# Patient Record
Sex: Female | Born: 1945 | Race: White | Hispanic: No | Marital: Single | Smoking: Former smoker
Health system: Southern US, Community
[De-identification: ages and names within clinical notes are randomized; demographics above are authoritative.]

## PROBLEM LIST (undated history)

## (undated) DIAGNOSIS — I2510 Atherosclerotic heart disease of native coronary artery without angina pectoris: Secondary | ICD-10-CM

## (undated) DIAGNOSIS — J449 Chronic obstructive pulmonary disease, unspecified: Secondary | ICD-10-CM

## (undated) DIAGNOSIS — R011 Cardiac murmur, unspecified: Secondary | ICD-10-CM

## (undated) DIAGNOSIS — I639 Cerebral infarction, unspecified: Secondary | ICD-10-CM

## (undated) DIAGNOSIS — I509 Heart failure, unspecified: Secondary | ICD-10-CM

## (undated) DIAGNOSIS — I10 Essential (primary) hypertension: Secondary | ICD-10-CM

## (undated) DIAGNOSIS — I499 Cardiac arrhythmia, unspecified: Secondary | ICD-10-CM

## (undated) DIAGNOSIS — Z87442 Personal history of urinary calculi: Secondary | ICD-10-CM

## (undated) DIAGNOSIS — E785 Hyperlipidemia, unspecified: Secondary | ICD-10-CM

## (undated) HISTORY — DX: Heart failure, unspecified: I50.9

## (undated) HISTORY — DX: Essential (primary) hypertension: I10

---

## 1987-02-20 HISTORY — PX: CORONARY ARTERY BYPASS GRAFT: SHX141

## 2020-10-20 DIAGNOSIS — I219 Acute myocardial infarction, unspecified: Secondary | ICD-10-CM

## 2020-10-20 HISTORY — DX: Acute myocardial infarction, unspecified: I21.9

## 2020-10-27 ENCOUNTER — Emergency Department: Payer: Medicare HMO

## 2020-10-27 ENCOUNTER — Inpatient Hospital Stay
Admission: EM | Admit: 2020-10-27 | Discharge: 2020-10-30 | DRG: 247 | Disposition: A | Discharge status: Home / Self Care | Payer: Medicare HMO | Attending: Internal Medicine | Admitting: Internal Medicine

## 2020-10-27 ENCOUNTER — Other Ambulatory Visit: Payer: Self-pay

## 2020-10-27 DIAGNOSIS — I6381 Other cerebral infarction due to occlusion or stenosis of small artery: Secondary | ICD-10-CM | POA: Diagnosis not present

## 2020-10-27 DIAGNOSIS — Z7982 Long term (current) use of aspirin: Secondary | ICD-10-CM

## 2020-10-27 DIAGNOSIS — I639 Cerebral infarction, unspecified: Secondary | ICD-10-CM

## 2020-10-27 DIAGNOSIS — Z885 Allergy status to narcotic agent status: Secondary | ICD-10-CM

## 2020-10-27 DIAGNOSIS — Z8673 Personal history of transient ischemic attack (TIA), and cerebral infarction without residual deficits: Secondary | ICD-10-CM

## 2020-10-27 DIAGNOSIS — E876 Hypokalemia: Secondary | ICD-10-CM | POA: Diagnosis not present

## 2020-10-27 DIAGNOSIS — Z951 Presence of aortocoronary bypass graft: Secondary | ICD-10-CM

## 2020-10-27 DIAGNOSIS — E785 Hyperlipidemia, unspecified: Secondary | ICD-10-CM | POA: Diagnosis present

## 2020-10-27 DIAGNOSIS — I10 Essential (primary) hypertension: Secondary | ICD-10-CM | POA: Diagnosis present

## 2020-10-27 DIAGNOSIS — R911 Solitary pulmonary nodule: Secondary | ICD-10-CM | POA: Diagnosis present

## 2020-10-27 DIAGNOSIS — N649 Disorder of breast, unspecified: Secondary | ICD-10-CM | POA: Diagnosis present

## 2020-10-27 DIAGNOSIS — N390 Urinary tract infection, site not specified: Secondary | ICD-10-CM | POA: Diagnosis present

## 2020-10-27 DIAGNOSIS — I249 Acute ischemic heart disease, unspecified: Secondary | ICD-10-CM | POA: Diagnosis not present

## 2020-10-27 DIAGNOSIS — I214 Non-ST elevation (NSTEMI) myocardial infarction: Principal | ICD-10-CM | POA: Diagnosis present

## 2020-10-27 DIAGNOSIS — Z87891 Personal history of nicotine dependence: Secondary | ICD-10-CM | POA: Diagnosis not present

## 2020-10-27 DIAGNOSIS — R739 Hyperglycemia, unspecified: Secondary | ICD-10-CM | POA: Diagnosis present

## 2020-10-27 DIAGNOSIS — I517 Cardiomegaly: Secondary | ICD-10-CM | POA: Diagnosis not present

## 2020-10-27 DIAGNOSIS — J432 Centrilobular emphysema: Secondary | ICD-10-CM | POA: Diagnosis not present

## 2020-10-27 DIAGNOSIS — I081 Rheumatic disorders of both mitral and tricuspid valves: Secondary | ICD-10-CM | POA: Diagnosis not present

## 2020-10-27 DIAGNOSIS — Z20822 Contact with and (suspected) exposure to covid-19: Secondary | ICD-10-CM | POA: Diagnosis present

## 2020-10-27 DIAGNOSIS — R079 Chest pain, unspecified: Secondary | ICD-10-CM | POA: Diagnosis not present

## 2020-10-27 DIAGNOSIS — I5189 Other ill-defined heart diseases: Secondary | ICD-10-CM | POA: Diagnosis not present

## 2020-10-27 DIAGNOSIS — E7800 Pure hypercholesterolemia, unspecified: Secondary | ICD-10-CM | POA: Diagnosis not present

## 2020-10-27 HISTORY — DX: Hyperlipidemia, unspecified: E78.5

## 2020-10-27 HISTORY — DX: Cerebral infarction, unspecified: I63.9

## 2020-10-27 HISTORY — DX: Atherosclerotic heart disease of native coronary artery without angina pectoris: I25.10

## 2020-10-27 HISTORY — DX: Chronic obstructive pulmonary disease, unspecified: J44.9

## 2020-10-27 LAB — URINALYSIS, COMPLETE (UACMP) WITH MICROSCOPIC
Glucose, UA: NEGATIVE mg/dL
Ketones, ur: 15 mg/dL — AB
Nitrite: NEGATIVE
Protein, ur: 100 mg/dL — AB
Specific Gravity, Urine: 1.03 (ref 1.005–1.030)
WBC, UA: >50 WBC/hpf — ABNORMAL HIGH (ref 0–5)
pH: 6 (ref 5.0–8.0)

## 2020-10-27 LAB — COMPREHENSIVE METABOLIC PANEL
ALT: 35 U/L (ref 0–44)
AST: 75 U/L — ABNORMAL HIGH (ref 15–41)
Albumin: 4.2 g/dL (ref 3.5–5.0)
Alkaline Phosphatase: 117 U/L (ref 38–126)
Anion gap: 13 (ref 5–15)
BUN: 16 mg/dL (ref 8–23)
CO2: 21 mmol/L — ABNORMAL LOW (ref 22–32)
Calcium: 9.2 mg/dL (ref 8.9–10.3)
Chloride: 98 mmol/L (ref 98–111)
Creatinine, Ser: 0.73 mg/dL (ref 0.44–1.00)
GFR, Estimated: >60 mL/min (ref >60)
Glucose, Bld: 199 mg/dL — ABNORMAL HIGH (ref 70–99)
Potassium: 3.7 mmol/L (ref 3.5–5.1)
Sodium: 132 mmol/L — ABNORMAL LOW (ref 135–145)
Total Bilirubin: 1.7 mg/dL — ABNORMAL HIGH (ref 0.3–1.2)
Total Protein: 7.7 g/dL (ref 6.5–8.1)

## 2020-10-27 LAB — CBC
HCT: 44.5 % (ref 36.0–46.0)
Hemoglobin: 15.3 g/dL — ABNORMAL HIGH (ref 12.0–15.0)
MCH: 30.4 pg (ref 26.0–34.0)
MCHC: 34.4 g/dL (ref 30.0–36.0)
MCV: 88.3 fL (ref 80.0–100.0)
Platelets: 386 10*3/uL (ref 150–400)
RBC: 5.04 MIL/uL (ref 3.87–5.11)
RDW: 12.8 % (ref 11.5–15.5)
WBC: 16.1 10*3/uL — ABNORMAL HIGH (ref 4.0–10.5)
nRBC: 0 % (ref 0.0–0.2)

## 2020-10-27 LAB — GLUCOSE, CAPILLARY: Glucose-Capillary: 126 mg/dL — ABNORMAL HIGH (ref 70–99)

## 2020-10-27 LAB — D-DIMER, QUANTITATIVE: D-Dimer, Quant: 0.98 ug/mL-FEU — ABNORMAL HIGH (ref 0.00–0.50)

## 2020-10-27 LAB — TROPONIN I (HIGH SENSITIVITY)
Troponin I (High Sensitivity): 12616 ng/L (ref <18)
Troponin I (High Sensitivity): 12495 ng/L (ref <18)

## 2020-10-27 LAB — APTT: aPTT: 142 seconds — ABNORMAL HIGH (ref 24–36)

## 2020-10-27 LAB — LIPID PANEL
Cholesterol: 270 mg/dL — ABNORMAL HIGH (ref 0–200)
HDL: 47 mg/dL (ref >40)
LDL Cholesterol: 199 mg/dL — ABNORMAL HIGH (ref 0–99)
Total CHOL/HDL Ratio: 5.7 RATIO
Triglycerides: 120 mg/dL (ref <150)
VLDL: 24 mg/dL (ref 0–40)

## 2020-10-27 LAB — PROTIME-INR
INR: 1.1 (ref 0.8–1.2)
Prothrombin Time: 14 seconds (ref 11.4–15.2)

## 2020-10-27 MED ORDER — HEPARIN (PORCINE) 25000 UT/250ML-% IV SOLN: 700.0000 [IU]/h | INTRAVENOUS | Status: DC

## 2020-10-27 MED ORDER — SODIUM CHLORIDE 0.9 % IV SOLN
1.0000 g | INTRAVENOUS | Status: DC
  Administered 2020-10-27 – 2020-10-29 (×3): 1 g via INTRAVENOUS
  Filled 2020-10-27 (×4): qty 10

## 2020-10-27 MED ORDER — HEPARIN BOLUS VIA INFUSION
3600.0000 [IU] | Freq: Once | INTRAVENOUS | Status: AC
  Administered 2020-10-27: 3600 [IU] via INTRAVENOUS
  Filled 2020-10-27: qty 3600

## 2020-10-27 MED ORDER — INSULIN ASPART 100 UNIT/ML IJ SOLN
0.0000 [IU] | Freq: Three times a day (TID) | INTRAMUSCULAR | Status: DC
  Administered 2020-10-30: 1 [IU] via SUBCUTANEOUS
  Filled 2020-10-27: qty 1

## 2020-10-27 MED ORDER — IOHEXOL 350 MG/ML SOLN
100.0000 mL | Freq: Once | INTRAVENOUS | Status: AC | PRN
  Administered 2020-10-27: 100 mL via INTRAVENOUS

## 2020-10-27 MED ORDER — ATORVASTATIN CALCIUM 20 MG PO TABS
40.0000 mg | ORAL_TABLET | Freq: Every day | ORAL | Status: DC
  Administered 2020-10-27 – 2020-10-28 (×2): 40 mg via ORAL
  Filled 2020-10-27 (×2): qty 2

## 2020-10-27 MED ORDER — ASPIRIN 81 MG PO CHEW
324.0000 mg | CHEWABLE_TABLET | Freq: Once | ORAL | Status: AC
  Administered 2020-10-27: 324 mg via ORAL
  Filled 2020-10-27: qty 4

## 2020-10-27 MED ORDER — SODIUM CHLORIDE 0.9 % IV SOLN
INTRAVENOUS | Status: DC | PRN
  Administered 2020-10-27: 250 mL via INTRAVENOUS

## 2020-10-27 MED ORDER — HEPARIN BOLUS VIA INFUSION
3600.0000 [IU] | Freq: Once | INTRAVENOUS | Status: DC
  Filled 2020-10-27: qty 3600

## 2020-10-27 MED ORDER — ASPIRIN 81 MG PO CHEW
81.0000 mg | CHEWABLE_TABLET | Freq: Every day | ORAL | Status: DC
  Administered 2020-10-28: 81 mg via ORAL
  Filled 2020-10-27 (×2): qty 1

## 2020-10-27 MED ORDER — HEPARIN (PORCINE) 25000 UT/250ML-% IV SOLN
850.0000 [IU]/h | INTRAVENOUS | Status: DC
  Administered 2020-10-27: 700 [IU]/h via INTRAVENOUS
  Filled 2020-10-27: qty 250

## 2020-10-27 NOTE — ED Provider Notes (Signed)
[facility] Emergency Department Provider Note ____________________________________________   Event Date/Time   First MD Initiated Contact with Patient 10/27/20 1620     (approximate)  I have reviewed the triage vital signs and the nursing notes.  HISTORY  Chief Complaint Chest pain fatigue  HPI Penny Mccullough is a 75 y.o. 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  But she has noticed since then she has been feeling rather weak.  She is also for several days now had burning with urination and feels like she has a urinary tract infection  No shortness of breath no nausea or vomiting.  She reports the pain was severe in her middle chest lasted for several hours and then went away.  Reports she did not sleep well on Monday night because of the pain.  She is not having any further pain since Monday no pain or discomfort now except discomfort in with urination only  She does take a baby aspirin occasionally  The pain did not radiate in her chest on Monday it was substernal nature was not of a ripping tearing or moving sensation History reviewed. No pertinent past medical history.  There are no problems to display for this patient.     Prior to Admission medications   Not on File    Allergies Patient has no known allergies.  No family history on file.  Social History    Review of Systems Constitutional: No fever/chills Eyes: No visual changes. ENT: No sore throat. Cardiovascular: See HPI Respiratory: Denies shortness of breath. Gastrointestinal: No abdominal pain.   Genitourinary: Burning with urination for a couple days Musculoskeletal: Negative for back pain. Skin: Negative for rash. Neurological: Negative for headaches, areas of focal weakness or  numbness.    ____________________________________________   PHYSICAL EXAM:  VITAL SIGNS: ED Triage Vitals  Enc Vitals Group     BP 10/27/20 1252 (!) 149/95     Pulse Rate 10/27/20 1252 94     Resp 10/27/20 1252 18     Temp 10/27/20 1252 98.2 F (36.8 C)     Temp Source 10/27/20 1252 Oral     SpO2 10/27/20 1252 98 %     Weight 10/27/20 1254 135 lb (61.2 kg)     Height 10/27/20 1254 5\' 1"  (1.549 m)     Head Circumference --      Peak Flow --      Pain Score 10/27/20 1254 0     Pain Loc --      Pain Edu? --      Excl. in GC? --     Constitutional: Alert and oriented. Well appearing and in no acute distress. Eyes: Conjunctivae are normal. Head: Atraumatic. Nose: No congestion/rhinnorhea. Mouth/Throat: Mucous membranes are moist. Neck: No stridor.  Cardiovascular: Normal rate, regular rhythm. Grossly normal heart sounds.  Good peripheral circulation. Respiratory: Normal respiratory effort.  No retractions. Lungs CTAB. Gastrointestinal: Soft and nontender. No distention. Musculoskeletal: No lower extremity tenderness nor edema. Neurologic:  Normal speech and language. No gross focal neurologic deficits are appreciated.  Skin:  Skin is warm, dry and intact. No rash noted. Psychiatric: Mood and affect are normal. Speech and behavior are normal.  ____________________________________________   LABS (all labs ordered are listed, but only abnormal results are  displayed)  Labs Reviewed  COMPREHENSIVE METABOLIC PANEL - Abnormal; Notable for the following components:      Result Value   Sodium 132 (*)    CO2 21 (*)    Glucose, Bld 199 (*)    AST 75 (*)    Total Bilirubin 1.7 (*)    All other components within normal limits  CBC - Abnormal; Notable for the following components:   WBC 16.1 (*)    Hemoglobin 15.3 (*)    All other components within normal limits  URINALYSIS, COMPLETE (UACMP) WITH MICROSCOPIC - Abnormal; Notable for the following components:   Color, Urine  YELLOW (*)    APPearance TURBID (*)    Hgb urine dipstick MODERATE (*)    Bilirubin Urine SMALL (*)    Ketones, ur 15 (*)    Protein, ur 100 (*)    Leukocytes,Ua SMALL (*)    WBC, UA >50 (*)    Bacteria, UA MANY (*)    All other components within normal limits  D-DIMER, QUANTITATIVE - Abnormal; Notable for the following components:   D-Dimer, Quant 0.98 (*)    All other components within normal limits  TROPONIN I (HIGH SENSITIVITY) - Abnormal; Notable for the following components:   Troponin I (High Sensitivity) 12,616 (*)    All other components within normal limits  SARS CORONAVIRUS 2 (TAT 6-24 HRS)  CBG MONITORING, ED   ____________________________________________  EKG  Is reviewed inter by me at 1300 Heart rate 95 QRS 89 QTc 470 Sinus rhythm, notable ST segment abnormality including mild ST elevation in lead II, slight convex appearance in aVF and mild depression in lead III in total she has multiple leads concerning for possible ischemic abnormality.  No STEMI.  In particular with her chest pain being on Monday with resolution I have a high concern that the patient may have had an acute episode of ACS on Monday but her chest pain is now resolved.  EKG appears to demonstrate concerns for potential cardiac ischemia or acute cardiac abnormality but history is not consistent with active STEMI at this time ____________________________________________  RADIOLOGY  DG Chest 2 View  Result Date: 10/27/2020 CLINICAL DATA:  Chest pain abnormal EKG EXAM: CHEST - 2 VIEW COMPARISON:  None. FINDINGS: Post sternotomy changes. Mild diffuse bronchitic changes. No pleural effusion. Patchy lingular and right middle lobe opacity. Cardiac size within normal limits. Aortic atherosclerosis. No pneumothorax IMPRESSION: 1. Patchy lingular and right middle lobe opacity which may be due to atelectasis, scar, or minimal infiltrate. Electronically Signed   By: Jasmine Pang M.D.   On: 10/27/2020 18:21   CT  HEAD WO CONTRAST ( )  Result Date: 10/27/2020 CLINICAL DATA:  Mental status change. EXAM: CT HEAD WITHOUT CONTRAST TECHNIQUE: Contiguous axial images were obtained from the base of the skull through the vertex without intravenous contrast. COMPARISON:  None. FINDINGS: Brain: Within the left basal ganglia there is a focal area of low attenuation measuring 1 cm, image 14/3. This is consistent with a subacute to chronic lacunar infarct. No signs of acute intracranial hemorrhage, mass or abnormal extra-axial fluid collection. Vascular: No hyperdense vessel or unexpected calcification. Skull: Normal. Negative for fracture or focal lesion. Sinuses/Orbits: No acute finding. Other: None. IMPRESSION: 1. No acute intracranial abnormalities. 2. 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 Electronically Signed   By: Signa Kell M.D.   On: 10/27/2020 14:14   CT ANGIO CHEST AORTA W/CM & OR WO/CM  Result Date: 10/27/2020 CLINICAL DATA:  Chest pain or back pain, aortic dissection suspected eval for dissection or PE Altered mental status and chest pain. EXAM: CT ANGIOGRAPHY CHEST WITH CONTRAST TECHNIQUE: Noncontrast CT of the chest was obtained initially. Multidetector CT imaging of the chest was performed using the standard protocol during bolus administration of intravenous contrast. Multiplanar CT image reconstructions and MIPs were obtained to evaluate the vascular anatomy. CONTRAST:  OMNIPAQUE IOHEXOL 350 MG/ML SOLN COMPARISON:  Radiograph earlier today. FINDINGS: Cardiovascular: No evidence of high-density aortic hematoma on noncontrast chest CT. There is moderate atherosclerosis. No aneurysm. After the administration of IV contrast there is no evidence of dissection or acute aortic findings. Atherosclerotic plaque which is both calcified and noncalcified, including irregular plaque in the distal descending aorta. Conventional branch pattern from the aortic arch. Atherosclerotic plaque causes short  segment approximately 50% luminal narrowing of the left subclavian artery, series 5, image 15. excellent evaluation for pulmonary embolus. There are no filling defects within the pulmonary arteries to suggest pulmonary embolus. Heart is normal in size. There are coronary artery calcifications. No pericardial effusion. Mediastinum/Nodes: No enlarged mediastinal or hilar lymph nodes. Small hiatal hernia. No esophageal wall thickening. No visualized thyroid nodule. Left thyroid lobe is slightly atrophic. Lungs/Pleura: Mild to moderate emphysema. Mild central bronchial wall thickening. Bandlike opacities in the anterior right middle lobe and lingula are typical of chronic atelectasis. No confluent airspace disease or pneumonia. There is a 3 mm right upper lobe pulmonary nodule, series 6, image 32. No dominant pulmonary mass. No pleural fluid. No findings of pulmonary edema. Upper Abdomen: No acute or unexpected finding.  Small hiatal hernia. Musculoskeletal: Thoracic spondylosis with disc space narrowing and endplate spurring. Post median sternotomy. There is a 2.6 cm lobulated left breast lesion with single peripheral calcification in the inferior breast. Review of the MIP images confirms the above findings. IMPRESSION: 1. No aortic dissection or acute aortic findings. No pulmonary embolus. 2. Moderate emphysema with mild central bronchial wall thickening. 3. Bandlike opacities in the lingula and right middle lobe typical of chronic atelectasis. 4. Coronary artery calcifications. 5. Lobulated 2.6 cm left breast lesion with single peripheral calcification. Recommend up-to-date mammography. 6. Right upper lobe 3 mm pulmonary nodule. No follow-up needed if patient is low-risk. Non-contrast chest CT can be considered in 12 months if patient is high-risk. This recommendation follows the consensus statement: Guidelines for Management of Incidental Pulmonary Nodules Detected on CT Images: From the Fleischner Society 2017;  Radiology 2017; 284:228-243. 7. Small hiatal hernia. 8. Short segment approximately 50% luminal narrowing of the left subclavian due to atheromatous plaque. Aortic Atherosclerosis (ICD10-I70.0) and Emphysema (ICD10-J43.9). Electronically Signed   By: Narda Rutherford M.D.   On: 10/27/2020 19:41      ____________________________________________   PROCEDURES  Procedure(s) performed: None  Procedures  Critical Care performed: Yes, see critical care note(s)  CRITICAL CARE Performed by: Sharyn Creamer   Total critical care time: 45 minutes  Critical care time was exclusive of separately billable procedures and treating other patients.  Critical care was necessary to treat or prevent imminent or life-threatening deterioration.  Critical care was time spent personally by me on the following activities: development of treatment plan with patient and/or surrogate as well as nursing, discussions with consultants, evaluation of patient's response to treatment, examination of patient, obtaining history from patient or surrogate, ordering and performing treatments and interventions, ordering and review of laboratory studies, ordering and review of radiographic studies, pulse oximetry and re-evaluation of patient's condition.  ____________________________________________  INITIAL IMPRESSION / ASSESSMENT AND PLAN / ED COURSE  Pertinent labs & imaging results that were available during my care of the patient were reviewed by me and considered in my medical decision making (see chart for details).   Differential diagnosis includes, but is not limited to, ACS, aortic dissection, pulmonary embolism, cardiac tamponade, pneumothorax, pneumonia, pericarditis, myocarditis, GI-related causes including esophagitis/gastritis, and musculoskeletal chest wall pain.    No history of DVT.  No recent surgeries.  No leg swelling.  Wells criteria low risk for PE   Dysuria, abdominal exam reassuring.  No fever  nontoxic in appearance.  No evidence of sepsis.  Urinalysis pending.  Patient had a head CT as well that demonstrates possible subacute to chronic infarct.  No hemorrhage.  I do not see clear clinical evidence by clinical history or exam to suggest subacute to acute stroke.  I suspect this is likely chronic in nature based on clinical history, but certainly patient will be admitted for further care and management    ----------------------------------------- 8:13 PM on 10/27/2020 -----------------------------------------  Patient and admitted to hospitalist service Dr. Cyndia Bent.  She remains hemodynamically stable pain-free.  CT angiography negative for dissection.  Initiating heparin has received salicylates and have discussed and cardiology consultation formally be provided by Dr. Graciela Husbands.  Patient understanding agreeable with plan for admission  ____________________________________________   FINAL CLINICAL IMPRESSION(S) / ED DIAGNOSES  Final diagnoses:  ACS (acute coronary syndrome) (HCC)  Breast lesion  Lung nodule  Lacunar infarct (favored chronic based on presentation today)      Note:  This document was prepared using Dragon voice recognition software and may include unintentional dictation errors       Sharyn Creamer, MD 10/27/20 2014

## 2020-10-27 NOTE — ED Notes (Signed)
Informed RN bed assigned 

## 2020-10-27 NOTE — ED Notes (Signed)
Pt presents to ED with c/o of not feeling well. Pt states her family brought her in because she wasn't acting herself.   Pt states she had chest pain that started Monday and went away and then came back Wednesday and went away again and pt states it never came back. Pt is currently A&Ox4. Pt denies chest pain or SOB to this RN.   Pt states a HX of "heart attack" but cant tell this RN when.

## 2020-10-27 NOTE — H&P (Addendum)
History and Physical    Penny Mccullough ZJQ:[id number] DOB: 1945-10-23 DOA: 10/27/2020  PCP: Pcp, No  Patient coming from: Home, lives with granddaughters  I have personally briefly reviewed patient's old medical records in [facility]  Chief Complaint: weakness, fatigue  HPI: Penny Mccullough is a 75 y.o. female with medical history significant for history of remote bypass x1 in the 90s who presents with concerns of weakness and fatigue.  Reports that about 3 days ago she came back home from a funeral and was sitting down during feels when she suddenly felt midsternal chest pain.  It lasted for several minutes and improved when she laid down to rest.  Then she had several episodes of nausea and vomiting.  Since then she has felt weak and fatigued.  Sleeping more during the day which is atypical for her. Denies any headache or dizziness.  Friend noticed increasing shortness of breath when she ambulated today. Does not feel more stressed. Her family decided to bring her to the ER today.  Her daughter and friend is at bedside.  Patient reports she has not seen a doctor since her bypass about 30 years ago.  She has history of remote tobacco use quit about 30 years ago.  No alcohol or illicit drug use.  Denies any ongoing chest pain.  She also mentioned that about several days ago she noted dysuria that went away but again returned today.  ED Course: She was afebrile and normotensive on room air.  Troponin significantly elevated at 12,616.  EKG with possible inferior ischemia.  WBC of 16.  Hemoglobin of 15.  Sodium 132.  Blood glucose of 199.  Mildly elevated total bili 1.7. Mildly elevated D-dimer of 0.98.  CT aortic chest showed no PE or dissection but had incidental finding of left breast lesion. 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  COVID PCR test pending  Review of Systems:  Constitutional: No Weight Change, No Fever ENT/Mouth: No sore throat, No  Rhinorrhea Eyes: No Eye Pain, No Vision Changes Cardiovascular: + Chest Pain, + SOB, No Edema Respiratory: No Cough, No Sputum, No Wheezing, no Dyspnea  Gastrointestinal: + Nausea, + Vomiting, No Diarrhea, No Constipation, No Pain Genitourinary: no Urinary Incontinence, No Urgency, No Flank Pain Musculoskeletal: No Arthralgias, No Myalgias Skin: No Skin Lesions, No Pruritus, Neuro: + Weakness, No Numbness Psych: No Anxiety/Panic, No Depression, no decrease appetite Heme/Lymph: No Bruising, No Bleeding  Surgical history CABG about 30 years ago  Social History Past tobacco use about 30 years ago.  No alcohol or illicit drug use. Lives with grand-daughter    Prior to Admission medications   Medication Sig Start Date End Date Taking? Authorizing Provider  aspirin 81 MG EC tablet Take 81 mg by mouth daily.   Yes [provider]    Physical Exam: Vitals:   10/27/20 1830 10/27/20 2000 10/27/20 2015 10/27/20 2030  BP: (!) 143/91 (!) 147/87    Pulse: 79  74 73  Resp: 20 18 (!) 27 16  Temp:    98 F (36.7 C)  TempSrc:    Oral  SpO2: 96%  98% 99%  Weight:      Height:        Constitutional: NAD, calm, comfortable, elderly female lying flat in bed Vitals:   10/27/20 1830 10/27/20 2000 10/27/20 2015 10/27/20 2030  BP: (!) 143/91 (!) 147/87    Pulse: 79  74 73  Resp: 20 18 (!) 27  16  Temp:    98 F (36.7 C)  TempSrc:    Oral  SpO2: 96%  98% 99%  Weight:      Height:       Eyes: PERRL, lids and conjunctivae normal ENMT: Mucous membranes are moist.  Neck: normal, supple Respiratory: clear to auscultation bilaterally, no wheezing, no crackles. Normal respiratory effort. No accessory muscle use.  Cardiovascular: Regular rate and rhythm, no murmurs / rubs / gallops. No extremity edema.  Abdomen: no tenderness, no masses palpated.  Bowel sounds positive.  Musculoskeletal: no clubbing / cyanosis. No joint deformity upper and lower extremities. Good ROM, no contractures.  Normal muscle tone.  Skin: no rashes, lesions, ulcers. No induration Neurologic: CN 2-12 grossly intact. Sensation intact,  Strength 5/5 in all 4.  Psychiatric: Normal judgment and insight. Alert and oriented x 3. Normal mood.    Labs on Admission: I have personally reviewed following labs and imaging studies  CBC: Recent Labs  Lab 10/27/20 1256  WBC 16.1*  HGB 15.3*  HCT 44.5  MCV 88.3  PLT 386   Basic Metabolic Panel: Recent Labs  Lab 10/27/20 1256  NA 132*  K 3.7  CL 98  CO2 21*  GLUCOSE 199*  BUN 16  CREATININE 0.73  CALCIUM 9.2   GFR: Estimated Creatinine Clearance: 52.6 mL/min (by C-G formula based on SCr of 0.73 mg/dL). Liver Function Tests: Recent Labs  Lab 10/27/20 1256  AST 75*  ALT 35  ALKPHOS 117  BILITOT 1.7*  PROT 7.7  ALBUMIN 4.2   No results for input(s): LIPASE, AMYLASE in the last 168 hours. No results for input(s): AMMONIA in the last 168 hours. Coagulation Profile: No results for input(s): INR, PROTIME in the last 168 hours. Cardiac Enzymes: No results for input(s): CKTOTAL, CKMB, CKMBINDEX, TROPONINI in the last 168 hours. BNP (last 3 results) No results for input(s): PROBNP in the last 8760 hours. HbA1C: No results for input(s): HGBA1C in the last 72 hours. CBG: No results for input(s): GLUCAP in the last 168 hours. Lipid Profile: No results for input(s): CHOL, HDL, LDLCALC, TRIG, CHOLHDL, LDLDIRECT in the last 72 hours. Thyroid Function Tests: No results for input(s): TSH, T4TOTAL, FREET4, T3FREE, THYROIDAB in the last 72 hours. Anemia Panel: No results for input(s): VITAMINB12, FOLATE, FERRITIN, TIBC, IRON, RETICCTPCT in the last 72 hours. Urine analysis:    Component Value Date/Time   COLORURINE YELLOW (A) 10/27/2020 1256   APPEARANCEUR TURBID (A) 10/27/2020 1256   LABSPEC 1.030 10/27/2020 1256   PHURINE 6.0 10/27/2020 1256   GLUCOSEU NEGATIVE 10/27/2020 1256   HGBUR MODERATE (A) 10/27/2020 1256   BILIRUBINUR SMALL (A)  10/27/2020 1256   KETONESUR 15 (A) 10/27/2020 1256   PROTEINUR 100 (A) 10/27/2020 1256   NITRITE NEGATIVE 10/27/2020 1256   LEUKOCYTESUR SMALL (A) 10/27/2020 1256    Radiological Exams on Admission: DG Chest 2 View  Result Date: 10/27/2020 CLINICAL DATA:  Chest pain abnormal EKG EXAM: CHEST - 2 VIEW COMPARISON:  None. FINDINGS: Post sternotomy changes. Mild diffuse bronchitic changes. No pleural effusion. Patchy lingular and right middle lobe opacity. Cardiac size within normal limits. Aortic atherosclerosis. No pneumothorax IMPRESSION: 1. Patchy lingular and right middle lobe opacity which may be due to atelectasis, scar, or minimal infiltrate. Electronically Signed   By: Jasmine Pang M.D.   On: 10/27/2020 18:21   CT HEAD WO CONTRAST ( )  Result Date: 10/27/2020 CLINICAL DATA:  Mental status change. EXAM: CT HEAD WITHOUT CONTRAST TECHNIQUE: Contiguous axial  images were obtained from the base of the skull through the vertex without intravenous contrast. COMPARISON:  None. FINDINGS: Brain: Within the left basal ganglia there is a focal area of low attenuation measuring 1 cm, image 14/3. This is consistent with a subacute to chronic lacunar infarct. No signs of acute intracranial hemorrhage, mass or abnormal extra-axial fluid collection. Vascular: No hyperdense vessel or unexpected calcification. Skull: Normal. Negative for fracture or focal lesion. Sinuses/Orbits: No acute finding. Other: None. IMPRESSION: 1. No acute intracranial abnormalities. 2. 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 Electronically Signed   By: Signa Kell M.D.   On: 10/27/2020 14:14   CT ANGIO CHEST AORTA W/CM & OR WO/CM  Result Date: 10/27/2020 CLINICAL DATA:  Chest pain or back pain, aortic dissection suspected eval for dissection or PE Altered mental status and chest pain. EXAM: CT ANGIOGRAPHY CHEST WITH CONTRAST TECHNIQUE: Noncontrast CT of the chest was obtained initially. Multidetector CT imaging of the  chest was performed using the standard protocol during bolus administration of intravenous contrast. Multiplanar CT image reconstructions and MIPs were obtained to evaluate the vascular anatomy. CONTRAST:  OMNIPAQUE IOHEXOL 350 MG/ML SOLN COMPARISON:  Radiograph earlier today. FINDINGS: Cardiovascular: No evidence of high-density aortic hematoma on noncontrast chest CT. There is moderate atherosclerosis. No aneurysm. After the administration of IV contrast there is no evidence of dissection or acute aortic findings. Atherosclerotic plaque which is both calcified and noncalcified, including irregular plaque in the distal descending aorta. Conventional branch pattern from the aortic arch. Atherosclerotic plaque causes short segment approximately 50% luminal narrowing of the left subclavian artery, series 5, image 15. excellent evaluation for pulmonary embolus. There are no filling defects within the pulmonary arteries to suggest pulmonary embolus. Heart is normal in size. There are coronary artery calcifications. No pericardial effusion. Mediastinum/Nodes: No enlarged mediastinal or hilar lymph nodes. Small hiatal hernia. No esophageal wall thickening. No visualized thyroid nodule. Left thyroid lobe is slightly atrophic. Lungs/Pleura: Mild to moderate emphysema. Mild central bronchial wall thickening. Bandlike opacities in the anterior right middle lobe and lingula are typical of chronic atelectasis. No confluent airspace disease or pneumonia. There is a 3 mm right upper lobe pulmonary nodule, series 6, image 32. No dominant pulmonary mass. No pleural fluid. No findings of pulmonary edema. Upper Abdomen: No acute or unexpected finding.  Small hiatal hernia. Musculoskeletal: Thoracic spondylosis with disc space narrowing and endplate spurring. Post median sternotomy. There is a 2.6 cm lobulated left breast lesion with single peripheral calcification in the inferior breast. Review of the MIP images confirms the  above findings. IMPRESSION: 1. No aortic dissection or acute aortic findings. No pulmonary embolus. 2. Moderate emphysema with mild central bronchial wall thickening. 3. Bandlike opacities in the lingula and right middle lobe typical of chronic atelectasis. 4. Coronary artery calcifications. 5. Lobulated 2.6 cm left breast lesion with single peripheral calcification. Recommend up-to-date mammography. 6. Right upper lobe 3 mm pulmonary nodule. No follow-up needed if patient is low-risk. Non-contrast chest CT can be considered in 12 months if patient is high-risk. This recommendation follows the consensus statement: Guidelines for Management of Incidental Pulmonary Nodules Detected on CT Images: From the Fleischner Society 2017; Radiology 2017; 284:228-243. 7. Small hiatal hernia. 8. Short segment approximately 50% luminal narrowing of the left subclavian due to atheromatous plaque. Aortic Atherosclerosis (ICD10-I70.0) and Emphysema (ICD10-J43.9). Electronically Signed   By: Narda Rutherford M.D.   On: 10/27/2020 19:41      Assessment/Plan  NSTEMI Has history  of remote CABG Troponin significantly elevated at 12,616.  Continue to trend. EKG concerning for inferior ischemia Continue IV heparin infusion Cardiology is aware and will see in consultation tomorrow Check lipid panel and hemoglobin A1c   UTI -UA positive for small leukocyte and many bacteria.  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  Recommends outpatient mammogram  Right upper lobe nodule - 3 mm nodule.  Patient has low risk with last tobacco use 30+ years ago.  No further imaging recommended.  DVT prophylaxis:.IV heparin infusion Code Status: Full Family  Communication: Plan discussed with patient at bedside  disposition Plan: Home with at least 2 midnight stays  Consults called:  Admission status: inpatient  Level of care: Progressive Cardiac  Status is: Inpatient  Remains inpatient appropriate because:Inpatient level of care appropriate due to severity of illness  Dispo: The patient is from: Home              Anticipated d/c is to: Home              Patient currently is not medically stable to d/c.   Difficult to place patient No         Anselm Jungling DO [facility]   If 7PM-7AM, please contact night-coverage [email or web address]   10/27/2020, 8:58 PM

## 2020-10-27 NOTE — ED Notes (Signed)
Pt a@ CT

## 2020-10-27 NOTE — ED Notes (Signed)
Pt at XRAY

## 2020-10-27 NOTE — ED Notes (Signed)
Admitting provider at bedside.

## 2020-10-27 NOTE — Progress Notes (Addendum)
ANTICOAGULATION CONSULT NOTE  Pharmacy Consult for IV heparin Indication: chest pain/ACS  No Known Allergies  Patient Measurements: Height: 5\' 1"  (154.9 cm) Weight: 61.2 kg (135 lb) IBW/kg (Calculated) : 47.8 Heparin Dosing Weight: 61.2 kg  Vital Signs: Temp: 98.2 F (36.8 C) (09/08 1252) Temp Source: Oral (09/08 1252) BP: 157/92 (09/08 1721) Pulse Rate: 90 (09/08 1721)  Labs: Recent Labs    10/27/20 1256 10/27/20 1300  HGB 15.3*  --   HCT 44.5  --   PLT 386  --   CREATININE 0.73  --   TROPONINIHS  --  12,616*    Estimated Creatinine Clearance: 52.6 mL/min (by C-G formula based on SCr of 0.73 mg/dL).   Medical History: No past medical history on file.  Medications:  (Not in a hospital admission)  Scheduled:  Infusions:  PRN:  Anti-infectives (From admission, onward)    None       Assessment: 74YOF presenting with chest pain initially as well as confusion. Troponins significantly elevated. CT head without hemorrhage, CBC stable. Will proceed with IV heparin for ACS.   Goal of Therapy:  Heparin level 0.3-0.7 units/ml Monitor platelets by anticoagulation protocol: Yes   Plan:  Give 3600 units bolus x 1 Start heparin infusion at 700 units/hr Baseline aPTT and INR. Check anti-Xa level in 8 hours Daily CBC    Addendum: heparin consult discontinued. D/C'd orders  12/27/20, PharmD Pharmacy Resident  10/27/2020 5:56 PM

## 2020-10-27 NOTE — ED Triage Notes (Signed)
Pt here with CP and AMS. Pt states that she does not know what's going on but she knows that she does not feel good. Pt denies chest pain but states that she does not feel like herself.

## 2020-10-27 NOTE — Consult Note (Signed)
ANTICOAGULATION CONSULT NOTE - Initial Consult  Pharmacy Consult for IV heparin Indication: chest pain/ACS  No Known Allergies  Patient Measurements: Height: 5\' 1"  (154.9 cm) Weight: 61.2 kg (135 lb) IBW/kg (Calculated) : 47.8 Heparin Dosing Weight: 60.2kg  Vital Signs: Temp: 98.2 F (36.8 C) (09/08 1252) Temp Source: Oral (09/08 1252) BP: 143/91 (09/08 1830) Pulse Rate: 79 (09/08 1830)  Labs: Recent Labs    10/27/20 1256 10/27/20 1300  HGB 15.3*  --   HCT 44.5  --   PLT 386  --   CREATININE 0.73  --   TROPONINIHS  --  12,616*    Estimated Creatinine Clearance: 52.6 mL/min (by C-G formula based on SCr of 0.73 mg/dL).   Medical History: History reviewed. No pertinent past medical history. Patient reports a cardiac history for cardiac bypass in the 1980's  Medications:  (Not in a hospital admission)  No documented PTA meds except ASA EC 81mg  daily  Assessment: 74YOF presenting with chest pain initially as well as confusion. Troponins significantly elevated. CT head without hemorrhage, CBC stable. Will proceed with IV heparin for ACS.  Goal of Therapy:  Heparin level 0.3-0.7 units/ml Monitor platelets by anticoagulation protocol: Yes   Plan:  Give 3600 units bolus x 1 Start heparin infusion at 700 units/hr Baseline aPTT and INR. Check anti-Xa level in 8 hours Daily CBC  12/27/20, RPh 10/27/2020,8:02 PM

## 2020-10-27 NOTE — ED Notes (Signed)
Belongings given to family member including clothes and all jewelry, pt has cell phone. No charger at this time.

## 2020-10-28 ENCOUNTER — Encounter
Admission: EM | Disposition: A | Discharge status: Home / Self Care | Payer: Self-pay | Source: Home / Self Care | Attending: Internal Medicine

## 2020-10-28 ENCOUNTER — Other Ambulatory Visit: Payer: Self-pay

## 2020-10-28 ENCOUNTER — Inpatient Hospital Stay: Payer: Medicare HMO

## 2020-10-28 ENCOUNTER — Encounter: Payer: Self-pay | Admitting: Family Medicine

## 2020-10-28 DIAGNOSIS — E785 Hyperlipidemia, unspecified: Secondary | ICD-10-CM

## 2020-10-28 DIAGNOSIS — J432 Centrilobular emphysema: Secondary | ICD-10-CM

## 2020-10-28 DIAGNOSIS — I214 Non-ST elevation (NSTEMI) myocardial infarction: Secondary | ICD-10-CM | POA: Diagnosis not present

## 2020-10-28 DIAGNOSIS — I249 Acute ischemic heart disease, unspecified: Secondary | ICD-10-CM

## 2020-10-28 HISTORY — PX: LEFT HEART CATH AND CORONARY ANGIOGRAPHY: CATH118249

## 2020-10-28 HISTORY — PX: CORONARY STENT INTERVENTION: CATH118234

## 2020-10-28 LAB — CBC
HCT: 41.7 % (ref 36.0–46.0)
Hemoglobin: 14.5 g/dL (ref 12.0–15.0)
MCH: 31 pg (ref 26.0–34.0)
MCHC: 34.8 g/dL (ref 30.0–36.0)
MCV: 89.3 fL (ref 80.0–100.0)
Platelets: 354 10*3/uL (ref 150–400)
RBC: 4.67 MIL/uL (ref 3.87–5.11)
RDW: 12.9 % (ref 11.5–15.5)
WBC: 15 10*3/uL — ABNORMAL HIGH (ref 4.0–10.5)
nRBC: 0 % (ref 0.0–0.2)

## 2020-10-28 LAB — HEMOGLOBIN A1C
Hgb A1c MFr Bld: 5.2 % (ref 4.8–5.6)
Mean Plasma Glucose: 102.54 mg/dL

## 2020-10-28 LAB — CBC WITH DIFFERENTIAL/PLATELET
Abs Immature Granulocytes: 0.07 10*3/uL (ref 0.00–0.07)
Basophils Absolute: 0 10*3/uL (ref 0.0–0.1)
Basophils Relative: 0 %
Eosinophils Absolute: 0 10*3/uL (ref 0.0–0.5)
Eosinophils Relative: 0 %
HCT: 40.6 % (ref 36.0–46.0)
Hemoglobin: 14 g/dL (ref 12.0–15.0)
Immature Granulocytes: 1 %
Lymphocytes Relative: 14 %
Lymphs Abs: 1.8 10*3/uL (ref 0.7–4.0)
MCH: 30.9 pg (ref 26.0–34.0)
MCHC: 34.5 g/dL (ref 30.0–36.0)
MCV: 89.6 fL (ref 80.0–100.0)
Monocytes Absolute: 1.6 10*3/uL — ABNORMAL HIGH (ref 0.1–1.0)
Monocytes Relative: 12 %
Neutro Abs: 9.9 10*3/uL — ABNORMAL HIGH (ref 1.7–7.7)
Neutrophils Relative %: 73 %
Platelets: 351 10*3/uL (ref 150–400)
RBC: 4.53 MIL/uL (ref 3.87–5.11)
RDW: 13 % (ref 11.5–15.5)
WBC: 13.5 10*3/uL — ABNORMAL HIGH (ref 4.0–10.5)
nRBC: 0 % (ref 0.0–0.2)

## 2020-10-28 LAB — BASIC METABOLIC PANEL
Anion gap: 14 (ref 5–15)
BUN: 19 mg/dL (ref 8–23)
CO2: 22 mmol/L (ref 22–32)
Calcium: 9.4 mg/dL (ref 8.9–10.3)
Chloride: 97 mmol/L — ABNORMAL LOW (ref 98–111)
Creatinine, Ser: 0.61 mg/dL (ref 0.44–1.00)
GFR, Estimated: >60 mL/min (ref >60)
Glucose, Bld: 129 mg/dL — ABNORMAL HIGH (ref 70–99)
Potassium: 3.7 mmol/L (ref 3.5–5.1)
Sodium: 133 mmol/L — ABNORMAL LOW (ref 135–145)

## 2020-10-28 LAB — GLUCOSE, CAPILLARY
Glucose-Capillary: 141 mg/dL — ABNORMAL HIGH (ref 70–99)
Glucose-Capillary: 131 mg/dL — ABNORMAL HIGH (ref 70–99)
Glucose-Capillary: 114 mg/dL — ABNORMAL HIGH (ref 70–99)

## 2020-10-28 LAB — TROPONIN I (HIGH SENSITIVITY): Troponin I (High Sensitivity): 11195 ng/L (ref <18)

## 2020-10-28 LAB — SARS CORONAVIRUS 2 (TAT 6-24 HRS): SARS Coronavirus 2: NEGATIVE

## 2020-10-28 LAB — HEPARIN LEVEL (UNFRACTIONATED): Heparin Unfractionated: <0.1 IU/mL — ABNORMAL LOW (ref 0.30–0.70)

## 2020-10-28 LAB — CARDIAC CATHETERIZATION: Cath EF Quantitative: 55 %

## 2020-10-28 SURGERY — LEFT HEART CATH AND CORONARY ANGIOGRAPHY
Anesthesia: Moderate Sedation

## 2020-10-28 MED ORDER — SODIUM CHLORIDE 0.9 % IV SOLN
INTRAVENOUS | Status: AC | PRN
  Administered 2020-10-28: 1.75 mg/kg/h via INTRAVENOUS

## 2020-10-28 MED ORDER — ONDANSETRON HCL 4 MG/2ML IJ SOLN: 4.0000 mg | Freq: Four times a day (QID) | INTRAMUSCULAR | Status: DC | PRN

## 2020-10-28 MED ORDER — SODIUM CHLORIDE 0.9 % WEIGHT BASED INFUSION: 3.0000 mL/kg/h | INTRAVENOUS | Status: DC

## 2020-10-28 MED ORDER — BIVALIRUDIN TRIFLUOROACETATE 250 MG IV SOLR
INTRAVENOUS | Status: AC
  Filled 2020-10-28: qty 250

## 2020-10-28 MED ORDER — HEPARIN (PORCINE) IN NACL 1000-0.9 UT/500ML-% IV SOLN
INTRAVENOUS | Status: AC
  Filled 2020-10-28: qty 1000

## 2020-10-28 MED ORDER — LABETALOL HCL 5 MG/ML IV SOLN
INTRAVENOUS | Status: AC
  Filled 2020-10-28: qty 4

## 2020-10-28 MED ORDER — IOHEXOL 350 MG/ML SOLN
INTRAVENOUS | Status: DC | PRN
  Administered 2020-10-28: 380 mL

## 2020-10-28 MED ORDER — HEPARIN BOLUS VIA INFUSION
2000.0000 [IU] | Freq: Once | INTRAVENOUS | Status: AC
  Administered 2020-10-28: 2000 [IU] via INTRAVENOUS
  Filled 2020-10-28: qty 2000

## 2020-10-28 MED ORDER — SODIUM CHLORIDE 0.9 % IV SOLN: 250.0000 mL | INTRAVENOUS | Status: DC | PRN

## 2020-10-28 MED ORDER — SODIUM CHLORIDE 0.9% FLUSH
3.0000 mL | Freq: Two times a day (BID) | INTRAVENOUS | Status: DC
  Administered 2020-10-28: 3 mL via INTRAVENOUS

## 2020-10-28 MED ORDER — FENTANYL CITRATE PF 50 MCG/ML IJ SOSY
PREFILLED_SYRINGE | INTRAMUSCULAR | Status: AC
  Filled 2020-10-28: qty 1

## 2020-10-28 MED ORDER — TICAGRELOR 90 MG PO TABS
90.0000 mg | ORAL_TABLET | Freq: Two times a day (BID) | ORAL | Status: DC
  Administered 2020-10-29 – 2020-10-30 (×3): 90 mg via ORAL
  Filled 2020-10-28 (×3): qty 1

## 2020-10-28 MED ORDER — LABETALOL HCL 5 MG/ML IV SOLN: 10.0000 mg | INTRAVENOUS | Status: AC | PRN

## 2020-10-28 MED ORDER — TICAGRELOR 90 MG PO TABS
ORAL_TABLET | ORAL | Status: DC | PRN
  Administered 2020-10-28: 180 mg via ORAL

## 2020-10-28 MED ORDER — ASPIRIN 81 MG PO CHEW
81.0000 mg | CHEWABLE_TABLET | Freq: Every day | ORAL | Status: DC
  Administered 2020-10-29 – 2020-10-30 (×2): 81 mg via ORAL
  Filled 2020-10-28 (×2): qty 1

## 2020-10-28 MED ORDER — HYDRALAZINE HCL 20 MG/ML IJ SOLN: 10.0000 mg | INTRAMUSCULAR | Status: AC | PRN

## 2020-10-28 MED ORDER — ATORVASTATIN CALCIUM 80 MG PO TABS
80.0000 mg | ORAL_TABLET | Freq: Every day | ORAL | Status: DC
  Administered 2020-10-29 – 2020-10-30 (×2): 80 mg via ORAL
  Filled 2020-10-28 (×2): qty 1

## 2020-10-28 MED ORDER — HEPARIN (PORCINE) IN NACL 1000-0.9 UT/500ML-% IV SOLN
INTRAVENOUS | Status: DC | PRN
  Administered 2020-10-28 (×2): 500 mL

## 2020-10-28 MED ORDER — TICAGRELOR 90 MG PO TABS
ORAL_TABLET | ORAL | Status: AC
  Filled 2020-10-28: qty 2

## 2020-10-28 MED ORDER — ACETAMINOPHEN 325 MG PO TABS
650.0000 mg | ORAL_TABLET | ORAL | Status: DC | PRN
  Administered 2020-10-29 – 2020-10-30 (×2): 650 mg via ORAL
  Filled 2020-10-28 (×2): qty 2

## 2020-10-28 MED ORDER — FENTANYL CITRATE (PF) 100 MCG/2ML IJ SOLN
INTRAMUSCULAR | Status: DC | PRN
  Administered 2020-10-28: 25 ug via INTRAVENOUS

## 2020-10-28 MED ORDER — LIDOCAINE HCL 1 % IJ SOLN
INTRAMUSCULAR | Status: AC
  Filled 2020-10-28: qty 20

## 2020-10-28 MED ORDER — SODIUM CHLORIDE 0.9% FLUSH: 3.0000 mL | INTRAVENOUS | Status: DC | PRN

## 2020-10-28 MED ORDER — BIVALIRUDIN BOLUS VIA INFUSION - CUPID
INTRAVENOUS | Status: DC | PRN
  Administered 2020-10-28: 45.825 mg via INTRAVENOUS

## 2020-10-28 MED ORDER — SODIUM CHLORIDE 0.9 % WEIGHT BASED INFUSION
1.0000 mL/kg/h | INTRAVENOUS | Status: AC
  Administered 2020-10-28: 1 mL/kg/h via INTRAVENOUS

## 2020-10-28 MED ORDER — METOPROLOL TARTRATE 25 MG PO TABS
25.0000 mg | ORAL_TABLET | Freq: Two times a day (BID) | ORAL | Status: DC
  Administered 2020-10-28 – 2020-10-30 (×4): 25 mg via ORAL
  Filled 2020-10-28 (×4): qty 1
  Filled 2020-10-28: qty 0.5

## 2020-10-28 MED ORDER — MIDAZOLAM HCL 2 MG/2ML IJ SOLN
INTRAMUSCULAR | Status: DC | PRN
  Administered 2020-10-28: 1 mg via INTRAVENOUS

## 2020-10-28 MED ORDER — SODIUM CHLORIDE 0.9% FLUSH
3.0000 mL | Freq: Two times a day (BID) | INTRAVENOUS | Status: DC
  Administered 2020-10-29 – 2020-10-30 (×3): 3 mL via INTRAVENOUS

## 2020-10-28 MED ORDER — SODIUM CHLORIDE 0.9 % IV SOLN
INTRAVENOUS | Status: AC | PRN
  Administered 2020-10-28: 100 mL/h via INTRAVENOUS
  Administered 2020-10-28: 61 mL/h via INTRAVENOUS

## 2020-10-28 MED ORDER — LIDOCAINE HCL (PF) 1 % IJ SOLN
INTRAMUSCULAR | Status: DC | PRN
  Administered 2020-10-28: 20 mL

## 2020-10-28 MED ORDER — MIDAZOLAM HCL 2 MG/2ML IJ SOLN
INTRAMUSCULAR | Status: AC
  Filled 2020-10-28: qty 2

## 2020-10-28 MED ORDER — SODIUM CHLORIDE 0.9 % IV SOLN
0.2500 mg/kg/h | INTRAVENOUS | Status: AC
  Administered 2020-10-28: 0.25 mg/kg/h via INTRAVENOUS
  Filled 2020-10-28: qty 250

## 2020-10-28 MED ORDER — LABETALOL HCL 5 MG/ML IV SOLN
INTRAVENOUS | Status: DC | PRN
  Administered 2020-10-28: 10 mg via INTRAVENOUS

## 2020-10-28 MED ORDER — SODIUM CHLORIDE 0.9 % WEIGHT BASED INFUSION: 1.0000 mL/kg/h | INTRAVENOUS | Status: DC

## 2020-10-28 SURGICAL SUPPLY — 22 items
BALLN TREK RX 2.5X15 (BALLOONS) ×2
BALLN ~~LOC~~ TREK RX 2.5X15 (BALLOONS) ×2
BALLOON TREK RX 2.5X15 (BALLOONS) ×1 IMPLANT
BALLOON ~~LOC~~ TREK RX 2.5X15 (BALLOONS) ×1 IMPLANT
CATH INFINITI 5FR JL4 (CATHETERS) ×2 IMPLANT
CATH INFINITI JR4 5F (CATHETERS) ×2 IMPLANT
CATH VISTA GUIDE 6FRXBLAD3.5SH (CATHETERS) ×2 IMPLANT
DEVICE CLOSURE MYNXGRIP 6/7F (Vascular Products) ×2 IMPLANT
DEVICE SAFEGUARD 24CM (GAUZE/BANDAGES/DRESSINGS) ×2 IMPLANT
KIT ENCORE 26 ADVANTAGE (KITS) ×2 IMPLANT
NEEDLE PERC 18GX7CM (NEEDLE) ×2 IMPLANT
PACK CARDIAC CATH (CUSTOM PROCEDURE TRAY) ×2 IMPLANT
PROTECTION STATION PRESSURIZED (MISCELLANEOUS) ×2
SET ATX SIMPLICITY (MISCELLANEOUS) ×2 IMPLANT
SHEATH AVANTI 5FR X 11CM (SHEATH) ×2 IMPLANT
SHEATH AVANTI 6FR X 11CM (SHEATH) ×2 IMPLANT
STATION PROTECTION PRESSURIZED (MISCELLANEOUS) ×1 IMPLANT
STENT ONYX FRONTIER 2.5X15 (Permanent Stent) ×2 IMPLANT
STENT ONYX FRONTIER 2.5X22 (Permanent Stent) IMPLANT
STENT ONYX FRONTIER 2.5X30 (Permanent Stent) ×2 IMPLANT
WIRE G HI TQ BMW 190 (WIRE) ×2 IMPLANT
WIRE GUIDERIGHT .035X150 (WIRE) ×2 IMPLANT

## 2020-10-28 NOTE — Consult Note (Signed)
ANTICOAGULATION CONSULT NOTE - Initial Consult  Pharmacy Consult for IV heparin Indication: chest pain/ACS  No Known Allergies  Patient Measurements: Height: 5\' 1"  (154.9 cm) Weight: 61.1 kg (134 lb 9.6 oz) IBW/kg (Calculated) : 47.8 Heparin Dosing Weight: 60.2kg  Vital Signs: Temp: 98 F (36.7 C) (09/09 0720) BP: 107/95 (09/09 0720) Pulse Rate: 75 (09/09 0720)  Labs: Recent Labs    10/27/20 1256 10/27/20 1300 10/27/20 2100 10/27/20 2158 10/28/20 0014 10/28/20 0655  HGB 15.3*  --   --   --  14.5 14.0  HCT 44.5  --   --   --  41.7 40.6  PLT 386  --   --   --  354 351  APTT  --   --  142*  --   --   --   LABPROT  --   --  14.0  --   --   --   INR  --   --  1.1  --   --   --   HEPARINUNFRC  --   --   --   --   --  <0.10*  CREATININE 0.73  --   --   --  0.61  --   TROPONINIHS  --  12,616*  --  12,495* 11,195*  --      Estimated Creatinine Clearance: 52.5 mL/min (by C-G formula based on SCr of 0.61 mg/dL).   Medical History: History reviewed. No pertinent past medical history. Patient reports a cardiac history for cardiac bypass in the 1980's  Medications:  Medications Prior to Admission  Medication Sig Dispense Refill Last Dose   aspirin 81 MG EC tablet Take 81 mg by mouth daily.   10/27/2020   No documented PTA meds except ASA EC 81mg  daily  Assessment: 74YOF presenting with chest pain initially as well as confusion. Troponins significantly elevated. CT head without hemorrhage, CBC stable.   9/9 0655 HL < 0.1  Goal of Therapy:  Heparin level 0.3-0.7 units/ml Monitor platelets by anticoagulation protocol: Yes   Plan:  Heparin level was subtherapeutic. Will give 2000 units bolus and increase heparin infusion to 850 units/hr. Recheck heparin level in 8 hours. CBC daily while on heparin.   , PharmD, BCPS 10/28/2020,10:35 AM

## 2020-10-28 NOTE — CV Procedure (Signed)
Brief post cath PCI note  Asked to perform cardiac cath on patient with a STEMI/non-STEMI presentation yesterday now with positive troponins history of previous bypass surgery Patient referred by Dr. Gollan/[facility]  Cardiac cath Patient was brought to the cardiac Cath Lab 5 French sheath placed in the right femoral artery Patient had normal left ventricular function Coronaries nondominant right coronary artery Left main large Short free of disease Circumflex large left dominant relatively free of disease LAD was large with a proximal hazy 75 and a mid 99% hazy lesion TIMI-3 flow  Intervention PCI and stent to mid LAD with a 2.5 x 30 mm D ES Onyx frontier Lesion reduced from 99 down to 0% TIMI-3 flow before and after procedure  Proximal LAD Direct stent 2.5 x 15 mm DES Onyx frontier 13 atm Lesion reduced from 75 down to 0 TIMI-3 flow before and after procedure  Continue Angiomax for an additional 2 hours Cardiology care transferred back to see [facility] Dr. Rockey Situ

## 2020-10-28 NOTE — Plan of Care (Signed)
  Problem: Education: Goal: Knowledge of General Education information will improve Description: Including pain rating scale, medication(s)/side effects and non-pharmacologic comfort measures Outcome: Progressing Note: Patient profile completed. No complaints of pain. Patient is on a heparin drip. Skin intact.

## 2020-10-28 NOTE — Consult Note (Signed)
Cardiology Consultation:   Patient ID: Penny Mccullough; [id number]; 1946/02/11   Admit date: 10/27/2020 Date of Consult: 10/28/2020  Primary Care Provider: Pcp, No Primary Cardiologist: New to [facility] -consult by [place] Primary Electrophysiologist:  None   Patient Profile:   Penny Mccullough is a 75 y.o. female with a hx of CAD with remote CABG approximately 30 years ago (suspected to be a LIMA-LAD per history), CVA, HLD, and COPD with a long history of tobacco use who is being seen today for the evaluation of NSTEMI at the request of Dr. Cyndia Bent.  History of Present Illness:   Penny Mccullough has a history of remote CABG x 1, at which time she was told "they took a main artery from my chest," and appears to be a LIMA to LAD. No ischemic events since.   She had an episode of chest pain on 9/5 that lasted 3-4 hours followed by resolution and persistent fatigue and malaise since. With this fatigue, she also noted several episodes of nausea and vomiting. Symptoms initially began following a funeral. She is uncertain if symptoms are similar to her prior angina.   Upon arriving to [facility], vitals were stable. Initial EKG showed sinus rhythm with 1-2 mm inferior st elevation with lateral st/t changes. Initial [phone] [place] 0[address], [place]ded to 11[address]. D-dimer 0.98. CTA chest was negative for acute aortic process or PE with incidental findings noted as below. CT head was not acute, with subacute to chronic left basal ganglia lacunar infarct noted. She received ASA 324 mg x 1, IV Rocephin for possible UTI, and was placed on a heparin gtt. Rounding team at [facility] was notified of the consult at 1312 on 10/28/2020. First contact with patient at 1340. Currently, chest pain free.     Past Medical History:  Diagnosis Date   CAD (coronary artery disease)    COPD (chronic obstructive pulmonary disease) (HCC)    CVA (cerebral vascular accident) (HCC)    HLD (hyperlipidemia)     Home Meds: Prior  to Admission medications   Medication Sig Start Date End Date Taking? Authorizing Provider  aspirin 81 MG EC tablet Take 81 mg by mouth daily.   Yes [provider]    Inpatient Medications: Scheduled Meds:  aspirin  81 mg Oral Daily   atorvastatin  40 mg Oral Daily   insulin aspart  0-9 Units Subcutaneous TID WC   sodium chloride flush  3 mL Intravenous Q12H   Continuous Infusions:  sodium chloride Stopped (10/28/20 0740)   sodium chloride     [START ON 10/29/2020] sodium chloride     Followed by   Melene Muller ON 10/29/2020] sodium chloride     cefTRIAXone (ROCEPHIN)  IV Stopped (10/28/20 0740)   heparin 850 Units/hr (10/28/20 1045)   PRN Meds: sodium chloride, sodium chloride, sodium chloride flush  Allergies:  No Known Allergies  Social History:   Social History   Socioeconomic History   Marital status: Single    Spouse name: Not on file   Number of children: Not on file   Years of education: Not on file   Highest education level: Not on file  Occupational History   Not on file  Tobacco Use   Smoking status: Not on file   Smokeless tobacco: Not on file  Substance and Sexual Activity   Alcohol use: Not on file   Drug use: Not on file   Sexual activity: Not on file  Other Topics Concern  Not on file  Social History Narrative   Not on file   Social Determinants of Health   Financial Resource Strain: Not on file  Food Insecurity: Not on file  Transportation Needs: Not on file  Physical Activity: Not on file  Stress: Not on file  Social Connections: Not on file  Intimate Partner Violence: Not on file     Family History:   Family History  Problem Relation Age of Onset   Hypertension Mother     ROS:  Review of Systems  Constitutional:  Positive for malaise/fatigue. Negative for chills, diaphoresis, fever and weight loss.  HENT:  Negative for congestion.   Eyes:  Negative for discharge and redness.  Respiratory:  Positive for shortness of breath.  Negative for cough, sputum production and wheezing.   Cardiovascular:  Positive for chest pain. Negative for palpitations, orthopnea, claudication, leg swelling and PND.  Gastrointestinal:  Negative for abdominal pain, heartburn, nausea and vomiting.  Musculoskeletal:  Negative for falls and myalgias.  Skin:  Negative for rash.  Neurological:  Positive for weakness. Negative for dizziness, tingling, tremors, sensory change, speech change, focal weakness and loss of consciousness.  Endo/Heme/Allergies:  Does not bruise/bleed easily.  Psychiatric/Behavioral:  Negative for substance abuse. The patient is not nervous/anxious.   All other systems reviewed and are negative.    Physical Exam/Data:   Vitals:   10/28/20 0112 10/28/20 0419 10/28/20 0720 10/28/20 1158  BP: (!) 173/91 (!) 155/74 (!) 107/95 139/73  Pulse: 80 80 75 79  Resp: 20 16 17 17   Temp: 98.6 F (37 C) 97.6 F (36.4 C) 98 F (36.7 C) 97.9 F (36.6 C)  TempSrc:    Oral  SpO2: 96% 100% 96% 98%  Weight:      Height:        Intake/Output Summary (Last 24 hours) at 10/28/2020 1408 Last data filed at 10/28/2020 1250 Gross per 24 hour  Intake 205.35 ml  Output 400 ml  Net -194.65 ml   Filed Weights   10/27/20 1254 10/27/20 2123  Weight: 61.2 kg 61.1 kg   Body mass index is 25.43 kg/m.   Physical Exam: General: Well developed, well nourished, in no acute distress. Head: Normocephalic, atraumatic, sclera non-icteric, no xanthomas, nares without discharge.  Neck: Negative for carotid bruits. JVD not elevated. Lungs: Expiratory wheezing. Breathing is unlabored. Heart: RRR with S1 S2. No murmurs, rubs, or gallops appreciated. Abdomen: Soft, non-tender, non-distended with normoactive bowel sounds. No hepatomegaly. No rebound/guarding. No obvious abdominal masses. Msk:  Strength and tone appear normal for age. Extremities: No clubbing or cyanosis. No edema. Distal pedal pulses are 2+ and equal bilaterally. Neuro: Alert and  oriented X 3. No facial asymmetry. No focal deficit. Moves all extremities spontaneously. Psych:  Responds to questions appropriately with a normal affect.   EKG:  The EKG was personally reviewed and demonstrates: Initial EKG in the ED showed NSR, 94 bpm, 1-2 mm inferior st elevation with reciprocal lateral st/t changes. Repeat EKG this afternoon at time of cardiology consult shows NSR, 77 bpm, 1-2 mm inferior st elevation with reciprocal anterior st/t changes Telemetry:  Telemetry was personally reviewed and demonstrates: SR with an episode of ventricular bigeminy   Weights: Filed Weights   10/27/20 1254 10/27/20 2123  Weight: 61.2 kg 61.1 kg    Relevant CV Studies:  None available for review  Laboratory Data:  Chemistry Recent Labs  Lab 10/27/20 1256 10/28/20 0014  NA 132* 133*  K 3.7 3.7  CL  98 97*  CO2 21* 22  GLUCOSE 199* 129*  BUN 16 19  CREATININE 0.73 0.61  CALCIUM 9.2 9.4  GFRNONAA >60 >60  ANIONGAP 13 14    Recent Labs  Lab 10/27/20 1256  PROT 7.7  ALBUMIN 4.2  AST 75*  ALT 35  ALKPHOS 117  BILITOT 1.7*   Hematology Recent Labs  Lab 10/27/20 1256 10/28/20 0014 10/28/20 0655  WBC 16.1* 15.0* 13.5*  RBC 5.04 4.67 4.53  HGB 15.3* 14.5 14.0  HCT 44.5 41.7 40.6  MCV 88.3 89.3 89.6  MCH 30.4 31.0 30.9  MCHC 34.4 34.8 34.5  RDW 12.8 12.9 13.0  PLT 386 354 351   Cardiac EnzymesNo results for input(s): TROPONINI in the last 168 hours. No results for input(s): TROPIPOC in the last 168 hours.  BNPNo results for input(s): BNP, PROBNP in the last 168 hours.  DDimer  Recent Labs  Lab 10/27/20 1300  DDIMER 0.98*    Radiology/Studies:  DG Chest 2 View  Result Date: 10/27/2020 IMPRESSION: 1. Patchy lingular and right middle lobe opacity which may be due to atelectasis, scar, or minimal infiltrate. Electronically Signed   By: Jasmine Pang M.D.   On: 10/27/2020 18:21   CT HEAD WO CONTRAST ( )  Result Date: 10/27/2020 IMPRESSION: 1. No acute  intracranial abnormalities. 2. 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 Electronically Signed   By: Signa Kell M.D.   On: 10/27/2020 14:14   US Carotid Bilateral  Result Date: 10/28/2020 IMPRESSION: Left greater than right carotid bifurcation atheromatous plaque with Doppler measurements indicative of less than 50% stenosis. Electronically Signed   By: Acquanetta Belling M.D.   On: 10/28/2020 09:49   CT ANGIO CHEST AORTA W/CM & OR WO/CM  Result Date: 10/27/2020 IMPRESSION: 1. No aortic dissection or acute aortic findings. No pulmonary embolus. 2. Moderate emphysema with mild central bronchial wall thickening. 3. Bandlike opacities in the lingula and right middle lobe typical of chronic atelectasis. 4. Coronary artery calcifications. 5. Lobulated 2.6 cm left breast lesion with single peripheral calcification. Recommend up-to-date mammography. 6. Right upper lobe 3 mm pulmonary nodule. No follow-up needed if patient is low-risk. Non-contrast chest CT can be considered in 12 months if patient is high-risk. This recommendation follows the consensus statement: Guidelines for Management of Incidental Pulmonary Nodules Detected on CT Images: From the Fleischner Society 2017; Radiology 2017; 284:228-243. 7. Small hiatal hernia. 8. Short segment approximately 50% luminal narrowing of the left subclavian due to atheromatous plaque. Aortic Atherosclerosis (ICD10-I70.0) and Emphysema (ICD10-J43.9). Electronically Signed   By: Narda Rutherford M.D.   On: 10/27/2020 19:41    Assessment and Plan:   1. NSTEMI: -Currently, chest pain free -Initial and peak high sensitivity troponin 76808, now down trending  -Initial and repeat EKG concerning for inferior ischemia  -Cannot exclude initial event occurring on Monday, 9/5 based on history and troponin trend -LHC today -ASA -Heparin gtt -NPO -Echo -Aggressive risk factor modification and secondary prevention  -Titrate Lipitor to 80 mg with recommendation  for further medication escalation post cath -Risks and benefits of cardiac catheterization have been discussed with the patient including risks of bleeding, bruising, infection, kidney damage, stroke, heart attack, and death. The patient understands these risks and is willing to proceed with the procedure. All questions have been answered and concerns listened to  2. Abnormal chest CTA: -Will need follow up in the outpatient setting as per IM direction   3. COPD: -Wheezing on exam -Per IM  4. Leukocytosis: -No obvious infection -Likely inflammatory in the setting of #1  5. HLD: -LDL 199 this admission -Not on a statin prior to admission -Goal LDL < 70 -Titrate Lipitor to 80 mg  6. History of CVA: -No new deficits  -ASA and Lipitor as above   For questions or updates, please contact [facility] Please consult [email or web address] for contact info under Cardiology/STEMI.   Signed, Eula Listen, PA-C [facility] Pager: [phone] 10/28/2020, 2:08 PM

## 2020-10-28 NOTE — Progress Notes (Signed)
PROGRESS NOTE    Penny Mccullough  WUX:[id number] DOB: April 07, 1945 DOA: 10/27/2020 PCP: Pcp, No   Brief Narrative: Taken from H&P. Penny Mccullough is a 75 y.o. female with medical history significant for history of remote bypass x1 in the 90s who presents with concerns of weakness and fatigue.  Patient has not seen physician in years.  Apparently on 9/5 after coming back from a funeral she developed midsternal chest pain which lasted for couple of hours and improved with rest.  Chest pain was associated with nausea and vomiting.  Since then she was feeling weak and fatigued.  Worsening exertional dyspnea.  She came to ED at family's request yesterday.  EKG concerning for inferolateral ischemia, troponin significantly elevated at 12,616-concerning for NSTEMI.  Patient was started on heparin infusion and cardiology was consulted. Will be going for cardiac catheterization later today.  CT aortic chest showed no PE or dissection but had incidental finding of left breast lesion, which will need further investigation with mammogram as an outpatient. 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  Denies any chest pain.  Continues to feel weak.  Daughter at bedside.  Per daughter at baseline patient is very active and never wants to stay in bed.  She has not seen a physician in years.  Apparently was told during a follow-up visit after CABG that she does not need to see a doctor now??  Assessment & Plan:   Principal Problem:   NSTEMI (non-ST elevated myocardial infarction) (HCC) Active Problems:   Acute lower UTI   Lacunar infarction (HCC)   Hyperglycemia   Breast lesion   Pulmonary nodule  NSTEMI.  Patient has an history of remote CABG.  Significantly elevated troponin and EKG concerning for inferolateral ischemia.  Going for cardiac catheterization later today. Lipid panel with elevated total  cholesterol and LDL of 199 with goal of less than 70.  A1c of 5.2 -Continue with aspirin and maximize the dose of Lipitor -Continue with heparin infusion. -Continue with supportive care -Follow-up cardiology recommendations after the procedure  UTI.  UA concerning for UTI.  Having some dysuria.  She was started on Rocephin. -Urine culture-ordered as add-on -Continue with Rocephin-we will de-escalate once more results are available  Prior CVA.  CT head within old/subacute to chronic left basal ganglia lacunar infarct.  No neurologic deficit.  Ultrasound of carotid with left greater than right carotid bifurcation erythematous plaques with less than 50% stenosis. -Aspirin and Lipitor  Concern of hyperglycemia.  A1c of 5.2 which is normal.  Left breast loculated lesion - 2.6 cm lesion.  Recommends outpatient mammogram   Right upper lobe nodule - 3 mm nodule.  Patient has low risk with last tobacco use 30+ years ago.  No further imaging recommended.  Objective: Vitals:   10/28/20 0112 10/28/20 0419 10/28/20 0720 10/28/20 1158  BP: (!) 173/91 (!) 155/74 (!) 107/95 139/73  Pulse: 80 80 75 79  Resp: 20 16 17 17   Temp: 98.6 F (37 C) 97.6 F (36.4 C) 98 F (36.7 C) 97.9 F (36.6 C)  TempSrc:    Oral  SpO2: 96% 100% 96% 98%  Weight:      Height:        Intake/Output Summary (Last 24 hours) at 10/28/2020 1433 Last data filed at 10/28/2020 1250 Gross per 24 hour  Intake 205.35 ml  Output 400 ml  Net -194.65 ml  Filed Weights   10/27/20 1254 10/27/20 2123  Weight: 61.2 kg 61.1 kg    Examination:  General exam: Appears calm and comfortable  Respiratory system: Clear to auscultation. Respiratory effort normal. Cardiovascular system: S1 & S2 heard, RRR.  Gastrointestinal system: Soft, nontender, nondistended, bowel sounds positive. Central nervous system: Alert and oriented. No focal neurological deficits. Extremities: No edema, no cyanosis, pulses intact and symmetrical. Skin:  No rashes, lesions or ulcers Psychiatry: Judgement and insight appear normal.   DVT prophylaxis: Heparin Code Status: Full Family Communication: Discussed with daughter at bedside. Disposition Plan:  Status is: Inpatient  Remains inpatient appropriate because:Inpatient level of care appropriate due to severity of illness  Dispo: The patient is from: Home              Anticipated d/c is to: Home              Patient currently is not medically stable to d/c.   Difficult to place patient No              Level of care: Progressive Cardiac  All the records are reviewed and case discussed with Care Management/Social Worker. Management plans discussed with the patient, nursing and they are in agreement.  Consultants:  Cardiology  Procedures:  Antimicrobials:  Rocephin  Data Reviewed: I have personally reviewed following labs and imaging studies  CBC: Recent Labs  Lab 10/27/20 1256 10/28/20 0014 10/28/20 0655  WBC 16.1* 15.0* 13.5*  NEUTROABS  --   --  9.9*  HGB 15.3* 14.5 14.0  HCT 44.5 41.7 40.6  MCV 88.3 89.3 89.6  PLT 386 354 351   Basic Metabolic Panel: Recent Labs  Lab 10/27/20 1256 10/28/20 0014  NA 132* 133*  K 3.7 3.7  CL 98 97*  CO2 21* 22  GLUCOSE 199* 129*  BUN 16 19  CREATININE 0.73 0.61  CALCIUM 9.2 9.4   GFR: Estimated Creatinine Clearance: 52.5 mL/min (by C-G formula based on SCr of 0.61 mg/dL). Liver Function Tests: Recent Labs  Lab 10/27/20 1256  AST 75*  ALT 35  ALKPHOS 117  BILITOT 1.7*  PROT 7.7  ALBUMIN 4.2   No results for input(s): LIPASE, AMYLASE in the last 168 hours. No results for input(s): AMMONIA in the last 168 hours. Coagulation Profile: Recent Labs  Lab 10/27/20 2100  INR 1.1   Cardiac Enzymes: No results for input(s): CKTOTAL, CKMB, CKMBINDEX, TROPONINI in the last 168 hours. BNP (last 3 results) No results for input(s): PROBNP in the last 8760 hours. HbA1C: Recent Labs    10/27/20 2158  HGBA1C 5.2    CBG: Recent Labs  Lab 10/27/20 2140 10/28/20 0721 10/28/20 1155  GLUCAP 126* 141* 131*   Lipid Profile: Recent Labs    10/27/20 2158  CHOL 270*  HDL 47  LDLCALC 199*  TRIG 120  CHOLHDL 5.7   Thyroid Function Tests: No results for input(s): TSH, T4TOTAL, FREET4, T3FREE, THYROIDAB in the last 72 hours. Anemia Panel: No results for input(s): VITAMINB12, FOLATE, FERRITIN, TIBC, IRON, RETICCTPCT in the last 72 hours. Sepsis Labs: No results for input(s): PROCALCITON, LATICACIDVEN in the last 168 hours.  Recent Results (from the past 240 hour(s))  SARS CORONAVIRUS 2 (TAT 6-24 HRS) Nasopharyngeal Nasopharyngeal Swab     Status: None   Collection Time: 10/27/20  5:25 PM   Specimen: Nasopharyngeal Swab  Result Value Ref Range Status   SARS Coronavirus 2 NEGATIVE NEGATIVE Final    Comment: (NOTE) SARS-CoV-2 target nucleic acids  are NOT DETECTED.  The SARS-CoV-2 RNA is generally detectable in upper and lower respiratory specimens during the acute phase of infection. Negative results do not preclude SARS-CoV-2 infection, do not rule out co-infections with other pathogens, and should not be used as the sole basis for treatment or other patient management decisions. Negative results must be combined with clinical observations, patient history, and epidemiological information. The expected result is Negative.  Fact Sheet for Patients: HairSlick.no  Fact Sheet for Healthcare Providers: quierodirigir.com  This test is not yet approved or cleared by the [place] FDA and  has been authorized for detection and/or diagnosis of SARS-CoV-2 by FDA under an Emergency Use Authorization (EUA). This EUA will remain  in effect (meaning this test can be used) for the duration of the COVID-19 declaration under Se ction 564(b)(1) of the Act, 21 U.S.C. section 360bbb-3(b)(1), unless the authorization is terminated or revoked  sooner.  Performed at [facility], [address] [place], [place] [address]      Radiology Studies: DG Chest 2 View  Result Date: 10/27/2020 CLINICAL DATA:  Chest pain abnormal EKG EXAM: CHEST - 2 VIEW COMPARISON:  None. FINDINGS: Post sternotomy changes. Mild diffuse bronchitic changes. No pleural effusion. Patchy lingular and right middle lobe opacity. Cardiac size within normal limits. Aortic atherosclerosis. No pneumothorax IMPRESSION: 1. Patchy lingular and right middle lobe opacity which may be due to atelectasis, scar, or minimal infiltrate. Electronically Signed   By: Jasmine Pang M.D.   On: 10/27/2020 18:21   CT HEAD WO CONTRAST ( )  Result Date: 10/27/2020 CLINICAL DATA:  Mental status change. EXAM: CT HEAD WITHOUT CONTRAST TECHNIQUE: Contiguous axial images were obtained from the base of the skull through the vertex without intravenous contrast. COMPARISON:  None. FINDINGS: Brain: Within the left basal ganglia there is a focal area of low attenuation measuring 1 cm, image 14/3. This is consistent with a subacute to chronic lacunar infarct. No signs of acute intracranial hemorrhage, mass or abnormal extra-axial fluid collection. Vascular: No hyperdense vessel or unexpected calcification. Skull: Normal. Negative for fracture or focal lesion. Sinuses/Orbits: No acute finding. Other: None. IMPRESSION: 1. No acute intracranial abnormalities. 2. 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 Electronically Signed   By: Signa Kell M.D.   On: 10/27/2020 14:14   US Carotid Bilateral  Result Date: 10/28/2020 CLINICAL DATA:  Stroke EXAM: BILATERAL CAROTID DUPLEX ULTRASOUND TECHNIQUE: Wallace Cullens scale imaging, color Doppler and duplex ultrasound were performed of bilateral carotid and vertebral arteries in the neck. COMPARISON:  None. FINDINGS: Criteria: Quantification of carotid stenosis is based on velocity parameters that correlate the residual internal carotid diameter with  NASCET-based stenosis levels, using the diameter of the distal internal carotid lumen as the denominator for stenosis measurement. The following velocity measurements were obtained: RIGHT ICA: 53/17 cm/sec CCA: 55/12 cm/sec SYSTOLIC ICA/CCA RATIO:  1.0 ECA: 77 cm/sec LEFT ICA: 86/28 cm/sec CCA: 58/16 cm/sec SYSTOLIC ICA/CCA RATIO:  1.5 ECA: 59 cm/sec RIGHT CAROTID ARTERY: Mild heterogeneous atheromatous plaque noted at the right ICA origin. RIGHT VERTEBRAL ARTERY:  Antegrade flow. LEFT CAROTID ARTERY: Moderate predominantly noncalcified plaque noted at the carotid bifurcation. LEFT VERTEBRAL ARTERY:  Antegrade flow. IMPRESSION: Left greater than right carotid bifurcation atheromatous plaque with Doppler measurements indicative of less than 50% stenosis. Electronically Signed   By: Acquanetta Belling M.D.   On: 10/28/2020 09:49   CT ANGIO CHEST AORTA W/CM & OR WO/CM  Result Date: 10/27/2020 CLINICAL DATA:  Chest pain or  back pain, aortic dissection suspected eval for dissection or PE Altered mental status and chest pain. EXAM: CT ANGIOGRAPHY CHEST WITH CONTRAST TECHNIQUE: Noncontrast CT of the chest was obtained initially. Multidetector CT imaging of the chest was performed using the standard protocol during bolus administration of intravenous contrast. Multiplanar CT image reconstructions and MIPs were obtained to evaluate the vascular anatomy. CONTRAST:  OMNIPAQUE IOHEXOL 350 MG/ML SOLN COMPARISON:  Radiograph earlier today. FINDINGS: Cardiovascular: No evidence of high-density aortic hematoma on noncontrast chest CT. There is moderate atherosclerosis. No aneurysm. After the administration of IV contrast there is no evidence of dissection or acute aortic findings. Atherosclerotic plaque which is both calcified and noncalcified, including irregular plaque in the distal descending aorta. Conventional branch pattern from the aortic arch. Atherosclerotic plaque causes short segment approximately 50% luminal  narrowing of the left subclavian artery, series 5, image 15. excellent evaluation for pulmonary embolus. There are no filling defects within the pulmonary arteries to suggest pulmonary embolus. Heart is normal in size. There are coronary artery calcifications. No pericardial effusion. Mediastinum/Nodes: No enlarged mediastinal or hilar lymph nodes. Small hiatal hernia. No esophageal wall thickening. No visualized thyroid nodule. Left thyroid lobe is slightly atrophic. Lungs/Pleura: Mild to moderate emphysema. Mild central bronchial wall thickening. Bandlike opacities in the anterior right middle lobe and lingula are typical of chronic atelectasis. No confluent airspace disease or pneumonia. There is a 3 mm right upper lobe pulmonary nodule, series 6, image 32. No dominant pulmonary mass. No pleural fluid. No findings of pulmonary edema. Upper Abdomen: No acute or unexpected finding.  Small hiatal hernia. Musculoskeletal: Thoracic spondylosis with disc space narrowing and endplate spurring. Post median sternotomy. There is a 2.6 cm lobulated left breast lesion with single peripheral calcification in the inferior breast. Review of the MIP images confirms the above findings. IMPRESSION: 1. No aortic dissection or acute aortic findings. No pulmonary embolus. 2. Moderate emphysema with mild central bronchial wall thickening. 3. Bandlike opacities in the lingula and right middle lobe typical of chronic atelectasis. 4. Coronary artery calcifications. 5. Lobulated 2.6 cm left breast lesion with single peripheral calcification. Recommend up-to-date mammography. 6. Right upper lobe 3 mm pulmonary nodule. No follow-up needed if patient is low-risk. Non-contrast chest CT can be considered in 12 months if patient is high-risk. This recommendation follows the consensus statement: Guidelines for Management of Incidental Pulmonary Nodules Detected on CT Images: From the Fleischner Society 2017; Radiology 2017; 284:228-243. 7. Small  hiatal hernia. 8. Short segment approximately 50% luminal narrowing of the left subclavian due to atheromatous plaque. Aortic Atherosclerosis (ICD10-I70.0) and Emphysema (ICD10-J43.9). Electronically Signed   By: Narda Rutherford M.D.   On: 10/27/2020 19:41    Scheduled Meds:  aspirin  81 mg Oral Daily   [START ON 10/29/2020] atorvastatin  80 mg Oral Daily   insulin aspart  0-9 Units Subcutaneous TID WC   sodium chloride flush  3 mL Intravenous Q12H   Continuous Infusions:  sodium chloride Stopped (10/28/20 0740)   sodium chloride     [START ON 10/29/2020] sodium chloride     Followed by   Melene Muller ON 10/29/2020] sodium chloride     cefTRIAXone (ROCEPHIN)  IV Stopped (10/28/20 0740)   heparin 850 Units/hr (10/28/20 1045)     LOS: 1 day   Time spent: 45 minutes. More than 50% of the time was spent in counseling/coordination of care  Arnetha Courser, MD [facility]  If 7PM-7AM, please contact night-coverage [email or web address]  10/28/2020, 2:33 PM  This record has been created using Systems analyst. Errors have been sought and corrected,but may not always be located. Such creation errors do not reflect on the standard of care.

## 2020-10-29 ENCOUNTER — Inpatient Hospital Stay (HOSPITAL_COMMUNITY)
Admit: 2020-10-29 | Discharge: 2020-10-29 | Disposition: A | Discharge status: Home / Self Care | Payer: Medicare HMO | Attending: Physician Assistant | Admitting: Physician Assistant

## 2020-10-29 DIAGNOSIS — R079 Chest pain, unspecified: Secondary | ICD-10-CM

## 2020-10-29 DIAGNOSIS — E7800 Pure hypercholesterolemia, unspecified: Secondary | ICD-10-CM

## 2020-10-29 DIAGNOSIS — I517 Cardiomegaly: Secondary | ICD-10-CM

## 2020-10-29 DIAGNOSIS — Z8673 Personal history of transient ischemic attack (TIA), and cerebral infarction without residual deficits: Secondary | ICD-10-CM | POA: Diagnosis not present

## 2020-10-29 DIAGNOSIS — I5189 Other ill-defined heart diseases: Secondary | ICD-10-CM

## 2020-10-29 DIAGNOSIS — I081 Rheumatic disorders of both mitral and tricuspid valves: Secondary | ICD-10-CM

## 2020-10-29 DIAGNOSIS — I10 Essential (primary) hypertension: Secondary | ICD-10-CM | POA: Diagnosis not present

## 2020-10-29 DIAGNOSIS — I214 Non-ST elevation (NSTEMI) myocardial infarction: Secondary | ICD-10-CM | POA: Diagnosis not present

## 2020-10-29 LAB — ECHOCARDIOGRAM COMPLETE
AR max vel: 1.69 cm2
AV Peak grad: 11.6 mmHg
Ao pk vel: 1.7 m/s
Area-P 1/2: 1.71 cm2
Calc EF: 61.4 %
Height: 61 in
MV M vel: 5.96 m/s
MV Peak grad: 142.1 mmHg
MV VTI: 0.89 cm2
Radius: 0.9 cm
S' Lateral: 4.8 cm
Single Plane A2C EF: 55.1 %
Single Plane A4C EF: 64.1 %
Weight: 2153.59 oz

## 2020-10-29 LAB — CBC
HCT: 38.8 % (ref 36.0–46.0)
Hemoglobin: 13.1 g/dL (ref 12.0–15.0)
MCH: 30.2 pg (ref 26.0–34.0)
MCHC: 33.8 g/dL (ref 30.0–36.0)
MCV: 89.4 fL (ref 80.0–100.0)
Platelets: 312 10*3/uL (ref 150–400)
RBC: 4.34 MIL/uL (ref 3.87–5.11)
RDW: 12.9 % (ref 11.5–15.5)
WBC: 13 10*3/uL — ABNORMAL HIGH (ref 4.0–10.5)
nRBC: 0 % (ref 0.0–0.2)

## 2020-10-29 LAB — BASIC METABOLIC PANEL
Anion gap: 7 (ref 5–15)
BUN: 16 mg/dL (ref 8–23)
CO2: 23 mmol/L (ref 22–32)
Calcium: 8.4 mg/dL — ABNORMAL LOW (ref 8.9–10.3)
Chloride: 106 mmol/L (ref 98–111)
Creatinine, Ser: 0.6 mg/dL (ref 0.44–1.00)
GFR, Estimated: >60 mL/min (ref >60)
Glucose, Bld: 123 mg/dL — ABNORMAL HIGH (ref 70–99)
Potassium: 3.2 mmol/L — ABNORMAL LOW (ref 3.5–5.1)
Sodium: 136 mmol/L (ref 135–145)

## 2020-10-29 LAB — URINE CULTURE

## 2020-10-29 LAB — GLUCOSE, CAPILLARY
Glucose-Capillary: 120 mg/dL — ABNORMAL HIGH (ref 70–99)
Glucose-Capillary: 111 mg/dL — ABNORMAL HIGH (ref 70–99)
Glucose-Capillary: 117 mg/dL — ABNORMAL HIGH (ref 70–99)
Glucose-Capillary: 134 mg/dL — ABNORMAL HIGH (ref 70–99)

## 2020-10-29 LAB — MAGNESIUM: Magnesium: 2.2 mg/dL (ref 1.7–2.4)

## 2020-10-29 MED ORDER — LOSARTAN POTASSIUM 25 MG PO TABS
12.5000 mg | ORAL_TABLET | Freq: Every day | ORAL | Status: DC
  Administered 2020-10-29 – 2020-10-30 (×2): 12.5 mg via ORAL
  Filled 2020-10-29 (×2): qty 1

## 2020-10-29 MED ORDER — POTASSIUM CHLORIDE CRYS ER 20 MEQ PO TBCR
40.0000 meq | EXTENDED_RELEASE_TABLET | Freq: Two times a day (BID) | ORAL | Status: AC
  Administered 2020-10-29 (×2): 40 meq via ORAL
  Filled 2020-10-29: qty 4
  Filled 2020-10-29: qty 2

## 2020-10-29 NOTE — Progress Notes (Signed)
Progress Note  Patient Name: Penny Mccullough Date of Encounter: 10/29/2020  Primary Cardiologist: New to [facility] - consult by Mariah Milling  Subjective   She underwent LHC 9/9 with PCI to the ostial and mid LAD as outlined below. No issues from her cath site. Post cath renal function and HGB stable. BP 140s to 150s mmHg systolic this morning. No chest pain, dyspnea, palpitations, dizziness, presyncope, or syncope. Echo pending.   Inpatient Medications    Scheduled Meds:  aspirin  81 mg Oral Daily   aspirin  81 mg Oral Daily   atorvastatin  80 mg Oral Daily   insulin aspart  0-9 Units Subcutaneous TID WC   metoprolol tartrate  25 mg Oral BID   sodium chloride flush  3 mL Intravenous Q12H   ticagrelor  90 mg Oral BID   Continuous Infusions:  sodium chloride Stopped (10/28/20 0740)   sodium chloride     cefTRIAXone (ROCEPHIN)  IV Stopped (10/28/20 2249)   PRN Meds: sodium chloride, sodium chloride, acetaminophen, ondansetron (ZOFRAN) IV, sodium chloride flush   Vital Signs    Vitals:   10/28/20 2102 10/29/20 0151 10/29/20 0440 10/29/20 0716  BP: 139/79 (!) 155/90 (!) 156/74 (!) 144/78  Pulse: 77 66 67 71  Resp: 18 17 20 18   Temp: 98.4 F (36.9 C) 98.4 F (36.9 C) 98.2 F (36.8 C) 98.3 F (36.8 C)  TempSrc: Oral Oral Oral Oral  SpO2: 98% 98% 98% 98%  Weight:      Height:        Intake/Output Summary (Last 24 hours) at 10/29/2020 0830 Last data filed at 10/29/2020 0600 Gross per 24 hour  Intake 798.08 ml  Output 700 ml  Net 98.08 ml   Filed Weights   10/27/20 1254 10/27/20 2123 10/28/20 1408  Weight: 61.2 kg 61.1 kg 61.1 kg    Telemetry    NSR with rare PVCs - Personally Reviewed  ECG    NSR, 65 bpm, LVH, nonspecific inferior st/t changes, anterior TWI - Personally Reviewed  Physical Exam   GEN: No acute distress.   Neck: No JVD. Cardiac: RRR, no murmurs, rubs, or gallops. Right femoral cardiac cath site without bleeding, swelling, warmth, erythema, or TTP. No  bruit.   Respiratory: Clear to auscultation bilaterally.  GI: Soft, nontender, non-distended.   MS: No edema; No deformity. Neuro:  Alert and oriented x 3; Nonfocal.  Psych: Normal affect.  Labs    Chemistry Recent Labs  Lab 10/27/20 1256 10/28/20 0014 10/29/20 0621  NA 132* 133* 136  K 3.7 3.7 3.2*  CL 98 97* 106  CO2 21* 22 23  GLUCOSE 199* 129* 123*  BUN 16 19 16   CREATININE 0.73 0.61 0.60  CALCIUM 9.2 9.4 8.4*  PROT 7.7  --   --   ALBUMIN 4.2  --   --   AST 75*  --   --   ALT 35  --   --   ALKPHOS 117  --   --   BILITOT 1.7*  --   --   GFRNONAA >60 >60 >60  ANIONGAP 13 14 7      Hematology Recent Labs  Lab 10/28/20 0014 10/28/20 0655 10/29/20 0621  WBC 15.0* 13.5* 13.0*  RBC 4.67 4.53 4.34  HGB 14.5 14.0 13.1  HCT 41.7 40.6 38.8  MCV 89.3 89.6 89.4  MCH 31.0 30.9 30.2  MCHC 34.8 34.5 33.8  RDW 12.9 13.0 12.9  PLT 354 351 312    Cardiac  EnzymesNo results for input(s): TROPONINI in the last 168 hours. No results for input(s): TROPIPOC in the last 168 hours.   BNPNo results for input(s): BNP, PROBNP in the last 168 hours.   DDimer  Recent Labs  Lab 10/27/20 1300  DDIMER 0.98*     Radiology    DG Chest 2 View  Result Date: 10/27/2020 IMPRESSION: 1. Patchy lingular and right middle lobe opacity which may be due to atelectasis, scar, or minimal infiltrate. Electronically Signed   By: Jasmine Pang M.D.   On: 10/27/2020 18:21   CT HEAD WO CONTRAST ( )  Result Date: 10/27/2020 IMPRESSION: 1. No acute intracranial abnormalities. 2. 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 Electronically Signed   By: Signa Kell M.D.   On: 10/27/2020 14:14   US Carotid Bilateral  Result Date: 10/28/2020 IMPRESSION: Left greater than right carotid bifurcation atheromatous plaque with Doppler measurements indicative of less than 50% stenosis. Electronically Signed   By: Acquanetta Belling M.D.   On: 10/28/2020 09:49   CT ANGIO CHEST AORTA W/CM & OR  WO/CM  Result Date: 10/27/2020 IMPRESSION: 1. No aortic dissection or acute aortic findings. No pulmonary embolus. 2. Moderate emphysema with mild central bronchial wall thickening. 3. Bandlike opacities in the lingula and right middle lobe typical of chronic atelectasis. 4. Coronary artery calcifications. 5. Lobulated 2.6 cm left breast lesion with single peripheral calcification. Recommend up-to-date mammography. 6. Right upper lobe 3 mm pulmonary nodule. No follow-up needed if patient is low-risk. Non-contrast chest CT can be considered in 12 months if patient is high-risk. This recommendation follows the consensus statement: Guidelines for Management of Incidental Pulmonary Nodules Detected on CT Images: From the Fleischner Society 2017; Radiology 2017; 284:228-243. 7. Small hiatal hernia. 8. Short segment approximately 50% luminal narrowing of the left subclavian due to atheromatous plaque. Aortic Atherosclerosis (ICD10-I70.0) and Emphysema (ICD10-J43.9). Electronically Signed   By: Narda Rutherford M.D.   On: 10/27/2020 19:41    Cardiac Studies   LHC 10/28/2020:   Ost LAD lesion is 75% stenosed.   Mid LAD lesion is 99% stenosed.   A drug-eluting stent was successfully placed using a STENT ONYX FRONTIER 2.5X15.   A drug-eluting stent was successfully placed using a STENT ONYX FRONTIER 2.5X30.   A drug-eluting stent was successfully placed using a STENT ONYX FRONTIER 2.5X30.   Post intervention, there is a 0% residual stenosis.   Post intervention, there is a 0% residual stenosis.   The left ventricular systolic function is normal.   LV end diastolic pressure is normal.   Conclusion Cardiac cath Left main large free of disease Left ventricular function normal a EF of around 55% mild apical hypo- LAD large 75% ostial lesion 99% mid lesion ulcerated TIMI-3 flow Circumflex large left dominant free of disease RCA nondominant   Intervention PCI and stent to mid LAD with DES 2.5 x 30 mm DES PCI  and stent to ostial LAD 2.5 x 15 mm DES Continue aspirin Brilinta for at least 12 months Angiomax for 2 hours then discontinue Cardiac care transferred back to [facility]. Dr Mariah Milling  __________  2D echo pending   Patient Profile     75 y.o. female with history of CAD with remote CABG approximately 30 years ago (suspected to be a LIMA-LAD per history), CVA, HLD, and COPD with a long history of tobacco use who is being seen today for the evaluation of NSTEMI at the request of Dr. Cyndia Bent.  Assessment & Plan  1. NSTEMI: -No chest pain -[place] [address] -Status post PCI to the ostial and mid LAD -DAPT without interruption with ASA and Brilinta for at least the next 12 months (will need a Brilinta card prior to discharge, possibly 9/11) -Echo pending -Aggressive risk factor modification and secondary prevention  -Post cath instructions -Cardiac rehab -Will need to remain admitted given significant MI for continued observation overnight  2. Abnormal chest CTA: -Will need follow up in the outpatient setting as per IM direction    3. COPD: -Wheezing on exam -Per IM   4. Leukocytosis: -No obvious infection -Likely inflammatory in the setting of #1   5. HLD: -LDL 199 this admission -Not on a statin prior to admission -Goal LDL < 70 -Continue Lipitor 80 mg  6. HTN: -BP elevated in the 140s to 150s mmHg systolic this morning -Add losartan 12.5 mg  -Continue Lopressor 25 mg bid   6. History of CVA: -No new deficits  -ASA and Lipitor as above  7. Hypokalemia: -Replete to goal 4.0   For questions or updates, please contact [facility] Please consult [email or web address] for contact info under Cardiology/STEMI.    Signed, Eula Listen, PA-C [facility] Pager: [phone] 10/29/2020, 8:30 AM

## 2020-10-29 NOTE — Progress Notes (Signed)
*  PRELIMINARY RESULTS* Echocardiogram 2D Echocardiogram has been performed.  Penny Mccullough 10/29/2020, 1:02 PM

## 2020-10-29 NOTE — Progress Notes (Addendum)
PROGRESS NOTE    Penny Mccullough  UYE:[id number] DOB: March 15, 1945 DOA: 10/27/2020 PCP: Pcp, No   Brief Narrative: Taken from H&P. Penny Mccullough is a 75 y.o. female with medical history significant for history of remote bypass x1 in the 90s who presents with concerns of weakness and fatigue.  Patient has not seen physician in years.  Apparently on 9/5 after coming back from a funeral she developed midsternal chest pain which lasted for couple of hours and improved with rest.  Chest pain was associated with nausea and vomiting.  Since then she was feeling weak and fatigued.  Worsening exertional dyspnea.  She came to ED at family's request yesterday.  EKG concerning for inferolateral ischemia, troponin significantly elevated at 12,616-concerning for NSTEMI.  Patient was started on heparin infusion and cardiology was consulted. Patient underwent cardiac catheterization which shows a 90% stenosis of mid LAD and 75% of proximal LAD, s/p PCI with 2 drug-eluting stents placement.  Patient will continue with aspirin and Brilinta for 1 year.  Echocardiogram with EF of 40 to 45% and regional wall motion abnormalities including inferior and inferolateral area.  CT aortic chest showed no PE or dissection but had incidental finding of left breast lesion, which will need further investigation with mammogram as an outpatient. 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  Subjective: Patient was seen and examined today.  Denies any chest pain or shortness of breath.  Assessment & Plan:   Principal Problem:   NSTEMI (non-ST elevated myocardial infarction) (HCC) Active Problems:   Acute lower UTI   Lacunar infarction (HCC)   Hyperglycemia   Breast lesion   Pulmonary nodule  NSTEMI.  Patient has an history of remote CABG.  Significantly elevated troponin and EKG concerning for inferolateral ischemia.  Going for  cardiac catheterization later today. Lipid panel with elevated total cholesterol and LDL of 199 with goal of less than 70.  A1c of 5.2 Cardiac catheterization with 2 stents placement at proximal and mid LAD.  Echocardiogram with depressed EF at 40 to 45% with regional wall motion abnormalities.  Remained chest pain-free. -Continue with aspirin and maximize the dose of Lipitor -Continue with aspirin and Brilinta -Continue with supportive care -Will need cardiac rehab on discharge.  UTI.  UA concerning for UTI.  Having some dysuria.  She was started on Rocephin. -Urine culture-pending -Continue with Rocephin-we will de-escalate once more results are available  Hypokalemia.  Potassium at 3.2 with magnesium of 2.2 -Replete potassium and monitor  Prior CVA.  CT head within old/subacute to chronic left basal ganglia lacunar infarct.  No neurologic deficit.  Ultrasound of carotid with left greater than right carotid bifurcation erythematous plaques with less than 50% stenosis. -Aspirin and Lipitor  Concern of hyperglycemia.  A1c of 5.2 which is normal.  Left breast loculated lesion - 2.6 cm lesion.  Recommends outpatient mammogram   Right upper lobe nodule - 3 mm nodule.  Patient has low risk with last tobacco use 30+ years ago.  No further imaging recommended.  Objective: Vitals:   10/29/20 0151 10/29/20 0440 10/29/20 0716 10/29/20 1113  BP: (!) 155/90 (!) 156/74 (!) 144/78 (!) 141/68  Pulse: 66 67 71 60  Resp: 17 20 18 18   Temp: 98.4 F (36.9 C) 98.2 F (36.8 C) 98.3 F (36.8 C) 97.9 F (36.6 C)  TempSrc: Oral Oral Oral Oral  SpO2: 98% 98% 98% 100%  Weight:  Height:        Intake/Output Summary (Last 24 hours) at 10/29/2020 1408 Last data filed at 10/29/2020 1042 Gross per 24 hour  Intake 1038.08 ml  Output 300 ml  Net 738.08 ml    Filed Weights   10/27/20 1254 10/27/20 2123 10/28/20 1408  Weight: 61.2 kg 61.1 kg 61.1 kg    Examination:  General.  Pleasant  lady, in no acute distress. Pulmonary.  Lungs clear bilaterally, normal respiratory effort. CV.  Regular rate and rhythm, no JVD, rub or murmur. Abdomen.  Soft, nontender, nondistended, BS positive. CNS.  Alert and oriented x3.  No focal neurologic deficit. Extremities.  No edema, no cyanosis, pulses intact and symmetrical. Psychiatry.  Judgment and insight appears normal.   DVT prophylaxis: Heparin Code Status: Full Family Communication: Discussed with patient. Disposition Plan:  Status is: Inpatient  Remains inpatient appropriate because:Inpatient level of care appropriate due to severity of illness  Dispo: The patient is from: Home              Anticipated d/c is to: Home              Patient currently is not medically stable to d/c.   Difficult to place patient No              Level of care: Progressive Cardiac  All the records are reviewed and case discussed with Care Management/Social Worker. Management plans discussed with the patient, nursing and they are in agreement.  Consultants:  Cardiology  Procedures:  Antimicrobials:  Rocephin  Data Reviewed: I have personally reviewed following labs and imaging studies  CBC: Recent Labs  Lab 10/27/20 1256 10/28/20 0014 10/28/20 0655 10/29/20 0621  WBC 16.1* 15.0* 13.5* 13.0*  NEUTROABS  --   --  9.9*  --   HGB 15.3* 14.5 14.0 13.1  HCT 44.5 41.7 40.6 38.8  MCV 88.3 89.3 89.6 89.4  PLT 386 354 351 312    Basic Metabolic Panel: Recent Labs  Lab 10/27/20 1256 10/28/20 0014 10/29/20 0621  NA 132* 133* 136  K 3.7 3.7 3.2*  CL 98 97* 106  CO2 21* 22 23  GLUCOSE 199* 129* 123*  BUN 16 19 16   CREATININE 0.73 0.61 0.60  CALCIUM 9.2 9.4 8.4*  MG  --   --  2.2    GFR: Estimated Creatinine Clearance: 52.5 mL/min (by C-G formula based on SCr of 0.6 mg/dL). Liver Function Tests: Recent Labs  Lab 10/27/20 1256  AST 75*  ALT 35  ALKPHOS 117  BILITOT 1.7*  PROT 7.7  ALBUMIN 4.2    No results for input(s):  LIPASE, AMYLASE in the last 168 hours. No results for input(s): AMMONIA in the last 168 hours. Coagulation Profile: Recent Labs  Lab 10/27/20 2100  INR 1.1    Cardiac Enzymes: No results for input(s): CKTOTAL, CKMB, CKMBINDEX, TROPONINI in the last 168 hours. BNP (last 3 results) No results for input(s): PROBNP in the last 8760 hours. HbA1C: Recent Labs    10/27/20 2158  HGBA1C 5.2    CBG: Recent Labs  Lab 10/28/20 0721 10/28/20 1155 10/28/20 2137 10/29/20 0745 10/29/20 1128  GLUCAP 141* 131* 114* 120* 111*    Lipid Profile: Recent Labs    10/27/20 2158  CHOL 270*  HDL 47  LDLCALC 199*  TRIG 120  CHOLHDL 5.7    Thyroid Function Tests: No results for input(s): TSH, T4TOTAL, FREET4, T3FREE, THYROIDAB in the last 72 hours. Anemia Panel: No results for  input(s): VITAMINB12, FOLATE, FERRITIN, TIBC, IRON, RETICCTPCT in the last 72 hours. Sepsis Labs: No results for input(s): PROCALCITON, LATICACIDVEN in the last 168 hours.  Recent Results (from the past 240 hour(s))  SARS CORONAVIRUS 2 (TAT 6-24 HRS) Nasopharyngeal Nasopharyngeal Swab     Status: None   Collection Time: 10/27/20  5:25 PM   Specimen: Nasopharyngeal Swab  Result Value Ref Range Status   SARS Coronavirus 2 NEGATIVE NEGATIVE Final    Comment: (NOTE) SARS-CoV-2 target nucleic acids are NOT DETECTED.  The SARS-CoV-2 RNA is generally detectable in upper and lower respiratory specimens during the acute phase of infection. Negative results do not preclude SARS-CoV-2 infection, do not rule out co-infections with other pathogens, and should not be used as the sole basis for treatment or other patient management decisions. Negative results must be combined with clinical observations, patient history, and epidemiological information. The expected result is Negative.  Fact Sheet for Patients: HairSlick.no  Fact Sheet for Healthcare  Providers: quierodirigir.com  This test is not yet approved or cleared by the [place] FDA and  has been authorized for detection and/or diagnosis of SARS-CoV-2 by FDA under an Emergency Use Authorization (EUA). This EUA will remain  in effect (meaning this test can be used) for the duration of the COVID-19 declaration under Se ction 564(b)(1) of the Act, 21 U.S.C. section 360bbb-3(b)(1), unless the authorization is terminated or revoked sooner.  Performed at [facility], [address], [place], [place] [address]       Radiology Studies: DG Chest 2 View  Result Date: 10/27/2020 CLINICAL DATA:  Chest pain abnormal EKG EXAM: CHEST - 2 VIEW COMPARISON:  None. FINDINGS: Post sternotomy changes. Mild diffuse bronchitic changes. No pleural effusion. Patchy lingular and right middle lobe opacity. Cardiac size within normal limits. Aortic atherosclerosis. No pneumothorax IMPRESSION: 1. Patchy lingular and right middle lobe opacity which may be due to atelectasis, scar, or minimal infiltrate. Electronically Signed   By: Jasmine Pang M.D.   On: 10/27/2020 18:21   CARDIAC CATHETERIZATION  Result Date: 10/28/2020   Ost LAD lesion is 75% stenosed.   Mid LAD lesion is 99% stenosed.   A drug-eluting stent was successfully placed using a STENT ONYX FRONTIER 2.5X15.   A drug-eluting stent was successfully placed using a STENT ONYX FRONTIER 2.5X30.   A drug-eluting stent was successfully placed using a STENT ONYX FRONTIER 2.5X30.   Post intervention, there is a 0% residual stenosis.   Post intervention, there is a 0% residual stenosis.   The left ventricular systolic function is normal.   LV end diastolic pressure is normal. Conclusion Cardiac cath Left main large free of disease Left ventricular function normal a EF of around 55% mild apical hypo- LAD large 75% ostial lesion 99% mid lesion ulcerated TIMI-3 flow Circumflex large left dominant free of disease RCA nondominant  Intervention PCI and stent to mid LAD with DES 2.5 x 30 mm DES PCI and stent to ostial LAD 2.5 x 15 mm DES Continue aspirin Brilinta for at least 12 months Angiomax for 2 hours then discontinue Cardiac care transferred back to [facility]. Dr Mariah Milling   US Carotid Bilateral  Result Date: 10/28/2020 CLINICAL DATA:  Stroke EXAM: BILATERAL CAROTID DUPLEX ULTRASOUND TECHNIQUE: Wallace Cullens scale imaging, color Doppler and duplex ultrasound were performed of bilateral carotid and vertebral arteries in the neck. COMPARISON:  None. FINDINGS: Criteria: Quantification of carotid stenosis is based on velocity parameters that correlate the residual internal carotid diameter with NASCET-based  stenosis levels, using the diameter of the distal internal carotid lumen as the denominator for stenosis measurement. The following velocity measurements were obtained: RIGHT ICA: 53/17 cm/sec CCA: 55/12 cm/sec SYSTOLIC ICA/CCA RATIO:  1.0 ECA: 77 cm/sec LEFT ICA: 86/28 cm/sec CCA: 58/16 cm/sec SYSTOLIC ICA/CCA RATIO:  1.5 ECA: 59 cm/sec RIGHT CAROTID ARTERY: Mild heterogeneous atheromatous plaque noted at the right ICA origin. RIGHT VERTEBRAL ARTERY:  Antegrade flow. LEFT CAROTID ARTERY: Moderate predominantly noncalcified plaque noted at the carotid bifurcation. LEFT VERTEBRAL ARTERY:  Antegrade flow. IMPRESSION: Left greater than right carotid bifurcation atheromatous plaque with Doppler measurements indicative of less than 50% stenosis. Electronically Signed   By: Acquanetta Belling M.D.   On: 10/28/2020 09:49   CT ANGIO CHEST AORTA W/CM & OR WO/CM  Result Date: 10/27/2020 CLINICAL DATA:  Chest pain or back pain, aortic dissection suspected eval for dissection or PE Altered mental status and chest pain. EXAM: CT ANGIOGRAPHY CHEST WITH CONTRAST TECHNIQUE: Noncontrast CT of the chest was obtained initially. Multidetector CT imaging of the chest was performed using the standard protocol during bolus administration of intravenous contrast. Multiplanar CT  image reconstructions and MIPs were obtained to evaluate the vascular anatomy. CONTRAST:  OMNIPAQUE IOHEXOL 350 MG/ML SOLN COMPARISON:  Radiograph earlier today. FINDINGS: Cardiovascular: No evidence of high-density aortic hematoma on noncontrast chest CT. There is moderate atherosclerosis. No aneurysm. After the administration of IV contrast there is no evidence of dissection or acute aortic findings. Atherosclerotic plaque which is both calcified and noncalcified, including irregular plaque in the distal descending aorta. Conventional branch pattern from the aortic arch. Atherosclerotic plaque causes short segment approximately 50% luminal narrowing of the left subclavian artery, series 5, image 15. excellent evaluation for pulmonary embolus. There are no filling defects within the pulmonary arteries to suggest pulmonary embolus. Heart is normal in size. There are coronary artery calcifications. No pericardial effusion. Mediastinum/Nodes: No enlarged mediastinal or hilar lymph nodes. Small hiatal hernia. No esophageal wall thickening. No visualized thyroid nodule. Left thyroid lobe is slightly atrophic. Lungs/Pleura: Mild to moderate emphysema. Mild central bronchial wall thickening. Bandlike opacities in the anterior right middle lobe and lingula are typical of chronic atelectasis. No confluent airspace disease or pneumonia. There is a 3 mm right upper lobe pulmonary nodule, series 6, image 32. No dominant pulmonary mass. No pleural fluid. No findings of pulmonary edema. Upper Abdomen: No acute or unexpected finding.  Small hiatal hernia. Musculoskeletal: Thoracic spondylosis with disc space narrowing and endplate spurring. Post median sternotomy. There is a 2.6 cm lobulated left breast lesion with single peripheral calcification in the inferior breast. Review of the MIP images confirms the above findings. IMPRESSION: 1. No aortic dissection or acute aortic findings. No pulmonary embolus. 2. Moderate  emphysema with mild central bronchial wall thickening. 3. Bandlike opacities in the lingula and right middle lobe typical of chronic atelectasis. 4. Coronary artery calcifications. 5. Lobulated 2.6 cm left breast lesion with single peripheral calcification. Recommend up-to-date mammography. 6. Right upper lobe 3 mm pulmonary nodule. No follow-up needed if patient is low-risk. Non-contrast chest CT can be considered in 12 months if patient is high-risk. This recommendation follows the consensus statement: Guidelines for Management of Incidental Pulmonary Nodules Detected on CT Images: From the Fleischner Society 2017; Radiology 2017; 284:228-243. 7. Small hiatal hernia. 8. Short segment approximately 50% luminal narrowing of the left subclavian due to atheromatous plaque. Aortic Atherosclerosis (ICD10-I70.0) and Emphysema (ICD10-J43.9). Electronically Signed   By: Narda Rutherford M.D.   On:  10/27/2020 19:41   ECHOCARDIOGRAM COMPLETE  Result Date: 10/29/2020    ECHOCARDIOGRAM REPORT   Patient Name:   DELISHA PEADEN Date of Exam: 10/29/2020 Medical Rec #:  [id number]    Height:       61.0 in Accession #:    [id number]   Weight:       134.6 lb Date of Birth:  1945/12/16   BSA:          1.596 m Patient Age:    74 years     BP:           156/74 mmHg Patient Gender: F            HR:           64 bpm. Exam Location:  [facility] Procedure: 2D Echo, 3D Echo and Strain Analysis Indications:     Chest Pain R07.9  History:         Patient has no prior history of Echocardiogram examinations.  Sonographer:     Overton Mam RDCS Referring Phys:  [id number] Sondra Barges Diagnosing Phys: Jodelle Red MD IMPRESSIONS  1. Left ventricular ejection fraction, by estimation, is 45 to 50%. The left ventricle has mildly decreased function. The left ventricle demonstrates regional wall motion abnormalities (see scoring diagram/findings for description). There is mild asymmetric left ventricular hypertrophy of the basal-septal segment.  Left ventricular diastolic parameters are consistent with Grade II diastolic dysfunction (pseudonormalization). Elevated left ventricular end-diastolic pressure. There is mild hypokinesis of the left ventricular, basal-mid inferior wall and inferolateral wall. The average left ventricular global longitudinal strain is -12.4 %. The global longitudinal strain is abnormal.  2. Right ventricular systolic function is normal. The right ventricular size is normal. There is mildly elevated pulmonary artery systolic pressure. The estimated right ventricular systolic pressure is 43.2 mmHg.  3. Left atrial size was severely dilated.  4. The mitral valve is abnormal. Moderate mitral valve regurgitation. Mild mitral stenosis. Moderate mitral annular calcification.  5. The aortic valve is tricuspid. There is mild calcification of the aortic valve. There is mild thickening of the aortic valve. Aortic valve regurgitation is not visualized. Mild aortic valve sclerosis is present, with no evidence of aortic valve stenosis.  6. The inferior vena cava is normal in size with greater than 50% respiratory variability, suggesting right atrial pressure of 3 mmHg. Comparison(s): No prior Echocardiogram. FINDINGS  Left Ventricle: Left ventricular ejection fraction, by estimation, is 45 to 50%. The left ventricle has mildly decreased function. The left ventricle demonstrates regional wall motion abnormalities. Mild hypokinesis of the left ventricular, basal-mid inferior wall and inferolateral wall. The average left ventricular global longitudinal strain is -12.4 %. The global longitudinal strain is abnormal. The left ventricular internal cavity size was normal in size. There is mild asymmetric left ventricular hypertrophy of the basal-septal segment. Left ventricular diastolic parameters are consistent with Grade II diastolic dysfunction (pseudonormalization). Elevated left ventricular end-diastolic pressure. Right Ventricle: The right  ventricular size is normal. No increase in right ventricular wall thickness. Right ventricular systolic function is normal. There is mildly elevated pulmonary artery systolic pressure. The tricuspid regurgitant velocity is 3.17  m/s, and with an assumed right atrial pressure of 3 mmHg, the estimated right ventricular systolic pressure is 43.2 mmHg. Left Atrium: Left atrial size was severely dilated. Right Atrium: Right atrial size was normal in size. Pericardium: There is no evidence of pericardial effusion. Mitral Valve: The mitral valve is abnormal. There is moderate thickening of the mitral valve  leaflet(s). There is moderate calcification of the mitral valve leaflet(s). Moderate mitral annular calcification. Moderate mitral valve regurgitation. Mild mitral valve stenosis. MV peak gradient, 14.1 mmHg. The mean mitral valve gradient is 5.0 mmHg with average heart rate of 62 bpm. Tricuspid Valve: The tricuspid valve is normal in structure. Tricuspid valve regurgitation is trivial. No evidence of tricuspid stenosis. Aortic Valve: The aortic valve is tricuspid. There is mild calcification of the aortic valve. There is mild thickening of the aortic valve. Aortic valve regurgitation is not visualized. Mild aortic valve sclerosis is present, with no evidence of aortic valve stenosis. Aortic valve peak gradient measures 11.6 mmHg. Pulmonic Valve: The pulmonic valve was grossly normal. Pulmonic valve regurgitation is trivial. No evidence of pulmonic stenosis. Aorta: The aortic root, ascending aorta, aortic arch and descending aorta are all structurally normal, with no evidence of dilitation or obstruction. Venous: The inferior vena cava is normal in size with greater than 50% respiratory variability, suggesting right atrial pressure of 3 mmHg. IAS/Shunts: The atrial septum is grossly normal.  LEFT VENTRICLE PLAX 2D LVIDd:         6.10 cm      Diastology LVIDs:         4.80 cm      LV e' medial:    5.44 cm/s LV PW:          1.30 cm      LV E/e' medial:  25.0 LV IVS:        0.90 cm      LV e' lateral:   3.59 cm/s LVOT diam:     1.80 cm      LV E/e' lateral: 37.9 LV SV:         54 LV SV Index:   34           2D Longitudinal Strain LVOT Area:     2.54 cm     2D Strain GLS (A2C):   -13.7 %                             2D Strain GLS (A3C):   -12.5 %                             2D Strain GLS (A4C):   -10.9 % LV Volumes (MOD)            2D Strain GLS Avg:     -12.4 % LV vol d, MOD A2C: 69.1 ml LV vol d, MOD A4C: 109.0 ml LV vol s, MOD A2C: 31.0 ml LV vol s, MOD A4C: 39.1 ml  3D Volume EF: LV SV MOD A2C:     38.1 ml  3D EF:        45 % LV SV MOD A4C:     109.0 ml LV EDV:       136 ml LV SV MOD BP:      54.9 ml  LV ESV:       74 ml                             LV SV:        61 ml RIGHT VENTRICLE RV Basal diam:  3.30 cm RV S prime:     9.90 cm/s TAPSE (M-mode): 1.8 cm LEFT ATRIUM  Index       RIGHT ATRIUM          Index LA diam:        5.10 cm 3.20 cm/m  RA Area:     7.22 cm LA Vol (A2C):   83.4 ml 52.25 ml/m RA Volume:   13.70 ml 8.58 ml/m LA Vol (A4C):   91.2 ml 57.14 ml/m LA Biplane Vol: 91.3 ml 57.20 ml/m  AORTIC VALVE                PULMONIC VALVE AV Area (Vmax): 1.69 cm    PV Vmax:          0.94 m/s AV Vmax:        170.00 cm/s PV Peak grad:     3.5 mmHg AV Peak Grad:   11.6 mmHg   PR End Diast Vel: 7.84 msec LVOT Vmax:      113.00 cm/s LVOT Vmean:     63.000 cm/s LVOT VTI:       0.211 m  AORTA Ao Root diam: 2.90 cm Ao Asc diam:  2.90 cm MITRAL VALVE                 TRICUSPID VALVE MV Area (PHT): 1.71 cm      TV Peak grad:   24.9 mmHg MV Area VTI:   0.89 cm      TV Vmax:        2.50 m/s MV Peak grad:  14.1 mmHg     TR Peak grad:   40.2 mmHg MV Mean grad:  5.0 mmHg      TR Vmax:        317.00 cm/s MV Vmax:       1.88 m/s MV Vmean:      96.7 cm/s     SHUNTS MV Decel Time: 444 msec      Systemic VTI:  0.21 m MR Peak grad:    142.1 mmHg  Systemic Diam: 1.80 cm MR Mean grad:    91.0 mmHg MR Vmax:         596.00 cm/s MR Vmean:         448.0 cm/s MR PISA:         5.09 cm MR PISA Eff ROA: 26 mm MR PISA Radius:  0.90 cm MV E velocity: 136.00 cm/s MV A velocity: 103.00 cm/s MV E/A ratio:  1.32 Jodelle Red MD Electronically signed by Jodelle Red MD Signature Date/Time: 10/29/2020/1:34:42 PM    Final     Scheduled Meds:  aspirin  81 mg Oral Daily   atorvastatin  80 mg Oral Daily   insulin aspart  0-9 Units Subcutaneous TID WC   losartan  12.5 mg Oral Daily   metoprolol tartrate  25 mg Oral BID   potassium chloride  40 mEq Oral BID   sodium chloride flush  3 mL Intravenous Q12H   ticagrelor  90 mg Oral BID   Continuous Infusions:  sodium chloride Stopped (10/28/20 0740)   sodium chloride     cefTRIAXone (ROCEPHIN)  IV Stopped (10/28/20 2249)     LOS: 2 days   Time spent: 38 minutes. More than 50% of the time was spent in counseling/coordination of care  Arnetha Courser, MD [facility]  If 7PM-7AM, please contact night-coverage [email or web address]  10/29/2020, 2:08 PM   This record has been created using Dragon voice recognition software. Errors have been sought and corrected,but may not always be located. Such creation errors do not reflect  on the standard of care.

## 2020-10-30 DIAGNOSIS — I214 Non-ST elevation (NSTEMI) myocardial infarction: Secondary | ICD-10-CM | POA: Diagnosis not present

## 2020-10-30 LAB — BASIC METABOLIC PANEL
Anion gap: 7 (ref 5–15)
BUN: 13 mg/dL (ref 8–23)
CO2: 24 mmol/L (ref 22–32)
Calcium: 8.4 mg/dL — ABNORMAL LOW (ref 8.9–10.3)
Chloride: 104 mmol/L (ref 98–111)
Creatinine, Ser: 0.54 mg/dL (ref 0.44–1.00)
GFR, Estimated: >60 mL/min (ref >60)
Glucose, Bld: 143 mg/dL — ABNORMAL HIGH (ref 70–99)
Potassium: 3.9 mmol/L (ref 3.5–5.1)
Sodium: 135 mmol/L (ref 135–145)

## 2020-10-30 LAB — GLUCOSE, CAPILLARY: Glucose-Capillary: 147 mg/dL — ABNORMAL HIGH (ref 70–99)

## 2020-10-30 LAB — POCT ACTIVATED CLOTTING TIME: Activated Clotting Time: 341 seconds

## 2020-10-30 MED ORDER — ATORVASTATIN CALCIUM 80 MG PO TABS: 80.0000 mg | ORAL_TABLET | Freq: Every day | ORAL | 1 refills | Status: DC

## 2020-10-30 MED ORDER — LOSARTAN POTASSIUM 25 MG PO TABS: 12.5000 mg | ORAL_TABLET | Freq: Every day | ORAL | 1 refills | Status: DC

## 2020-10-30 MED ORDER — TICAGRELOR 90 MG PO TABS: 90.0000 mg | ORAL_TABLET | Freq: Two times a day (BID) | ORAL | 11 refills | Status: DC

## 2020-10-30 MED ORDER — METOPROLOL SUCCINATE ER 50 MG PO TB24: 50.0000 mg | ORAL_TABLET | Freq: Every day | ORAL | 11 refills | Status: DC

## 2020-10-30 NOTE — Discharge Summary (Signed)
Physician Discharge Summary  Amra Shukla ZOX:[id number] DOB: 1945-12-02 DOA: 10/27/2020  PCP: Pcp, No  Admit date: 10/27/2020 Discharge date: 10/30/2020  Admitted From: Home Disposition: Home  Recommendations for Outpatient Follow-up:  Follow up with PCP in 1-2 weeks Follow-up with cardiology Please obtain BMP/CBC in one week Please follow up on the following pending results: None  Home Health: No Equipment/Devices: None Discharge Condition: Stable CODE STATUS: Full Diet recommendation: Heart Healthy   Brief/Interim Summary: Penny Mccullough is a 75 y.o. female with medical history significant for history of remote bypass x1 in the 90s who presents with concerns of weakness and fatigue.  Patient has not seen physician in years.   Apparently on 9/5 after coming back from a funeral she developed midsternal chest pain which lasted for couple of hours and improved with rest.  Chest pain was associated with nausea and vomiting.  Since then she was feeling weak and fatigued.  Worsening exertional dyspnea.  She came to ED at family's request yesterday.  EKG concerning for inferolateral ischemia, troponin significantly elevated at 12,616-concerning for NSTEMI.  Patient was started on heparin infusion and cardiology was consulted. Patient underwent cardiac catheterization which shows a 90% stenosis of mid LAD and 75% of proximal LAD, s/p PCI with 2 drug-eluting stents placement.  Patient will continue with aspirin and Brilinta for 1 year.   Echocardiogram with EF of 40 to 45% and regional wall motion abnormalities including inferior and inferolateral area.  She was also started on low-dose losartan and metoprolol-doses will be monitored and titrated as an outpatient by cardiology.  Patient was also given referral for cardiac rehab.  CT aortic chest showed no PE or dissection but had incidental finding of left breast lesion, which will need further investigation with mammogram as an outpatient. There  was also a 3 mm nodule on right upper lobe.  Patient is low risk as last tobacco use was 30+ years ago.  No further imaging recommended at this time.  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  There was some concern of UTI on UA.  She received 3 doses of Rocephin.  Urine cultures with insignificant growth and there is no need for continuation of antibiotics at this time.  She was also found to have mild hypokalemia which were repleted before discharge.  She had dyslipidemia with markedly elevated LDL with a goal of less than 70.  She was started on high-dose statin and will follow-up with her providers for further monitoring and recommendations.  Patient will continue current medications and follow-up closely with cardiology. Patient needs to get established with a PCP for further surveillance and management.  Discharge Diagnoses:  Principal Problem:   NSTEMI (non-ST elevated myocardial infarction) [facility]) Active Problems:   Acute lower UTI   Lacunar infarction (HCC)   Hyperglycemia   Breast lesion   Pulmonary nodule   Discharge Instructions  Discharge Instructions     AMB Referral to Cardiac Rehabilitation - Phase II   Complete by: As directed    Diagnosis:  STEMI Coronary Stents     After initial evaluation and assessments completed: Virtual Based Care may be provided alone or in conjunction with Phase 2 Cardiac Rehab based on patient barriers.: Yes   Diet - low sodium heart healthy   Complete by: As directed    Discharge instructions   Complete by: As directed    It was pleasure taking care of you. You are being started on multiple new  medications for your heart, please take it as directed and follow-up closely with your cardiologist for further recommendations.   Increase activity slowly   Complete by: As directed       Allergies as of 10/30/2020       Reactions   Codeine Nausea And Vomiting        Medication List      TAKE these medications    aspirin 81 MG EC tablet Take 81 mg by mouth daily.   atorvastatin 80 MG tablet Commonly known as: LIPITOR Take 1 tablet (80 mg total) by mouth daily.   losartan 25 MG tablet Commonly known as: COZAAR Take 0.5 tablets (12.5 mg total) by mouth daily.   metoprolol succinate 50 MG 24 hr tablet Commonly known as: Toprol XL Take 1 tablet (50 mg total) by mouth daily. Take with or immediately following a meal.   ticagrelor 90 MG Tabs tablet Commonly known as: BRILINTA Take 1 tablet (90 mg total) by mouth 2 (two) times daily.        Follow-up Information     Gollan, Tollie Pizza, MD. Schedule an appointment as soon as possible for a visit in 1 week(s).   Specialty: Cardiology Contact information: [address] [place] [address] [phone]                Allergies  Allergen Reactions   Codeine Nausea And Vomiting    Consultations: Cardiology  Procedures/Studies: DG Chest 2 View  Result Date: 10/27/2020 CLINICAL DATA:  Chest pain abnormal EKG EXAM: CHEST - 2 VIEW COMPARISON:  None. FINDINGS: Post sternotomy changes. Mild diffuse bronchitic changes. No pleural effusion. Patchy lingular and right middle lobe opacity. Cardiac size within normal limits. Aortic atherosclerosis. No pneumothorax IMPRESSION: 1. Patchy lingular and right middle lobe opacity which may be due to atelectasis, scar, or minimal infiltrate. Electronically Signed   By: Jasmine Pang M.D.   On: 10/27/2020 18:21   CT HEAD WO CONTRAST ( )  Result Date: 10/27/2020 CLINICAL DATA:  Mental status change. EXAM: CT HEAD WITHOUT CONTRAST TECHNIQUE: Contiguous axial images were obtained from the base of the skull through the vertex without intravenous contrast. COMPARISON:  None. FINDINGS: Brain: Within the left basal ganglia there is a focal area of low attenuation measuring 1 cm, image 14/3. This is consistent with a subacute to chronic lacunar infarct. No signs of  acute intracranial hemorrhage, mass or abnormal extra-axial fluid collection. Vascular: No hyperdense vessel or unexpected calcification. Skull: Normal. Negative for fracture or focal lesion. Sinuses/Orbits: No acute finding. Other: None. IMPRESSION: 1. No acute intracranial abnormalities. 2. 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 Electronically Signed   By: Signa Kell M.D.   On: 10/27/2020 14:14   CARDIAC CATHETERIZATION  Result Date: 10/28/2020   Ost LAD lesion is 75% stenosed.   Mid LAD lesion is 99% stenosed.   A drug-eluting stent was successfully placed using a STENT ONYX FRONTIER 2.5X15.   A drug-eluting stent was successfully placed using a STENT ONYX FRONTIER 2.5X30.   A drug-eluting stent was successfully placed using a STENT ONYX FRONTIER 2.5X30.   Post intervention, there is a 0% residual stenosis.   Post intervention, there is a 0% residual stenosis.   The left ventricular systolic function is normal.   LV end diastolic pressure is normal. Conclusion Cardiac cath Left main large free of disease Left ventricular function normal a EF of around 55% mild apical hypo- LAD large 75% ostial lesion  99% mid lesion ulcerated TIMI-3 flow Circumflex large left dominant free of disease RCA nondominant Intervention PCI and stent to mid LAD with DES 2.5 x 30 mm DES PCI and stent to ostial LAD 2.5 x 15 mm DES Continue aspirin Brilinta for at least 12 months Angiomax for 2 hours then discontinue Cardiac care transferred back to [facility]. Dr Mariah Milling   US Carotid Bilateral  Result Date: 10/28/2020 CLINICAL DATA:  Stroke EXAM: BILATERAL CAROTID DUPLEX ULTRASOUND TECHNIQUE: Wallace Cullens scale imaging, color Doppler and duplex ultrasound were performed of bilateral carotid and vertebral arteries in the neck. COMPARISON:  None. FINDINGS: Criteria: Quantification of carotid stenosis is based on velocity parameters that correlate the residual internal carotid diameter with NASCET-based stenosis levels, using the  diameter of the distal internal carotid lumen as the denominator for stenosis measurement. The following velocity measurements were obtained: RIGHT ICA: 53/17 cm/sec CCA: 55/12 cm/sec SYSTOLIC ICA/CCA RATIO:  1.0 ECA: 77 cm/sec LEFT ICA: 86/28 cm/sec CCA: 58/16 cm/sec SYSTOLIC ICA/CCA RATIO:  1.5 ECA: 59 cm/sec RIGHT CAROTID ARTERY: Mild heterogeneous atheromatous plaque noted at the right ICA origin. RIGHT VERTEBRAL ARTERY:  Antegrade flow. LEFT CAROTID ARTERY: Moderate predominantly noncalcified plaque noted at the carotid bifurcation. LEFT VERTEBRAL ARTERY:  Antegrade flow. IMPRESSION: Left greater than right carotid bifurcation atheromatous plaque with Doppler measurements indicative of less than 50% stenosis. Electronically Signed   By: Acquanetta Belling M.D.   On: 10/28/2020 09:49   CT ANGIO CHEST AORTA W/CM & OR WO/CM  Result Date: 10/27/2020 CLINICAL DATA:  Chest pain or back pain, aortic dissection suspected eval for dissection or PE Altered mental status and chest pain. EXAM: CT ANGIOGRAPHY CHEST WITH CONTRAST TECHNIQUE: Noncontrast CT of the chest was obtained initially. Multidetector CT imaging of the chest was performed using the standard protocol during bolus administration of intravenous contrast. Multiplanar CT image reconstructions and MIPs were obtained to evaluate the vascular anatomy. CONTRAST:  OMNIPAQUE IOHEXOL 350 MG/ML SOLN COMPARISON:  Radiograph earlier today. FINDINGS: Cardiovascular: No evidence of high-density aortic hematoma on noncontrast chest CT. There is moderate atherosclerosis. No aneurysm. After the administration of IV contrast there is no evidence of dissection or acute aortic findings. Atherosclerotic plaque which is both calcified and noncalcified, including irregular plaque in the distal descending aorta. Conventional branch pattern from the aortic arch. Atherosclerotic plaque causes short segment approximately 50% luminal narrowing of the left subclavian artery, series  5, image 15. excellent evaluation for pulmonary embolus. There are no filling defects within the pulmonary arteries to suggest pulmonary embolus. Heart is normal in size. There are coronary artery calcifications. No pericardial effusion. Mediastinum/Nodes: No enlarged mediastinal or hilar lymph nodes. Small hiatal hernia. No esophageal wall thickening. No visualized thyroid nodule. Left thyroid lobe is slightly atrophic. Lungs/Pleura: Mild to moderate emphysema. Mild central bronchial wall thickening. Bandlike opacities in the anterior right middle lobe and lingula are typical of chronic atelectasis. No confluent airspace disease or pneumonia. There is a 3 mm right upper lobe pulmonary nodule, series 6, image 32. No dominant pulmonary mass. No pleural fluid. No findings of pulmonary edema. Upper Abdomen: No acute or unexpected finding.  Small hiatal hernia. Musculoskeletal: Thoracic spondylosis with disc space narrowing and endplate spurring. Post median sternotomy. There is a 2.6 cm lobulated left breast lesion with single peripheral calcification in the inferior breast. Review of the MIP images confirms the above findings. IMPRESSION: 1. No aortic dissection or acute aortic findings. No pulmonary embolus. 2. Moderate emphysema with mild central bronchial wall  thickening. 3. Bandlike opacities in the lingula and right middle lobe typical of chronic atelectasis. 4. Coronary artery calcifications. 5. Lobulated 2.6 cm left breast lesion with single peripheral calcification. Recommend up-to-date mammography. 6. Right upper lobe 3 mm pulmonary nodule. No follow-up needed if patient is low-risk. Non-contrast chest CT can be considered in 12 months if patient is high-risk. This recommendation follows the consensus statement: Guidelines for Management of Incidental Pulmonary Nodules Detected on CT Images: From the Fleischner Society 2017; Radiology 2017; 284:228-243. 7. Small hiatal hernia. 8. Short segment approximately  50% luminal narrowing of the left subclavian due to atheromatous plaque. Aortic Atherosclerosis (ICD10-I70.0) and Emphysema (ICD10-J43.9). Electronically Signed   By: Narda Rutherford M.D.   On: 10/27/2020 19:41   ECHOCARDIOGRAM COMPLETE  Result Date: 10/29/2020    ECHOCARDIOGRAM REPORT   Patient Name:   REX MAGEE Date of Exam: 10/29/2020 Medical Rec #:  [id number]    Height:       61.0 in Accession #:    [id number]   Weight:       134.6 lb Date of Birth:  1945/10/25   BSA:          1.596 m Patient Age:    74 years     BP:           156/74 mmHg Patient Gender: F            HR:           64 bpm. Exam Location:  [facility] Procedure: 2D Echo, 3D Echo and Strain Analysis Indications:     Chest Pain R07.9  History:         Patient has no prior history of Echocardiogram examinations.  Sonographer:     Overton Mam RDCS Referring Phys:  [id number] Sondra Barges Diagnosing Phys: Jodelle Red MD IMPRESSIONS  1. Left ventricular ejection fraction, by estimation, is 45 to 50%. The left ventricle has mildly decreased function. The left ventricle demonstrates regional wall motion abnormalities (see scoring diagram/findings for description). There is mild asymmetric left ventricular hypertrophy of the basal-septal segment. Left ventricular diastolic parameters are consistent with Grade II diastolic dysfunction (pseudonormalization). Elevated left ventricular end-diastolic pressure. There is mild hypokinesis of the left ventricular, basal-mid inferior wall and inferolateral wall. The average left ventricular global longitudinal strain is -12.4 %. The global longitudinal strain is abnormal.  2. Right ventricular systolic function is normal. The right ventricular size is normal. There is mildly elevated pulmonary artery systolic pressure. The estimated right ventricular systolic pressure is 43.2 mmHg.  3. Left atrial size was severely dilated.  4. The mitral valve is abnormal. Moderate mitral valve regurgitation. Mild mitral  stenosis. Moderate mitral annular calcification.  5. The aortic valve is tricuspid. There is mild calcification of the aortic valve. There is mild thickening of the aortic valve. Aortic valve regurgitation is not visualized. Mild aortic valve sclerosis is present, with no evidence of aortic valve stenosis.  6. The inferior vena cava is normal in size with greater than 50% respiratory variability, suggesting right atrial pressure of 3 mmHg. Comparison(s): No prior Echocardiogram. FINDINGS  Left Ventricle: Left ventricular ejection fraction, by estimation, is 45 to 50%. The left ventricle has mildly decreased function. The left ventricle demonstrates regional wall motion abnormalities. Mild hypokinesis of the left ventricular, basal-mid inferior wall and inferolateral wall. The average left ventricular global longitudinal strain is -12.4 %. The global longitudinal strain is abnormal. The left ventricular internal cavity size was normal in size. There  is mild asymmetric left ventricular hypertrophy of the basal-septal segment. Left ventricular diastolic parameters are consistent with Grade II diastolic dysfunction (pseudonormalization). Elevated left ventricular end-diastolic pressure. Right Ventricle: The right ventricular size is normal. No increase in right ventricular wall thickness. Right ventricular systolic function is normal. There is mildly elevated pulmonary artery systolic pressure. The tricuspid regurgitant velocity is 3.17  m/s, and with an assumed right atrial pressure of 3 mmHg, the estimated right ventricular systolic pressure is 43.2 mmHg. Left Atrium: Left atrial size was severely dilated. Right Atrium: Right atrial size was normal in size. Pericardium: There is no evidence of pericardial effusion. Mitral Valve: The mitral valve is abnormal. There is moderate thickening of the mitral valve leaflet(s). There is moderate calcification of the mitral valve leaflet(s). Moderate mitral annular  calcification. Moderate mitral valve regurgitation. Mild mitral valve stenosis. MV peak gradient, 14.1 mmHg. The mean mitral valve gradient is 5.0 mmHg with average heart rate of 62 bpm. Tricuspid Valve: The tricuspid valve is normal in structure. Tricuspid valve regurgitation is trivial. No evidence of tricuspid stenosis. Aortic Valve: The aortic valve is tricuspid. There is mild calcification of the aortic valve. There is mild thickening of the aortic valve. Aortic valve regurgitation is not visualized. Mild aortic valve sclerosis is present, with no evidence of aortic valve stenosis. Aortic valve peak gradient measures 11.6 mmHg. Pulmonic Valve: The pulmonic valve was grossly normal. Pulmonic valve regurgitation is trivial. No evidence of pulmonic stenosis. Aorta: The aortic root, ascending aorta, aortic arch and descending aorta are all structurally normal, with no evidence of dilitation or obstruction. Venous: The inferior vena cava is normal in size with greater than 50% respiratory variability, suggesting right atrial pressure of 3 mmHg. IAS/Shunts: The atrial septum is grossly normal.  LEFT VENTRICLE PLAX 2D LVIDd:         6.10 cm      Diastology LVIDs:         4.80 cm      LV e' medial:    5.44 cm/s LV PW:         1.30 cm      LV E/e' medial:  25.0 LV IVS:        0.90 cm      LV e' lateral:   3.59 cm/s LVOT diam:     1.80 cm      LV E/e' lateral: 37.9 LV SV:         54 LV SV Index:   34           2D Longitudinal Strain LVOT Area:     2.54 cm     2D Strain GLS (A2C):   -13.7 %                             2D Strain GLS (A3C):   -12.5 %                             2D Strain GLS (A4C):   -10.9 % LV Volumes (MOD)            2D Strain GLS Avg:     -12.4 % LV vol d, MOD A2C: 69.1 ml LV vol d, MOD A4C: 109.0 ml LV vol s, MOD A2C: 31.0 ml LV vol s, MOD A4C: 39.1 ml  3D Volume EF: LV SV MOD A2C:     38.1 ml  3D EF:        45 % LV SV MOD A4C:     109.0 ml LV EDV:       136 ml LV SV MOD BP:      54.9 ml  LV ESV:        74 ml                             LV SV:        61 ml RIGHT VENTRICLE RV Basal diam:  3.30 cm RV S prime:     9.90 cm/s TAPSE (M-mode): 1.8 cm LEFT ATRIUM             Index       RIGHT ATRIUM          Index LA diam:        5.10 cm 3.20 cm/m  RA Area:     7.22 cm LA Vol (A2C):   83.4 ml 52.25 ml/m RA Volume:   13.70 ml 8.58 ml/m LA Vol (A4C):   91.2 ml 57.14 ml/m LA Biplane Vol: 91.3 ml 57.20 ml/m  AORTIC VALVE                PULMONIC VALVE AV Area (Vmax): 1.69 cm    PV Vmax:          0.94 m/s AV Vmax:        170.00 cm/s PV Peak grad:     3.5 mmHg AV Peak Grad:   11.6 mmHg   PR End Diast Vel: 7.84 msec LVOT Vmax:      113.00 cm/s LVOT Vmean:     63.000 cm/s LVOT VTI:       0.211 m  AORTA Ao Root diam: 2.90 cm Ao Asc diam:  2.90 cm MITRAL VALVE                 TRICUSPID VALVE MV Area (PHT): 1.71 cm      TV Peak grad:   24.9 mmHg MV Area VTI:   0.89 cm      TV Vmax:        2.50 m/s MV Peak grad:  14.1 mmHg     TR Peak grad:   40.2 mmHg MV Mean grad:  5.0 mmHg      TR Vmax:        317.00 cm/s MV Vmax:       1.88 m/s MV Vmean:      96.7 cm/s     SHUNTS MV Decel Time: 444 msec      Systemic VTI:  0.21 m MR Peak grad:    142.1 mmHg  Systemic Diam: 1.80 cm MR Mean grad:    91.0 mmHg MR Vmax:         596.00 cm/s MR Vmean:        448.0 cm/s MR PISA:         5.09 cm MR PISA Eff ROA: 26 mm MR PISA Radius:  0.90 cm MV E velocity: 136.00 cm/s MV A velocity: 103.00 cm/s MV E/A ratio:  1.32 Jodelle Red MD Electronically signed by Jodelle Red MD Signature Date/Time: 10/29/2020/1:34:42 PM    Final     Subjective: Patient was seen and examined today.  Denies any chest pain or shortness of breath.  Wants to go home.  No acute concerns overnight.  Remained stable.  Discharge Exam: Vitals:   10/30/20 0421 10/30/20 9833  BP: (!) 155/73 123/75  Pulse: 71 63  Resp: 18 20  Temp: (!) 97.5 F (36.4 C) 97.9 F (36.6 C)  SpO2: 100% 100%   Vitals:   10/29/20 1704 10/29/20 1943 10/30/20 0421 10/30/20  0837  BP: (!) 148/76 (!) 155/69 (!) 155/73 123/75  Pulse: 64 77 71 63  Resp: 18 20 18 20   Temp: 97.8 F (36.6 C) 98.3 F (36.8 C) (!) 97.5 F (36.4 C) 97.9 F (36.6 C)  TempSrc: Oral Oral    SpO2: 97% 99% 100% 100%  Weight:      Height:        General: Pt is alert, awake, not in acute distress Cardiovascular: RRR, S1/S2 +, no rubs, no gallops Respiratory: CTA bilaterally, no wheezing, no rhonchi Abdominal: Soft, NT, ND, bowel sounds + Extremities: no edema, no cyanosis   The results of significant diagnostics from this hospitalization (including imaging, microbiology, ancillary and laboratory) are listed below for reference.    Microbiology: Recent Results (from the past 240 hour(s))  SARS CORONAVIRUS 2 (TAT 6-24 HRS) Nasopharyngeal Nasopharyngeal Swab     Status: None   Collection Time: 10/27/20  5:25 PM   Specimen: Nasopharyngeal Swab  Result Value Ref Range Status   SARS Coronavirus 2 NEGATIVE NEGATIVE Final    Comment: (NOTE) SARS-CoV-2 target nucleic acids are NOT DETECTED.  The SARS-CoV-2 RNA is generally detectable in upper and lower respiratory specimens during the acute phase of infection. Negative results do not preclude SARS-CoV-2 infection, do not rule out co-infections with other pathogens, and should not be used as the sole basis for treatment or other patient management decisions. Negative results must be combined with clinical observations, patient history, and epidemiological information. The expected result is Negative.  Fact Sheet for Patients: HairSlick.nohttps://www.fda.gov/media/138098/download  Fact Sheet for Healthcare Providers: quierodirigir.comhttps://www.fda.gov/media/138095/download  This test is not yet approved or cleared by the [place] FDA and  has been authorized for detection and/or diagnosis of SARS-CoV-2 by FDA under an Emergency Use Authorization (EUA). This EUA will remain  in effect (meaning this test can be used) for the duration of the COVID-19  declaration under Se ction 564(b)(1) of the Act, 21 U.S.C. section 360bbb-3(b)(1), unless the authorization is terminated or revoked sooner.  Performed at [facility], [address] [place], [place] [address]   Urine Culture     Status: Abnormal   Collection Time: 10/28/20 12:56 PM   Specimen: Urine, Clean Catch  Result Value Ref Range Status   Specimen Description   Final    URINE, CLEAN CATCH Performed at [facility], [address] [place], [place] [address]    Special Requests   Final    NONE Performed at [facility], [address] [place], [place] [address]    Culture MULTIPLE SPECIES PRESENT, SUGGEST RECOLLECTION (A)  Final   Report Status 10/29/2020 FINAL  Final     Labs: BNP (last 3 results) No results for input(s): BNP in the last 8760 hours. Basic Metabolic Panel: Recent Labs  Lab 10/27/20 1256 10/28/20 0014 10/29/20 0621 10/30/20 0453  NA 132* 133* 136 135  K 3.7 3.7 3.2* 3.9  CL 98 97* 106 104  CO2 21* 22 23 24   GLUCOSE 199* 129* 123* 143*  BUN 16 19 16 13   CREATININE 0.73 0.61 0.60 0.54  CALCIUM 9.2 9.4 8.4* 8.4*  MG  --   --  2.2  --    Liver Function Tests: Recent Labs  Lab  10/27/20 1256  AST 75*  ALT 35  ALKPHOS 117  BILITOT 1.7*  PROT 7.7  ALBUMIN 4.2   No results for input(s): LIPASE, AMYLASE in the last 168 hours. No results for input(s): AMMONIA in the last 168 hours. CBC: Recent Labs  Lab 10/27/20 1256 10/28/20 0014 10/28/20 0655 10/29/20 0621  WBC 16.1* 15.0* 13.5* 13.0*  NEUTROABS  --   --  9.9*  --   HGB 15.3* 14.5 14.0 13.1  HCT 44.5 41.7 40.6 38.8  MCV 88.3 89.3 89.6 89.4  PLT 386 354 351 312   Cardiac Enzymes: No results for input(s): CKTOTAL, CKMB, CKMBINDEX, TROPONINI in the last 168 hours. BNP: Invalid input(s): POCBNP CBG: Recent Labs  Lab 10/29/20 0745 10/29/20 1128 10/29/20 1708 10/29/20 2108 10/30/20 0839  GLUCAP 120* 111* 117* 134* 147*   D-Dimer Recent Labs     10/27/20 1300  DDIMER 0.98*   Hgb A1c Recent Labs    10/27/20 2158  HGBA1C 5.2   Lipid Profile Recent Labs    10/27/20 2158  CHOL 270*  HDL 47  LDLCALC 199*  TRIG 120  CHOLHDL 5.7   Thyroid function studies No results for input(s): TSH, T4TOTAL, T3FREE, THYROIDAB in the last 72 hours.  Invalid input(s): FREET3 Anemia work up No results for input(s): VITAMINB12, FOLATE, FERRITIN, TIBC, IRON, RETICCTPCT in the last 72 hours. Urinalysis    Component Value Date/Time   COLORURINE YELLOW (A) 10/27/2020 1256   APPEARANCEUR TURBID (A) 10/27/2020 1256   LABSPEC 1.030 10/27/2020 1256   PHURINE 6.0 10/27/2020 1256   GLUCOSEU NEGATIVE 10/27/2020 1256   HGBUR MODERATE (A) 10/27/2020 1256   BILIRUBINUR SMALL (A) 10/27/2020 1256   KETONESUR 15 (A) 10/27/2020 1256   PROTEINUR 100 (A) 10/27/2020 1256   NITRITE NEGATIVE 10/27/2020 1256   LEUKOCYTESUR SMALL (A) 10/27/2020 1256   Sepsis Labs Invalid input(s): PROCALCITONIN,  WBC,  LACTICIDVEN Microbiology Recent Results (from the past 240 hour(s))  SARS CORONAVIRUS 2 (TAT 6-24 HRS) Nasopharyngeal Nasopharyngeal Swab     Status: None   Collection Time: 10/27/20  5:25 PM   Specimen: Nasopharyngeal Swab  Result Value Ref Range Status   SARS Coronavirus 2 NEGATIVE NEGATIVE Final    Comment: (NOTE) SARS-CoV-2 target nucleic acids are NOT DETECTED.  The SARS-CoV-2 RNA is generally detectable in upper and lower respiratory specimens during the acute phase of infection. Negative results do not preclude SARS-CoV-2 infection, do not rule out co-infections with other pathogens, and should not be used as the sole basis for treatment or other patient management decisions. Negative results must be combined with clinical observations, patient history, and epidemiological information. The expected result is Negative.  Fact Sheet for Patients: HairSlick.no  Fact Sheet for Healthcare  Providers: quierodirigir.com  This test is not yet approved or cleared by the [place] FDA and  has been authorized for detection and/or diagnosis of SARS-CoV-2 by FDA under an Emergency Use Authorization (EUA). This EUA will remain  in effect (meaning this test can be used) for the duration of the COVID-19 declaration under Se ction 564(b)(1) of the Act, 21 U.S.C. section 360bbb-3(b)(1), unless the authorization is terminated or revoked sooner.  Performed at [facility], [address], [place], [place] [address]   Urine Culture     Status: Abnormal   Collection Time: 10/28/20 12:56 PM   Specimen: Urine, Clean Catch  Result Value Ref Range Status   Specimen Description   Final    URINE, CLEAN CATCH Performed  at [facility], [address], [place], [place] [address]    Special Requests   Final    NONE Performed at [facility] [place], [address] [place], [place] [address]    Culture MULTIPLE SPECIES PRESENT, SUGGEST RECOLLECTION (A)  Final   Report Status 10/29/2020 FINAL  Final    Time coordinating discharge: Over 30 minutes  SIGNED:  Arnetha Courser, MD  [facility] 10/30/2020, 10:50 AM  If 7PM-7AM, please contact night-coverage [email or web address]  This record has been created using Conservation officer, historic buildings. Errors have been sought and corrected,but may not always be located. Such creation errors do not reflect on the standard of care.

## 2020-10-30 NOTE — Progress Notes (Addendum)
Progress Note  Patient Name: Penny Mccullough Date of Encounter: 10/30/2020  Primary Cardiologist: New to [facility] - consult by [place]  Subjective   No chest pain, dyspnea, palpitations, dizziness, presyncope, or syncope. Ambulated without issues. No cath site complications.  he underwent LHC 9/9 with PCI to the ostial and mid LAD as outlined below. No issues from her cath site. Post cath renal function and HGB stable. BP 140s to 150s mmHg systolic. Post MI echo with EF 45-50% with hypokinesis of the basal-mid inferior and inferolateral wall.   Inpatient Medications    Scheduled Meds:  aspirin  81 mg Oral Daily   atorvastatin  80 mg Oral Daily   insulin aspart  0-9 Units Subcutaneous TID WC   losartan  12.5 mg Oral Daily   metoprolol tartrate  25 mg Oral BID   sodium chloride flush  3 mL Intravenous Q12H   ticagrelor  90 mg Oral BID   Continuous Infusions:  sodium chloride Stopped (10/28/20 0740)   sodium chloride     cefTRIAXone (ROCEPHIN)  IV Stopped (10/29/20 2206)   PRN Meds: sodium chloride, sodium chloride, acetaminophen, ondansetron (ZOFRAN) IV, sodium chloride flush   Vital Signs    Vitals:   10/29/20 1113 10/29/20 1704 10/29/20 1943 10/30/20 0421  BP: (!) 141/68 (!) 148/76 (!) 155/69 (!) 155/73  Pulse: 60 64 77 71  Resp: 18 18 20 18   Temp: 97.9 F (36.6 C) 97.8 F (36.6 C) 98.3 F (36.8 C) (!) 97.5 F (36.4 C)  TempSrc: Oral Oral Oral   SpO2: 100% 97% 99% 100%  Weight:      Height:        Intake/Output Summary (Last 24 hours) at 10/30/2020 0801 Last data filed at 10/30/2020 0530 Gross per 24 hour  Intake 1240 ml  Output 1525 ml  Net -285 ml   Filed Weights   10/27/20 1254 10/27/20 2123 10/28/20 1408  Weight: 61.2 kg 61.1 kg 61.1 kg    Telemetry    SR - Personally Reviewed  ECG    No new tracings - Personally Reviewed  Physical Exam   GEN: No acute distress.   Neck: No JVD. Cardiac: RRR, I/VI systolic murmur, no rubs, or gallops. Right  femoral cardiac cath site without bleeding, swelling, warmth, erythema, or TTP. No bruit.   Respiratory: Clear to auscultation bilaterally.  GI: Soft, nontender, non-distended.   MS: No edema; No deformity. Neuro:  Alert and oriented x 3; Nonfocal.  Psych: Normal affect.  Labs    Chemistry Recent Labs  Lab 10/27/20 1256 10/28/20 0014 10/29/20 0621 10/30/20 0453  NA 132* 133* 136 135  K 3.7 3.7 3.2* 3.9  CL 98 97* 106 104  CO2 21* 22 23 24   GLUCOSE 199* 129* 123* 143*  BUN 16 19 16 13   CREATININE 0.73 0.61 0.60 0.54  CALCIUM 9.2 9.4 8.4* 8.4*  PROT 7.7  --   --   --   ALBUMIN 4.2  --   --   --   AST 75*  --   --   --   ALT 35  --   --   --   ALKPHOS 117  --   --   --   BILITOT 1.7*  --   --   --   GFRNONAA >60 >60 >60 >60  ANIONGAP 13 14 7 7      Hematology Recent Labs  Lab 10/28/20 0014 10/28/20 0655 10/29/20 0621  WBC 15.0* 13.5* 13.0*  RBC 4.67 4.53 4.34  HGB 14.5 14.0 13.1  HCT 41.7 40.6 38.8  MCV 89.3 89.6 89.4  MCH 31.0 30.9 30.2  MCHC 34.8 34.5 33.8  RDW 12.9 13.0 12.9  PLT 354 351 312    Cardiac EnzymesNo results for input(s): TROPONINI in the last 168 hours. No results for input(s): TROPIPOC in the last 168 hours.   BNPNo results for input(s): BNP, PROBNP in the last 168 hours.   DDimer  Recent Labs  Lab 10/27/20 1300  DDIMER 0.98*     Radiology    DG Chest 2 View  Result Date: 10/27/2020 IMPRESSION: 1. Patchy lingular and right middle lobe opacity which may be due to atelectasis, scar, or minimal infiltrate. Electronically Signed   By: Jasmine Pang M.D.   On: 10/27/2020 18:21   CT HEAD WO CONTRAST ( )  Result Date: 10/27/2020 IMPRESSION: 1. No acute intracranial abnormalities. 2. 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 Electronically Signed   By: Signa Kell M.D.   On: 10/27/2020 14:14   US Carotid Bilateral  Result Date: 10/28/2020 IMPRESSION: Left greater than right carotid bifurcation atheromatous plaque with  Doppler measurements indicative of less than 50% stenosis. Electronically Signed   By: Acquanetta Belling M.D.   On: 10/28/2020 09:49   CT ANGIO CHEST AORTA W/CM & OR WO/CM  Result Date: 10/27/2020 IMPRESSION: 1. No aortic dissection or acute aortic findings. No pulmonary embolus. 2. Moderate emphysema with mild central bronchial wall thickening. 3. Bandlike opacities in the lingula and right middle lobe typical of chronic atelectasis. 4. Coronary artery calcifications. 5. Lobulated 2.6 cm left breast lesion with single peripheral calcification. Recommend up-to-date mammography. 6. Right upper lobe 3 mm pulmonary nodule. No follow-up needed if patient is low-risk. Non-contrast chest CT can be considered in 12 months if patient is high-risk. This recommendation follows the consensus statement: Guidelines for Management of Incidental Pulmonary Nodules Detected on CT Images: From the Fleischner Society 2017; Radiology 2017; 284:228-243. 7. Small hiatal hernia. 8. Short segment approximately 50% luminal narrowing of the left subclavian due to atheromatous plaque. Aortic Atherosclerosis (ICD10-I70.0) and Emphysema (ICD10-J43.9). Electronically Signed   By: Narda Rutherford M.D.   On: 10/27/2020 19:41    Cardiac Studies   LHC 10/28/2020:   Ost LAD lesion is 75% stenosed.   Mid LAD lesion is 99% stenosed.   A drug-eluting stent was successfully placed using a STENT ONYX FRONTIER 2.5X15.   A drug-eluting stent was successfully placed using a STENT ONYX FRONTIER 2.5X30.   A drug-eluting stent was successfully placed using a STENT ONYX FRONTIER 2.5X30.   Post intervention, there is a 0% residual stenosis.   Post intervention, there is a 0% residual stenosis.   The left ventricular systolic function is normal.   LV end diastolic pressure is normal.   Conclusion Cardiac cath Left main large free of disease Left ventricular function normal a EF of around 55% mild apical hypo- LAD large 75% ostial lesion 99% mid  lesion ulcerated TIMI-3 flow Circumflex large left dominant free of disease RCA nondominant   Intervention PCI and stent to mid LAD with DES 2.5 x 30 mm DES PCI and stent to ostial LAD 2.5 x 15 mm DES Continue aspirin Brilinta for at least 12 months Angiomax for 2 hours then discontinue Cardiac care transferred back to [facility]. Dr Mariah Milling  __________  2D echo 10/29/2020: 1. Left ventricular ejection fraction, by estimation, is 45 to 50%. The  left ventricle has mildly decreased  function. The left ventricle  demonstrates regional wall motion abnormalities (see scoring  diagram/findings for description). There is mild  asymmetric left ventricular hypertrophy of the basal-septal segment. Left  ventricular diastolic parameters are consistent with Grade II diastolic  dysfunction (pseudonormalization). Elevated left ventricular end-diastolic  pressure. There is mild  hypokinesis of the left ventricular, basal-mid inferior wall and  inferolateral wall. The average left ventricular global longitudinal  strain is -12.4 %. The global longitudinal strain is abnormal.   2. Right ventricular systolic function is normal. The right ventricular  size is normal. There is mildly elevated pulmonary artery systolic  pressure. The estimated right ventricular systolic pressure is 43.2 mmHg.   3. Left atrial size was severely dilated.   4. The mitral valve is abnormal. Moderate mitral valve regurgitation.  Mild mitral stenosis. Moderate mitral annular calcification.   5. The aortic valve is tricuspid. There is mild calcification of the  aortic valve. There is mild thickening of the aortic valve. Aortic valve  regurgitation is not visualized. Mild aortic valve sclerosis is present,  with no evidence of aortic valve  stenosis.   6. The inferior vena cava is normal in size with greater than 50%  respiratory variability, suggesting right atrial pressure of 3 mmHg.   Comparison(s): No prior Echocardiogram.    Patient Profile     75 y.o. female with history of CAD with remote CABG approximately 30 years ago (suspected to be a LIMA-LAD per history), CVA, HLD, and COPD with a long history of tobacco use who is being seen today for the evaluation of NSTEMI at the request of Dr. Cyndia Bent.  Assessment & Plan    1. NSTEMI: -No chest pain -[place] [address] -Status post PCI to the ostial and mid LAD -DAPT without interruption with ASA and Brilinta for at least the next 12 months (Brilinta card given to patient this morning) -Echo as above -Aggressive risk factor modification and secondary prevention  -Post cath instructions -Cardiac rehab  2. ICM: -Echo this admission with EF 45-50% with WMA as outlined above -Euvolemic and well compensated -Titrate losartan to 25 mg daily -Continue Lopressor -Escalate GDMT in the outpatient setting as vitals and labs allow  3. Abnormal chest CTA: -Will need follow up in the outpatient setting as per IM direction    4. COPD: -No active exacerbation  -Per IM   5. Leukocytosis: -No obvious infection -Likely inflammatory in the setting of #1   6. HLD: -LDL 199 this admission -Not on a statin prior to admission -Goal LDL < 70 -Continue Lipitor 80 mg  7. HTN: -BP elevated in the 140s to 150s mmHg systolic this morning -Meds as above   6. History of CVA: -No new deficits  -ASA and Lipitor as above  7. Hypokalemia: -Improved   Ok for discharge from our perspective today on current medications, once staffed by MD. I will arrange for follow up in our office.    For questions or updates, please contact [facility] Please consult [email or web address] for contact info under Cardiology/STEMI.    Signed, Eula Listen, PA-C [facility] Pager: [phone] 10/30/2020, 8:01 AM

## 2020-10-31 ENCOUNTER — Encounter: Payer: Self-pay | Admitting: Internal Medicine

## 2020-10-31 ENCOUNTER — Other Ambulatory Visit: Payer: Self-pay | Admitting: *Deleted

## 2020-10-31 DIAGNOSIS — Z955 Presence of coronary angioplasty implant and graft: Secondary | ICD-10-CM

## 2020-10-31 DIAGNOSIS — I214 Non-ST elevation (NSTEMI) myocardial infarction: Secondary | ICD-10-CM

## 2020-11-20 NOTE — Progress Notes (Signed)
Cardiology Office Note:    Date:  11/21/2020   ID:  Jannis Atkins, DOB Jun 27, 1945, MRN [id number]  PCP:  Pcp, No  [facility] Cardiologist:  Dr. [facility] Electrophysiologist:  None   Referring MD: No ref. provider found   Chief Complaint: post cath follow-up  History of Present Illness:    Yulianna Folse is a 75 y.o. female with a hx of  CAD with remote CABG approximately 30 years ago (suspected to be a LIMA-LAD per history), CVA, HLD, and COPD with a long history of tobacco use who is being seen today for the evaluation of hospital follow-up.   Ms. Barcus has a history of remote CABG x 1, at which time she was told "they took a main artery from my chest," and appears to be a LIMA to LAD.   Recently admitted for NSTEMI and is s/p PCI to ostial and mid LAD 10/28/20. Plan for DAPT with Aspirin and Brilinta for 12 months. Echo showed EF 45-50% with HK of basal to mid inferior and inferolateral wall. She was discharged on GDMT.   Today, the cardiac cath and echo were reviewed in detail. She reports compliance with DAPT. No bleeding issues with apsirin or brilinta. Discussed medications with patient. Denies chest pain. Says breathing is worse, worse on exertion. No swelling on her feet. She has chronic orthopnea. Right groin cath site stable with no complications. She was able to walk through the grocery store without significant symptoms, although felt fatigue at the end. EKG shows NSR with suspected residual EKG changes from recent MI in the inferior leads.   Reports slurring of speech. Had some AMS. Still has some slurring of speech. Feels speech is still slurred some. Neuro exam negative.    Past Medical History:  Diagnosis Date   CAD (coronary artery disease)    COPD (chronic obstructive pulmonary disease) (HCC)    CVA (cerebral vascular accident) (HCC)    HLD (hyperlipidemia)     Past Surgical History:  Procedure Laterality Date   CORONARY STENT INTERVENTION N/A  10/28/2020   Procedure: CORONARY STENT INTERVENTION;  Surgeon: Alwyn Pea, MD;  Location: [facility] INVASIVE CV LAB;  Service: Cardiovascular;  Laterality: N/A;   LEFT HEART CATH AND CORONARY ANGIOGRAPHY N/A 10/28/2020   Procedure: LEFT HEART CATH AND CORONARY ANGIOGRAPHY;  Surgeon: Alwyn Pea, MD;  Location: [facility] INVASIVE CV LAB;  Service: Cardiovascular;  Laterality: N/A;    Current Medications: Current Meds  Medication Sig   aspirin 81 MG EC tablet Take 81 mg by mouth daily.   atorvastatin (LIPITOR) 80 MG tablet Take 1 tablet (80 mg total) by mouth daily.   metoprolol succinate (TOPROL XL) 50 MG 24 hr tablet Take 1 tablet (50 mg total) by mouth daily. Take with or immediately following a meal.   sacubitril-valsartan (ENTRESTO) 24-26 MG Take 1 tablet by mouth 2 (two) times daily.   ticagrelor (BRILINTA) 90 MG TABS tablet Take 1 tablet (90 mg total) by mouth 2 (two) times daily.   [DISCONTINUED] losartan (COZAAR) 25 MG tablet Take 0.5 tablets (12.5 mg total) by mouth daily.     Allergies:   Codeine   Social History   Socioeconomic History   Marital status: Single    Spouse name: Not on file   Number of children: Not on file   Years of education: Not on file   Highest education level: Not on file  Occupational History   Not on file  Tobacco Use  Smoking status: Former    Types: Cigarettes   Smokeless tobacco: Never  Substance and Sexual Activity   Alcohol use: Not Currently   Drug use: Not on file   Sexual activity: Not on file  Other Topics Concern   Not on file  Social History Narrative   Not on file   Social Determinants of Health   Financial Resource Strain: Not on file  Food Insecurity: Not on file  Transportation Needs: Not on file  Physical Activity: Not on file  Stress: Not on file  Social Connections: Not on file     Family History: The patient's family history includes Hypertension in her mother.  ROS:   Please see the history of present  illness.     All other systems reviewed and are negative.  EKGs/Labs/Other Studies Reviewed:    The following studies were reviewed today:  Echo 10/2020 1. Left ventricular ejection fraction, by estimation, is 45 to 50%. The  left ventricle has mildly decreased function. The left ventricle  demonstrates regional wall motion abnormalities (see scoring  diagram/findings for description). There is mild  asymmetric left ventricular hypertrophy of the basal-septal segment. Left  ventricular diastolic parameters are consistent with Grade II diastolic  dysfunction (pseudonormalization). Elevated left ventricular end-diastolic  pressure. There is mild  hypokinesis of the left ventricular, basal-mid inferior wall and  inferolateral wall. The average left ventricular global longitudinal  strain is -12.4 %. The global longitudinal strain is abnormal.   2. Right ventricular systolic function is normal. The right ventricular  size is normal. There is mildly elevated pulmonary artery systolic  pressure. The estimated right ventricular systolic pressure is 43.2 mmHg.   3. Left atrial size was severely dilated.   4. The mitral valve is abnormal. Moderate mitral valve regurgitation.  Mild mitral stenosis. Moderate mitral annular calcification.   5. The aortic valve is tricuspid. There is mild calcification of the  aortic valve. There is mild thickening of the aortic valve. Aortic valve  regurgitation is not visualized. Mild aortic valve sclerosis is present,  with no evidence of aortic valve  stenosis.   6. The inferior vena cava is normal in size with greater than 50%  respiratory variability, suggesting right atrial pressure of 3 mmHg.   Cardiac cath 10/2020   Ost LAD lesion is 75% stenosed.   Mid LAD lesion is 99% stenosed.   A drug-eluting stent was successfully placed using a STENT ONYX FRONTIER 2.5X15.   A drug-eluting stent was successfully placed using a STENT ONYX FRONTIER 2.5X30.   A  drug-eluting stent was successfully placed using a STENT ONYX FRONTIER 2.5X30.   Post intervention, there is a 0% residual stenosis.   Post intervention, there is a 0% residual stenosis.   The left ventricular systolic function is normal.   LV end diastolic pressure is normal.   Conclusion Cardiac cath Left main large free of disease Left ventricular function normal a EF of around 55% mild apical hypo- LAD large 75% ostial lesion 99% mid lesion ulcerated TIMI-3 flow Circumflex large left dominant free of disease RCA nondominant   Intervention PCI and stent to mid LAD with DES 2.5 x 30 mm DES PCI and stent to ostial LAD 2.5 x 15 mm DES Continue aspirin Brilinta for at least 12 months Angiomax for 2 hours then discontinue Cardiac care transferred back to [facility] Dr Mariah Milling   EKG:  EKG is  ordered today.  The ekg ordered today demonstrates NSR, 64bpm, TWI inferior leads (  new), TW changes lateral leads possible LVH,   Recent Labs: 10/27/2020: ALT 35 10/29/2020: Hemoglobin 13.1; Magnesium 2.2; Platelets 312 10/30/2020: BUN 13; Creatinine, Ser 0.54; Potassium 3.9; Sodium 135  Recent Lipid Panel    Component Value Date/Time   CHOL 270 (H) 10/27/2020 2158   TRIG 120 10/27/2020 2158   HDL 47 10/27/2020 2158   CHOLHDL 5.7 10/27/2020 2158   VLDL 24 10/27/2020 2158   LDLCALC 199 (H) 10/27/2020 2158     Physical Exam:    VS:  BP 120/80 (BP Location: Left Arm, Patient Position: Sitting, Cuff Size: Normal)   Pulse 64   Ht 5\' 1"  (1.549 m)   Wt 134 lb (60.8 kg)   SpO2 98%   BMI 25.32 kg/m     Wt Readings from Last 3 Encounters:  11/21/20 134 lb (60.8 kg)  10/28/20 134 lb 9.6 oz (61.1 kg)     GEN:  Well nourished, well developed in no acute distress HEENT: Normal NECK: No JVD; No carotid bruits LYMPHATICS: No lymphadenopathy CARDIAC: RRR, no murmurs, rubs, gallops RESPIRATORY:  Clear to auscultation without rales, wheezing or rhonchi  ABDOMEN: Soft, non-tender,  non-distended MUSCULOSKELETAL:  No edema; No deformity  SKIN: Warm and dry NEUROLOGIC:  Alert and oriented x 3 PSYCHIATRIC:  Normal affect   ASSESSMENT:    1. NSTEMI (non-ST elevated myocardial infarction) (HCC)   2. Congestive heart failure, unspecified HF chronicity, unspecified heart failure type (HCC)   3. Ischemic cardiomyopathy   4. Hyperlipidemia, mixed   5. Essential hypertension   6. Coronary artery disease involving native coronary artery of native heart without angina pectoris   7. Chronic obstructive pulmonary disease, unspecified COPD type (HCC)   8. History of CVA (cerebrovascular accident)    PLAN:    In order of problems listed above:  NSTEMI  CAD s/p PCI to ostial and mid LAD 10/28/20 S/p PCI DES to ostial and mid LAD. Right groin cath site is stable. EKG showed NSR with some suspected residual changes from recent MI with TWI inferior leads, can re-check at follow-up. She denies chest pain. Has dypsnea on exertion that is unchanged. She is able to walk 30 minutes without significant symptoms. Referred to cardiac rehab, but patient did not follow-up, this was encouraged so will put in another referral. She denies bleeding with DAPT. Continue Aspirin and Brilinta, will check a CBC today. It's possible DOE from [place], can re-evaluate at follow-up. Continue statin and BB.   ICM Echo during recent hospitalization showed EF 45-50%. Normal LVEDP on cath. She appears euvolemic on exam. Stop Losartan and start Entresto 24-26mg  BID. Continue Toprol. CHF education discussed in detail. BMET in a week. Continue GDMT at follow-up.   HLD LDL 199, goal<70. Continue Lipitor. Re-check lipid profile in 6-8 weeks.   H/o CVA CT head mentioned possible old strokes, no acute abnormalities. Patient and daughter are still concerned with slurred speech, which has mildly improved from hospitalization. Neuro exam negative today. Recommend further follow-up/work-up with PCI. Continue ASA and  Lipitor.   HTN BP good today. Stop Losartan and start Entresto as above. Continue Toprol.   COPD Stable. Patient quit smoking 40+ years ago  Disposition: Follow up  in 2-3 weeks  with MD/APP    Signed, Caedin Mogan [place], PA-C  11/21/2020 3:23 PM    [facility]

## 2020-11-21 ENCOUNTER — Ambulatory Visit (INDEPENDENT_AMBULATORY_CARE_PROVIDER_SITE_OTHER): Payer: Medicare HMO | Admitting: Medical

## 2020-11-21 ENCOUNTER — Encounter: Payer: Self-pay | Admitting: Medical

## 2020-11-21 ENCOUNTER — Telehealth: Payer: Self-pay | Admitting: Medical

## 2020-11-21 ENCOUNTER — Other Ambulatory Visit: Payer: Self-pay

## 2020-11-21 VITALS — BP 120/80 | HR 64 | Ht 61.0 in | Wt 134.0 lb

## 2020-11-21 DIAGNOSIS — I2510 Atherosclerotic heart disease of native coronary artery without angina pectoris: Secondary | ICD-10-CM

## 2020-11-21 DIAGNOSIS — E782 Mixed hyperlipidemia: Secondary | ICD-10-CM

## 2020-11-21 DIAGNOSIS — I214 Non-ST elevation (NSTEMI) myocardial infarction: Secondary | ICD-10-CM

## 2020-11-21 DIAGNOSIS — I255 Ischemic cardiomyopathy: Secondary | ICD-10-CM

## 2020-11-21 DIAGNOSIS — J449 Chronic obstructive pulmonary disease, unspecified: Secondary | ICD-10-CM

## 2020-11-21 DIAGNOSIS — I10 Essential (primary) hypertension: Secondary | ICD-10-CM

## 2020-11-21 DIAGNOSIS — I509 Heart failure, unspecified: Secondary | ICD-10-CM | POA: Diagnosis not present

## 2020-11-21 DIAGNOSIS — Z8673 Personal history of transient ischemic attack (TIA), and cerebral infarction without residual deficits: Secondary | ICD-10-CM

## 2020-11-21 MED ORDER — ENTRESTO 24-26 MG PO TABS: 1.0000 | ORAL_TABLET | Freq: Two times a day (BID) | ORAL | 2 refills | Status: DC

## 2020-11-21 NOTE — Patient Instructions (Signed)
Medication Instructions:  Your physician has recommended you make the following change in your medication:   STOP Losartan.  START Entresto 24/26 mg twice a day. An Rx has been sent to your pharmacy.  *If you need a refill on your cardiac medications before your next appointment, please call your pharmacy*   Lab Work: Cbc today.  Your physician recommends that you return for lab work (bmp) in: 1 week If you have labs (blood work) drawn today and your tests are completely normal, you will receive your results only by: MyChart Message (if you have MyChart) OR A paper copy in the mail If you have any lab test that is abnormal or we need to change your treatment, we will call you to review the results.   Testing/Procedures: None ordered   Follow-Up: At [facility], you and your health needs are our priority.  As part of our continuing mission to provide you with exceptional heart care, we have created designated Provider Care Teams.  These Care Teams include your primary Cardiologist (physician) and Advanced Practice Providers (APPs -  Physician Assistants and Nurse Practitioners) who all work together to provide you with the care you need, when you need it.  We recommend signing up for the patient portal called "MyChart".  Sign up information is provided on this After Visit Summary.  MyChart is used to connect with patients for Virtual Visits (Telemedicine).  Patients are able to view lab/test results, encounter notes, upcoming appointments, etc.  Non-urgent messages can be sent to your provider as well.   To learn more about what you can do with MyChart, go to [email or web address].    Your next appointment:   3 week(s)  The format for your next appointment:   In Person  Provider:   You may see [name], MD or one of the following Advanced Practice Providers on your designated Care Team:   Nicolasa Ducking, NP Eula Listen, PA-C Marisue Ivan, PA-C Cadence Fransico Michael,  [place]   Other Instructions You have been referred to [facility]. They will call you to schedule and enroll.

## 2020-11-21 NOTE — Telephone Encounter (Signed)
Attempted to schedule 2 week fu vm full.

## 2020-11-22 LAB — CBC WITH DIFFERENTIAL/PLATELET
Basophils Absolute: 0.1 10*3/uL (ref 0.0–0.2)
Basos: 1 %
EOS (ABSOLUTE): 0.2 10*3/uL (ref 0.0–0.4)
Eos: 3 %
Hematocrit: 39.4 % (ref 34.0–46.6)
Hemoglobin: 13.1 g/dL (ref 11.1–15.9)
Immature Grans (Abs): 0 10*3/uL (ref 0.0–0.1)
Immature Granulocytes: 0 %
Lymphocytes Absolute: 1.8 10*3/uL (ref 0.7–3.1)
Lymphs: 23 %
MCH: 29.6 pg (ref 26.6–33.0)
MCHC: 33.2 g/dL (ref 31.5–35.7)
MCV: 89 fL (ref 79–97)
Monocytes Absolute: 0.9 10*3/uL (ref 0.1–0.9)
Monocytes: 11 %
Neutrophils Absolute: 5.1 10*3/uL (ref 1.4–7.0)
Neutrophils: 62 %
Platelets: 373 10*3/uL (ref 150–450)
RBC: 4.43 x10E6/uL (ref 3.77–5.28)
RDW: 12.7 % (ref 11.7–15.4)
WBC: 8.1 10*3/uL (ref 3.4–10.8)

## 2020-11-28 ENCOUNTER — Telehealth: Payer: Self-pay | Admitting: Medical

## 2020-11-28 ENCOUNTER — Encounter (HOSPITAL_COMMUNITY): Payer: Self-pay | Admitting: Radiology

## 2020-11-28 ENCOUNTER — Other Ambulatory Visit: Payer: Medicare HMO

## 2020-11-28 ENCOUNTER — Other Ambulatory Visit
Admission: RE | Admit: 2020-11-28 | Discharge: 2020-11-28 | Disposition: A | Discharge status: Home / Self Care | Payer: Medicare HMO | Attending: Medical | Admitting: Medical

## 2020-11-28 DIAGNOSIS — I214 Non-ST elevation (NSTEMI) myocardial infarction: Secondary | ICD-10-CM | POA: Insufficient documentation

## 2020-11-28 LAB — BASIC METABOLIC PANEL
Anion gap: 9 (ref 5–15)
BUN: 16 mg/dL (ref 8–23)
CO2: 24 mmol/L (ref 22–32)
Calcium: 9.1 mg/dL (ref 8.9–10.3)
Chloride: 104 mmol/L (ref 98–111)
Creatinine, Ser: 0.74 mg/dL (ref 0.44–1.00)
GFR, Estimated: >60 mL/min (ref >60)
Glucose, Bld: 113 mg/dL — ABNORMAL HIGH (ref 70–99)
Potassium: 4.2 mmol/L (ref 3.5–5.1)
Sodium: 137 mmol/L (ref 135–145)

## 2020-11-28 NOTE — Telephone Encounter (Signed)
Pt c/o medication issue:  1. Name of Medication: potassium   2. How are you currently taking this medication (dosage and times per day)? Out   3. Are you having a reaction (difficulty breathing--STAT)? no  4. What is your medication issue? Not sure which meds to take if continue or changed to a new one last visit

## 2020-11-28 NOTE — Telephone Encounter (Signed)
Spoke with pt.  Notified pt that Potassium is not an active medication on her med list and this may have only been temporary.  Pt last seen in office 11/21/20 with Cadence Fransico Michael, NP.  Only med changes made at visit was discontinue Losartan and starting Entresto.  Labs completed today and potassium WNL at 4.2.  Notified pt that I do not think she is to be continuing Potassium but I will verify with Cadence.  Pt appreciative and understands not to continue Potassium.  Will call her back if Cadence advises otherwise.

## 2020-12-01 NOTE — Telephone Encounter (Signed)
I spoke with the patient regarding her lab results.  I have also advised her that per Cadence, PA, she does not need a potassium supplement at this time and may discontinue this if she is using it.   The patient voices understanding of all results and recommendations and is agreeable.  She advised she had also not heard from Cardiac Rehab.  I have given her the # to call Cardiac Rehab to follow up on this:  [phone]  The patient was appreciative for the call back.

## 2020-12-01 NOTE — Telephone Encounter (Addendum)
Cadence David Stall, PA-C  12/01/2020  2:35 PM EDT     Labs look good. Continue current medications   Furth, Cadence H, PA-C to [facility] Triage    10:41 AM Agree with stopping daily potassium

## 2020-12-08 NOTE — Progress Notes (Signed)
Cardiology Office Note:    Date:  12/09/2020   ID:  Penny Mccullough, DOB 01-30-1946, MRN [id number]  PCP:  Pcp, No  [facility] Cardiologist:  Dr. [facility] Electrophysiologist:  None   Referring MD: No ref. provider found   Chief Complaint: 3 week follow-up  History of Present Illness:    Penny Mccullough is a 75 y.o. female with a hx of CAD with remote CABG approximately 30 years ago (suspected to be a LIMA-LAD per history), CVA, HLD, and COPD with a long history of tobacco use who is being seen today for the evaluation of hospital follow-up.    Penny Mccullough has a history of remote CABG x 1, at which time she was told "they took a main artery from my chest," and appears to be a LIMA to LAD.    Recently admitted for NSTEMI and is s/p PCI to ostial and mid LAD 10/28/20. Plan for DAPT with Aspirin and Brilinta for 12 months. Echo showed EF 45-50% with HK of basal to mid inferior and inferolateral wall. She was discharged on GDMT.   Last seen 11/21/20 and was overall doing well since discharge.Had some SOB, on Brilinta. Penny Mccullough was started. Reported possible neuro symptoms, recommended PCP follow-up.   Today, the patient reports she feels she is not doing well. Still having trouble making words. Feels short of breath on exertion. Overall energy is better, but some days she feels very tired. Cardiac rehab did not call, phone note recommended she call cardiac rehab. No bleeding issues on DAPT. No chest pain. She has not seen PCP yet, needs to follow-up for possible mammogram for breast lesion, lung nodule, and neurological concerns.  No swelling on there feet, orthopnea, pnd.  Past Medical History:  Diagnosis Date   CAD (coronary artery disease)    COPD (chronic obstructive pulmonary disease) (HCC)    CVA (cerebral vascular accident) (HCC)    HLD (hyperlipidemia)     Past Surgical History:  Procedure Laterality Date   CORONARY STENT INTERVENTION N/A 10/28/2020   Procedure: CORONARY  STENT INTERVENTION;  Surgeon: Alwyn Pea, MD;  Location: [facility] INVASIVE CV LAB;  Service: Cardiovascular;  Laterality: N/A;   LEFT HEART CATH AND CORONARY ANGIOGRAPHY N/A 10/28/2020   Procedure: LEFT HEART CATH AND CORONARY ANGIOGRAPHY;  Surgeon: Alwyn Pea, MD;  Location: [facility] INVASIVE CV LAB;  Service: Cardiovascular;  Laterality: N/A;    Current Medications: Current Meds  Medication Sig   aspirin 81 MG EC tablet Take 81 mg by mouth daily.   atorvastatin (LIPITOR) 80 MG tablet Take 1 tablet (80 mg total) by mouth daily.   empagliflozin (JARDIANCE) 10 MG TABS tablet Take 1 tablet (10 mg total) by mouth daily before breakfast.   metoprolol succinate (TOPROL XL) 50 MG 24 hr tablet Take 1 tablet (50 mg total) by mouth daily. Take with or immediately following a meal.   nitroGLYCERIN (NITROSTAT) 0.4 MG SL tablet Place 1 tablet (0.4 mg total) under the tongue every 5 (five) minutes as needed for chest pain.   sacubitril-valsartan (ENTRESTO) 24-26 MG Take 1 tablet by mouth 2 (two) times daily.   ticagrelor (BRILINTA) 90 MG TABS tablet Take 1 tablet (90 mg total) by mouth 2 (two) times daily.     Allergies:   Codeine   Social History   Socioeconomic History   Marital status: Single    Spouse name: Not on file   Number of children: Not on file   Years of education:  Not on file   Highest education level: Not on file  Occupational History   Not on file  Tobacco Use   Smoking status: Former    Types: Cigarettes   Smokeless tobacco: Never  Substance and Sexual Activity   Alcohol use: Not Currently   Drug use: Not on file   Sexual activity: Not on file  Other Topics Concern   Not on file  Social History Narrative   Not on file   Social Determinants of Health   Financial Resource Strain: Not on file  Food Insecurity: Not on file  Transportation Needs: Not on file  Physical Activity: Not on file  Stress: Not on file  Social Connections: Not on file     Family  History: The patient's family history includes Hypertension in her mother.  ROS:   Please see the history of present illness.     All other systems reviewed and are negative.  EKGs/Labs/Other Studies Reviewed:    The following studies were reviewed today:  Echo 10/2020 1. Left ventricular ejection fraction, by estimation, is 45 to 50%. The  left ventricle has mildly decreased function. The left ventricle  demonstrates regional wall motion abnormalities (see scoring  diagram/findings for description). There is mild  asymmetric left ventricular hypertrophy of the basal-septal segment. Left  ventricular diastolic parameters are consistent with Grade II diastolic  dysfunction (pseudonormalization). Elevated left ventricular end-diastolic  pressure. There is mild  hypokinesis of the left ventricular, basal-mid inferior wall and  inferolateral wall. The average left ventricular global longitudinal  strain is -12.4 %. The global longitudinal strain is abnormal.   2. Right ventricular systolic function is normal. The right ventricular  size is normal. There is mildly elevated pulmonary artery systolic  pressure. The estimated right ventricular systolic pressure is 43.2 mmHg.   3. Left atrial size was severely dilated.   4. The mitral valve is abnormal. Moderate mitral valve regurgitation.  Mild mitral stenosis. Moderate mitral annular calcification.   5. The aortic valve is tricuspid. There is mild calcification of the  aortic valve. There is mild thickening of the aortic valve. Aortic valve  regurgitation is not visualized. Mild aortic valve sclerosis is present,  with no evidence of aortic valve  stenosis.   6. The inferior vena cava is normal in size with greater than 50%  respiratory variability, suggesting right atrial pressure of 3 mmHg.    Cardiac cath 10/2020   Ost LAD lesion is 75% stenosed.   Mid LAD lesion is 99% stenosed.   A drug-eluting stent was successfully placed using  a STENT ONYX FRONTIER 2.5X15.   A drug-eluting stent was successfully placed using a STENT ONYX FRONTIER 2.5X30.   A drug-eluting stent was successfully placed using a STENT ONYX FRONTIER 2.5X30.   Post intervention, there is a 0% residual stenosis.   Post intervention, there is a 0% residual stenosis.   The left ventricular systolic function is normal.   LV end diastolic pressure is normal.   Conclusion Cardiac cath Left main large free of disease Left ventricular function normal a EF of around 55% mild apical hypo- LAD large 75% ostial lesion 99% mid lesion ulcerated TIMI-3 flow Circumflex large left dominant free of disease RCA nondominant   Intervention PCI and stent to mid LAD with DES 2.5 x 30 mm DES PCI and stent to ostial LAD 2.5 x 15 mm DES Continue aspirin Brilinta for at least 12 months Angiomax for 2 hours then discontinue Cardiac care transferred  [facility] to [facility]. Dr Mariah Milling   EKG:  EKG is  ordered today.  The ekg ordered today demonstrates SB 59bpm, old TWI inferolateral leads  Recent Labs: 10/27/2020: ALT 35 10/29/2020: Magnesium 2.2 11/21/2020: Hemoglobin 13.1; Platelets 373 11/28/2020: BUN 16; Creatinine, Ser 0.74; Potassium 4.2; Sodium 137  Recent Lipid Panel    Component Value Date/Time   CHOL 270 (H) 10/27/2020 2158   TRIG 120 10/27/2020 2158   HDL 47 10/27/2020 2158   CHOLHDL 5.7 10/27/2020 2158   VLDL 24 10/27/2020 2158   LDLCALC 199 (H) 10/27/2020 2158     Physical Exam:    VS:  BP 116/60   Pulse (!) 59   Ht 5\' 1"  (1.549 m)   Wt 135 lb 6.4 oz (61.4 kg)   SpO2 95%   BMI 25.58 kg/m     Wt Readings from Last 3 Encounters:  12/09/20 135 lb 6.4 oz (61.4 kg)  11/21/20 134 lb (60.8 kg)  10/28/20 134 lb 9.6 oz (61.1 kg)     GEN:  Well nourished, well developed in no acute distress HEENT: Normal NECK: No JVD; No carotid bruits LYMPHATICS: No lymphadenopathy CARDIAC: RRR, no murmurs, rubs, gallops RESPIRATORY:  Clear to auscultation without rales,  wheezing or rhonchi  ABDOMEN: Soft, non-tender, non-distended MUSCULOSKELETAL:  No edema; No deformity  SKIN: Warm and dry NEUROLOGIC:  Alert and oriented x 3 PSYCHIATRIC:  Normal affect   ASSESSMENT:    1. NSTEMI (non-ST elevated myocardial infarction) (HCC)   2. Congestive heart failure, unspecified HF chronicity, unspecified heart failure type (HCC)   3. Ischemic cardiomyopathy   4. Hyperlipidemia, mixed   5. Coronary artery disease involving native coronary artery of native heart without angina pectoris   6. Chronic systolic heart failure (HCC)   7. Essential hypertension   8. History of CVA (cerebrovascular accident)   9. Chronic obstructive pulmonary disease, unspecified COPD type (HCC)    PLAN:    In order of problems listed above:  NSTEMI CAD s/p PCI to Ostial and mid LAD 10/2020 Patient denies chest pain but reports persistent shortness of breath on exertion. She has not started cardiac rehab, number was provided so she can call. Referral has been placed. Denies bleeding issues with DAPT. Will ask MD if OK to switch to Plavix if DOE is persistent at follow-up. Continue ASA, Brilinta, Lipitor, and statin.   ICM HFmrEF Echo showed LVEF 40-45%, G2DD. She is tolerating Toprol 50mg  daily, Entresto 24-26mg BID well. Euvolemic on exam today. I will add Jardiance 10mg  daily. BMET in a week  HLD LDL 199 with goal<70. Re-check lipids/LFTs in a week. Continue Lipitor  H/o CVA Patient reports trouble finding words. Again, I recommended she see PCP for further work-up.   HTN BP good today. Add Farxiga as above. Continue Entresto and Toprol.  COPD/Lung nodule Quit smoking 35 years ago. Lung nodule seen on CT, recommend annual screening in a year if high risk.  Brest lesion On CT chest, recommend mammogram. Needs to see PCP, this was explained to the patient.   Disposition: Follow up in 1 month(s) with MD/APP     Signed, Kolter Reaver 11/2020, PA-C  12/09/2020 2:55 PM    [facility]

## 2020-12-09 ENCOUNTER — Encounter: Payer: Self-pay | Admitting: Medical

## 2020-12-09 ENCOUNTER — Ambulatory Visit (INDEPENDENT_AMBULATORY_CARE_PROVIDER_SITE_OTHER): Payer: Medicare HMO | Admitting: Medical

## 2020-12-09 ENCOUNTER — Other Ambulatory Visit: Payer: Self-pay

## 2020-12-09 VITALS — BP 116/60 | HR 59 | Ht 61.0 in | Wt 135.4 lb

## 2020-12-09 DIAGNOSIS — I255 Ischemic cardiomyopathy: Secondary | ICD-10-CM | POA: Diagnosis not present

## 2020-12-09 DIAGNOSIS — I2510 Atherosclerotic heart disease of native coronary artery without angina pectoris: Secondary | ICD-10-CM

## 2020-12-09 DIAGNOSIS — E782 Mixed hyperlipidemia: Secondary | ICD-10-CM | POA: Diagnosis not present

## 2020-12-09 DIAGNOSIS — I10 Essential (primary) hypertension: Secondary | ICD-10-CM

## 2020-12-09 DIAGNOSIS — I509 Heart failure, unspecified: Secondary | ICD-10-CM

## 2020-12-09 DIAGNOSIS — I214 Non-ST elevation (NSTEMI) myocardial infarction: Secondary | ICD-10-CM | POA: Diagnosis not present

## 2020-12-09 DIAGNOSIS — Z8673 Personal history of transient ischemic attack (TIA), and cerebral infarction without residual deficits: Secondary | ICD-10-CM

## 2020-12-09 DIAGNOSIS — J449 Chronic obstructive pulmonary disease, unspecified: Secondary | ICD-10-CM

## 2020-12-09 DIAGNOSIS — I5022 Chronic systolic (congestive) heart failure: Secondary | ICD-10-CM

## 2020-12-09 MED ORDER — NITROGLYCERIN 0.4 MG SL SUBL: 0.4000 mg | SUBLINGUAL_TABLET | SUBLINGUAL | 2 refills | Status: DC | PRN

## 2020-12-09 MED ORDER — EMPAGLIFLOZIN 10 MG PO TABS: 10.0000 mg | ORAL_TABLET | Freq: Every day | ORAL | 6 refills | Status: DC

## 2020-12-09 NOTE — Patient Instructions (Addendum)
Medication Instructions:  Your physician has recommended you make the following change in your medication:   START Jardiance 10 mg once daily  If you experience chest pain: Place 1 nitroglycerin under your tongue, while sitting. If no relief of pain, may repeat 1 tablet every 5 minutes up to 3 tablets over 15 minutes. If no relief call 911. If you have dizziness/lightheadedness while taking nitroglycerin, stop taking and call 911.   *If you need a refill on your cardiac medications before your next appointment, please call your pharmacy*   Lab Work: BMET, Lipid and liver blood test here in our office in one week. These will be FASTING labs. No eating or drinking after midnight before except a sip of water with your medications.   If you have labs (blood work) drawn today and your tests are completely normal, you will receive your results only by: MyChart Message (if you have MyChart) OR A paper copy in the mail If you have any lab test that is abnormal or we need to change your treatment, we will call you to review the results.   Testing/Procedures: None   Follow-Up: At [facility], you and your health needs are our priority.  As part of our continuing mission to provide you with exceptional heart care, we have created designated Provider Care Teams.  These Care Teams include your primary Cardiologist (physician) and Advanced Practice Providers (APPs -  Physician Assistants and Nurse Practitioners) who all work together to provide you with the care you need, when you need it.  We recommend signing up for the patient portal called "MyChart".  Sign up information is provided on this After Visit Summary.  MyChart is used to connect with patients for Virtual Visits (Telemedicine).  Patients are able to view lab/test results, encounter notes, upcoming appointments, etc.  Non-urgent messages can be sent to your provider as well.   To learn more about what you can do with MyChart, go to  [email or web address].    Your next appointment:   1 month(s)  The format for your next appointment:   In Person  Provider:   You may see Dr. [name] or one of the following Advanced Practice Providers on your designated Care Team:   Nicolasa Ducking, NP Eula Listen, PA-C Marisue Ivan, PA-C Cadence [place], [place]

## 2020-12-14 ENCOUNTER — Telehealth: Payer: Self-pay | Admitting: Medical

## 2020-12-14 ENCOUNTER — Other Ambulatory Visit: Payer: Medicare HMO

## 2020-12-14 ENCOUNTER — Other Ambulatory Visit
Admission: RE | Admit: 2020-12-14 | Discharge: 2020-12-14 | Disposition: A | Discharge status: Home / Self Care | Payer: Medicare HMO | Attending: Medical | Admitting: Medical

## 2020-12-14 DIAGNOSIS — I214 Non-ST elevation (NSTEMI) myocardial infarction: Secondary | ICD-10-CM

## 2020-12-14 DIAGNOSIS — I255 Ischemic cardiomyopathy: Secondary | ICD-10-CM

## 2020-12-14 DIAGNOSIS — E782 Mixed hyperlipidemia: Secondary | ICD-10-CM

## 2020-12-14 DIAGNOSIS — I2510 Atherosclerotic heart disease of native coronary artery without angina pectoris: Secondary | ICD-10-CM

## 2020-12-14 DIAGNOSIS — I509 Heart failure, unspecified: Secondary | ICD-10-CM | POA: Diagnosis present

## 2020-12-14 LAB — BASIC METABOLIC PANEL WITH GFR
Anion gap: 7 (ref 5–15)
BUN: 13 mg/dL (ref 8–23)
CO2: 26 mmol/L (ref 22–32)
Calcium: 8.9 mg/dL (ref 8.9–10.3)
Chloride: 104 mmol/L (ref 98–111)
Creatinine, Ser: 0.85 mg/dL (ref 0.44–1.00)
GFR, Estimated: >60 mL/min
Glucose, Bld: 121 mg/dL — ABNORMAL HIGH (ref 70–99)
Potassium: 4.2 mmol/L (ref 3.5–5.1)
Sodium: 137 mmol/L (ref 135–145)

## 2020-12-14 LAB — LIPID PANEL
Cholesterol: 144 mg/dL (ref 0–200)
HDL: 43 mg/dL
Total CHOL/HDL Ratio: 3.3 ratio
Triglycerides: 126 mg/dL
VLDL: 25 mg/dL (ref 0–40)

## 2020-12-14 LAB — HEPATIC FUNCTION PANEL
ALT: 16 U/L (ref 0–44)
AST: 16 U/L (ref 15–41)
Albumin: 3.7 g/dL (ref 3.5–5.0)
Alkaline Phosphatase: 139 U/L — ABNORMAL HIGH (ref 38–126)
Bilirubin, Direct: <0.1 mg/dL (ref 0.0–0.2)
Total Bilirubin: 1.4 mg/dL — ABNORMAL HIGH (ref 0.3–1.2)
Total Protein: 6.9 g/dL (ref 6.5–8.1)

## 2020-12-14 NOTE — Telephone Encounter (Signed)
I called and spoke with the lab at [facility] to inquire why the LDL did not calculate on the patient's report today. Per the [facility] lab tech, if the LDL below a certain # it will not calculate. I inquired if a Direct LDL would reflect a more accurate result and I was advised they were unsure of this since they do not draw direct LDL's there.  I have reviewed the above with Cadence Fransico Michael, [place]. Ok to draw a direct LDL when seen in follow up next month.  I have called and spoken with the patient. I have advised her of her lab results and Cadence's recommendations as above regarding her LDL.  The patient voices understanding and is agreeable.

## 2020-12-14 NOTE — Telephone Encounter (Signed)
Cadence David Stall, PA-C  12/14/2020 12:54 PM EDT     Lasb look good overall. For some reason it did not calculate LDL, lets have direct LDL lab drawn.

## 2021-01-05 ENCOUNTER — Other Ambulatory Visit: Payer: Self-pay

## 2021-01-05 ENCOUNTER — Encounter: Payer: Medicare HMO | Attending: Cardiovascular Disease

## 2021-01-05 DIAGNOSIS — Z955 Presence of coronary angioplasty implant and graft: Secondary | ICD-10-CM

## 2021-01-05 DIAGNOSIS — I214 Non-ST elevation (NSTEMI) myocardial infarction: Secondary | ICD-10-CM

## 2021-01-05 NOTE — Progress Notes (Signed)
Virtual Visit completed. Patient informed on EP and RD appointment and 6 Minute walk test. Patient also informed of patient health questionnaires on My Chart. Patient Verbalizes understanding. Visit diagnosis can be found in [facility] 10/27/2020. 

## 2021-01-09 ENCOUNTER — Ambulatory Visit (INDEPENDENT_AMBULATORY_CARE_PROVIDER_SITE_OTHER): Payer: Medicare HMO | Admitting: Cardiovascular Disease

## 2021-01-09 ENCOUNTER — Encounter: Payer: Self-pay | Admitting: Cardiovascular Disease

## 2021-01-09 ENCOUNTER — Other Ambulatory Visit: Payer: Self-pay

## 2021-01-09 DIAGNOSIS — I420 Dilated cardiomyopathy: Secondary | ICD-10-CM | POA: Diagnosis not present

## 2021-01-09 DIAGNOSIS — I25118 Atherosclerotic heart disease of native coronary artery with other forms of angina pectoris: Secondary | ICD-10-CM | POA: Diagnosis not present

## 2021-01-09 DIAGNOSIS — I509 Heart failure, unspecified: Secondary | ICD-10-CM

## 2021-01-09 DIAGNOSIS — I2510 Atherosclerotic heart disease of native coronary artery without angina pectoris: Secondary | ICD-10-CM | POA: Insufficient documentation

## 2021-01-09 DIAGNOSIS — J432 Centrilobular emphysema: Secondary | ICD-10-CM

## 2021-01-09 DIAGNOSIS — J449 Chronic obstructive pulmonary disease, unspecified: Secondary | ICD-10-CM | POA: Insufficient documentation

## 2021-01-09 MED ORDER — ALBUTEROL SULFATE HFA 108 (90 BASE) MCG/ACT IN AERS: 2.0000 | INHALATION_SPRAY | Freq: Four times a day (QID) | RESPIRATORY_TRACT | 0 refills | Status: AC | PRN

## 2021-01-09 MED ORDER — METOPROLOL SUCCINATE ER 50 MG PO TB24: 50.0000 mg | ORAL_TABLET | Freq: Every day | ORAL | 3 refills | Status: DC

## 2021-01-09 MED ORDER — PREDNISONE 20 MG PO TABS: ORAL_TABLET | ORAL | 0 refills | Status: DC

## 2021-01-09 MED ORDER — FUROSEMIDE 20 MG PO TABS: 20.0000 mg | ORAL_TABLET | Freq: Every day | ORAL | 3 refills | Status: DC | PRN

## 2021-01-09 MED ORDER — ENTRESTO 24-26 MG PO TABS
1.0000 | ORAL_TABLET | Freq: Two times a day (BID) | ORAL | 3 refills | Status: DC
Start: 2021-01-09 — End: 2021-02-06

## 2021-01-09 MED ORDER — POTASSIUM CHLORIDE ER 10 MEQ PO TBCR: 10.0000 meq | EXTENDED_RELEASE_TABLET | Freq: Every day | ORAL | 3 refills | Status: DC | PRN

## 2021-01-09 MED ORDER — TICAGRELOR 90 MG PO TABS: 90.0000 mg | ORAL_TABLET | Freq: Two times a day (BID) | ORAL | 3 refills | Status: DC

## 2021-01-09 MED ORDER — ATORVASTATIN CALCIUM 80 MG PO TABS: 80.0000 mg | ORAL_TABLET | Freq: Every day | ORAL | 3 refills | Status: DC

## 2021-01-09 NOTE — Patient Instructions (Addendum)
[facility] [phone] Assistance with finding a primary care provider within your area  Medication Instructions:  Please START Prednisone dose pack 40 mg (3 days) Then 20 mg (3 days) Then 10 mg (3 days)  Albuterol inhaler  2 puffs as needed up to every 6 hours  Lasix/furosemide  20 mg daily as needed For shortness of breath or leg swelling Potassium  10 meq daily as needed Take with lasix  Please STOP jardiance for now  If you need a refill on your cardiac medications before your next appointment, please call your pharmacy.    Lab work: No new labs needed  Testing/Procedures: No new testing needed  Follow-Up: At [facility], you and your health needs are our priority.  As part of our continuing mission to provide you with exceptional heart care, we have created designated Provider Care Teams.  These Care Teams include your primary Cardiologist (physician) and Advanced Practice Providers (APPs -  Physician Assistants and Nurse Practitioners) who all work together to provide you with the care you need, when you need it.  You will need a follow up appointment in 1 month, APP ok  Providers on your designated Care Team:   Nicolasa Ducking, NP Eula Listen, PA-C Cadence Fransico Michael, [place]  COVID-19 Vaccine Information can be found at: [email or web address] For questions related to vaccine distribution or appointments, please email [email or web address] or call [phone].

## 2021-01-09 NOTE — Progress Notes (Addendum)
Cardiology Office Note  Date:  01/09/2021   ID:  Penny Mccullough, DOB [birth year], MRN [id number]  PCP:  Pcp, No   Chief Complaint  Patient presents with   1 month follow up     Patient c/o shortness of breath with little to no exertion. Medications reviewed by the patient verbally.     HPI:  Penny Mccullough is a 75 y.o. female with a hx of  CAD with remote CABG approximately 30 years ago (suspected to be a LIMA-LAD per history),  CVA,  HLD,  COPD /tobacco  Ejection fraction 45 to 50% Moderate MR, severely dilated left atrium Who presents for follow-up of her coronary disease  September 2022 NSTEMI s/p PCI to ostial and mid LAD 10/28/20.   Echo showed EF 45-50% with HK of basal to mid inferior and inferolateral wall.   Has been seen in our clinic in follow-up, losartan was changed to [facility] [facility] On most recent visit started on Jardiance Reports that she does not feel well, general malaise, shortness of breath, no energy, attributes this to her medications Reports that she was feeling better after discharge from the hospital, now does not feel as well  Getting over bronchitis, some wheezing Does not have primary care, No inhaler Has not started cardiac rehabilitation yet  EKG personally reviewed by myself on todays visit Normal sinus rhythm rate 61 bpm PVCs  Other past medical history reviewed remote CABG x 1, at which time she was told "they took a main artery from my chest," and appears to be a LIMA to LAD.     PMH:   has a past medical history of CAD (coronary artery disease), COPD (chronic obstructive pulmonary disease) (HCC), CVA (cerebral vascular accident) (HCC), and HLD (hyperlipidemia).  PSH:    Past Surgical History:  Procedure Laterality Date   CORONARY STENT INTERVENTION N/A 10/28/2020   Procedure: CORONARY STENT INTERVENTION;  Surgeon: Alwyn Pea, MD;  Location: [facility] INVASIVE CV LAB;  Service: Cardiovascular;  Laterality: N/A;   LEFT HEART CATH AND CORONARY  ANGIOGRAPHY N/A 10/28/2020   Procedure: LEFT HEART CATH AND CORONARY ANGIOGRAPHY;  Surgeon: Alwyn Pea, MD;  Location: [facility] INVASIVE CV LAB;  Service: Cardiovascular;  Laterality: N/A;    Current Outpatient Medications  Medication Sig Dispense Refill   albuterol (VENTOLIN HFA) 108 (90 Base) MCG/ACT inhaler Inhale 2 puffs into the lungs every 6 (six) hours as needed for wheezing or shortness of breath. 8 g 0   aspirin 81 MG EC tablet Take 81 mg by mouth daily.     furosemide (LASIX) 20 MG tablet Take 1 tablet (20 mg total) by mouth daily as needed. For shortness of breath or leg swelling 90 tablet 3   nitroGLYCERIN (NITROSTAT) 0.4 MG SL tablet Place 1 tablet (0.4 mg total) under the tongue every 5 (five) minutes as needed for chest pain. 25 tablet 2   potassium chloride (KLOR-CON) 10 MEQ tablet Take 1 tablet (10 mEq total) by mouth daily as needed. Take when taking Lasix 90 tablet 3   predniSONE (DELTASONE) 20 MG tablet Please take 40 mg for 3 days, Then 20 mg for 3 days, Then 10 mg for 3 days 12 tablet 0   atorvastatin (LIPITOR) 80 MG tablet Take 1 tablet (80 mg total) by mouth daily. 90 tablet 3   metoprolol succinate (TOPROL XL) 50 MG 24 hr tablet Take 1 tablet (50 mg total) by mouth daily. 90 tablet 3   sacubitril-valsartan (ENTRESTO) 24-26 MG Take 1  tablet by mouth 2 (two) times daily. 180 tablet 3   ticagrelor (BRILINTA) 90 MG TABS tablet Take 1 tablet (90 mg total) by mouth 2 (two) times daily. 180 tablet 3   No current facility-administered medications for this visit.     Allergies:   Codeine   Social History:  The patient  reports that she quit smoking about 33 years ago. Her smoking use included cigarettes. She has a 30.00 pack-year smoking history. She has never used smokeless tobacco. She reports that she does not currently use alcohol.   Family History:   family history includes Hypertension in her mother.    Review of Systems: Review of Systems  Constitutional:   Positive for malaise/fatigue.  HENT: Negative.    Respiratory:  Positive for shortness of breath and wheezing.   Cardiovascular: Negative.   Gastrointestinal: Negative.   Musculoskeletal: Negative.   Neurological: Negative.   Psychiatric/Behavioral: Negative.    All other systems reviewed and are negative.   PHYSICAL EXAM: VS:  BP 140/80 (BP Location: Left Arm, Patient Position: Sitting, Cuff Size: Normal)   Pulse 61   Ht 5' (1.524 m)   Wt 134 lb 8 oz (61 kg)   SpO2 98%   BMI 26.27 kg/m  , BMI Body mass index is 26.27 kg/m. GEN: Well nourished, well developed, in no acute distress HEENT: normal Neck: no JVD, carotid bruits, or masses Cardiac: Irregularly irregular, rapid,; no murmurs, rubs, or gallops,no edema  Respiratory:  clear to auscultation bilaterally, normal work of breathing GI: soft, nontender, nondistended, + BS MS: no deformity or atrophy Skin: warm and dry, no rash Neuro:  Strength and sensation are intact Psych: euthymic mood, full affect   Recent Labs: 10/29/2020: Magnesium 2.2 11/21/2020: Hemoglobin 13.1; Platelets 373 12/14/2020: ALT 16; BUN 13; Creatinine, Ser 0.85; Potassium 4.2; Sodium 137    Lipid Panel Lab Results  Component Value Date   CHOL 144 12/14/2020   HDL 43 12/14/2020   LDLCALC NOT CALCULATED 12/14/2020   TRIG 126 12/14/2020      Wt Readings from Last 3 Encounters:  01/09/21 134 lb 8 oz (61 kg)  12/09/20 135 lb 6.4 oz (61.4 kg)  11/21/20 134 lb (60.8 kg)       ASSESSMENT AND PLAN:  Problem List Items Addressed This Visit       Cardiology Problems   CAD (coronary artery disease)   Relevant Medications   furosemide (LASIX) 20 MG tablet   atorvastatin (LIPITOR) 80 MG tablet   metoprolol succinate (TOPROL XL) 50 MG 24 hr tablet   sacubitril-valsartan (ENTRESTO) 24-26 MG   Dilated cardiomyopathy (HCC)   Relevant Medications   furosemide (LASIX) 20 MG tablet   atorvastatin (LIPITOR) 80 MG tablet   metoprolol succinate  (TOPROL XL) 50 MG 24 hr tablet   sacubitril-valsartan (ENTRESTO) 24-26 MG     Other   COPD (chronic obstructive pulmonary disease) (HCC)   Relevant Medications   predniSONE (DELTASONE) 20 MG tablet   albuterol (VENTOLIN HFA) 108 (90 Base) MCG/ACT inhaler   Other Visit Diagnoses     Congestive heart failure, unspecified HF chronicity, unspecified heart failure type (HCC)       Relevant Medications   furosemide (LASIX) 20 MG tablet   atorvastatin (LIPITOR) 80 MG tablet   metoprolol succinate (TOPROL XL) 50 MG 24 hr tablet   sacubitril-valsartan (ENTRESTO) 24-26 MG   Other Relevant Orders   EKG 12-Lead      Coronary disease with stable angina Recent PCI  to ostial and mid LAD Scheduled to start cardiac rehab Unable to exclude mild depression following event in September Medication changes as below  Malaise, fatigue She attributes some of this to her medications, may be worse with Jardiance she reports For now we will hold Jardiance for several weeks, and If no improvement may need to retry losartan versus Entresto Reports feeling better for several weeks following discharge before medication changes were made  Shortness of breath/wheezing Recent viral URI, mild wheezing today, cough getting better Likely contributing to her shortness of breath Prednisone taper if symptoms get worse We have prescribed albuterol inhaler Needs to establish with primary care    Total encounter time more than 35 minutes  Greater than 50% was spent in counseling and coordination of care with the patient    Signed, Dossie Arbour, M.D., Ph.D. [facility] [place], [place] [phone]

## 2021-01-22 ENCOUNTER — Emergency Department
Admission: EM | Admit: 2021-01-22 | Discharge: 2021-01-22 | Disposition: A | Discharge status: Home / Self Care | Payer: Medicare HMO | Attending: Emergency Medicine | Admitting: Emergency Medicine

## 2021-01-22 ENCOUNTER — Emergency Department: Payer: Medicare HMO

## 2021-01-22 ENCOUNTER — Encounter: Payer: Self-pay | Admitting: Emergency Medicine

## 2021-01-22 ENCOUNTER — Other Ambulatory Visit: Payer: Self-pay

## 2021-01-22 DIAGNOSIS — E876 Hypokalemia: Secondary | ICD-10-CM | POA: Diagnosis not present

## 2021-01-22 DIAGNOSIS — R0602 Shortness of breath: Secondary | ICD-10-CM | POA: Diagnosis present

## 2021-01-22 DIAGNOSIS — I2510 Atherosclerotic heart disease of native coronary artery without angina pectoris: Secondary | ICD-10-CM | POA: Insufficient documentation

## 2021-01-22 DIAGNOSIS — I509 Heart failure, unspecified: Secondary | ICD-10-CM | POA: Diagnosis not present

## 2021-01-22 DIAGNOSIS — J449 Chronic obstructive pulmonary disease, unspecified: Secondary | ICD-10-CM | POA: Insufficient documentation

## 2021-01-22 DIAGNOSIS — Z7952 Long term (current) use of systemic steroids: Secondary | ICD-10-CM | POA: Diagnosis not present

## 2021-01-22 DIAGNOSIS — Z87891 Personal history of nicotine dependence: Secondary | ICD-10-CM | POA: Insufficient documentation

## 2021-01-22 DIAGNOSIS — Z20822 Contact with and (suspected) exposure to covid-19: Secondary | ICD-10-CM | POA: Insufficient documentation

## 2021-01-22 LAB — BASIC METABOLIC PANEL
Anion gap: 10 (ref 5–15)
BUN: 13 mg/dL (ref 8–23)
CO2: 26 mmol/L (ref 22–32)
Calcium: 9 mg/dL (ref 8.9–10.3)
Chloride: 102 mmol/L (ref 98–111)
Creatinine, Ser: 0.69 mg/dL (ref 0.44–1.00)
GFR, Estimated: >60 mL/min (ref >60)
Glucose, Bld: 162 mg/dL — ABNORMAL HIGH (ref 70–99)
Potassium: 3.3 mmol/L — ABNORMAL LOW (ref 3.5–5.1)
Sodium: 138 mmol/L (ref 135–145)

## 2021-01-22 LAB — CBC
HCT: 40.7 % (ref 36.0–46.0)
Hemoglobin: 13.2 g/dL (ref 12.0–15.0)
MCH: 29.3 pg (ref 26.0–34.0)
MCHC: 32.4 g/dL (ref 30.0–36.0)
MCV: 90.4 fL (ref 80.0–100.0)
Platelets: 444 10*3/uL — ABNORMAL HIGH (ref 150–400)
RBC: 4.5 MIL/uL (ref 3.87–5.11)
RDW: 14.1 % (ref 11.5–15.5)
WBC: 11.7 10*3/uL — ABNORMAL HIGH (ref 4.0–10.5)
nRBC: 0 % (ref 0.0–0.2)

## 2021-01-22 LAB — RESP PANEL BY RT-PCR (FLU A&B, COVID) ARPGX2
Influenza A by PCR: NEGATIVE
Influenza B by PCR: NEGATIVE
SARS Coronavirus 2 by RT PCR: NEGATIVE

## 2021-01-22 LAB — BRAIN NATRIURETIC PEPTIDE: B Natriuretic Peptide: 1905.6 pg/mL — ABNORMAL HIGH (ref 0.0–100.0)

## 2021-01-22 LAB — TROPONIN I (HIGH SENSITIVITY): Troponin I (High Sensitivity): 15 ng/L (ref <18)

## 2021-01-22 LAB — MAGNESIUM: Magnesium: 2.2 mg/dL (ref 1.7–2.4)

## 2021-01-22 LAB — D-DIMER, QUANTITATIVE: D-Dimer, Quant: 0.52 ug/mL-FEU — ABNORMAL HIGH (ref 0.00–0.50)

## 2021-01-22 LAB — PROTIME-INR
INR: 0.9 (ref 0.8–1.2)
Prothrombin Time: 11.8 seconds (ref 11.4–15.2)

## 2021-01-22 LAB — PROCALCITONIN: Procalcitonin: <0.1 ng/mL

## 2021-01-22 MED ORDER — FUROSEMIDE 10 MG/ML IJ SOLN
20.0000 mg | Freq: Once | INTRAMUSCULAR | Status: AC
  Administered 2021-01-22: 17:00:00 20 mg via INTRAVENOUS
  Filled 2021-01-22: qty 4

## 2021-01-22 MED ORDER — POTASSIUM CHLORIDE CRYS ER 20 MEQ PO TBCR
40.0000 meq | EXTENDED_RELEASE_TABLET | Freq: Once | ORAL | Status: AC
  Administered 2021-01-22: 16:00:00 40 meq via ORAL
  Filled 2021-01-22: qty 2

## 2021-01-22 MED ORDER — POTASSIUM CHLORIDE CRYS ER 20 MEQ PO TBCR
40.0000 meq | EXTENDED_RELEASE_TABLET | Freq: Once | ORAL | Status: AC
  Administered 2021-01-22: 17:00:00 40 meq via ORAL
  Filled 2021-01-22: qty 2

## 2021-01-22 MED ORDER — FUROSEMIDE 20 MG PO TABS: 40.0000 mg | ORAL_TABLET | Freq: Two times a day (BID) | ORAL | 0 refills | Status: DC

## 2021-01-22 NOTE — ED Triage Notes (Signed)
Pt comes into the ED via POV c/o [facility] that has been ongoing for a couple months.  Pt states that she was admitted back in September for an MI where they placed 2 stents.  Pt was discharged with minimal [facility] but it has gotten worse.  Pt does take fluid pills but denies any known knowledge of CHF.  Pt admits she does notice some swelling in the lower legs and ankle at the end of the day.  Pt currently has even and unlabored respirations at this time.

## 2021-01-22 NOTE — ED Provider Notes (Signed)
[facility] Emergency Department Provider Note  ____________________________________________   Event Date/Time   First MD Initiated Contact with Patient 01/22/21 1454     (approximate)  I have reviewed the triage vital signs and the nursing notes.   HISTORY  Chief Complaint Shortness of Breath   HPI Penny Mccullough is a 75 y.o. female with a past medical history of CAD with remote CABG and recent admission for NSTEMI s/p PCI to ostial and mid LAD 10/28/20 as well as history of CHF, COPD, CVA, HDL and remote tobacco abuse who presents for assessment of ongoing shortness of breath since she was last discharged.  Patient states her shortness of breath has not gotten any better but has also not gotten much worse but she feels she can take a few steps before becoming short of breath.  Her shortness of breath today is not any different than it was last week or the week before.  She denies any new cough, fevers, chills, headache, earache, sore throat, abdominal pain, back pain, chest pain, nausea, vomiting, diarrhea, rash, burning with urination, leg swelling, weight gain or other acute complaints.  She is frustrated that she feels she is not getting better even after some medication changes were made at her last cardiology appointment on 11/21.         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   Hyperglycemia 10/27/2020   Breast lesion 10/27/2020   Pulmonary nodule 10/27/2020     Past Surgical History:  Procedure Laterality Date   CORONARY STENT INTERVENTION N/A 10/28/2020   Procedure: CORONARY STENT INTERVENTION;  Surgeon: Alwyn Pea, MD;  Location: [facility] INVASIVE CV LAB;  Service: Cardiovascular;  Laterality: N/A;   LEFT HEART CATH AND CORONARY ANGIOGRAPHY N/A 10/28/2020   Procedure: LEFT HEART CATH AND CORONARY ANGIOGRAPHY;  Surgeon: Alwyn Pea, MD;  Location: [facility] INVASIVE CV LAB;  Service: Cardiovascular;  Laterality: N/A;    Prior to Admission medications   Medication Sig Start Date End Date Taking? Authorizing Provider  albuterol (VENTOLIN HFA) 108 (90 Base) MCG/ACT inhaler Inhale 2 puffs into the lungs every 6 (six) hours as needed for wheezing or shortness of breath. 01/09/21   Antonieta Iba, MD  aspirin 81 MG EC tablet Take 81 mg by mouth daily.    [provider]  atorvastatin (LIPITOR) 80 MG tablet Take 1 tablet (80 mg total) by mouth daily. 01/09/21   Antonieta Iba, MD  furosemide (LASIX) 20 MG tablet Take 2 tablets (40 mg total) by mouth 2 (two) times daily for 5 days. For shortness of breath or leg swelling 01/22/21 01/27/21  Gilles Chiquito, MD  metoprolol succinate (TOPROL XL) 50 MG 24 hr tablet Take 1 tablet (50 mg total) by mouth daily. 01/09/21   Antonieta Iba, MD  nitroGLYCERIN (NITROSTAT) 0.4 MG SL tablet Place 1 tablet (0.4 mg total) under the tongue every 5 (five) minutes as needed for chest pain. 12/09/20   Furth, Cadence H, PA-C  potassium  chloride (KLOR-CON) 10 MEQ tablet Take 1 tablet (10 mEq total) by mouth daily as needed. Take when taking Lasix 01/09/21   Minna Merritts, MD  predniSONE (DELTASONE) 20 MG tablet Please take 40 mg for 3 days, Then 20 mg for 3 days, Then 10 mg for 3 days 01/09/21   Minna Merritts, MD  sacubitril-valsartan (ENTRESTO) 24-26 MG Take 1 tablet by mouth 2 (two) times daily. 01/09/21   Minna Merritts, MD  ticagrelor (BRILINTA) 90 MG TABS tablet Take 1 tablet (90 mg total) by  mouth 2 (two) times daily. 01/09/21   Minna Merritts, MD    Allergies Codeine  Family History  Problem Relation Age of Onset   Hypertension Mother     Social History Social History   Tobacco Use   Smoking status: Former    Packs/day: 1.00    Years: 30.00    Pack years: 30.00    Types: Cigarettes    Quit date: 02/21/1987    Years since quitting: 33.9   Smokeless tobacco: Never  Vaping Use   Vaping Use: Never used  Substance Use Topics   Alcohol use: Not Currently    Review of Systems  Review of Systems  Constitutional:  Negative for chills and fever.  HENT:  Negative for sore throat.   Eyes:  Negative for pain.  Respiratory:  Positive for shortness of breath. Negative for cough and stridor.   Cardiovascular:  Negative for chest pain.  Gastrointestinal:  Negative for vomiting.  Genitourinary:  Negative for dysuria.  Musculoskeletal:  Negative for myalgias.  Skin:  Negative for rash.  Neurological:  Negative for seizures, loss of consciousness and headaches.  Psychiatric/Behavioral:  Negative for suicidal ideas.   All other systems reviewed and are negative.    ____________________________________________   PHYSICAL EXAM:  VITAL SIGNS: ED Triage Vitals  Enc Vitals Group     BP 01/22/21 1408 (!) 142/86     Pulse Rate 01/22/21 1408 73     Resp 01/22/21 1408 17     Temp 01/22/21 1408 97.8 F (36.6 C)     Temp Source 01/22/21 1408 Oral     SpO2 01/22/21 1408 97 %     Weight 01/22/21 1404 134 lb 8 oz (61 kg)     Height 01/22/21 1404 5' (1.524 m)     Head Circumference --      Peak Flow --      Pain Score 01/22/21 1404 0     Pain Loc --      Pain Edu? --      Excl. in [place]? --    Vitals:   01/22/21 1500 01/22/21 1530  BP: 127/63 124/62  Pulse: 68 67  Resp: 20   Temp:    SpO2: 95% 92%   Physical Exam Vitals and nursing note reviewed.  Constitutional:      General: She is not in acute distress.    Appearance: She is well-developed.  HENT:      Head: Normocephalic and atraumatic.  Eyes:     Conjunctiva/sclera: Conjunctivae normal.  Cardiovascular:     Rate and Rhythm: Normal rate and regular rhythm.     Heart sounds: No murmur heard. Pulmonary:     Effort: Pulmonary effort is normal. No respiratory distress.     Breath sounds: Normal breath sounds.  Abdominal:     Palpations: Abdomen is soft.     Tenderness: There is no abdominal tenderness.  Musculoskeletal:  General: No swelling.     Cervical back: Neck supple.  Skin:    General: Skin is warm and dry.     Capillary Refill: Capillary refill takes less than 2 seconds.  Neurological:     Mental Status: She is alert.  Psychiatric:        Mood and Affect: Mood normal.     ____________________________________________   LABS (all labs ordered are listed, but only abnormal results are displayed)  Labs Reviewed  BASIC METABOLIC PANEL - Abnormal; Notable for the following components:      Result Value   Potassium 3.3 (*)    Glucose, Bld 162 (*)    All other components within normal limits  CBC - Abnormal; Notable for the following components:   WBC 11.7 (*)    Platelets 444 (*)    All other components within normal limits  BRAIN NATRIURETIC PEPTIDE - Abnormal; Notable for the following components:   B Natriuretic Peptide 1,905.6 (*)    All other components within normal limits  D-DIMER, QUANTITATIVE (NOT AT [facility]) - Abnormal; Notable for the following components:   D-Dimer, Quant 0.52 (*)    All other components within normal limits  RESP PANEL BY RT-PCR (FLU A&B, COVID) ARPGX2  PROTIME-INR  PROCALCITONIN  MAGNESIUM  TROPONIN I (HIGH SENSITIVITY)   ____________________________________________  EKG  ECG shows sinus rhythm with PVCs in a pattern of bigeminy with a ventricular rate of 72, normal axis, QTC at 528 with some nonspecific ST changes in anterior leads without other clearance of acute ischemia or significant  arrhythmia. ____________________________________________  RADIOLOGY  ED MD interpretation: Chest x-ray shows some bilateral patchiness in the lower lobes concerning for some atelectasis and small bilateral pleural effusions.  Without evidence of pneumothorax or other clear acute process.  Official radiology report(s): DG Chest 2 View  Result Date: 01/22/2021 CLINICAL DATA:  Worsening shortness of breath. EXAM: CHEST - 2 VIEW COMPARISON:  October 27, 2020. FINDINGS: Prior median sternotomy. The heart size and mediastinal contours are within normal limits. Small bilateral pleural effusions with adjacent airspace opacities which may reflect atelectasis versus infiltrate. Chronic bronchitic lung changes. Thoracic spondylosis. IMPRESSION: Small bilateral pleural effusions with adjacent airspace opacities which may reflect atelectasis versus infiltrate. Electronically Signed   By: Dahlia Bailiff M.D.   On: 01/22/2021 15:26    ____________________________________________   PROCEDURES  Procedure(s) performed (including Critical Care):  .1-3 Lead EKG Interpretation Performed by: Lucrezia Starch, MD Authorized by: Lucrezia Starch, MD     Interpretation: normal     ECG rate assessment: normal     Rhythm: sinus rhythm     Ectopy: PVCs     Conduction: abnormal     ____________________________________________   INITIAL IMPRESSION / ASSESSMENT AND PLAN / ED COURSE      Patient presents with above-stated history exam for assessment of some subacute shortness of breath after recently being mated for NSTEMI in the setting of known COPD and and CHF as well as remote tobacco abuse.  On arrival to emergency room patient is afebrile hemodynamically stable.  She has no significant wheezing or shortness of breath or abnormal lung sounds on exam.  Differential considerations include symptoms related to heart failure, COPD, anemia, bowel derangements, arrhythmia, and bronchitis and a PE.  Chest  x-ray shows some bilateral patchiness in the lower lobes concerning for some atelectasis and small bilateral pleural effusions.  Without evidence of pneumothorax or other clear acute process.  ECG shows sinus rhythm  with PVCs in a pattern of bigeminy with a ventricular rate of 72, normal axis, QTC at 528 with some nonspecific ST changes in anterior leads without other clearance of acute ischemia or significant arrhythmia.  Given troponin of 15 obtained greater than 3 hours after symptom onset I have a low suspicion for ACS or myocarditis.  Procalcitonin is undetectable.  Magnesium is within normal limits.  CBC shows slight leukocytosis with WBC count of 11.7 with normal hemoglobin and platelets.  BMP remarkable for K of 3.3 and glucose of 152 without other significant ocular or metabolic derangements.  BNP is elevated at 1905.  D-dimer of 0.52 is within age-adjusted limits and thus have a low suspicion for PE at this time.  COVID influenza PCR is negative.  Will patient does not appear significantly volume overloaded on exam given some effusions on x-ray.  BNP of 1900 I am concerned for component of volume overload contributing to symptoms.  We will give a dose of 20 mg of IV Lasix here and increased dose of home Lasix to 40 mg twice daily from 20 mg.  She will follow-up with cardiology as soon as able to get appointment and also place referral for cardiology clinic to see if we can get her in tomorrow if this is sooner than cardiology can.  Discussed importance of continuing her potassium repletion and we will replete this while she is emergency room.  Low suspicion for any life-threatening process at this time.  Discharged in stable condition. ____________________________________________   FINAL CLINICAL IMPRESSION(S) / ED DIAGNOSES  Final diagnoses:  Congestive heart failure, unspecified HF chronicity, unspecified heart failure type (HCC)  Hypokalemia    Medications  furosemide (LASIX) injection  20 mg (has no administration in time range)  potassium chloride SA (KLOR-CON M) CR tablet 40 mEq (has no administration in time range)  potassium chloride SA (KLOR-CON M) CR tablet 40 mEq (40 mEq Oral Given 01/22/21 1541)     ED Discharge Orders          Ordered    furosemide (LASIX) 20 MG tablet  2 times daily        01/22/21 1643    AMB referral to CHF clinic        01/22/21 1643             Note:  This document was prepared using Dragon voice recognition software and may include unintentional dictation errors.    Gilles Chiquito, MD 01/22/21 [phone]

## 2021-01-23 ENCOUNTER — Telehealth: Payer: Self-pay | Admitting: Cardiovascular Disease

## 2021-01-23 ENCOUNTER — Ambulatory Visit: Payer: Medicare HMO

## 2021-01-23 NOTE — Telephone Encounter (Signed)
Patient's states she was seen in the emergency departement yesterday and they told her to double her Lasix. She asks if she should double her potassium also. She also asks needs advice if she should go to cardiac rehab today. Please call to discuss.

## 2021-01-23 NOTE — Progress Notes (Signed)
Patient ID: Penny Mccullough, female    DOB: 02/03/1946, 75 y.o.   MRN: [id number]  HPI  Penny Mccullough is a 75 y/o female with a history of CAD, hyperlipidemia, HTN, stroke, previous tobacco use, COPD and chronic heart failure.   Echo report from 10/29/20 reviewed and showed an EF of 45-50% along with mild LVH, mildly elevated PA pressure of 43.2 mmHg, severe LAE and moderate MR.   LHC done 10/28/20 and showed:  Ost LAD lesion is 75% stenosed.   Mid LAD lesion is 99% stenosed.   A drug-eluting stent was successfully placed using a STENT ONYX FRONTIER 2.5X15.   A drug-eluting stent was successfully placed using a STENT ONYX FRONTIER 2.5X30.   A drug-eluting stent was successfully placed using a STENT ONYX FRONTIER 2.5X30.   Post intervention, there is a 0% residual stenosis.   Post intervention, there is a 0% residual stenosis.   The left ventricular systolic function is normal.   LV end diastolic pressure is normal.  Conclusion Cardiac cath Left main large free of disease Left ventricular function normal a EF of around 55% mild apical hypo- LAD large 75% ostial lesion 99% mid lesion ulcerated TIMI-3 flow Circumflex large left dominant free of disease RCA nondominant Intervention PCI and stent to mid LAD with DES 2.5 x 30 mm DES PCI and stent to ostial LAD 2.5 x 15 mm DES  Was in the ED 01/22/21 due to continued shortness of breath. Given IV lasix and oral diuretic dose changed and she was released.   She presents today for her initial visit with a chief complaint of moderate fatigue upon minimal exertion. She describes this as having been present for several months. She has associated decreased appetite, productive cough and shortness of breath along with this. She denies any dizziness, difficulty sleeping, chest pain, pedal edema, palpitations or abdominal distention.   Has had her diuretic recently increased from the ED but wasn't told to double her potassium. She is only taking the diuretic  in the AM and feels like she urinates "24/7".   Was started on jardiance and then it was stopped because they weren't sure if this was causing her fatigue but she's been just as fatigue even off of it.   Past Medical History:  Diagnosis Date   CAD (coronary artery disease)    CHF (congestive heart failure) (HCC)    COPD (chronic obstructive pulmonary disease) (HCC)    CVA (cerebral vascular accident) (HCC)    HLD (hyperlipidemia)    Hypertension    Past Surgical History:  Procedure Laterality Date   CORONARY STENT INTERVENTION N/A 10/28/2020   Procedure: CORONARY STENT INTERVENTION;  Surgeon: Alwyn Pea, MD;  Location: [facility];  Service: Cardiovascular;  Laterality: N/A;   LEFT HEART CATH AND CORONARY ANGIOGRAPHY N/A 10/28/2020   Procedure: LEFT HEART CATH AND CORONARY ANGIOGRAPHY;  Surgeon: Alwyn Pea, MD;  Location: [facility];  Service: Cardiovascular;  Laterality: N/A;   Family History  Problem Relation Age of Onset   Hypertension Mother    Social History   Tobacco Use   Smoking status: Former    Packs/day: 1.00    Years: 30.00    Pack years: 30.00    Types: Cigarettes    Quit date: 02/21/1987    Years since quitting: 33.9   Smokeless tobacco: Never  Substance Use Topics   Alcohol use: Not Currently   Allergies  Allergen Reactions   Codeine Nausea And  Vomiting   Prior to Admission medications   Medication Sig Start Date End Date Taking? Authorizing Provider  albuterol (VENTOLIN HFA) 108 (90 Base) MCG/ACT inhaler Inhale 2 puffs into the lungs every 6 (six) hours as needed for wheezing or shortness of breath. 01/09/21  Yes Antonieta Iba, MD  aspirin 81 MG EC tablet Take 81 mg by mouth daily.   Yes [provider]  atorvastatin (LIPITOR) 80 MG tablet Take 1 tablet (80 mg total) by mouth daily. 01/09/21  Yes Antonieta Iba, MD  furosemide (LASIX) 20 MG tablet Take 2 tablets (40 mg total) by mouth 1 (one) time a day  01/22/21 01/27/21 Yes Gilles Chiquito, MD  metoprolol succinate (TOPROL XL) 50 MG 24 hr tablet Take 1 tablet (50 mg total) by mouth daily. 01/09/21  Yes Gollan, Tollie Pizza, MD  nitroGLYCERIN (NITROSTAT) 0.4 MG SL tablet Place 1 tablet (0.4 mg total) under the tongue every 5 (five) minutes as needed for chest pain. 12/09/20  Yes Furth, Cadence H, PA-C  potassium chloride (KLOR-CON) 10 MEQ tablet Take 1 tablet (10 mEq total) by mouth daily  01/09/21  Yes Gollan, Tollie Pizza, MD  sacubitril-valsartan (ENTRESTO) 24-26 MG Take 1 tablet by mouth 2 (two) times daily. 01/09/21  Yes Antonieta Iba, MD  ticagrelor (BRILINTA) 90 MG TABS tablet Take 1 tablet (90 mg total) by mouth 2 (two) times daily. 01/09/21  Yes Gollan, Tollie Pizza, MD  predniSONE (DELTASONE) 20 MG tablet Please take 40 mg for 3 days, Then 20 mg for 3 days, Then 10 mg for 3 days Patient not taking: Reported on 01/24/2021 01/09/21   Antonieta Iba, MD    Review of Systems  Constitutional:  Positive for appetite change (decreased) and fatigue.  HENT:  Positive for hearing loss. Negative for congestion and sore throat.   Eyes: Negative.   Respiratory:  Positive for cough (productive) and shortness of breath (minimal). Negative for chest tightness.   Cardiovascular:  Negative for chest pain, palpitations and leg swelling.  Gastrointestinal:  Negative for abdominal distention and abdominal pain.  Endocrine: Negative.   Genitourinary: Negative.   Musculoskeletal:  Negative for back pain and neck pain.  Skin: Negative.   Allergic/Immunologic: Negative.   Neurological:  Negative for dizziness and light-headedness.  Hematological:  Negative for adenopathy. Does not bruise/bleed easily.  Psychiatric/Behavioral:  Negative for dysphoric mood and sleep disturbance (sleeping on 1 pillow). The patient is not nervous/anxious.    Vitals:   01/24/21 1159  BP: 114/65  Pulse: 66  Resp: 18  SpO2: 100%  Weight: 128 lb 4 oz (58.2 kg)  Height: 5'  (1.524 m)   Wt Readings from Last 3 Encounters:  01/24/21 128 lb 4 oz (58.2 kg)  01/22/21 134 lb 8 oz (61 kg)  01/09/21 134 lb 8 oz (61 kg)   Lab Results  Component Value Date   CREATININE 0.69 01/22/2021   CREATININE 0.85 12/14/2020   CREATININE 0.74 11/28/2020    Physical Exam Vitals and nursing note reviewed. Chaperone present: daughter.  Constitutional:      Appearance: Normal appearance.  HENT:     Head: Normocephalic and atraumatic.     Right Ear: Decreased hearing noted.     Left Ear: Decreased hearing noted.  Cardiovascular:     Rate and Rhythm: Normal rate and regular rhythm.  Pulmonary:     Effort: Pulmonary effort is normal.     Breath sounds: Rhonchi (bilateral upper lobes) present. No wheezing or  rales.  Abdominal:     General: There is no distension.     Palpations: Abdomen is soft.  Musculoskeletal:        General: No tenderness.     Cervical back: Normal range of motion and neck supple.     Right lower leg: No edema.     Left lower leg: No edema.  Skin:    General: Skin is warm and dry.  Neurological:     General: No focal deficit present.     Mental Status: She is alert and oriented to person, place, and time.  Psychiatric:        Mood and Affect: Mood normal.        Behavior: Behavior normal.        Thought Content: Thought content normal.    Assessment & Plan:  1: Chronic heart failure with mildly reduced ejection fraction with structural changes (LAE/LVH)- - NYHA III - euvolemic today - weighing daily and reminded to call for an overnight weight gain of > 2 pounds or a weekly weight gain of > 5 pounds - not adding salt to her food and tries to eat low sodium foods; low sodium cookbook provided  - on GDMT of entresto; felt like jardiance made her fatigued so it was previously stopped but fatigue has continued; she is willing to try it again so will resume as 10mg  daily - can continue furosemide as 40mg  every morning but instructed to double her  potassium  as well during this time - will check BMP at next visit - BNP 01/22/21 was 1905.6  2: HTN- - BP looks good (114/65) - daughter says that she's trying to get patient approved for medicaid so she can start seeing , MD - BMP 01/22/21 reviewed and showed sodium 138, potassium 3.3, creatinine 0.69 and GFR >60  3: COPD- - has albuterol inhaler PRN   4: CAD- - saw cardiology Corky Downs) 01/09/21 - remains on brilinta   Patient did not bring her medications nor a list. Each medication was verbally reviewed with the patient and she was encouraged to bring the bottles to every visit to confirm accuracy of list.   Return in 1 week or sooner for any questions/problems before then.

## 2021-01-23 NOTE — Telephone Encounter (Signed)
Was able to reach out to Penny Mccullough, she reports unable to make cardiac rehab today d/t increased SOB, seen in the ED yesterday for same and was advised to increased her lasix to 20 mg daily.   Advised pt if she cannot make there appt, then she needs to call rehab to let them know so they can reschedule her, as "no show" may disqualify her from the program, Penny Mccullough verbalized understanding.   Also, informed her that Clarisa Kindred, NP will be able to address her medications tomorrow at her appt, pt was unaware of her appt with [facility], advised on time at 12 pm and educated her on HF clinic with her CHF w/SOB exacerbation. Penny Mccullough agrees to keep appt. F/u with Dr. Mariah Milling as well on 12/19.  Penny Mccullough thankful for the return call and will see Clarisa Kindred, NP tomorrow.

## 2021-01-24 ENCOUNTER — Ambulatory Visit: Payer: Medicare HMO | Attending: Family | Admitting: Family

## 2021-01-24 ENCOUNTER — Encounter: Payer: Self-pay | Admitting: Family

## 2021-01-24 ENCOUNTER — Other Ambulatory Visit: Payer: Self-pay

## 2021-01-24 VITALS — BP 114/65 | HR 66 | Resp 18 | Ht 60.0 in | Wt 128.2 lb

## 2021-01-24 DIAGNOSIS — R058 Other specified cough: Secondary | ICD-10-CM | POA: Diagnosis not present

## 2021-01-24 DIAGNOSIS — I340 Nonrheumatic mitral (valve) insufficiency: Secondary | ICD-10-CM | POA: Diagnosis not present

## 2021-01-24 DIAGNOSIS — I509 Heart failure, unspecified: Secondary | ICD-10-CM | POA: Insufficient documentation

## 2021-01-24 DIAGNOSIS — I5022 Chronic systolic (congestive) heart failure: Secondary | ICD-10-CM | POA: Diagnosis not present

## 2021-01-24 DIAGNOSIS — J449 Chronic obstructive pulmonary disease, unspecified: Secondary | ICD-10-CM | POA: Insufficient documentation

## 2021-01-24 DIAGNOSIS — I10 Essential (primary) hypertension: Secondary | ICD-10-CM | POA: Diagnosis not present

## 2021-01-24 DIAGNOSIS — Z87891 Personal history of nicotine dependence: Secondary | ICD-10-CM | POA: Diagnosis not present

## 2021-01-24 DIAGNOSIS — I2510 Atherosclerotic heart disease of native coronary artery without angina pectoris: Secondary | ICD-10-CM | POA: Insufficient documentation

## 2021-01-24 DIAGNOSIS — E785 Hyperlipidemia, unspecified: Secondary | ICD-10-CM | POA: Insufficient documentation

## 2021-01-24 DIAGNOSIS — Z955 Presence of coronary angioplasty implant and graft: Secondary | ICD-10-CM | POA: Insufficient documentation

## 2021-01-24 DIAGNOSIS — I110 Hypertensive heart disease with heart failure: Secondary | ICD-10-CM | POA: Insufficient documentation

## 2021-01-24 DIAGNOSIS — R5383 Other fatigue: Secondary | ICD-10-CM | POA: Insufficient documentation

## 2021-01-24 DIAGNOSIS — I25118 Atherosclerotic heart disease of native coronary artery with other forms of angina pectoris: Secondary | ICD-10-CM | POA: Diagnosis not present

## 2021-01-24 DIAGNOSIS — Z79899 Other long term (current) drug therapy: Secondary | ICD-10-CM | POA: Insufficient documentation

## 2021-01-24 NOTE — Patient Instructions (Addendum)
Continue weighing daily and call for an overnight weight gain of 3 pounds or more or a weekly weight gain of more than 5 pounds.    Double potassium while taking extra furosemide dose   Resume taking jardiance as 1 tablet every morning.

## 2021-01-31 ENCOUNTER — Other Ambulatory Visit: Payer: Self-pay

## 2021-01-31 ENCOUNTER — Ambulatory Visit: Payer: Medicare HMO | Attending: Family | Admitting: Family

## 2021-01-31 ENCOUNTER — Other Ambulatory Visit
Admission: RE | Admit: 2021-01-31 | Discharge: 2021-01-31 | Disposition: A | Discharge status: Home / Self Care | Payer: Medicare HMO | Source: Ambulatory Visit | Attending: Family | Admitting: Family

## 2021-01-31 ENCOUNTER — Encounter: Payer: Self-pay | Admitting: Family

## 2021-01-31 VITALS — BP 101/53 | HR 68 | Resp 18 | Ht 61.0 in | Wt 129.2 lb

## 2021-01-31 DIAGNOSIS — I10 Essential (primary) hypertension: Secondary | ICD-10-CM | POA: Diagnosis not present

## 2021-01-31 DIAGNOSIS — I110 Hypertensive heart disease with heart failure: Secondary | ICD-10-CM | POA: Insufficient documentation

## 2021-01-31 DIAGNOSIS — J449 Chronic obstructive pulmonary disease, unspecified: Secondary | ICD-10-CM | POA: Diagnosis not present

## 2021-01-31 DIAGNOSIS — I25118 Atherosclerotic heart disease of native coronary artery with other forms of angina pectoris: Secondary | ICD-10-CM

## 2021-01-31 DIAGNOSIS — E785 Hyperlipidemia, unspecified: Secondary | ICD-10-CM | POA: Insufficient documentation

## 2021-01-31 DIAGNOSIS — I2510 Atherosclerotic heart disease of native coronary artery without angina pectoris: Secondary | ICD-10-CM | POA: Insufficient documentation

## 2021-01-31 DIAGNOSIS — Z8673 Personal history of transient ischemic attack (TIA), and cerebral infarction without residual deficits: Secondary | ICD-10-CM | POA: Diagnosis not present

## 2021-01-31 DIAGNOSIS — Z87891 Personal history of nicotine dependence: Secondary | ICD-10-CM | POA: Diagnosis not present

## 2021-01-31 DIAGNOSIS — I509 Heart failure, unspecified: Secondary | ICD-10-CM | POA: Insufficient documentation

## 2021-01-31 DIAGNOSIS — I5022 Chronic systolic (congestive) heart failure: Secondary | ICD-10-CM | POA: Diagnosis not present

## 2021-01-31 LAB — BASIC METABOLIC PANEL
Anion gap: 6 (ref 5–15)
BUN: 20 mg/dL (ref 8–23)
CO2: 26 mmol/L (ref 22–32)
Calcium: 8.9 mg/dL (ref 8.9–10.3)
Chloride: 106 mmol/L (ref 98–111)
Creatinine, Ser: 1.02 mg/dL — ABNORMAL HIGH (ref 0.44–1.00)
GFR, Estimated: 58 mL/min — ABNORMAL LOW (ref >60)
Glucose, Bld: 100 mg/dL — ABNORMAL HIGH (ref 70–99)
Potassium: 3.8 mmol/L (ref 3.5–5.1)
Sodium: 138 mmol/L (ref 135–145)

## 2021-01-31 NOTE — Progress Notes (Signed)
Patient ID: Penny Mccullough, female    DOB: 22-Apr-1945, 75 y.o.   MRN: [id number]  HPI  Ms Saperstein is a 75 y/o female with a history of CAD, hyperlipidemia, HTN, stroke, previous tobacco use, COPD and chronic heart failure.   Echo report from 10/29/20 reviewed and showed an EF of 45-50% along with mild LVH, mildly elevated PA pressure of 43.2 mmHg, severe LAE and moderate MR.   LHC done 10/28/20 and showed:  Ost LAD lesion is 75% stenosed.   Mid LAD lesion is 99% stenosed.   A drug-eluting stent was successfully placed using a STENT ONYX FRONTIER 2.5X15.   A drug-eluting stent was successfully placed using a STENT ONYX FRONTIER 2.5X30.   A drug-eluting stent was successfully placed using a STENT ONYX FRONTIER 2.5X30.   Post intervention, there is a 0% residual stenosis.   Post intervention, there is a 0% residual stenosis.   The left ventricular systolic function is normal.   LV end diastolic pressure is normal.  Conclusion Cardiac cath Left main large free of disease Left ventricular function normal a EF of around 55% mild apical hypo- LAD large 75% ostial lesion 99% mid lesion ulcerated TIMI-3 flow Circumflex large left dominant free of disease RCA nondominant Intervention PCI and stent to mid LAD with DES 2.5 x 30 mm DES PCI and stent to ostial LAD 2.5 x 15 mm DES  Was in the ED 01/22/21 due to continued shortness of breath. Given IV lasix and oral diuretic dose changed and she was released.   She presents today for a follow-up visit with a chief complaint of minimal shortness of breath upon moderate exertion. She describes this as having been present for several years although has improved since she was last here. She has associated fatigue and cough along with this. She denies and dizziness, difficulty sleeping, abdominal distention, palpitations, pedal edema, chest pain or weight gain.   Resumed jardiance at last visit and feels better. Currently taking 20mg  furosemide and  potassium daily with additional dose if needed. She says that she's taken an additional dose just once in the last week.   Past Medical History:  Diagnosis Date   CAD (coronary artery disease)    CHF (congestive heart failure) (HCC)    COPD (chronic obstructive pulmonary disease) (HCC)    CVA (cerebral vascular accident) (HCC)    HLD (hyperlipidemia)    Hypertension    Past Surgical History:  Procedure Laterality Date   CORONARY STENT INTERVENTION N/A 10/28/2020   Procedure: CORONARY STENT INTERVENTION;  Surgeon: 12/28/2020, MD;  Location: [facility];  Service: Cardiovascular;  Laterality: N/A;   LEFT HEART CATH AND CORONARY ANGIOGRAPHY N/A 10/28/2020   Procedure: LEFT HEART CATH AND CORONARY ANGIOGRAPHY;  Surgeon: 12/28/2020, MD;  Location: [facility];  Service: Cardiovascular;  Laterality: N/A;   Family History  Problem Relation Age of Onset   Hypertension Mother    Social History   Tobacco Use   Smoking status: Former    Packs/day: 1.00    Years: 30.00    Pack years: 30.00    Types: Cigarettes    Quit date: 02/21/1987    Years since quitting: 33.9   Smokeless tobacco: Never  Substance Use Topics   Alcohol use: Not Currently   Allergies  Allergen Reactions   Codeine Nausea And Vomiting   Prior to Admission medications   Medication Sig Start Date End Date Taking? Authorizing Provider  albuterol (  VENTOLIN HFA) 108 (90 Base) MCG/ACT inhaler Inhale 2 puffs into the lungs every 6 (six) hours as needed for wheezing or shortness of breath. 01/09/21  Yes Antonieta Iba, MD  aspirin 81 MG EC tablet Take 81 mg by mouth daily.   Yes [provider]  atorvastatin (LIPITOR) 80 MG tablet Take 1 tablet (80 mg total) by mouth daily. 01/09/21  Yes Gollan, Tollie Pizza, MD  empagliflozin (JARDIANCE) 10 MG TABS tablet Take by mouth daily.   Yes [provider]  furosemide (LASIX) 20 MG tablet Take 2 tablets (40 mg total) by mouth 2 (two)  times daily for 5 days. For shortness of breath or leg swelling 01/22/21 01/31/21 Yes Gilles Chiquito, MD  metoprolol succinate (TOPROL XL) 50 MG 24 hr tablet Take 1 tablet (50 mg total) by mouth daily. 01/09/21  Yes Gollan, Tollie Pizza, MD  nitroGLYCERIN (NITROSTAT) 0.4 MG SL tablet Place 1 tablet (0.4 mg total) under the tongue every 5 (five) minutes as needed for chest pain. 12/09/20  Yes Furth, Cadence H, PA-C  potassium chloride (KLOR-CON) 10 MEQ tablet Take 1 tablet (10 mEq total) by mouth daily as needed. Take when taking Lasix Patient taking differently: Take 20 mEq by mouth daily as needed. Take when taking Lasix 01/09/21  Yes Gollan, Tollie Pizza, MD  sacubitril-valsartan (ENTRESTO) 24-26 MG Take 1 tablet by mouth 2 (two) times daily. 01/09/21  Yes Antonieta Iba, MD  ticagrelor (BRILINTA) 90 MG TABS tablet Take 1 tablet (90 mg total) by mouth 2 (two) times daily. 01/09/21  Yes Gollan, Tollie Pizza, MD  predniSONE (DELTASONE) 20 MG tablet Please take 40 mg for 3 days, Then 20 mg for 3 days, Then 10 mg for 3 days Patient not taking: Reported on 01/24/2021 01/09/21   Antonieta Iba, MD   Review of Systems  Constitutional:  Positive for appetite change (decreased) and fatigue.  HENT:  Positive for hearing loss. Negative for congestion and sore throat.   Eyes: Negative.   Respiratory:  Positive for cough (productive) and shortness of breath (minimal). Negative for chest tightness.   Cardiovascular:  Negative for chest pain, palpitations and leg swelling.  Gastrointestinal:  Negative for abdominal distention and abdominal pain.  Endocrine: Negative.   Genitourinary: Negative.   Musculoskeletal:  Negative for back pain and neck pain.  Skin: Negative.   Allergic/Immunologic: Negative.   Neurological:  Negative for dizziness and light-headedness.  Hematological:  Negative for adenopathy. Does not bruise/bleed easily.  Psychiatric/Behavioral:  Negative for dysphoric mood and sleep disturbance  (sleeping on 1 pillow). The patient is not nervous/anxious.    Vitals:   01/31/21 1540  BP: (!) 101/53  Pulse: 68  Resp: 18  SpO2: 100%  Weight: 129 lb 4 oz (58.6 kg)  Height: 5\' 1"  (1.549 m)   Wt Readings from Last 3 Encounters:  01/31/21 129 lb 4 oz (58.6 kg)  01/24/21 128 lb 4 oz (58.2 kg)  01/22/21 134 lb 8 oz (61 kg)   Lab Results  Component Value Date   CREATININE 1.02 (H) 01/31/2021   CREATININE 0.69 01/22/2021   CREATININE 0.85 12/14/2020   Physical Exam Vitals and nursing note reviewed. Chaperone present: daughter.  Constitutional:      Appearance: Normal appearance.  HENT:     Head: Normocephalic and atraumatic.     Right Ear: Decreased hearing noted.     Left Ear: Decreased hearing noted.  Cardiovascular:     Rate and Rhythm: Normal rate and  regular rhythm.  Pulmonary:     Effort: Pulmonary effort is normal.     Breath sounds: Rhonchi (Left upper lobe) present. No wheezing or rales.  Abdominal:     General: There is no distension.     Palpations: Abdomen is soft.  Musculoskeletal:        General: No tenderness.     Cervical back: Normal range of motion and neck supple.     Right lower leg: No edema.     Left lower leg: No edema.  Skin:    General: Skin is warm and dry.  Neurological:     General: No focal deficit present.     Mental Status: She is alert and oriented to person, place, and time.  Psychiatric:        Mood and Affect: Mood normal.        Behavior: Behavior normal.        Thought Content: Thought content normal.   Assessment & Plan:  1: Chronic heart failure with mildly reduced ejection fraction with structural changes (LAE/LVH)- - NYHA II - euvolemic today - weighing daily and reminded to call for an overnight weight gain of > 2 pounds or a weekly weight gain of > 5 pounds - weight stable from last visit here - not adding salt to her food and tries to eat low sodium foods - on GDMT of entresto & jardiance (feels better since  resuming this) - will check BMP today as potassium was doubled at last visit due to doubling of her furosemide; she says she took the doubled dose once since last here and is now back on 1 tablet of each daily - BNP 01/22/21 was 1905.6  2: HTN- - BP looks good (101/53) - daughter says that she's trying to get patient approved for medicaid so she can start seeing Corky Downs, MD - BMP 01/22/21 reviewed and showed sodium 138, potassium 3.3, creatinine 0.69 and GFR >60  3: COPD- - has albuterol inhaler PRN   4: CAD- - saw cardiology Mariah Milling) 01/09/21 - remains on brilinta   Patient did not bring her medications nor a list. Each medication was verbally reviewed with the patient and she was encouraged to bring the bottles to every visit to confirm accuracy of list.    Return in 1 month or sooner for any questions/problems before then.

## 2021-01-31 NOTE — Patient Instructions (Signed)
Continue weighing daily and call for an overnight weight gain of 3 pounds or more or a weekly weight gain of more than 5 pounds.  °

## 2021-02-01 ENCOUNTER — Telehealth: Payer: Self-pay

## 2021-02-01 NOTE — Telephone Encounter (Addendum)
Lab results below from provider were called to patient. Patient verbalized understanding. Patient instructed to call if any questions or concerns arise before next appointment on 03/08/21 Suanne Marker, RN ----- Message from Delma Freeze, FNP sent at 02/01/2021  9:25 AM EST ----- Potassium and sodium look great. Kidney function is very slightly worse than from the ER but that is common with the fluid pill. Continue medications and we will recheck labs at next visit

## 2021-02-02 ENCOUNTER — Ambulatory Visit: Payer: Medicare HMO | Admitting: Family

## 2021-02-06 ENCOUNTER — Ambulatory Visit (INDEPENDENT_AMBULATORY_CARE_PROVIDER_SITE_OTHER): Payer: Medicare HMO | Admitting: Cardiovascular Disease

## 2021-02-06 ENCOUNTER — Other Ambulatory Visit: Payer: Self-pay

## 2021-02-06 ENCOUNTER — Encounter: Payer: Self-pay | Admitting: Cardiovascular Disease

## 2021-02-06 VITALS — BP 80/60 | HR 119 | Ht 60.0 in | Wt 125.2 lb

## 2021-02-06 DIAGNOSIS — I25118 Atherosclerotic heart disease of native coronary artery with other forms of angina pectoris: Secondary | ICD-10-CM

## 2021-02-06 DIAGNOSIS — I480 Paroxysmal atrial fibrillation: Secondary | ICD-10-CM

## 2021-02-06 DIAGNOSIS — I959 Hypotension, unspecified: Secondary | ICD-10-CM

## 2021-02-06 DIAGNOSIS — I499 Cardiac arrhythmia, unspecified: Secondary | ICD-10-CM

## 2021-02-06 MED ORDER — APIXABAN 5 MG PO TABS: 5.0000 mg | ORAL_TABLET | Freq: Two times a day (BID) | ORAL | 3 refills | Status: DC

## 2021-02-06 MED ORDER — DIGOXIN 125 MCG PO TABS: 0.1250 mg | ORAL_TABLET | Freq: Every day | ORAL | 3 refills | Status: DC

## 2021-02-06 MED ORDER — POTASSIUM CHLORIDE ER 10 MEQ PO TBCR: 20.0000 meq | EXTENDED_RELEASE_TABLET | Freq: Every day | ORAL | 3 refills | Status: DC | PRN

## 2021-02-06 MED ORDER — CLOPIDOGREL BISULFATE 75 MG PO TABS: 75.0000 mg | ORAL_TABLET | Freq: Every day | ORAL | 3 refills | Status: DC

## 2021-02-06 MED ORDER — METOPROLOL SUCCINATE ER 50 MG PO TB24: 75.0000 mg | ORAL_TABLET | Freq: Every day | ORAL | 3 refills | Status: DC

## 2021-02-06 MED ORDER — FUROSEMIDE 20 MG PO TABS: 40.0000 mg | ORAL_TABLET | Freq: Every day | ORAL | 3 refills | Status: DC

## 2021-02-06 NOTE — Progress Notes (Addendum)
Cardiology Office Note  Date:  02/06/2021   ID:  Penny Mccullough, DOB [birth year], MRN [id number]  PCP:  Pcp, No   Chief Complaint  Patient presents with   1 month follow up     [facility] follow up; CHF. Patient c/o shortness of breath, cough and congestion. Medications reviewed by the patient verbally.      HPI:  Penny Mccullough is a 75 y.o. female with a hx of  CAD with remote CABG approximately 30 years ago (suspected to be a LIMA-LAD per history),  CVA,  HLD,  COPD /tobacco /chronit bronchitis Ejection fraction 45 to 50% Moderate MR, severely dilated left atrium NSTEMI s/p PCI to ostial and mid LAD 10/28/20.  Hard of hearing Who presents for follow-up of her coronary disease  Recent events discussed 11/21: in office Stopped jardiance as she reported general malaise, felt it was from the medication After stopping the medication did not feel any better per the daughter --- Took course of prednisone for shortness of breath, reports that she felt better  In the ER, 01/22/2021, SOB, reported weight was higher Given lasix IV Was told to increase Lasix up to 40 twice daily for 5 days  Seen by CHF clinic 101/50 Told to go back on jardiance  High tea intake Daughter thought she was only taking Lasix as needed Penny Mccullough reports that she is taking Lasix 40 daily with potassium 10  Still complaining of shortness of breath Abdominal distention has resolved, no leg swelling  New finding on EKG, new onset atrial fibrillation with rapid rate 119 bpm She reports that she did not appreciate any tachypalpitations  EKG personally reviewed by myself on todays visit Atrial fibrillation ventricular rate 119 bpm  Other past medical history reviewed September 2022 NSTEMI s/p PCI to ostial and mid LAD 10/28/20.   Echo showed EF 45-50% with HK of basal to mid inferior and inferolateral wall.   remote CABG x 1, at which time she was told "they took a main artery from my chest," and appears to be a  LIMA to LAD.     PMH:   has a past medical history of CAD (coronary artery disease), CHF (congestive heart failure) (HCC), COPD (chronic obstructive pulmonary disease) (HCC), CVA (cerebral vascular accident) (HCC), HLD (hyperlipidemia), and Hypertension.  PSH:    Past Surgical History:  Procedure Laterality Date   CORONARY STENT INTERVENTION N/A 10/28/2020   Procedure: CORONARY STENT INTERVENTION;  Surgeon: Alwyn Pea, MD;  Location: [facility] INVASIVE CV LAB;  Service: Cardiovascular;  Laterality: N/A;   LEFT HEART CATH AND CORONARY ANGIOGRAPHY N/A 10/28/2020   Procedure: LEFT HEART CATH AND CORONARY ANGIOGRAPHY;  Surgeon: Alwyn Pea, MD;  Location: [facility] INVASIVE CV LAB;  Service: Cardiovascular;  Laterality: N/A;    Current Outpatient Medications  Medication Sig Dispense Refill   albuterol (VENTOLIN HFA) 108 (90 Base) MCG/ACT inhaler Inhale 2 puffs into the lungs every 6 (six) hours as needed for wheezing or shortness of breath. 8 g 0   apixaban (ELIQUIS) 5 MG TABS tablet Take 1 tablet (5 mg total) by mouth 2 (two) times daily. 90 tablet 3   aspirin 81 MG EC tablet Take 81 mg by mouth daily.     atorvastatin (LIPITOR) 80 MG tablet Take 1 tablet (80 mg total) by mouth daily. 90 tablet 3   clopidogrel (PLAVIX) 75 MG tablet Take 1 tablet (75 mg total) by mouth daily. 92 tablet 3   digoxin (LANOXIN) 0.125 MG tablet Take  1 tablet (0.125 mg total) by mouth daily. 90 tablet 3   empagliflozin (JARDIANCE) 10 MG TABS tablet Take by mouth daily.     nitroGLYCERIN (NITROSTAT) 0.4 MG SL tablet Place 1 tablet (0.4 mg total) under the tongue every 5 (five) minutes as needed for chest pain. 25 tablet 2   furosemide (LASIX) 20 MG tablet Take 2 tablets (40 mg total) by mouth daily. 180 tablet 3   metoprolol succinate (TOPROL XL) 50 MG 24 hr tablet Take 1.5 tablets (75 mg total) by mouth daily. 140 tablet 3   potassium chloride (KLOR-CON) 10 MEQ tablet Take 2 tablets (20 mEq total) by mouth daily as  needed. Take when taking Lasix 180 tablet 3   No current facility-administered medications for this visit.     Allergies:   Codeine   Social History:  The patient  reports that she quit smoking about 33 years ago. Her smoking use included cigarettes. She has a 30.00 pack-year smoking history. She has never used smokeless tobacco. She reports that she does not currently use alcohol.   Family History:   family history includes Hypertension in her mother.    Review of Systems: Review of Systems  Constitutional: Negative.   HENT: Negative.    Respiratory:  Positive for shortness of breath.   Cardiovascular: Negative.   Gastrointestinal: Negative.   Musculoskeletal: Negative.   Neurological: Negative.   Psychiatric/Behavioral: Negative.    All other systems reviewed and are negative.   PHYSICAL EXAM: VS:  BP (!) 80/60 (BP Location: Left Arm, Patient Position: Sitting, Cuff Size: Normal)    Pulse (!) 119    Ht 5' (1.524 m)    Wt 125 lb 4 oz (56.8 kg)    SpO2 98%    BMI 24.46 kg/m  , BMI Body mass index is 24.46 kg/m. GEN: Well nourished, well developed, in no acute distress HEENT: normal Neck: no JVD, carotid bruits, or masses Cardiac: RRR; no murmurs, rubs, or gallops,no edema  Respiratory:  clear to auscultation bilaterally, normal work of breathing GI: soft, nontender, nondistended, + BS MS: no deformity or atrophy Skin: warm and dry, no rash Neuro:  Strength and sensation are intact Psych: euthymic mood, full affect  Recent Labs: 12/14/2020: ALT 16 01/22/2021: B Natriuretic Peptide 1,905.6; Hemoglobin 13.2; Magnesium 2.2; Platelets 444 01/31/2021: BUN 20; Creatinine, Ser 1.02; Potassium 3.8; Sodium 138    Lipid Panel Lab Results  Component Value Date   CHOL 144 12/14/2020   HDL 43 12/14/2020   LDLCALC NOT CALCULATED 12/14/2020   TRIG 126 12/14/2020      Wt Readings from Last 3 Encounters:  02/06/21 125 lb 4 oz (56.8 kg)  01/31/21 129 lb 4 oz (58.6 kg)   01/24/21 128 lb 4 oz (58.2 kg)     ASSESSMENT AND PLAN:  Problem List Items Addressed This Visit       Cardiology Problems   CAD (coronary artery disease) - Primary   Relevant Medications   apixaban (ELIQUIS) 5 MG TABS tablet   digoxin (LANOXIN) 0.125 MG tablet   metoprolol succinate (TOPROL XL) 50 MG 24 hr tablet   furosemide (LASIX) 20 MG tablet   Other Visit Diagnoses     Paroxysmal atrial fibrillation (HCC)       Relevant Medications   clopidogrel (PLAVIX) 75 MG tablet   apixaban (ELIQUIS) 5 MG TABS tablet   digoxin (LANOXIN) 0.125 MG tablet   metoprolol succinate (TOPROL XL) 50 MG 24 hr tablet   furosemide (  LASIX) 20 MG tablet   Other Relevant Orders   EKG 12-Lead   Hypotension, unspecified hypotension type       Relevant Medications   apixaban (ELIQUIS) 5 MG TABS tablet   digoxin (LANOXIN) 0.125 MG tablet   metoprolol succinate (TOPROL XL) 50 MG 24 hr tablet   furosemide (LASIX) 20 MG tablet   Other Relevant Orders   EKG 12-Lead   Irregular heart rate       Relevant Medications   apixaban (ELIQUIS) 5 MG TABS tablet   digoxin (LANOXIN) 0.125 MG tablet   metoprolol succinate (TOPROL XL) 50 MG 24 hr tablet     Coronary disease with stable angina Recent PCI to ostial and mid LAD Given arrhythmia/atrial fibrillation as detailed below, Will change the Brilinta and aspirin to Plavix alone Plavix load 300 mg starting tomorrow with 75 mg daily after that  Atrial fibrillation with RVR Timing of onset unclear likely several days ago as she reports worsening of her shortness of breath after she stopped prednisone taper Denies tachypalpitations, sitting comfortably today, low blood pressure likely from tachyarrhythmia but is not having orthostasis symptoms --- Recommend she stop Entresto for now --- Metoprolol succinate up to 75 daily --- Start digoxin 0.25 mg first dose then 0.125 daily after that --- Eliquis 5 twice daily --- Plan for close follow-up in clinic in 1  week if possible, first available suggested --- Continue Lasix 40 daily with potassium     Total encounter time more than 35 minutes  Greater than 50% was spent in counseling and coordination of care with the patient    Signed, Dossie Arbour, M.D., Ph.D. [facility] [place], [place] [phone]

## 2021-02-06 NOTE — Patient Instructions (Addendum)
Medication Instructions:  Please STOP Brilinta Entresto  Please START Plavix 75 mg daily  First loading dose take 300 mg (4 pills) on day one Then one tab daily Eliquis 5 mg twice a day  for atrial fibrillation Digoxin 0.125 mg daily First day take 0.25  Then 0.125 mg daily  Please INCREASE the metoprolol up to 75 mg daily  Please CONTINUE  Lasix 40 daily  Potassium 20 meq daily  If you need a refill on your cardiac medications before your next appointment, please call your pharmacy.    Lab work: No new labs needed  Testing/Procedures: No new testing needed  Follow-Up: At [facility], you and your health needs are our priority.  As part of our continuing mission to provide you with exceptional heart care, we have created designated Provider Care Teams.  These Care Teams include your primary Cardiologist (physician) and Advanced Practice Providers (APPs -  Physician Assistants and Nurse Practitioners) who all work together to provide you with the care you need, when you need it.  You will need a follow up appointment in first available with   [facility] or APP  Providers on your designated Care Team:   Nicolasa Ducking, NP Eula Listen, PA-C Cadence Fransico [place], [place]  COVID-19 Vaccine Information can be found at: [email or web address] For questions related to vaccine distribution or appointments, please email [email or web address] or call [phone].

## 2021-02-16 ENCOUNTER — Encounter: Payer: Medicare HMO | Attending: Cardiovascular Disease

## 2021-02-16 ENCOUNTER — Other Ambulatory Visit: Payer: Self-pay

## 2021-02-16 VITALS — Ht 60.0 in | Wt 131.9 lb

## 2021-02-16 DIAGNOSIS — I214 Non-ST elevation (NSTEMI) myocardial infarction: Secondary | ICD-10-CM | POA: Insufficient documentation

## 2021-02-16 DIAGNOSIS — Z955 Presence of coronary angioplasty implant and graft: Secondary | ICD-10-CM | POA: Diagnosis present

## 2021-02-16 NOTE — Patient Instructions (Signed)
Patient Instructions  Patient Details  Name: Penny Mccullough MRN: [id number] Date of Birth: 1945-10-20 Referring Provider:  Antonieta Iba, MD  Below are your personal goals for exercise, nutrition, and risk factors. Our goal is to help you stay on track towards obtaining and maintaining these goals. We will be discussing your progress on these goals with you throughout the program.  Initial Exercise Prescription:  Initial Exercise Prescription - 02/16/21 1500       Date of Initial Exercise RX and Referring Provider   Date 02/16/21    Referring Provider [facility]      Oxygen   Maintain Oxygen Saturation 88% or higher      Treadmill   MPH 1.8    Grade 0.5    Minutes 15    METs 2.5      NuStep   Level 1    SPM 80    Minutes 15    METs 2.4      REL-XR   Level 1    Speed 50    Minutes 15    METs 2.4      Biostep-RELP   Level 1    SPM 50    Minutes 15    METs 2      Prescription Details   Frequency (times per week) 3    Duration Progress to 30 minutes of continuous aerobic without signs/symptoms of physical distress      Intensity   THRR 40-80% of Max Heartrate 121-138    Ratings of Perceived Exertion 11-13    Perceived Dyspnea 0-4      Resistance Training   Training Prescription Yes    Weight 3 lb    Reps 10-15             Exercise Goals: Frequency: Be able to perform aerobic exercise two to three times per week in program working toward 2-5 days per week of home exercise.  Intensity: Work with a perceived exertion of 11 (fairly light) - 15 (hard) while following your exercise prescription.  We will make changes to your prescription with you as you progress through the program.   Duration: Be able to do 30 to 45 minutes of continuous aerobic exercise in addition to a 5 minute warm-up and a 5 minute cool-down routine.   Nutrition Goals: Your personal nutrition goals will be established when you do your nutrition analysis with the dietician.  The  following are general nutrition guidelines to follow: Cholesterol < 200mg /day Sodium < 1500mg /day Fiber: Women over 50 yrs - 21 grams per day  Personal Goals:  Personal Goals and Risk Factors at Admission - 02/16/21 1527       Core Components/Risk Factors/Patient Goals on Admission    Weight Management Yes;Weight Maintenance    Intervention Weight Management: Develop a combined nutrition and exercise program designed to reach desired caloric intake, while maintaining appropriate intake of nutrient and fiber, sodium and fats, and appropriate energy expenditure required for the weight goal.;Weight Management: Provide education and appropriate resources to help participant work on and attain dietary goals.;Weight Management/Obesity: Establish reasonable short term and long term weight goals.    Expected Outcomes Short Term: Continue to assess and modify interventions until short term weight is achieved;Long Term: Adherence to nutrition and physical activity/exercise program aimed toward attainment of established weight goal;Weight Maintenance: Understanding of the daily nutrition guidelines, which includes 25-35% calories from fat, 7% or less cal from saturated fats, less than 200mg  cholesterol, less than 1.5gm of sodium, &  5 or more servings of fruits and vegetables daily;Understanding recommendations for meals to include 15-35% energy as protein, 25-35% energy from fat, 35-60% energy from carbohydrates, less than 200mg  of dietary cholesterol, 20-35 gm of total fiber daily;Understanding of distribution of calorie intake throughout the day with the consumption of 4-5 meals/snacks    Hypertension Yes    Intervention Provide education on lifestyle modifcations including regular physical activity/exercise, weight management, moderate sodium restriction and increased consumption of fresh fruit, vegetables, and low fat dairy, alcohol moderation, and smoking cessation.;Monitor prescription use compliance.     Expected Outcomes Short Term: Continued assessment and intervention until BP is < 140/90mm HG in hypertensive participants. < 130/81mm HG in hypertensive participants with diabetes, heart failure or chronic kidney disease.;Long Term: Maintenance of blood pressure at goal levels.    Lipids Yes    Intervention Provide education and support for participant on nutrition & aerobic/resistive exercise along with prescribed medications to achieve LDL 70mg , HDL >40mg .    Expected Outcomes Short Term: Participant states understanding of desired cholesterol values and is compliant with medications prescribed. Participant is following exercise prescription and nutrition guidelines.;Long Term: Cholesterol controlled with medications as prescribed, with individualized exercise RX and with personalized nutrition plan. Value goals: LDL < 70mg , HDL > 40 mg.             Tobacco Use Initial Evaluation: Social History   Tobacco Use  Smoking Status Former   Packs/day: 1.00   Years: 30.00   Pack years: 30.00   Types: Cigarettes   Quit date: 02/21/1987   Years since quitting: 34.0  Smokeless Tobacco Never    Exercise Goals and Review:  Exercise Goals     Row Name 02/16/21 1519             Exercise Goals   Increase Physical Activity Yes       Intervention Provide advice, education, support and counseling about physical activity/exercise needs.;Develop an individualized exercise prescription for aerobic and resistive training based on initial evaluation findings, risk stratification, comorbidities and participant's personal goals.       Expected Outcomes Short Term: Attend rehab on a regular basis to increase amount of physical activity.;Long Term: Add in home exercise to make exercise part of routine and to increase amount of physical activity.;Long Term: Exercising regularly at least 3-5 days a week.       Increase Strength and Stamina Yes       Intervention Provide advice, education, support and  counseling about physical activity/exercise needs.;Develop an individualized exercise prescription for aerobic and resistive training based on initial evaluation findings, risk stratification, comorbidities and participant's personal goals.       Expected Outcomes Short Term: Increase workloads from initial exercise prescription for resistance, speed, and METs.;Short Term: Perform resistance training exercises routinely during rehab and add in resistance training at home;Long Term: Improve cardiorespiratory fitness, muscular endurance and strength as measured by increased METs and functional capacity (04/21/1987)       Able to understand and use rate of perceived exertion (RPE) scale Yes       Intervention Provide education and explanation on how to use RPE scale       Expected Outcomes Short Term: Able to use RPE daily in rehab to express subjective intensity level;Long Term:  Able to use RPE to guide intensity level when exercising independently       Able to understand and use Dyspnea scale Yes       Intervention Provide education and  explanation on how to use Dyspnea scale       Expected Outcomes Short Term: Able to use Dyspnea scale daily in rehab to express subjective sense of shortness of breath during exertion;Long Term: Able to use Dyspnea scale to guide intensity level when exercising independently       Knowledge and understanding of Target Heart Rate Range (THRR) Yes       Intervention Provide education and explanation of THRR including how the numbers were predicted and where they are located for reference       Expected Outcomes Short Term: Able to state/look up THRR;Short Term: Able to use daily as guideline for intensity in rehab;Long Term: Able to use THRR to govern intensity when exercising independently       Able to check pulse independently Yes       Intervention Provide education and demonstration on how to check pulse in carotid and radial arteries.;Review the importance of being able to  check your own pulse for safety during independent exercise       Expected Outcomes Short Term: Able to explain why pulse checking is important during independent exercise;Long Term: Able to check pulse independently and accurately       Understanding of Exercise Prescription Yes       Intervention Provide education, explanation, and written materials on patient's individual exercise prescription       Expected Outcomes Short Term: Able to explain program exercise prescription;Long Term: Able to explain home exercise prescription to exercise independently                Copy of goals given to participant. Goals reviewed with patient; copy given to patient.

## 2021-02-16 NOTE — Progress Notes (Signed)
Cardiac Individual Treatment Plan  Patient Details  Name: Penny Mccullough MRN: [id number] Date of Birth: [birth year] Referring Provider:   Flowsheet Row Cardiac Rehab from 02/16/2021 in [facility] Cardiac and Pulmonary Rehab  Referring Provider Gollan       Initial Encounter Date:  Flowsheet Row Cardiac Rehab from 02/16/2021 in [facility] Cardiac and Pulmonary Rehab  Date 02/16/21       Visit Diagnosis: NSTEMI (non-ST elevation myocardial infarction) [facility])  Status post coronary artery stent placement  Patient's Home Medications on Admission:  Current Outpatient Medications:    albuterol (VENTOLIN HFA) 108 (90 Base) MCG/ACT inhaler, Inhale 2 puffs into the lungs every 6 (six) hours as needed for wheezing or shortness of breath., Disp: 8 g, Rfl: 0   apixaban (ELIQUIS) 5 MG TABS tablet, Take 1 tablet (5 mg total) by mouth 2 (two) times daily., Disp: 90 tablet, Rfl: 3   aspirin 81 MG EC tablet, Take 81 mg by mouth daily., Disp: , Rfl:    atorvastatin (LIPITOR) 80 MG tablet, Take 1 tablet (80 mg total) by mouth daily., Disp: 90 tablet, Rfl: 3   clopidogrel (PLAVIX) 75 MG tablet, Take 1 tablet (75 mg total) by mouth daily., Disp: 92 tablet, Rfl: 3   digoxin (LANOXIN) 0.125 MG tablet, Take 1 tablet (0.125 mg total) by mouth daily., Disp: 90 tablet, Rfl: 3   empagliflozin (JARDIANCE) 10 MG TABS tablet, Take by mouth daily., Disp: , Rfl:    furosemide (LASIX) 20 MG tablet, Take 2 tablets (40 mg total) by mouth daily., Disp: 180 tablet, Rfl: 3   metoprolol succinate (TOPROL XL) 50 MG 24 hr tablet, Take 1.5 tablets (75 mg total) by mouth daily., Disp: 140 tablet, Rfl: 3   nitroGLYCERIN (NITROSTAT) 0.4 MG SL tablet, Place 1 tablet (0.4 mg total) under the tongue every 5 (five) minutes as needed for chest pain., Disp: 25 tablet, Rfl: 2   potassium chloride (KLOR-CON) 10 MEQ tablet, Take 2 tablets (20 mEq total) by mouth daily as needed. Take when taking Lasix, Disp: 180 tablet, Rfl: 3  Past Medical  History: Past Medical History:  Diagnosis Date   CAD (coronary artery disease)    CHF (congestive heart failure) (HCC)    COPD (chronic obstructive pulmonary disease) (HCC)    CVA (cerebral vascular accident) (HCC)    HLD (hyperlipidemia)    Hypertension     Tobacco Use: Social History   Tobacco Use  Smoking Status Former   Packs/day: 1.00   Years: 30.00   Pack years: 30.00   Types: Cigarettes   Quit date: 02/21/1987   Years since quitting: 34.0  Smokeless Tobacco Never    Labs: Recent Review Flowsheet Data     Labs for ITP Cardiac and Pulmonary Rehab Latest Ref Rng & Units 10/27/2020 12/14/2020   Cholestrol 0 - 200 mg/dL 409(W) 119   LDLCALC 0 - 99 mg/dL 147(W) NOT CALCULATED    HDL >40 mg/dL 47 43   Trlycerides <295 mg/dL 621 308   Hemoglobin M5H 4.8 - 5.6 % 5.2 -        Exercise Target Goals: Exercise Program Goal: Individual exercise prescription set using results from initial 6 min walk test and THRR while considering  patients activity barriers and safety.   Exercise Prescription Goal: Initial exercise prescription builds to 30-45 minutes a day of aerobic activity, 2-3 days per week.  Home exercise guidelines will be given to patient during program as part of exercise prescription that the participant will acknowledge.  Education: Aerobic Exercise: - Group verbal and visual presentation on the components of exercise prescription. Introduces F.I.T.T principle from ACSM for exercise prescriptions.  Reviews F.I.T.T. principles of aerobic exercise including progression. Written material given at graduation.   Education: Resistance Exercise: - Group verbal and visual presentation on the components of exercise prescription. Introduces F.I.T.T principle from ACSM for exercise prescriptions  Reviews F.I.T.T. principles of resistance exercise including progression. Written material given at graduation.    Education: Exercise & Equipment Safety: - Individual verbal  instruction and demonstration of equipment use and safety with use of the equipment. Flowsheet Row Cardiac Rehab from 02/16/2021 in [facility] Cardiac and Pulmonary Rehab  Date 02/16/21  Educator AS  Instruction Review Code 1- Verbalizes Understanding       Education: Exercise Physiology & General Exercise Guidelines: - Group verbal and written instruction with models to review the exercise physiology of the cardiovascular system and associated critical values. Provides general exercise guidelines with specific guidelines to those with heart or lung disease.  Flowsheet Row Cardiac Rehab from 02/16/2021 in [facility] Cardiac and Pulmonary Rehab  Education need identified 02/16/21       Education: Flexibility, Balance, Mind/Body Relaxation: - Group verbal and visual presentation with interactive activity on the components of exercise prescription. Introduces F.I.T.T principle from ACSM for exercise prescriptions. Reviews F.I.T.T. principles of flexibility and balance exercise training including progression. Also discusses the mind body connection.  Reviews various relaxation techniques to help reduce and manage stress (i.e. Deep breathing, progressive muscle relaxation, and visualization). Balance handout provided to take home. Written material given at graduation.   Activity Barriers & Risk Stratification:   6 Minute Walk:  6 Minute Walk     Row Name 02/16/21 1511         6 Minute Walk   Phase Initial     Distance 1070 feet     Walk Time 6 minutes     # of Rest Breaks 0     MPH 2     METS 2.48     RPE 13     Perceived Dyspnea  2     VO2 Peak 8.67     Symptoms Yes (comment)     Comments SOB     Resting HR 105 bpm     Resting BP 122/72     Resting Oxygen Saturation  98 %     Exercise Oxygen Saturation  during 6 min walk 98 %     Max Ex. HR 119 bpm     Max Ex. BP 134/70     2 Minute Post BP 112/68              Oxygen Initial Assessment:   Oxygen Re-Evaluation:   Oxygen  Discharge (Final Oxygen Re-Evaluation):   Initial Exercise Prescription:  Initial Exercise Prescription - 02/16/21 1500       Date of Initial Exercise RX and Referring Provider   Date 02/16/21    Referring Provider Gollan      Oxygen   Maintain Oxygen Saturation 88% or higher      Treadmill   MPH 1.8    Grade 0.5    Minutes 15    METs 2.5      NuStep   Level 1    SPM 80    Minutes 15    METs 2.4      REL-XR   Level 1    Speed 50    Minutes 15    METs 2.4  Biostep-RELP   Level 1    SPM 50    Minutes 15    METs 2      Prescription Details   Frequency (times per week) 3    Duration Progress to 30 minutes of continuous aerobic without signs/symptoms of physical distress      Intensity   THRR 40-80% of Max Heartrate 121-138    Ratings of Perceived Exertion 11-13    Perceived Dyspnea 0-4      Resistance Training   Training Prescription Yes    Weight 3 lb    Reps 10-15             Perform Capillary Blood Glucose checks as needed.  Exercise Prescription Changes:   Exercise Prescription Changes     Row Name 02/16/21 1500             Response to Exercise   Blood Pressure (Admit) 122/72       Blood Pressure (Exercise) 134/70       Blood Pressure (Exit) 112/68       Heart Rate (Admit) 105 bpm       Heart Rate (Exercise) 119 bpm       Heart Rate (Exit) 108 bpm       Oxygen Saturation (Admit) 98 %       Oxygen Saturation (Exercise) 98 %       Rating of Perceived Exertion (Exercise) 13       Perceived Dyspnea (Exercise) 2       Symptoms SOB                Exercise Comments:   Exercise Goals and Review:   Exercise Goals     Row Name 02/16/21 1519             Exercise Goals   Increase Physical Activity Yes       Intervention Provide advice, education, support and counseling about physical activity/exercise needs.;Develop an individualized exercise prescription for aerobic and resistive training based on initial evaluation  findings, risk stratification, comorbidities and participant's personal goals.       Expected Outcomes Short Term: Attend rehab on a regular basis to increase amount of physical activity.;Long Term: Add in home exercise to make exercise part of routine and to increase amount of physical activity.;Long Term: Exercising regularly at least 3-5 days a week.       Increase Strength and Stamina Yes       Intervention Provide advice, education, support and counseling about physical activity/exercise needs.;Develop an individualized exercise prescription for aerobic and resistive training based on initial evaluation findings, risk stratification, comorbidities and participant's personal goals.       Expected Outcomes Short Term: Increase workloads from initial exercise prescription for resistance, speed, and METs.;Short Term: Perform resistance training exercises routinely during rehab and add in resistance training at home;Long Term: Improve cardiorespiratory fitness, muscular endurance and strength as measured by increased METs and functional capacity ( )       Able to understand and use rate of perceived exertion (RPE) scale Yes       Intervention Provide education and explanation on how to use RPE scale       Expected Outcomes Short Term: Able to use RPE daily in rehab to express subjective intensity level;Long Term:  Able to use RPE to guide intensity level when exercising independently       Able to understand and use Dyspnea scale Yes       Intervention Provide  education and explanation on how to use Dyspnea scale       Expected Outcomes Short Term: Able to use Dyspnea scale daily in rehab to express subjective sense of shortness of breath during exertion;Long Term: Able to use Dyspnea scale to guide intensity level when exercising independently       Knowledge and understanding of Target Heart Rate Range (THRR) Yes       Intervention Provide education and explanation of THRR including how the numbers  were predicted and where they are located for reference       Expected Outcomes Short Term: Able to state/look up THRR;Short Term: Able to use daily as guideline for intensity in rehab;Long Term: Able to use THRR to govern intensity when exercising independently       Able to check pulse independently Yes       Intervention Provide education and demonstration on how to check pulse in carotid and radial arteries.;Review the importance of being able to check your own pulse for safety during independent exercise       Expected Outcomes Short Term: Able to explain why pulse checking is important during independent exercise;Long Term: Able to check pulse independently and accurately       Understanding of Exercise Prescription Yes       Intervention Provide education, explanation, and written materials on patient's individual exercise prescription       Expected Outcomes Short Term: Able to explain program exercise prescription;Long Term: Able to explain home exercise prescription to exercise independently                Exercise Goals Re-Evaluation :   Discharge Exercise Prescription (Final Exercise Prescription Changes):  Exercise Prescription Changes - 02/16/21 1500       Response to Exercise   Blood Pressure (Admit) 122/72    Blood Pressure (Exercise) 134/70    Blood Pressure (Exit) 112/68    Heart Rate (Admit) 105 bpm    Heart Rate (Exercise) 119 bpm    Heart Rate (Exit) 108 bpm    Oxygen Saturation (Admit) 98 %    Oxygen Saturation (Exercise) 98 %    Rating of Perceived Exertion (Exercise) 13    Perceived Dyspnea (Exercise) 2    Symptoms SOB             Nutrition:  Target Goals: Understanding of nutrition guidelines, daily intake of sodium 1500mg , cholesterol 200mg , calories 30% from fat and 7% or less from saturated fats, daily to have 5 or more servings of fruits and vegetables.  Education: All About Nutrition: -Group instruction provided by verbal, written material,  interactive activities, discussions, models, and posters to present general guidelines for heart healthy nutrition including fat, fiber, MyPlate, the role of sodium in heart healthy nutrition, utilization of the nutrition label, and utilization of this knowledge for meal planning. Follow up email sent as well. Written material given at graduation. Flowsheet Row Cardiac Rehab from 02/16/2021 in [facility] Cardiac and Pulmonary Rehab  Education need identified 02/16/21       Biometrics:  Pre Biometrics - 02/16/21 1519       Pre Biometrics   Height 5' (1.524 m)    Weight 131 lb 14.4 oz (59.8 kg)    BMI (Calculated) 25.76    Single Leg Stand 6.33 seconds              Nutrition Therapy Plan and Nutrition Goals:   Nutrition Assessments:  MEDIFICTS Score Key: ?70 Need to make dietary changes  40-70 Heart Healthy Diet ? 40 Therapeutic Level Cholesterol Diet  Flowsheet Row Cardiac Rehab from 02/16/2021 in [facility] Cardiac and Pulmonary Rehab  Picture Your Plate Total Score on Admission 59      Picture Your Plate Scores: <16 Unhealthy dietary pattern with much room for improvement. 41-50 Dietary pattern unlikely to meet recommendations for good health and room for improvement. 51-60 More healthful dietary pattern, with some room for improvement.  >60 Healthy dietary pattern, although there may be some specific behaviors that could be improved.    Nutrition Goals Re-Evaluation:   Nutrition Goals Discharge (Final Nutrition Goals Re-Evaluation):   Psychosocial: Target Goals: Acknowledge presence or absence of significant depression and/or stress, maximize coping skills, provide positive support system. Participant is able to verbalize types and ability to use techniques and skills needed for reducing stress and depression.   Education: Stress, Anxiety, and Depression - Group verbal and visual presentation to define topics covered.  Reviews how body is impacted by stress, anxiety, and  depression.  Also discusses healthy ways to reduce stress and to treat/manage anxiety and depression.  Written material given at graduation.   Education: Sleep Hygiene -Provides group verbal and written instruction about how sleep can affect your health.  Define sleep hygiene, discuss sleep cycles and impact of sleep habits. Review good sleep hygiene tips.    Initial Review & Psychosocial Screening:  Initial Psych Review & Screening - 01/05/21 0958       Initial Review   Current issues with None Identified      Family Dynamics   Good Support System? Yes    Comments She can look to her daughter and sister for support. Patient has a positive outlook onher health.      Barriers   Psychosocial barriers to participate in program There are no identifiable barriers or psychosocial needs.;The patient should benefit from training in stress management and relaxation.      Screening Interventions   Interventions To provide support and resources with identified psychosocial needs;Encouraged to exercise;Provide feedback about the scores to participant    Expected Outcomes Short Term goal: Utilizing psychosocial counselor, staff and physician to assist with identification of specific Stressors or current issues interfering with healing process. Setting desired goal for each stressor or current issue identified.;Long Term Goal: Stressors or current issues are controlled or eliminated.;Short Term goal: Identification and review with participant of any Quality of Life or Depression concerns found by scoring the questionnaire.;Long Term goal: The participant improves quality of Life and PHQ9 Scores as seen by post scores and/or verbalization of changes             Quality of Life Scores:   Quality of Life - 02/16/21 1520       Quality of Life   Select Quality of Life      Quality of Life Scores   Health/Function Pre 24.04 %    Socioeconomic Pre 30 %    Psych/Spiritual Pre 25.71 %    Family  Pre 30 %    GLOBAL Pre 26.34 %            Scores of 19 and below usually indicate a poorer quality of life in these areas.  A difference of  2-3 points is a clinically meaningful difference.  A difference of 2-3 points in the total score of the Quality of Life Index has been associated with significant improvement in overall quality of life, self-image, physical symptoms, and general health in studies assessing change  in quality of life.  PHQ-9: Recent Review Flowsheet Data     Depression screen [facility] 2/9 02/16/2021 01/24/2021   Decreased Interest 1 0   Down, Depressed, Hopeless 1 1   PHQ - 2 Score 2 1   Trouble concentrating 1 -   Moving slowly or fidgety/restless 1 -   Difficult doing work/chores Not difficult at all -      Interpretation of Total Score  Total Score Depression Severity:  1-4 = Minimal depression, 5-9 = Mild depression, 10-14 = Moderate depression, 15-19 = Moderately severe depression, 20-27 = Severe depression   Psychosocial Evaluation and Intervention:  Psychosocial Evaluation - 01/05/21 1000       Psychosocial Evaluation & Interventions   Interventions Encouraged to exercise with the program and follow exercise prescription;Relaxation education;Stress management education    Comments She can look to her daughter and sister for support. Patient has a positive outlook onher health.    Expected Outcomes Short: Start HeartTrack to help with mood. Long: Maintain a healthy mental state    Continue Psychosocial Services  Follow up required by staff             Psychosocial Re-Evaluation:   Psychosocial Discharge (Final Psychosocial Re-Evaluation):   Vocational Rehabilitation: Provide vocational rehab assistance to qualifying candidates.   Vocational Rehab Evaluation & Intervention:   Education: Education Goals: Education classes will be provided on a variety of topics geared toward better understanding of heart health and risk factor modification.  Participant will state understanding/return demonstration of topics presented as noted by education test scores.  Learning Barriers/Preferences:  Learning Barriers/Preferences - 01/05/21 0957       Learning Barriers/Preferences   Learning Barriers Hearing    Learning Preferences None             General Cardiac Education Topics:  AED/CPR: - Group verbal and written instruction with the use of models to demonstrate the basic use of the AED with the basic ABC's of resuscitation.   Anatomy and Cardiac Procedures: - Group verbal and visual presentation and models provide information about basic cardiac anatomy and function. Reviews the testing methods done to diagnose heart disease and the outcomes of the test results. Describes the treatment choices: Medical Management, Angioplasty, or Coronary Bypass Surgery for treating various heart conditions including Myocardial Infarction, Angina, Valve Disease, and Cardiac Arrhythmias.  Written material given at graduation.   Medication Safety: - Group verbal and visual instruction to review commonly prescribed medications for heart and lung disease. Reviews the medication, class of the drug, and side effects. Includes the steps to properly store meds and maintain the prescription regimen.  Written material given at graduation.   Intimacy: - Group verbal instruction through game format to discuss how heart and lung disease can affect sexual intimacy. Written material given at graduation..   Know Your Numbers and Heart Failure: - Group verbal and visual instruction to discuss disease risk factors for cardiac and pulmonary disease and treatment options.  Reviews associated critical values for Overweight/Obesity, Hypertension, Cholesterol, and Diabetes.  Discusses basics of heart failure: signs/symptoms and treatments.  Introduces Heart Failure Zone chart for action plan for heart failure.  Written material given at graduation. Flowsheet Row  Cardiac Rehab from 02/16/2021 in [facility] Cardiac and Pulmonary Rehab  Education need identified 02/16/21       Infection Prevention: - Provides verbal and written material to individual with discussion of infection control including proper hand washing and proper equipment cleaning during exercise session. Flowsheet  Row Cardiac Rehab from 02/16/2021 in [facility] Cardiac and Pulmonary Rehab  Date 02/16/21  Educator AS  Instruction Review Code 1- Verbalizes Understanding       Falls Prevention: - Provides verbal and written material to individual with discussion of falls prevention and safety. Flowsheet Row Cardiac Rehab from 02/16/2021 in [facility] Cardiac and Pulmonary Rehab  Date 02/16/21  Educator AS  Instruction Review Code 1- Verbalizes Understanding       Other: -Provides group and verbal instruction on various topics (see comments)   Knowledge Questionnaire Score:   Core Components/Risk Factors/Patient Goals at Admission:  Personal Goals and Risk Factors at Admission - 02/16/21 1527       Core Components/Risk Factors/Patient Goals on Admission    Weight Management Yes;Weight Maintenance    Intervention Weight Management: Develop a combined nutrition and exercise program designed to reach desired caloric intake, while maintaining appropriate intake of nutrient and fiber, sodium and fats, and appropriate energy expenditure required for the weight goal.;Weight Management: Provide education and appropriate resources to help participant work on and attain dietary goals.;Weight Management/Obesity: Establish reasonable short term and long term weight goals.    Expected Outcomes Short Term: Continue to assess and modify interventions until short term weight is achieved;Long Term: Adherence to nutrition and physical activity/exercise program aimed toward attainment of established weight goal;Weight Maintenance: Understanding of the daily nutrition guidelines, which includes 25-35% calories  from fat, 7% or less cal from saturated fats, less than 200mg  cholesterol, less than 1.5gm of sodium, & 5 or more servings of fruits and vegetables daily;Understanding recommendations for meals to include 15-35% energy as protein, 25-35% energy from fat, 35-60% energy from carbohydrates, less than 200mg  of dietary cholesterol, 20-35 gm of total fiber daily;Understanding of distribution of calorie intake throughout the day with the consumption of 4-5 meals/snacks    Hypertension Yes    Intervention Provide education on lifestyle modifcations including regular physical activity/exercise, weight management, moderate sodium restriction and increased consumption of fresh fruit, vegetables, and low fat dairy, alcohol moderation, and smoking cessation.;Monitor prescription use compliance.    Expected Outcomes Short Term: Continued assessment and intervention until BP is < 140/62mm HG in hypertensive participants. < 130/24mm HG in hypertensive participants with diabetes, heart failure or chronic kidney disease.;Long Term: Maintenance of blood pressure at goal levels.    Lipids Yes    Intervention Provide education and support for participant on nutrition & aerobic/resistive exercise along with prescribed medications to achieve LDL 70mg , HDL >40mg .    Expected Outcomes Short Term: Participant states understanding of desired cholesterol values and is compliant with medications prescribed. Participant is following exercise prescription and nutrition guidelines.;Long Term: Cholesterol controlled with medications as prescribed, with individualized exercise RX and with personalized nutrition plan. Value goals: LDL < 70mg , HDL > 40 mg.             Education:Diabetes - Individual verbal and written instruction to review signs/symptoms of diabetes, desired ranges of glucose level fasting, after meals and with exercise. Acknowledge that pre and post exercise glucose checks will be done for 3 sessions at entry of  program.   Core Components/Risk Factors/Patient Goals Review:    Core Components/Risk Factors/Patient Goals at Discharge (Final Review):    ITP Comments:  ITP Comments     Row Name 01/05/21 0957           ITP Comments Virtual Visit completed. Patient informed on EP and RD appointment and 6 Minute walk test. Patient also informed of patient  health questionnaires on My Chart. Patient Verbalizes understanding. Visit diagnosis can be found in [facility] 10/27/2020.                Comments: initial ITP

## 2021-02-22 ENCOUNTER — Encounter: Payer: Medicare Other | Attending: Cardiovascular Disease

## 2021-02-22 ENCOUNTER — Other Ambulatory Visit: Payer: Self-pay

## 2021-02-22 DIAGNOSIS — Z87891 Personal history of nicotine dependence: Secondary | ICD-10-CM | POA: Insufficient documentation

## 2021-02-22 DIAGNOSIS — I214 Non-ST elevation (NSTEMI) myocardial infarction: Secondary | ICD-10-CM

## 2021-02-22 DIAGNOSIS — Z955 Presence of coronary angioplasty implant and graft: Secondary | ICD-10-CM | POA: Diagnosis not present

## 2021-02-22 NOTE — Progress Notes (Signed)
Daily Session Note  Patient Details  Name: Penny Mccullough MRN: [id number] Date of Birth: [birth year] Referring Provider:   Flowsheet Row Cardiac Rehab from 02/16/2021 in [facility] Cardiac and Pulmonary Rehab  Referring Provider Gollan       Encounter Date: 02/22/2021  Check In:  Session Check In - 02/22/21 1601       Check-In   Supervising physician immediately available to respond to emergencies See telemetry face sheet for immediately available ER MD    Location [facility]-Cardiac & Pulmonary Rehab    Staff Present Birdie Sons, MPA, RN;Joseph Lou Miner, MS, ASCM CEP, Exercise Physiologist    Virtual Visit No    Medication changes reported     No    Fall or balance concerns reported    No    Warm-up and Cool-down Performed on first and last piece of equipment    Resistance Training Performed Yes    VAD Patient? No    PAD/SET Patient? No      Pain Assessment   Currently in Pain? No/denies                Social History   Tobacco Use  Smoking Status Former   Packs/day: 1.00   Years: 30.00   Pack years: 30.00   Types: Cigarettes   Quit date: 02/21/1987   Years since quitting: 34.0  Smokeless Tobacco Never    Goals Met:  Independence with exercise equipment Exercise tolerated well No report of concerns or symptoms today Strength training completed today  Goals Unmet:  Not Applicable  Comments: First full day of exercise!  Patient was oriented to gym and equipment including functions, settings, policies, and procedures.  Patient's individual exercise prescription and treatment plan were reviewed.  All starting workloads were established based on the results of the 6 minute walk test done at initial orientation visit.  The plan for exercise progression was also introduced and progression will be customized based on patient's performance and goals.     Dr. Emily Filbert is Medical Director for [place].  Dr. Ottie Glazier  is Medical Director for [facility] Pulmonary Rehabilitation.

## 2021-02-23 ENCOUNTER — Encounter: Payer: Medicare Other | Admitting: *Deleted

## 2021-02-23 ENCOUNTER — Other Ambulatory Visit: Payer: Self-pay

## 2021-02-23 DIAGNOSIS — I214 Non-ST elevation (NSTEMI) myocardial infarction: Secondary | ICD-10-CM | POA: Diagnosis not present

## 2021-02-23 DIAGNOSIS — Z955 Presence of coronary angioplasty implant and graft: Secondary | ICD-10-CM

## 2021-02-23 NOTE — Progress Notes (Signed)
Daily Session Note  Patient Details  Name: Penny Mccullough MRN: [id number] Date of Birth: Aug 23, 1945 Referring Provider:   Flowsheet Row Cardiac Rehab from 02/16/2021 in [facility] Cardiac and Pulmonary Rehab  Referring Provider Gollan       Encounter Date: 02/23/2021  Check In:  Session Check In - 02/23/21 1546       Check-In   Supervising physician immediately available to respond to emergencies See telemetry face sheet for immediately available ER MD    Location [facility]-Cardiac & Pulmonary Rehab    Staff Present Renita Papa, RN BSN;Joseph Camden, RCP,RRT,BSRT;Jessica Daniel, [place], RCEP, CCRP, CCET    Virtual Visit No    Medication changes reported     No    Fall or balance concerns reported    No    Warm-up and Cool-down Performed on first and last piece of equipment    Resistance Training Performed Yes    VAD Patient? No    PAD/SET Patient? No      Pain Assessment   Currently in Pain? No/denies                Social History   Tobacco Use  Smoking Status Former   Packs/day: 1.00   Years: 30.00   Pack years: 30.00   Types: Cigarettes   Quit date: 02/21/1987   Years since quitting: 34.0  Smokeless Tobacco Never    Goals Met:  Independence with exercise equipment Exercise tolerated well No report of concerns or symptoms today Strength training completed today  Goals Unmet:  Not Applicable  Comments: Pt able to follow exercise prescription today without complaint.  Will continue to monitor for progression.    Dr. Emily Filbert is Medical Director for [place].  Dr. Ottie Glazier is Medical Director for [facility] Pulmonary Rehabilitation.

## 2021-02-27 ENCOUNTER — Other Ambulatory Visit: Payer: Self-pay

## 2021-02-27 DIAGNOSIS — Z955 Presence of coronary angioplasty implant and graft: Secondary | ICD-10-CM

## 2021-02-27 DIAGNOSIS — I214 Non-ST elevation (NSTEMI) myocardial infarction: Secondary | ICD-10-CM | POA: Diagnosis not present

## 2021-02-27 NOTE — Progress Notes (Signed)
Daily Session Note  Patient Details  Name: Penny Mccullough MRN: [id number] Date of Birth: Apr 03, 1945 Referring Provider:   Flowsheet Row Cardiac Rehab from 02/16/2021 in [facility] Cardiac and Pulmonary Rehab  Referring Provider Gollan       Encounter Date: 02/27/2021  Check In:  Session Check In - 02/27/21 1557       Check-In   Supervising physician immediately available to respond to emergencies See telemetry face sheet for immediately available ER MD    Location [facility]-Cardiac & Pulmonary Rehab    Staff Present Birdie Sons, MPA, RN;Joseph Lou Miner, MS, ASCM CEP, Exercise Physiologist    Virtual Visit No    Medication changes reported     No    Fall or balance concerns reported    No    Warm-up and Cool-down Performed on first and last piece of equipment    Resistance Training Performed Yes    VAD Patient? No    PAD/SET Patient? No      Pain Assessment   Currently in Pain? No/denies                Social History   Tobacco Use  Smoking Status Former   Packs/day: 1.00   Years: 30.00   Pack years: 30.00   Types: Cigarettes   Quit date: 02/21/1987   Years since quitting: 34.0  Smokeless Tobacco Never    Goals Met:  Independence with exercise equipment Exercise tolerated well No report of concerns or symptoms today Strength training completed today  Goals Unmet:  Not Applicable  Comments: Pt able to follow exercise prescription today without complaint.  Will continue to monitor for progression.    Dr. Emily Filbert is Medical Director for [place].  Dr. Ottie Glazier is Medical Director for [facility] Pulmonary Rehabilitation.

## 2021-02-28 ENCOUNTER — Other Ambulatory Visit: Payer: Self-pay

## 2021-02-28 DIAGNOSIS — I214 Non-ST elevation (NSTEMI) myocardial infarction: Secondary | ICD-10-CM | POA: Diagnosis not present

## 2021-02-28 DIAGNOSIS — Z955 Presence of coronary angioplasty implant and graft: Secondary | ICD-10-CM

## 2021-02-28 NOTE — Progress Notes (Signed)
Daily Session Note  Patient Details  Name: Penny Mccullough MRN: [id number] Date of Birth: 1945/05/07 Referring Provider:   Flowsheet Row Cardiac Rehab from 02/16/2021 in [facility] Cardiac and Pulmonary Rehab  Referring Provider Gollan       Encounter Date: 02/28/2021  Check In:  Session Check In - 02/28/21 2026       Check-In   Supervising physician immediately available to respond to emergencies See telemetry face sheet for immediately available ER MD    Location [facility]-Cardiac & Pulmonary Rehab    Staff Present Birdie Sons, MPA, RN;Amanda Oletta Darter, BA, ACSM CEP, Exercise Physiologist;Jessica Luan Pulling, MA, RCEP, CCRP, CCET    Virtual Visit No    Medication changes reported     No    Fall or balance concerns reported    No    Warm-up and Cool-down Performed on first and last piece of equipment    Resistance Training Performed Yes    VAD Patient? No    PAD/SET Patient? No      Pain Assessment   Currently in Pain? No/denies                Social History   Tobacco Use  Smoking Status Former   Packs/day: 1.00   Years: 30.00   Pack years: 30.00   Types: Cigarettes   Quit date: 02/21/1987   Years since quitting: 34.0  Smokeless Tobacco Never    Goals Met:  Independence with exercise equipment Exercise tolerated well No report of concerns or symptoms today Strength training completed today  Goals Unmet:  Not Applicable  Comments: Pt able to follow exercise prescription today without complaint.  Will continue to monitor for progression.    Dr. Emily Filbert is Medical Director for [place].  Dr. Ottie Glazier is Medical Director for [facility] Pulmonary Rehabilitation.

## 2021-03-01 ENCOUNTER — Other Ambulatory Visit: Payer: Self-pay

## 2021-03-01 DIAGNOSIS — Z955 Presence of coronary angioplasty implant and graft: Secondary | ICD-10-CM

## 2021-03-01 DIAGNOSIS — I214 Non-ST elevation (NSTEMI) myocardial infarction: Secondary | ICD-10-CM

## 2021-03-01 NOTE — Progress Notes (Signed)
Daily Session Note  Patient Details  Name: Penny Mccullough MRN: [id number] Date of Birth: 1945-05-19 Referring Provider:   Flowsheet Row Cardiac Rehab from 02/16/2021 in [facility] Cardiac and Pulmonary Rehab  Referring Provider Gollan       Encounter Date: 03/01/2021  Check In:  Session Check In - 03/01/21 1558       Check-In   Supervising physician immediately available to respond to emergencies See telemetry face sheet for immediately available ER MD    Location [facility]-Cardiac & Pulmonary Rehab    Staff Present Birdie Sons, MPA, Nino Glow, MS, ASCM CEP, Exercise Physiologist    Virtual Visit No    Medication changes reported     No    Fall or balance concerns reported    No    Warm-up and Cool-down Performed on first and last piece of equipment    Resistance Training Performed Yes    VAD Patient? No    PAD/SET Patient? No      Pain Assessment   Currently in Pain? No/denies                Social History   Tobacco Use  Smoking Status Former   Packs/day: 1.00   Years: 30.00   Pack years: 30.00   Types: Cigarettes   Quit date: 02/21/1987   Years since quitting: 34.0  Smokeless Tobacco Never    Goals Met:  Independence with exercise equipment Exercise tolerated well No report of concerns or symptoms today Strength training completed today  Goals Unmet:  Not Applicable  Comments: Pt able to follow exercise prescription today without complaint.  Will continue to monitor for progression.    Dr. Emily Filbert is Medical Director for [place].  Dr. Ottie Glazier is Medical Director for [facility] Pulmonary Rehabilitation.

## 2021-03-06 ENCOUNTER — Encounter: Payer: Medicare Other | Admitting: *Deleted

## 2021-03-06 ENCOUNTER — Other Ambulatory Visit: Payer: Self-pay

## 2021-03-06 DIAGNOSIS — I214 Non-ST elevation (NSTEMI) myocardial infarction: Secondary | ICD-10-CM

## 2021-03-06 DIAGNOSIS — Z955 Presence of coronary angioplasty implant and graft: Secondary | ICD-10-CM

## 2021-03-06 NOTE — Progress Notes (Signed)
Cardiology Office Note  Date:  03/07/2021   ID:  Penny Mccullough, DOB 01-19-1946, MRN [id number]  PCP:  Pcp, No   Chief Complaint  Patient presents with   1 month follow up     "Doing well." Medications reviewed by the patient verbally.     HPI:  Penny Mccullough is a 76 y.o. female with a hx of  CAD  remote CABG approximately 30 years ago (suspected to be a LIMA-LAD per history),  CVA,  HLD,  COPD /tobacco /chronic bronchitis Ejection fraction 45 to 50% Moderate MR, severely dilated left atrium NSTEMI s/p PCI to ostial and mid LAD 10/28/20.  Hard of hearing Who presents for follow-up of her coronary disease,  PCI to ostial and mid LAD, new atrial fibrillation (diagnosed December 2022)  Last seen by myself in clinic February 06, 2021 Noted to be in atrial fibrillation at that time, rate 119 bpm, asymptomatic Entresto was held, started on low-dose digoxin, metoprolol succinate increased, Entresto held, Lasix 40 continued  Previously reported general malaise on Jardiance, did not feel better after holding the medication, felt better after prednisone course, CHF clinic restarted Jardiance  Previously reported high sweet tea intake  Feeling better, breathing feels better Attributes this to stopping Entresto but also discussed with her that rate controlling medications were also added including digoxin, higher dose metoprolol she has been on Lasix now for several more weeks  Vitals showing tachycardia rates 90-130, periodic low blood pressure   EKG personally reviewed by myself on todays visit Atrial fibrillation ventricular rate 98 bpm PVC  Other past medical history reviewed September 2022 NSTEMI s/p PCI to ostial and mid LAD 10/28/20.   Echo showed EF 45-50% with HK of basal to mid inferior and inferolateral wall.   remote CABG x 1, at which time she was told "they took a main artery from my chest," and appears to be a LIMA to LAD.     PMH:   has a past medical history of CAD  (coronary artery disease), CHF (congestive heart failure) (HCC), COPD (chronic obstructive pulmonary disease) (HCC), CVA (cerebral vascular accident) (HCC), HLD (hyperlipidemia), and Hypertension.  PSH:    Past Surgical History:  Procedure Laterality Date   CORONARY STENT INTERVENTION N/A 10/28/2020   Procedure: CORONARY STENT INTERVENTION;  Surgeon: Alwyn Pea, MD;  Location: [facility] INVASIVE CV LAB;  Service: Cardiovascular;  Laterality: N/A;   LEFT HEART CATH AND CORONARY ANGIOGRAPHY N/A 10/28/2020   Procedure: LEFT HEART CATH AND CORONARY ANGIOGRAPHY;  Surgeon: Alwyn Pea, MD;  Location: [facility] INVASIVE CV LAB;  Service: Cardiovascular;  Laterality: N/A;    Current Outpatient Medications  Medication Sig Dispense Refill   albuterol (VENTOLIN HFA) 108 (90 Base) MCG/ACT inhaler Inhale 2 puffs into the lungs every 6 (six) hours as needed for wheezing or shortness of breath. 8 g 0   apixaban (ELIQUIS) 5 MG TABS tablet Take 1 tablet (5 mg total) by mouth 2 (two) times daily. 90 tablet 3   atorvastatin (LIPITOR) 80 MG tablet Take 1 tablet (80 mg total) by mouth daily. 90 tablet 3   clopidogrel (PLAVIX) 75 MG tablet Take 1 tablet (75 mg total) by mouth daily. 92 tablet 3   digoxin (LANOXIN) 0.125 MG tablet Take 1 tablet (0.125 mg total) by mouth daily. 90 tablet 3   empagliflozin (JARDIANCE) 10 MG TABS tablet Take by mouth daily.     furosemide (LASIX) 20 MG tablet Take 2 tablets (40 mg total) by mouth daily.  180 tablet 3   metoprolol succinate (TOPROL XL) 50 MG 24 hr tablet Take 1.5 tablets (75 mg total) by mouth daily. 140 tablet 3   nitroGLYCERIN (NITROSTAT) 0.4 MG SL tablet Place 1 tablet (0.4 mg total) under the tongue every 5 (five) minutes as needed for chest pain. 25 tablet 2   potassium chloride (KLOR-CON) 10 MEQ tablet Take 2 tablets (20 mEq total) by mouth daily as needed. Take when taking Lasix 180 tablet 3   No current facility-administered medications for this visit.      Allergies:   Codeine   Social History:  The patient  reports that she quit smoking about 34 years ago. Her smoking use included cigarettes. She has a 30.00 pack-year smoking history. She has never used smokeless tobacco. She reports that she does not currently use alcohol. She reports that she does not use drugs.   Family History:   family history includes Hypertension in her mother.    Review of Systems: Review of Systems  Constitutional: Negative.   HENT: Negative.    Respiratory:  Positive for wheezing.   Cardiovascular: Negative.   Gastrointestinal: Negative.   Musculoskeletal: Negative.   Neurological: Negative.   Psychiatric/Behavioral: Negative.    All other systems reviewed and are negative.   PHYSICAL EXAM: VS:  BP 122/86 (BP Location: Left Arm, Patient Position: Sitting, Cuff Size: Normal)    Pulse 98    Ht 5\' 1"  (1.549 m)    Wt 128 lb (58.1 kg)    SpO2 96%    BMI 24.19 kg/m  , BMI Body mass index is 24.19 kg/m. GEN: Well nourished, well developed, in no acute distress HEENT: normal Neck: no JVD, carotid bruits, or masses Cardiac: Irregularly irregular; no murmurs, rubs, or gallops,no edema  Respiratory:  clear to auscultation bilaterally, normal work of breathing, scattered wheeze GI: soft, nontender, nondistended, + BS MS: no deformity or atrophy Skin: warm and dry, no rash Neuro:  Strength and sensation are intact Psych: euthymic mood, full affect  Recent Labs: 12/14/2020: ALT 16 01/22/2021: B Natriuretic Peptide 1,905.6; Hemoglobin 13.2; Magnesium 2.2; Platelets 444 01/31/2021: BUN 20; Creatinine, Ser 1.02; Potassium 3.8; Sodium 138    Lipid Panel Lab Results  Component Value Date   CHOL 144 12/14/2020   HDL 43 12/14/2020   LDLCALC NOT CALCULATED 12/14/2020   TRIG 126 12/14/2020      Wt Readings from Last 3 Encounters:  03/07/21 128 lb (58.1 kg)  02/16/21 131 lb 14.4 oz (59.8 kg)  02/06/21 125 lb 4 oz (56.8 kg)     ASSESSMENT AND  PLAN:  Problem List Items Addressed This Visit       Cardiology Problems   CAD (coronary artery disease) - Primary   Relevant Orders   EKG 12-Lead     Other   COPD (chronic obstructive pulmonary disease) (HCC)   Other Visit Diagnoses     Paroxysmal atrial fibrillation (HCC)       Relevant Orders   EKG 12-Lead   Chronic systolic heart failure (HCC)       Essential hypertension         Coronary disease with stable angina Prior PCI to ostial and mid LAD Recommend she continue Plavix, stop aspirin Stay on Eliquis On statin, goal LDL less than 70  Atrial fibrillation with RVR Appear to start December 2022 Has had better rate control and symptoms improved with higher dose metoprolol succinate, digoxin, Lasix 40 daily BMP today Reports she is taking her  Eliquis 5 twice daily °We will plan for cardioversion when daughter gets back into town °Risk and benefits of procedure discussed with her in detail °Long discussion concerning various treatment options for her atrial fibrillation ° ° Total encounter time more than 35 minutes ° Greater than 50% was spent in counseling and coordination of care with the patient ° ° ° °Signed, °Tim Bridget [place], M.D., Ph.D. °[facility], [place] °[phone] ° °

## 2021-03-06 NOTE — H&P (View-Only) (Signed)
Cardiology Office Note  Date:  03/07/2021   ID:  Penny Mccullough, DOB 01-19-1946, MRN [id number]  PCP:  Pcp, No   Chief Complaint  Patient presents with   1 month follow up     "Doing well." Medications reviewed by the patient verbally.     HPI:  Penny Mccullough is a 76 y.o. female with a hx of  CAD  remote CABG approximately 30 years ago (suspected to be a LIMA-LAD per history),  CVA,  HLD,  COPD /tobacco /chronic bronchitis Ejection fraction 45 to 50% Moderate MR, severely dilated left atrium NSTEMI s/p PCI to ostial and mid LAD 10/28/20.  Hard of hearing Who presents for follow-up of her coronary disease,  PCI to ostial and mid LAD, new atrial fibrillation (diagnosed December 2022)  Last seen by myself in clinic February 06, 2021 Noted to be in atrial fibrillation at that time, rate 119 bpm, asymptomatic Entresto was held, started on low-dose digoxin, metoprolol succinate increased, Entresto held, Lasix 40 continued  Previously reported general malaise on Jardiance, did not feel better after holding the medication, felt better after prednisone course, CHF clinic restarted Jardiance  Previously reported high sweet tea intake  Feeling better, breathing feels better Attributes this to stopping Entresto but also discussed with her that rate controlling medications were also added including digoxin, higher dose metoprolol she has been on Lasix now for several more weeks  Vitals showing tachycardia rates 90-130, periodic low blood pressure   EKG personally reviewed by myself on todays visit Atrial fibrillation ventricular rate 98 bpm PVC  Other past medical history reviewed September 2022 NSTEMI s/p PCI to ostial and mid LAD 10/28/20.   Echo showed EF 45-50% with HK of basal to mid inferior and inferolateral wall.   remote CABG x 1, at which time she was told "they took a main artery from my chest," and appears to be a LIMA to LAD.     PMH:   has a past medical history of CAD  (coronary artery disease), CHF (congestive heart failure) (HCC), COPD (chronic obstructive pulmonary disease) (HCC), CVA (cerebral vascular accident) (HCC), HLD (hyperlipidemia), and Hypertension.  PSH:    Past Surgical History:  Procedure Laterality Date   CORONARY STENT INTERVENTION N/A 10/28/2020   Procedure: CORONARY STENT INTERVENTION;  Surgeon: Alwyn Pea, MD;  Location: [facility] INVASIVE CV LAB;  Service: Cardiovascular;  Laterality: N/A;   LEFT HEART CATH AND CORONARY ANGIOGRAPHY N/A 10/28/2020   Procedure: LEFT HEART CATH AND CORONARY ANGIOGRAPHY;  Surgeon: Alwyn Pea, MD;  Location: [facility] INVASIVE CV LAB;  Service: Cardiovascular;  Laterality: N/A;    Current Outpatient Medications  Medication Sig Dispense Refill   albuterol (VENTOLIN HFA) 108 (90 Base) MCG/ACT inhaler Inhale 2 puffs into the lungs every 6 (six) hours as needed for wheezing or shortness of breath. 8 g 0   apixaban (ELIQUIS) 5 MG TABS tablet Take 1 tablet (5 mg total) by mouth 2 (two) times daily. 90 tablet 3   atorvastatin (LIPITOR) 80 MG tablet Take 1 tablet (80 mg total) by mouth daily. 90 tablet 3   clopidogrel (PLAVIX) 75 MG tablet Take 1 tablet (75 mg total) by mouth daily. 92 tablet 3   digoxin (LANOXIN) 0.125 MG tablet Take 1 tablet (0.125 mg total) by mouth daily. 90 tablet 3   empagliflozin (JARDIANCE) 10 MG TABS tablet Take by mouth daily.     furosemide (LASIX) 20 MG tablet Take 2 tablets (40 mg total) by mouth daily.  180 tablet 3   metoprolol succinate (TOPROL XL) 50 MG 24 hr tablet Take 1.5 tablets (75 mg total) by mouth daily. 140 tablet 3   nitroGLYCERIN (NITROSTAT) 0.4 MG SL tablet Place 1 tablet (0.4 mg total) under the tongue every 5 (five) minutes as needed for chest pain. 25 tablet 2   potassium chloride (KLOR-CON) 10 MEQ tablet Take 2 tablets (20 mEq total) by mouth daily as needed. Take when taking Lasix 180 tablet 3   No current facility-administered medications for this visit.      Allergies:   Codeine   Social History:  The patient  reports that she quit smoking about 34 years ago. Her smoking use included cigarettes. She has a 30.00 pack-year smoking history. She has never used smokeless tobacco. She reports that she does not currently use alcohol. She reports that she does not use drugs.   Family History:   family history includes Hypertension in her mother.    Review of Systems: Review of Systems  Constitutional: Negative.   HENT: Negative.    Respiratory:  Positive for wheezing.   Cardiovascular: Negative.   Gastrointestinal: Negative.   Musculoskeletal: Negative.   Neurological: Negative.   Psychiatric/Behavioral: Negative.    All other systems reviewed and are negative.   PHYSICAL EXAM: VS:  BP 122/86 (BP Location: Left Arm, Patient Position: Sitting, Cuff Size: Normal)    Pulse 98    Ht 5\' 1"  (1.549 m)    Wt 128 lb (58.1 kg)    SpO2 96%    BMI 24.19 kg/m  , BMI Body mass index is 24.19 kg/m. GEN: Well nourished, well developed, in no acute distress HEENT: normal Neck: no JVD, carotid bruits, or masses Cardiac: Irregularly irregular; no murmurs, rubs, or gallops,no edema  Respiratory:  clear to auscultation bilaterally, normal work of breathing, scattered wheeze GI: soft, nontender, nondistended, + BS MS: no deformity or atrophy Skin: warm and dry, no rash Neuro:  Strength and sensation are intact Psych: euthymic mood, full affect  Recent Labs: 12/14/2020: ALT 16 01/22/2021: B Natriuretic Peptide 1,905.6; Hemoglobin 13.2; Magnesium 2.2; Platelets 444 01/31/2021: BUN 20; Creatinine, Ser 1.02; Potassium 3.8; Sodium 138    Lipid Panel Lab Results  Component Value Date   CHOL 144 12/14/2020   HDL 43 12/14/2020   LDLCALC NOT CALCULATED 12/14/2020   TRIG 126 12/14/2020      Wt Readings from Last 3 Encounters:  03/07/21 128 lb (58.1 kg)  02/16/21 131 lb 14.4 oz (59.8 kg)  02/06/21 125 lb 4 oz (56.8 kg)     ASSESSMENT AND  PLAN:  Problem List Items Addressed This Visit       Cardiology Problems   CAD (coronary artery disease) - Primary   Relevant Orders   EKG 12-Lead     Other   COPD (chronic obstructive pulmonary disease) (HCC)   Other Visit Diagnoses     Paroxysmal atrial fibrillation (HCC)       Relevant Orders   EKG 12-Lead   Chronic systolic heart failure (HCC)       Essential hypertension         Coronary disease with stable angina Prior PCI to ostial and mid LAD Recommend she continue Plavix, stop aspirin Stay on Eliquis On statin, goal LDL less than 70  Atrial fibrillation with RVR Appear to start December 2022 Has had better rate control and symptoms improved with higher dose metoprolol succinate, digoxin, Lasix 40 daily BMP today Reports she is taking her  Eliquis 5 twice daily We will plan for cardioversion when daughter gets back into town Risk and benefits of procedure discussed with her in detail Long discussion concerning various treatment options for her atrial fibrillation   Total encounter time more than 35 minutes  Greater than 50% was spent in counseling and coordination of care with the patient    Signed, Dossie Arbour, M.D., Ph.D. [facility] [place], [place] [phone]

## 2021-03-06 NOTE — Progress Notes (Signed)
Daily Session Note  Patient Details  Name: Brealyn Baril MRN: [id number] Date of Birth: [birth year] Referring Provider:   Flowsheet Row Cardiac Rehab from 02/16/2021 in [facility] Cardiac and Pulmonary Rehab  Referring Provider Gollan       Encounter Date: 03/06/2021  Check In:  Session Check In - 03/06/21 1619       Check-In   Supervising physician immediately available to respond to emergencies See telemetry face sheet for immediately available ER MD    Location [facility]-Cardiac & Pulmonary Rehab    Staff Present Nyoka Cowden, RN, BSN, Tyna Jaksch, MS, ASCM CEP, Exercise Physiologist;Joseph Tessie Fass, [place]    Virtual Visit No    Medication changes reported     No    Tobacco Cessation No Change    Warm-up and Cool-down Performed on first and last piece of equipment    Resistance Training Performed Yes    PAD/SET Patient? No      Pain Assessment   Currently in Pain? No/denies                Social History   Tobacco Use  Smoking Status Former   Packs/day: 1.00   Years: 30.00   Pack years: 30.00   Types: Cigarettes   Quit date: 02/21/1987   Years since quitting: 34.0  Smokeless Tobacco Never    Goals Met:  Independence with exercise equipment Exercise tolerated well No report of concerns or symptoms today  Goals Unmet:  Not Applicable  Comments: .exgoo   Dr. Emily Filbert is Medical Director for [place].  Dr. Ottie Glazier is Medical Director for [facility] Pulmonary Rehabilitation.

## 2021-03-07 ENCOUNTER — Encounter: Payer: Self-pay | Admitting: Cardiovascular Disease

## 2021-03-07 ENCOUNTER — Other Ambulatory Visit: Payer: Self-pay

## 2021-03-07 ENCOUNTER — Ambulatory Visit (INDEPENDENT_AMBULATORY_CARE_PROVIDER_SITE_OTHER): Payer: Commercial Managed Care - HMO | Admitting: Cardiovascular Disease

## 2021-03-07 VITALS — BP 122/86 | HR 98 | Ht 61.0 in | Wt 128.0 lb

## 2021-03-07 DIAGNOSIS — Z01810 Encounter for preprocedural cardiovascular examination: Secondary | ICD-10-CM

## 2021-03-07 DIAGNOSIS — I5022 Chronic systolic (congestive) heart failure: Secondary | ICD-10-CM

## 2021-03-07 DIAGNOSIS — I10 Essential (primary) hypertension: Secondary | ICD-10-CM

## 2021-03-07 DIAGNOSIS — I25118 Atherosclerotic heart disease of native coronary artery with other forms of angina pectoris: Secondary | ICD-10-CM | POA: Diagnosis not present

## 2021-03-07 DIAGNOSIS — I480 Paroxysmal atrial fibrillation: Secondary | ICD-10-CM | POA: Diagnosis not present

## 2021-03-07 DIAGNOSIS — I499 Cardiac arrhythmia, unspecified: Secondary | ICD-10-CM

## 2021-03-07 DIAGNOSIS — Z01818 Encounter for other preprocedural examination: Secondary | ICD-10-CM | POA: Diagnosis not present

## 2021-03-07 DIAGNOSIS — J449 Chronic obstructive pulmonary disease, unspecified: Secondary | ICD-10-CM

## 2021-03-07 NOTE — Patient Instructions (Addendum)
Medication Instructions:  Please STOP  aspirin  If you need a refill on your cardiac medications before your next appointment, please call your pharmacy.   Lab work: [employer] & CBC  Testing/Procedures: Cardioversion We will call you daughter to have this arrange with date and time  Follow-Up: At [facility], you and your health needs are our priority.  As part of our continuing mission to provide you with exceptional heart care, we have created designated Provider Care Teams.  These Care Teams include your primary Cardiologist (physician) and Advanced Practice Providers (APPs -  Physician Assistants and Nurse Practitioners) who all work together to provide you with the care you need, when you need it.  You will need a follow up appointment in 1 month  Providers on your designated Care Team:   Nicolasa Ducking, NP Eula Listen, PA-C Cadence Fransico Michael, [place]  COVID-19 Vaccine Information can be found at: [email or web address] For questions related to vaccine distribution or appointments, please email [email or web address] or call [phone].    Cardioversion  Please arrive at the [facility] [place] [facility], free valet parking is available  [address] [place], [place], [place] [address]  INSTRUCTIONS:   Labs: CBC & BMP (within 30 days)  Nothing to eat or drink after midnight  May take your night time medications with a small sip of  water                  Medications:  HOLD: Lasix (may take after procedure) YOU MAY TAKE ALL of your remaining medications with a small amount of water. Continue your anticoagulant: Eliquis & Plavix You will need to continue your anticoagulant after your procedure until you are told by your provider that it is safe to stop  Must have a responsible person to drive you home. They must stay in the waiting area during your procedure.  Failure to do so could result in cancellation.  Bring a current list of  your medications and current insurance cards.   If you have any questions after you get home, please call the office at [phone]  FYI: For your safety, and to allow [place] to monitor your vital signs accurately during the surgery/procedure we request that  if you have artificial nails, gel coating, SNS etc. Please have those removed prior to your surgery/procedure. Not having the nail coverings /polish removed may result in cancellation or delay of your surgery/procedure.

## 2021-03-08 ENCOUNTER — Ambulatory Visit: Payer: Medicare HMO | Admitting: Family

## 2021-03-08 ENCOUNTER — Telehealth: Payer: Self-pay

## 2021-03-08 ENCOUNTER — Encounter: Payer: Medicare Other | Admitting: *Deleted

## 2021-03-08 DIAGNOSIS — Z955 Presence of coronary angioplasty implant and graft: Secondary | ICD-10-CM

## 2021-03-08 DIAGNOSIS — I214 Non-ST elevation (NSTEMI) myocardial infarction: Secondary | ICD-10-CM

## 2021-03-08 LAB — BASIC METABOLIC PANEL
BUN/Creatinine Ratio: 16 (ref 12–28)
BUN: 15 mg/dL (ref 8–27)
CO2: 22 mmol/L (ref 20–29)
Calcium: 9.5 mg/dL (ref 8.7–10.3)
Chloride: 100 mmol/L (ref 96–106)
Creatinine, Ser: 0.94 mg/dL (ref 0.57–1.00)
Glucose: 114 mg/dL — ABNORMAL HIGH (ref 70–99)
Potassium: 4.4 mmol/L (ref 3.5–5.2)
Sodium: 138 mmol/L (ref 134–144)
eGFR: 64 mL/min/{1.73_m2} (ref >59)

## 2021-03-08 LAB — CBC
Hematocrit: 41.5 % (ref 34.0–46.6)
Hemoglobin: 13.5 g/dL (ref 11.1–15.9)
MCH: 29 pg (ref 26.6–33.0)
MCHC: 32.5 g/dL (ref 31.5–35.7)
MCV: 89 fL (ref 79–97)
Platelets: 383 10*3/uL (ref 150–450)
RBC: 4.66 x10E6/uL (ref 3.77–5.28)
RDW: 13.6 % (ref 11.7–15.4)
WBC: 7.5 10*3/uL (ref 3.4–10.8)

## 2021-03-08 NOTE — Telephone Encounter (Signed)
S/p with pt via phone to f/u on scheduling a cardioversion after her recent OV with Dr. Mariah Milling. Pt needed to speak with daughter first to conform her scheduling in order to have procedure schedule   Able to confirm date and time. Instructions reviewed with pt, verbalized understanding, will also mail a hard copy for pt's review.   Otherwise all questions were address and no additional concerns at this time. Mrs. Hipolito thankful for following-up with plan procedure. Will call back for anything further questions or concerns.     Cardioversion  Date: 03/16/2021  Arrive at 06:30 am   Start time 07:30 am with Dr. Mariah Milling  Please arrive at the [facility] [place] [facility], free valet parking is available  [address] [place], [place], [place] [address]  INSTRUCTIONS:   Labs: CBC & BMP (within 30 days) Already completed  Nothing to eat or drink after midnight  May take your night time medications with a small sip of  water                  Medications:  HOLD: Lasix May take after your procedure of the following day YOU MAY TAKE ALL of your remaining medications with a small amount of water. Continue your anticoagulant: Eliquis & Plavix (may take morning of procedure) You will need to continue your anticoagulant after your procedure until you are told by your provider that it is safe to stop  Must have a responsible person to drive you home. They must stay in the waiting area during your procedure.  Failure to do so could result in cancellation.  Bring a current list of your medications and current insurance cards.   If you have any questions after you get home, please call the office at [phone]  FYI: For your safety, and to allow [place] to monitor your vital signs accurately during the surgery/procedure we request that  if you have artificial nails, gel coating, SNS etc. Please have those removed prior to your surgery/procedure. Not having the nail coverings /polish removed may result in  cancellation or delay of your surgery/procedure.

## 2021-03-08 NOTE — Progress Notes (Signed)
Daily Session Note  Patient Details  Name: Penny Mccullough MRN: [id number] Date of Birth: [birth year] Referring Provider:   Flowsheet Row Cardiac Rehab from 02/16/2021 in [facility] Cardiac and Pulmonary Rehab  Referring Provider Gollan       Encounter Date: 03/08/2021  Check In:  Session Check In - 03/08/21 1603       Check-In   Supervising physician immediately available to respond to emergencies See telemetry face sheet for immediately available ER MD    Staff Present Renita Papa, RN BSN;Joseph Tessie Fass, RCP,RRT,BSRT;Kara [place], MS, ASCM CEP, Exercise Physiologist    Virtual Visit No    Medication changes reported     No    Fall or balance concerns reported    No    Warm-up and Cool-down Performed on first and last piece of equipment    Resistance Training Performed Yes    VAD Patient? No    PAD/SET Patient? No      Pain Assessment   Currently in Pain? No/denies                Social History   Tobacco Use  Smoking Status Former   Packs/day: 1.00   Years: 30.00   Pack years: 30.00   Types: Cigarettes   Quit date: 02/21/1987   Years since quitting: 34.0  Smokeless Tobacco Never    Goals Met:  Independence with exercise equipment Exercise tolerated well No report of concerns or symptoms today Strength training completed today  Goals Unmet:  Not Applicable  Comments: Pt able to follow exercise prescription today without complaint.  Will continue to monitor for progression.    Dr. Emily Filbert is Medical Director for [place].  Dr. Ottie Glazier is Medical Director for [facility] Pulmonary Rehabilitation.

## 2021-03-09 ENCOUNTER — Encounter: Payer: Medicare Other | Admitting: *Deleted

## 2021-03-09 ENCOUNTER — Other Ambulatory Visit: Payer: Self-pay

## 2021-03-09 DIAGNOSIS — I214 Non-ST elevation (NSTEMI) myocardial infarction: Secondary | ICD-10-CM | POA: Diagnosis not present

## 2021-03-09 DIAGNOSIS — Z955 Presence of coronary angioplasty implant and graft: Secondary | ICD-10-CM

## 2021-03-09 NOTE — Progress Notes (Signed)
Daily Session Note  Patient Details  Name: Penny Mccullough MRN: [id number] Date of Birth: 1945-07-01 Referring Provider:   Flowsheet Row Cardiac Rehab from 02/16/2021 in [facility] Cardiac and Pulmonary Rehab  Referring Provider Gollan       Encounter Date: 03/09/2021  Check In:  Session Check In - 03/09/21 1604       Check-In   Supervising physician immediately available to respond to emergencies See telemetry face sheet for immediately available ER MD    Location [facility]-Cardiac & Pulmonary Rehab    Staff Present Renita Papa, RN BSN;Joseph Tessie Fass, [place]    Virtual Visit No    Medication changes reported     No    Fall or balance concerns reported    No    Warm-up and Cool-down Performed on first and last piece of equipment    Resistance Training Performed Yes    VAD Patient? No    PAD/SET Patient? No      Pain Assessment   Currently in Pain? No/denies                Social History   Tobacco Use  Smoking Status Former   Packs/day: 1.00   Years: 30.00   Pack years: 30.00   Types: Cigarettes   Quit date: 02/21/1987   Years since quitting: 34.0  Smokeless Tobacco Never    Goals Met:  Independence with exercise equipment Exercise tolerated well No report of concerns or symptoms today Strength training completed today  Goals Unmet:  Not Applicable  Comments: Pt able to follow exercise prescription today without complaint.  Will continue to monitor for progression.    Dr. Emily Filbert is Medical Director for [place].  Dr. Ottie Glazier is Medical Director for [facility] Pulmonary Rehabilitation.

## 2021-03-13 ENCOUNTER — Other Ambulatory Visit: Payer: Self-pay

## 2021-03-13 DIAGNOSIS — I214 Non-ST elevation (NSTEMI) myocardial infarction: Secondary | ICD-10-CM | POA: Diagnosis not present

## 2021-03-13 DIAGNOSIS — Z955 Presence of coronary angioplasty implant and graft: Secondary | ICD-10-CM

## 2021-03-13 NOTE — Progress Notes (Signed)
Daily Session Note  Patient Details  Name: Penny Mccullough MRN: [id number] Date of Birth: [birth year] Referring Provider:   Flowsheet Row Cardiac Rehab from 02/16/2021 in [facility] Cardiac and Pulmonary Rehab  Referring Provider Gollan       Encounter Date: 03/13/2021  Check In:  Session Check In - 03/13/21 1608       Check-In   Supervising physician immediately available to respond to emergencies See telemetry face sheet for immediately available ER MD    Location [facility]-Cardiac & Pulmonary Rehab    Staff Present Birdie Sons, MPA, RN;Joseph Garnette Czech [place], MS, ASCM CEP, Exercise Physiologist    Virtual Visit No    Medication changes reported     No    Fall or balance concerns reported    No    Tobacco Cessation No Change    Warm-up and Cool-down Performed on first and last piece of equipment    Resistance Training Performed Yes    VAD Patient? No    PAD/SET Patient? No      Pain Assessment   Currently in Pain? No/denies                Social History   Tobacco Use  Smoking Status Former   Packs/day: 1.00   Years: 30.00   Pack years: 30.00   Types: Cigarettes   Quit date: 02/21/1987   Years since quitting: 34.0  Smokeless Tobacco Never    Goals Met:  Independence with exercise equipment Exercise tolerated well No report of concerns or symptoms today Strength training completed today  Goals Unmet:  Not Applicable  Comments: Pt able to follow exercise prescription today without complaint.  Will continue to monitor for progression.    Dr. Emily Filbert is Medical Director for [place].  Dr. Ottie Glazier is Medical Director for [facility] Pulmonary Rehabilitation.

## 2021-03-15 ENCOUNTER — Encounter: Payer: Self-pay | Admitting: *Deleted

## 2021-03-15 ENCOUNTER — Other Ambulatory Visit: Payer: Self-pay | Admitting: Cardiovascular Disease

## 2021-03-15 ENCOUNTER — Other Ambulatory Visit: Payer: Self-pay

## 2021-03-15 DIAGNOSIS — I4819 Other persistent atrial fibrillation: Secondary | ICD-10-CM

## 2021-03-15 DIAGNOSIS — I214 Non-ST elevation (NSTEMI) myocardial infarction: Secondary | ICD-10-CM | POA: Diagnosis not present

## 2021-03-15 DIAGNOSIS — Z955 Presence of coronary angioplasty implant and graft: Secondary | ICD-10-CM

## 2021-03-15 NOTE — Progress Notes (Signed)
Cardiac Individual Treatment Plan  Patient Details  Name: Penny Mccullough MRN: [id number] Date of Birth: [birth year] Referring Provider:   Flowsheet Row Cardiac Rehab from 02/16/2021 in [facility] Cardiac and Pulmonary Rehab  Referring Provider Gollan       Initial Encounter Date:  Flowsheet Row Cardiac Rehab from 02/16/2021 in [facility] Cardiac and Pulmonary Rehab  Date 02/16/21       Visit Diagnosis: NSTEMI (non-ST elevation myocardial infarction) [facility])  Patient's Home Medications on Admission:  Current Outpatient Medications:    albuterol (VENTOLIN HFA) 108 (90 Base) MCG/ACT inhaler, Inhale 2 puffs into the lungs every 6 (six) hours as needed for wheezing or shortness of breath., Disp: 8 g, Rfl: 0   apixaban (ELIQUIS) 5 MG TABS tablet, Take 1 tablet (5 mg total) by mouth 2 (two) times daily., Disp: 90 tablet, Rfl: 3   atorvastatin (LIPITOR) 80 MG tablet, Take 1 tablet (80 mg total) by mouth daily., Disp: 90 tablet, Rfl: 3   clopidogrel (PLAVIX) 75 MG tablet, Take 1 tablet (75 mg total) by mouth daily., Disp: 92 tablet, Rfl: 3   digoxin (LANOXIN) 0.125 MG tablet, Take 1 tablet (0.125 mg total) by mouth daily., Disp: 90 tablet, Rfl: 3   empagliflozin (JARDIANCE) 10 MG TABS tablet, Take 10 mg by mouth daily., Disp: , Rfl:    furosemide (LASIX) 20 MG tablet, Take 2 tablets (40 mg total) by mouth daily., Disp: 180 tablet, Rfl: 3   metoprolol succinate (TOPROL XL) 50 MG 24 hr tablet, Take 1.5 tablets (75 mg total) by mouth daily., Disp: 140 tablet, Rfl: 3   nitroGLYCERIN (NITROSTAT) 0.4 MG SL tablet, Place 1 tablet (0.4 mg total) under the tongue every 5 (five) minutes as needed for chest pain., Disp: 25 tablet, Rfl: 2   potassium chloride (KLOR-CON) 10 MEQ tablet, Take 2 tablets (20 mEq total) by mouth daily as needed. Take when taking Lasix (Patient taking differently: Take 20 mEq by mouth daily. Take when taking Lasix), Disp: 180 tablet, Rfl: 3  Past Medical History: Past Medical History:   Diagnosis Date   CAD (coronary artery disease)    CHF (congestive heart failure) (HCC)    COPD (chronic obstructive pulmonary disease) (HCC)    CVA (cerebral vascular accident) ([place])    HLD (hyperlipidemia)    Hypertension     Tobacco Use: Social History   Tobacco Use  Smoking Status Former   Packs/day: 1.00   Years: 30.00   Pack years: 30.00   Types: Cigarettes   Quit date: 02/21/1987   Years since quitting: 34.0  Smokeless Tobacco Never    Labs: Recent Review Flowsheet Data     Labs for ITP Cardiac and Pulmonary Rehab Latest Ref Rng & Units 10/27/2020 12/14/2020   Cholestrol 0 - 200 mg/dL 270(H) 144   LDLCALC 0 - 99 mg/dL 199(H) NOT CALCULATED    HDL >40 mg/dL 47 43   Trlycerides <150 mg/dL 120 126   Hemoglobin A1c 4.8 - 5.6 % 5.2 -        Exercise Target Goals: Exercise Program Goal: Individual exercise prescription set using results from initial 6 min walk test and THRR while considering  patients activity barriers and safety.   Exercise Prescription Goal: Initial exercise prescription builds to 30-45 minutes a day of aerobic activity, 2-3 days per week.  Home exercise guidelines will be given to patient during program as part of exercise prescription that the participant will acknowledge.   Education: Aerobic Exercise: - Group verbal and visual  presentation on the components of exercise prescription. Introduces F.I.T.T principle from ACSM for exercise prescriptions.  Reviews F.I.T.T. principles of aerobic exercise including progression. Written material given at graduation.   Education: Resistance Exercise: - Group verbal and visual presentation on the components of exercise prescription. Introduces F.I.T.T principle from ACSM for exercise prescriptions  Reviews F.I.T.T. principles of resistance exercise including progression. Written material given at graduation.    Education: Exercise & Equipment Safety: - Individual verbal instruction and demonstration of  equipment use and safety with use of the equipment. Flowsheet Row Cardiac Rehab from 02/16/2021 in [facility] Cardiac and Pulmonary Rehab  Date 02/16/21  Educator AS  Instruction Review Code 1- Verbalizes Understanding       Education: Exercise Physiology & General Exercise Guidelines: - Group verbal and written instruction with models to review the exercise physiology of the cardiovascular system and associated critical values. Provides general exercise guidelines with specific guidelines to those with heart or lung disease.  Flowsheet Row Cardiac Rehab from 02/16/2021 in [facility] Cardiac and Pulmonary Rehab  Education need identified 02/16/21       Education: Flexibility, Balance, Mind/Body Relaxation: - Group verbal and visual presentation with interactive activity on the components of exercise prescription. Introduces F.I.T.T principle from ACSM for exercise prescriptions. Reviews F.I.T.T. principles of flexibility and balance exercise training including progression. Also discusses the mind body connection.  Reviews various relaxation techniques to help reduce and manage stress (i.e. Deep breathing, progressive muscle relaxation, and visualization). Balance handout provided to take home. Written material given at graduation.   Activity Barriers & Risk Stratification:   6 Minute Walk:  6 Minute Walk     Row Name 02/16/21 1511         6 Minute Walk   Phase Initial     Distance 1070 feet     Walk Time 6 minutes     # of Rest Breaks 0     MPH 2     METS 2.48     RPE 13     Perceived Dyspnea  2     VO2 Peak 8.67     Symptoms Yes (comment)     Comments SOB     Resting HR 105 bpm     Resting BP 122/72     Resting Oxygen Saturation  98 %     Exercise Oxygen Saturation  during 6 min walk 98 %     Max Ex. HR 119 bpm     Max Ex. BP 134/70     2 Minute Post BP 112/68              Oxygen Initial Assessment:   Oxygen Re-Evaluation:   Oxygen Discharge (Final Oxygen  Re-Evaluation):   Initial Exercise Prescription:  Initial Exercise Prescription - 02/16/21 1500       Date of Initial Exercise RX and Referring Provider   Date 02/16/21    Referring Provider Gollan      Oxygen   Maintain Oxygen Saturation 88% or higher      Treadmill   MPH 1.8    Grade 0.5    Minutes 15    METs 2.5      NuStep   Level 1    SPM 80    Minutes 15    METs 2.4      REL-XR   Level 1    Speed 50    Minutes 15    METs 2.4      Biostep-RELP  Level 1    SPM 50    Minutes 15    METs 2      Prescription Details   Frequency (times per week) 3    Duration Progress to 30 minutes of continuous aerobic without signs/symptoms of physical distress      Intensity   THRR 40-80% of Max Heartrate 121-138    Ratings of Perceived Exertion 11-13    Perceived Dyspnea 0-4      Resistance Training   Training Prescription Yes    Weight 3 lb    Reps 10-15             Perform Capillary Blood Glucose checks as needed.  Exercise Prescription Changes:   Exercise Prescription Changes     Row Name 02/16/21 1500 02/27/21 1400 03/13/21 1200         Response to Exercise   Blood Pressure (Admit) 122/72 124/62 102/58     Blood Pressure (Exercise) 134/70 126/64 130/64     Blood Pressure (Exit) 112/68 108/68 100/60     Heart Rate (Admit) 105 bpm 105 bpm 89 bpm     Heart Rate (Exercise) 119 bpm 128 bpm 124 bpm     Heart Rate (Exit) 108 bpm 100 bpm 113 bpm     Oxygen Saturation (Admit) 98 % -- --     Oxygen Saturation (Exercise) 98 % -- --     Rating of Perceived Exertion (Exercise) _0 Perceived Dyspnea (Exercise) 2 -- --     Symptoms SOB -- none     Comments -- second day --     Duration -- Progress to 30 minutes of  aerobic without signs/symptoms of physical distress Continue with 30 min of aerobic exercise without signs/symptoms of physical distress.     Intensity -- THRR unchanged THRR unchanged       Progression   Progression -- Continue to  progress workloads to maintain intensity without signs/symptoms of physical distress. Continue to progress workloads to maintain intensity without signs/symptoms of physical distress.     Average METs -- 1.75 2.37       Resistance Training   Training Prescription -- Yes Yes     Weight -- 3 lb 4 lb     Reps -- 10-15 10-15       Interval Training   Interval Training -- -- No       Treadmill   MPH -- 1.2 --     Grade -- 0.5 --     Minutes -- 15 --     METs -- 2 --       NuStep   Level -- 1 4     SPM -- 80 --     Minutes -- 15 15     METs -- 1.5 2.5       REL-XR   Level -- -- 2     Minutes -- -- 15     METs -- -- 3.8       Track   Laps -- -- 20     Minutes -- -- 15     METs -- -- 2.09       Oxygen   Maintain Oxygen Saturation -- -- 88% or higher              Exercise Comments:   Exercise Comments     Row Name 02/22/21 1603           Exercise Comments First full  day of exercise!  Patient was oriented to gym and equipment including functions, settings, policies, and procedures.  Patient's individual exercise prescription and treatment plan were reviewed.  All starting workloads were established based on the results of the 6 minute walk test done at initial orientation visit.  The plan for exercise progression was also introduced and progression will be customized based on patient's performance and goals.                Exercise Goals and Review:   Exercise Goals     Row Name 02/16/21 1519             Exercise Goals   Increase Physical Activity Yes       Intervention Provide advice, education, support and counseling about physical activity/exercise needs.;Develop an individualized exercise prescription for aerobic and resistive training based on initial evaluation findings, risk stratification, comorbidities and participant's personal goals.       Expected Outcomes Short Term: Attend rehab on a regular basis to increase amount of physical  activity.;Long Term: Add in home exercise to make exercise part of routine and to increase amount of physical activity.;Long Term: Exercising regularly at least 3-5 days a week.       Increase Strength and Stamina Yes       Intervention Provide advice, education, support and counseling about physical activity/exercise needs.;Develop an individualized exercise prescription for aerobic and resistive training based on initial evaluation findings, risk stratification, comorbidities and participant's personal goals.       Expected Outcomes Short Term: Increase workloads from initial exercise prescription for resistance, speed, and METs.;Short Term: Perform resistance training exercises routinely during rehab and add in resistance training at home;Long Term: Improve cardiorespiratory fitness, muscular endurance and strength as measured by increased METs and functional capacity (6MWT)       Able to understand and use rate of perceived exertion (RPE) scale Yes       Intervention Provide education and explanation on how to use RPE scale       Expected Outcomes Short Term: Able to use RPE daily in rehab to express subjective intensity level;Long Term:  Able to use RPE to guide intensity level when exercising independently       Able to understand and use Dyspnea scale Yes       Intervention Provide education and explanation on how to use Dyspnea scale       Expected Outcomes Short Term: Able to use Dyspnea scale daily in rehab to express subjective sense of shortness of breath during exertion;Long Term: Able to use Dyspnea scale to guide intensity level when exercising independently       Knowledge and understanding of Target Heart Rate Range (THRR) Yes       Intervention Provide education and explanation of THRR including how the numbers were predicted and where they are located for reference       Expected Outcomes Short Term: Able to state/look up THRR;Short Term: Able to use daily as guideline for intensity in  rehab;Long Term: Able to use THRR to govern intensity when exercising independently       Able to check pulse independently Yes       Intervention Provide education and demonstration on how to check pulse in carotid and radial arteries.;Review the importance of being able to check your own pulse for safety during independent exercise       Expected Outcomes Short Term: Able to explain why pulse checking is important during independent exercise;Long  Term: Able to check pulse independently and accurately       Understanding of Exercise Prescription Yes       Intervention Provide education, explanation, and written materials on patient's individual exercise prescription       Expected Outcomes Short Term: Able to explain program exercise prescription;Long Term: Able to explain home exercise prescription to exercise independently                Exercise Goals Re-Evaluation :  Exercise Goals Re-Evaluation     Row Name 02/22/21 1603 02/27/21 1443 03/13/21 1216         Exercise Goal Re-Evaluation   Exercise Goals Review Increase Physical Activity;Able to understand and use rate of perceived exertion (RPE) scale;Knowledge and understanding of Target Heart Rate Range (THRR);Understanding of Exercise Prescription;Increase Strength and Stamina;Able to understand and use Dyspnea scale;Able to check pulse independently Increase Physical Activity;Increase Strength and Stamina Increase Physical Activity;Increase Strength and Stamina     Comments Reviewed RPE and dyspnea scales, THR and program prescription with pt today.  Pt voiced understanding and was given a copy of goals to take home. Penny Mccullough has done well in her first sessions.  She did better walking track than TM.  We will continue to monitor progress. Penny Mccullough is doing well in rehab. She tolerates the track better and was able to complete 20 laps! She also increased to level 4 on the T4 Nustep and increased her handweights to 4lbs. Will continue to  monitor.     Expected Outcomes Short: Use RPE daily to regulate intensity. Long: Follow program prescription in THR. Short:  attend consistently Long:  improve overall stamina Short: Continue building up laps on the track Long: Continue to increase overall MET level              Discharge Exercise Prescription (Final Exercise Prescription Changes):  Exercise Prescription Changes - 03/13/21 1200       Response to Exercise   Blood Pressure (Admit) 102/58    Blood Pressure (Exercise) 130/64    Blood Pressure (Exit) 100/60    Heart Rate (Admit) 89 bpm    Heart Rate (Exercise) 124 bpm    Heart Rate (Exit) 113 bpm    Rating of Perceived Exertion (Exercise) 15    Symptoms none    Duration Continue with 30 min of aerobic exercise without signs/symptoms of physical distress.    Intensity THRR unchanged      Progression   Progression Continue to progress workloads to maintain intensity without signs/symptoms of physical distress.    Average METs 2.37      Resistance Training   Training Prescription Yes    Weight 4 lb    Reps 10-15      Interval Training   Interval Training No      NuStep   Level 4    Minutes 15    METs 2.5      REL-XR   Level 2    Minutes 15    METs 3.8      Track   Laps 20    Minutes 15    METs 2.09      Oxygen   Maintain Oxygen Saturation 88% or higher             Nutrition:  Target Goals: Understanding of nutrition guidelines, daily intake of sodium '1500mg'$ , cholesterol '200mg'$ , calories 30% from fat and 7% or less from saturated fats, daily to have 5 or more servings of fruits and vegetables.  Education: All About Nutrition: -Group instruction provided by verbal, written material, interactive activities, discussions, models, and posters to present general guidelines for heart healthy nutrition including fat, fiber, MyPlate, the role of sodium in heart healthy nutrition, utilization of the nutrition label, and utilization of this knowledge for  meal planning. Follow up email sent as well. Written material given at graduation. Flowsheet Row Cardiac Rehab from 02/16/2021 in [facility] Cardiac and Pulmonary Rehab  Education need identified 02/16/21       Biometrics:  Pre Biometrics - 02/16/21 1519       Pre Biometrics   Height 5' (1.524 m)    Weight 131 lb 14.4 oz (59.8 kg)    BMI (Calculated) 25.76    Single Leg Stand 6.33 seconds              Nutrition Therapy Plan and Nutrition Goals:  Nutrition Therapy & Goals - 03/01/21 1651       Nutrition Therapy   RD appointment deferred Yes   Pt declined consultation with RD, will continue to follow up     Personal Nutrition Goals   Nutrition Goal Pt declined consultation with RD, will continue to follow up             Nutrition Assessments:  MEDIFICTS Score Key: ?70 Need to make dietary changes  40-70 Heart Healthy Diet ? 40 Therapeutic Level Cholesterol Diet  Flowsheet Row Cardiac Rehab from 02/16/2021 in [facility] Cardiac and Pulmonary Rehab  Picture Your Plate Total Score on Admission 59      Picture Your Plate Scores: <43 Unhealthy dietary pattern with much room for improvement. 41-50 Dietary pattern unlikely to meet recommendations for good health and room for improvement. 51-60 More healthful dietary pattern, with some room for improvement.  >60 Healthy dietary pattern, although there may be some specific behaviors that could be improved.    Nutrition Goals Re-Evaluation:   Nutrition Goals Discharge (Final Nutrition Goals Re-Evaluation):   Psychosocial: Target Goals: Acknowledge presence or absence of significant depression and/or stress, maximize coping skills, provide positive support system. Participant is able to verbalize types and ability to use techniques and skills needed for reducing stress and depression.   Education: Stress, Anxiety, and Depression - Group verbal and visual presentation to define topics covered.  Reviews how body is impacted  by stress, anxiety, and depression.  Also discusses healthy ways to reduce stress and to treat/manage anxiety and depression.  Written material given at graduation.   Education: Sleep Hygiene -Provides group verbal and written instruction about how sleep can affect your health.  Define sleep hygiene, discuss sleep cycles and impact of sleep habits. Review good sleep hygiene tips.    Initial Review & Psychosocial Screening:  Initial Psych Review & Screening - 01/05/21 0958       Initial Review   Current issues with None Identified      Family Dynamics   Good Support System? Yes    Comments She can look to her daughter and sister for support. Patient has a positive outlook onher health.      Barriers   Psychosocial barriers to participate in program There are no identifiable barriers or psychosocial needs.;The patient should benefit from training in stress management and relaxation.      Screening Interventions   Interventions To provide support and resources with identified psychosocial needs;Encouraged to exercise;Provide feedback about the scores to participant    Expected Outcomes Short Term goal: Utilizing psychosocial counselor, staff and physician to assist with  identification of specific Stressors or current issues interfering with healing process. Setting desired goal for each stressor or current issue identified.;Long Term Goal: Stressors or current issues are controlled or eliminated.;Short Term goal: Identification and review with participant of any Quality of Life or Depression concerns found by scoring the questionnaire.;Long Term goal: The participant improves quality of Life and PHQ9 Scores as seen by post scores and/or verbalization of changes             Quality of Life Scores:   Quality of Life - 02/16/21 1520       Quality of Life   Select Quality of Life      Quality of Life Scores   Health/Function Pre 24.04 %    Socioeconomic Pre 30 %    Psych/Spiritual  Pre 25.71 %    Family Pre 30 %    GLOBAL Pre 26.34 %            Scores of 19 and below usually indicate a poorer quality of life in these areas.  A difference of  2-3 points is a clinically meaningful difference.  A difference of 2-3 points in the total score of the Quality of Life Index has been associated with significant improvement in overall quality of life, self-image, physical symptoms, and general health in studies assessing change in quality of life.  PHQ-9: Recent Review Flowsheet Data     Depression screen [facility] 2/9 02/16/2021 01/24/2021   Decreased Interest 1 0   Down, Depressed, Hopeless 1 1   PHQ - 2 Score 2 1   Trouble concentrating 1 -   Moving slowly or fidgety/restless 1 -   Difficult doing work/chores Not difficult at all -      Interpretation of Total Score  Total Score Depression Severity:  1-4 = Minimal depression, 5-9 = Mild depression, 10-14 = Moderate depression, 15-19 = Moderately severe depression, 20-27 = Severe depression   Psychosocial Evaluation and Intervention:  Psychosocial Evaluation - 01/05/21 1000       Psychosocial Evaluation & Interventions   Interventions Encouraged to exercise with the program and follow exercise prescription;Relaxation education;Stress management education    Comments She can look to her daughter and sister for support. Patient has a positive outlook onher health.    Expected Outcomes Short: Start HeartTrack to help with mood. Long: Maintain a healthy mental state    Continue Psychosocial Services  Follow up required by staff             Psychosocial Re-Evaluation:   Psychosocial Discharge (Final Psychosocial Re-Evaluation):   Vocational Rehabilitation: Provide vocational rehab assistance to qualifying candidates.   Vocational Rehab Evaluation & Intervention:   Education: Education Goals: Education classes will be provided on a variety of topics geared toward better understanding of heart health and risk  factor modification. Participant will state understanding/return demonstration of topics presented as noted by education test scores.  Learning Barriers/Preferences:  Learning Barriers/Preferences - 01/05/21 0957       Learning Barriers/Preferences   Learning Barriers Hearing    Learning Preferences None             General Cardiac Education Topics:  AED/CPR: - Group verbal and written instruction with the use of models to demonstrate the basic use of the AED with the basic ABC's of resuscitation.   Anatomy and Cardiac Procedures: - Group verbal and visual presentation and models provide information about basic cardiac anatomy and function. Reviews the testing methods done to diagnose heart  disease and the outcomes of the test results. Describes the treatment choices: Medical Management, Angioplasty, or Coronary Bypass Surgery for treating various heart conditions including Myocardial Infarction, Angina, Valve Disease, and Cardiac Arrhythmias.  Written material given at graduation.   Medication Safety: - Group verbal and visual instruction to review commonly prescribed medications for heart and lung disease. Reviews the medication, class of the drug, and side effects. Includes the steps to properly store meds and maintain the prescription regimen.  Written material given at graduation.   Intimacy: - Group verbal instruction through game format to discuss how heart and lung disease can affect sexual intimacy. Written material given at graduation..   Know Your Numbers and Heart Failure: - Group verbal and visual instruction to discuss disease risk factors for cardiac and pulmonary disease and treatment options.  Reviews associated critical values for Overweight/Obesity, Hypertension, Cholesterol, and Diabetes.  Discusses basics of heart failure: signs/symptoms and treatments.  Introduces Heart Failure Zone chart for action plan for heart failure.  Written material given at  graduation. Flowsheet Row Cardiac Rehab from 02/16/2021 in [facility] Cardiac and Pulmonary Rehab  Education need identified 02/16/21       Infection Prevention: - Provides verbal and written material to individual with discussion of infection control including proper hand washing and proper equipment cleaning during exercise session. Flowsheet Row Cardiac Rehab from 02/16/2021 in [facility] Cardiac and Pulmonary Rehab  Date 02/16/21  Educator AS  Instruction Review Code 1- Verbalizes Understanding       Falls Prevention: - Provides verbal and written material to individual with discussion of falls prevention and safety. Flowsheet Row Cardiac Rehab from 02/16/2021 in [facility] Cardiac and Pulmonary Rehab  Date 02/16/21  Educator AS  Instruction Review Code 1- Verbalizes Understanding       Other: -Provides group and verbal instruction on various topics (see comments)   Knowledge Questionnaire Score:   Core Components/Risk Factors/Patient Goals at Admission:  Personal Goals and Risk Factors at Admission - 02/16/21 1527       Core Components/Risk Factors/Patient Goals on Admission    Weight Management Yes;Weight Maintenance    Intervention Weight Management: Develop a combined nutrition and exercise program designed to reach desired caloric intake, while maintaining appropriate intake of nutrient and fiber, sodium and fats, and appropriate energy expenditure required for the weight goal.;Weight Management: Provide education and appropriate resources to help participant work on and attain dietary goals.;Weight Management/Obesity: Establish reasonable short term and long term weight goals.    Expected Outcomes Short Term: Continue to assess and modify interventions until short term weight is achieved;Long Term: Adherence to nutrition and physical activity/exercise program aimed toward attainment of established weight goal;Weight Maintenance: Understanding of the daily nutrition guidelines, which  includes 25-35% calories from fat, 7% or less cal from saturated fats, less than $RemoveB'200mg'ebKslvpe$  cholesterol, less than 1.5gm of sodium, & 5 or more servings of fruits and vegetables daily;Understanding recommendations for meals to include 15-35% energy as protein, 25-35% energy from fat, 35-60% energy from carbohydrates, less than $RemoveB'200mg'nRYohMEx$  of dietary cholesterol, 20-35 gm of total fiber daily;Understanding of distribution of calorie intake throughout the day with the consumption of 4-5 meals/snacks    Hypertension Yes    Intervention Provide education on lifestyle modifcations including regular physical activity/exercise, weight management, moderate sodium restriction and increased consumption of fresh fruit, vegetables, and low fat dairy, alcohol moderation, and smoking cessation.;Monitor prescription use compliance.    Expected Outcomes Short Term: Continued assessment and intervention until BP is < 140/48mm  HG in hypertensive participants. < 130/97m HG in hypertensive participants with diabetes, heart failure or chronic kidney disease.;Long Term: Maintenance of blood pressure at goal levels.    Lipids Yes    Intervention Provide education and support for participant on nutrition & aerobic/resistive exercise along with prescribed medications to achieve LDL <746m HDL >4016m   Expected Outcomes Short Term: Participant states understanding of desired cholesterol values and is compliant with medications prescribed. Participant is following exercise prescription and nutrition guidelines.;Long Term: Cholesterol controlled with medications as prescribed, with individualized exercise RX and with personalized nutrition plan. Value goals: LDL < 83m10mDL > 40 mg.             Education:Diabetes - Individual verbal and written instruction to review signs/symptoms of diabetes, desired ranges of glucose level fasting, after meals and with exercise. Acknowledge that pre and post exercise glucose checks will be done for 3  sessions at entry of program.   Core Components/Risk Factors/Patient Goals Review:    Core Components/Risk Factors/Patient Goals at Discharge (Final Review):    ITP Comments:  ITP Comments     Row Name 01/05/21 0957 02/16/21 1537 02/22/21 1603 03/15/21 0814     ITP Comments Virtual Visit completed. Patient informed on EP and RD appointment and 6 Minute walk test. Patient also informed of patient health questionnaires on My Chart. Patient Verbalizes understanding. Visit diagnosis can be found in [facility]/2022. Completed 6MWT and gym orientation. Initial ITP created and sent for review to Dr. MarkEmily Filbertdical Director. First full day of exercise!  Patient was oriented to gym and equipment including functions, settings, policies, and procedures.  Patient's individual exercise prescription and treatment plan were reviewed.  All starting workloads were established based on the results of the 6 minute walk test done at initial orientation visit.  The plan for exercise progression was also introduced and progression will be customized based on patient's performance and goals. 30 Day review completed. Medical Director ITP review done, changes made as directed, and signed approval by Medical Director.             Comments:

## 2021-03-15 NOTE — Progress Notes (Signed)
Daily Session Note  Patient Details  Name: Penny Mccullough MRN: [id number] Date of Birth: [birth year] Referring Provider:   Flowsheet Row Cardiac Rehab from 02/16/2021 in [facility] Cardiac and Pulmonary Rehab  Referring Provider Gollan       Encounter Date: 03/15/2021  Check In:  Session Check In - 03/15/21 1621       Check-In   Supervising physician immediately available to respond to emergencies See telemetry face sheet for immediately available ER MD    Staff Present Birdie Sons, MPA, RN;Joseph Alcus Dad, RN BSN    Virtual Visit No    Medication changes reported     No    Fall or balance concerns reported    No    Tobacco Cessation No Change    Warm-up and Cool-down Performed on first and last piece of equipment    Resistance Training Performed Yes    VAD Patient? No    PAD/SET Patient? No      Pain Assessment   Currently in Pain? No/denies                Social History   Tobacco Use  Smoking Status Former   Packs/day: 1.00   Years: 30.00   Pack years: 30.00   Types: Cigarettes   Quit date: 02/21/1987   Years since quitting: 34.0  Smokeless Tobacco Never    Goals Met:  Independence with exercise equipment Exercise tolerated well No report of concerns or symptoms today Strength training completed today  Goals Unmet:  Not Applicable  Comments: Pt able to follow exercise prescription today without complaint.  Will continue to monitor for progression.    Dr. Emily Filbert is Medical Director for [place].  Dr. Ottie Glazier is Medical Director for [facility] Pulmonary Rehabilitation.

## 2021-03-16 ENCOUNTER — Other Ambulatory Visit: Payer: Self-pay | Admitting: Cardiovascular Disease

## 2021-03-16 ENCOUNTER — Ambulatory Visit: Payer: Medicare Other | Admitting: Anesthesiology

## 2021-03-16 ENCOUNTER — Encounter: Payer: Self-pay | Admitting: Cardiovascular Disease

## 2021-03-16 ENCOUNTER — Encounter
Admission: RE | Disposition: A | Discharge status: Home / Self Care | Payer: Self-pay | Source: Home / Self Care | Attending: Cardiovascular Disease

## 2021-03-16 ENCOUNTER — Other Ambulatory Visit: Payer: Self-pay

## 2021-03-16 ENCOUNTER — Ambulatory Visit
Admission: RE | Admit: 2021-03-16 | Discharge: 2021-03-16 | Disposition: A | Discharge status: Home / Self Care | Payer: Medicare Other | Attending: Cardiovascular Disease | Admitting: Cardiovascular Disease

## 2021-03-16 DIAGNOSIS — Z955 Presence of coronary angioplasty implant and graft: Secondary | ICD-10-CM | POA: Insufficient documentation

## 2021-03-16 DIAGNOSIS — I2510 Atherosclerotic heart disease of native coronary artery without angina pectoris: Secondary | ICD-10-CM | POA: Insufficient documentation

## 2021-03-16 DIAGNOSIS — Z87891 Personal history of nicotine dependence: Secondary | ICD-10-CM | POA: Diagnosis not present

## 2021-03-16 DIAGNOSIS — I4819 Other persistent atrial fibrillation: Secondary | ICD-10-CM | POA: Insufficient documentation

## 2021-03-16 DIAGNOSIS — Z951 Presence of aortocoronary bypass graft: Secondary | ICD-10-CM | POA: Diagnosis not present

## 2021-03-16 DIAGNOSIS — Z7901 Long term (current) use of anticoagulants: Secondary | ICD-10-CM | POA: Insufficient documentation

## 2021-03-16 DIAGNOSIS — I509 Heart failure, unspecified: Secondary | ICD-10-CM | POA: Insufficient documentation

## 2021-03-16 DIAGNOSIS — I110 Hypertensive heart disease with heart failure: Secondary | ICD-10-CM | POA: Insufficient documentation

## 2021-03-16 DIAGNOSIS — I493 Ventricular premature depolarization: Secondary | ICD-10-CM | POA: Diagnosis not present

## 2021-03-16 DIAGNOSIS — J449 Chronic obstructive pulmonary disease, unspecified: Secondary | ICD-10-CM | POA: Insufficient documentation

## 2021-03-16 DIAGNOSIS — I252 Old myocardial infarction: Secondary | ICD-10-CM | POA: Insufficient documentation

## 2021-03-16 HISTORY — PX: CARDIOVERSION: SHX1299

## 2021-03-16 LAB — GLUCOSE, CAPILLARY: Glucose-Capillary: 107 mg/dL — ABNORMAL HIGH (ref 70–99)

## 2021-03-16 SURGERY — CARDIOVERSION
Anesthesia: General

## 2021-03-16 MED ORDER — PROPOFOL 500 MG/50ML IV EMUL
INTRAVENOUS | Status: AC
  Filled 2021-03-16: qty 50

## 2021-03-16 MED ORDER — SODIUM CHLORIDE 0.9 % WEIGHT BASED INFUSION: 3.0000 mL/kg/h | INTRAVENOUS | Status: AC

## 2021-03-16 MED ORDER — SODIUM CHLORIDE 0.9 % WEIGHT BASED INFUSION: 1.0000 mL/kg/h | INTRAVENOUS | Status: DC

## 2021-03-16 MED ORDER — ASPIRIN 81 MG PO CHEW: 81.0000 mg | CHEWABLE_TABLET | ORAL | Status: DC

## 2021-03-16 MED ORDER — ACETAMINOPHEN 325 MG PO TABS: 650.0000 mg | ORAL_TABLET | Freq: Once | ORAL | Status: DC | PRN

## 2021-03-16 MED ORDER — SODIUM CHLORIDE 0.9 % IV SOLN: INTRAVENOUS | Status: DC

## 2021-03-16 MED ORDER — ONDANSETRON HCL 4 MG/2ML IJ SOLN: 4.0000 mg | Freq: Once | INTRAMUSCULAR | Status: DC | PRN

## 2021-03-16 MED ORDER — PROPOFOL 10 MG/ML IV BOLUS
INTRAVENOUS | Status: DC | PRN
  Administered 2021-03-16: 40 mg via INTRAVENOUS

## 2021-03-16 MED ORDER — ACETAMINOPHEN 160 MG/5ML PO SOLN
325.0000 mg | ORAL | Status: DC | PRN
  Filled 2021-03-16: qty 20.3

## 2021-03-16 MED ORDER — SODIUM CHLORIDE 0.9 % IV SOLN: INTRAVENOUS | Status: DC | PRN

## 2021-03-16 NOTE — Transfer of Care (Signed)
Immediate Anesthesia Transfer of Care Note  Patient: Penny Mccullough  Procedure(s) Performed: CARDIOVERSION  Patient Location: PACU and Cath Lab  Anesthesia Type:General  Level of Consciousness: drowsy  Airway & Oxygen Therapy: Patient Spontanous Breathing and Patient connected to nasal cannula oxygen  Post-op Assessment: Report given to RN  Post vital signs: stable  Last Vitals:  Vitals Value Taken Time  BP 119/78 03/16/21 0730  Temp    Pulse 53 03/16/21 0740  Resp 23 03/16/21 0740  SpO2 100 % 03/16/21 0740    Last Pain:  Vitals:   03/16/21 0705  TempSrc: Oral  PainSc: 0-No pain         Complications: No notable events documented.

## 2021-03-16 NOTE — Anesthesia Postprocedure Evaluation (Signed)
Anesthesia Post Note  Patient: Penny Mccullough  Procedure(s) Performed: CARDIOVERSION  Patient location during evaluation: PACU Anesthesia Type: General Level of consciousness: awake and alert, oriented and patient cooperative Pain management: pain level controlled Vital Signs Assessment: post-procedure vital signs reviewed and stable Respiratory status: spontaneous breathing, nonlabored ventilation and respiratory function stable Cardiovascular status: blood pressure returned to baseline and stable Postop Assessment: adequate PO intake Anesthetic complications: no   No notable events documented.   Last Vitals:  Vitals:   03/16/21 0800 03/16/21 0815  BP: 115/72 120/76  Pulse: (!) 56 (!) 58  Resp: (!) 21 (!) 22  Temp:    SpO2: 99% 100%    Last Pain:  Vitals:   03/16/21 0815  TempSrc:   PainSc: 0-No pain                 Reed Breech

## 2021-03-16 NOTE — CV Procedure (Signed)
Cardioversion procedure note For atrial fibrillation, persistent.  Procedure Details:  Consent: Risks of procedure as well as the alternatives and risks of each were explained to the (patient/caregiver).  Consent for procedure obtained.  Time Out: Verified patient identification, verified procedure, site/side was marked, verified correct patient position, special equipment/implants available, medications/allergies/relevent history reviewed, required imaging and test results available.  Performed  Patient placed on cardiac monitor, pulse oximetry, supplemental oxygen as necessary.   Sedation given: propofol IV, Dr. Mazzoni Pacer pads placed anterior and posterior chest.   Cardioverted 1 time(s).   Cardioverted at  150 J. Synchronized biphasic Converted to NSR   Evaluation: Findings: Post procedure EKG shows: NSR Complications: None Patient did tolerate procedure well.  Time Spent Directly with the Patient:  45 minutes   Tim Katharyn Schauer, M.D., Ph.D.  

## 2021-03-16 NOTE — Anesthesia Preprocedure Evaluation (Addendum)
Anesthesia Evaluation  Patient identified by MRN, date of birth, ID band Patient awake    Reviewed: Allergy & Precautions, NPO status , Patient's Chart, lab work & pertinent test results  History of Anesthesia Complications Negative for: history of anesthetic complications  Airway Mallampati: III   Neck ROM: Full    Dental  (+) Upper Dentures, Lower Dentures   Pulmonary COPD, former smoker (quit 1989),    Pulmonary exam normal breath sounds clear to auscultation       Cardiovascular hypertension, + CAD (s/p CABG, MI, stents on Plavix) and +CHF  + dysrhythmias (a fib on Eliquis)  Rhythm:Irregular Rate:Normal  ECG 03/07/21: A fib with PVCs   Neuro/Psych HOH CVA, No Residual Symptoms    GI/Hepatic negative GI ROS,   Endo/Other  negative endocrine ROS  Renal/GU negative Renal ROS     Musculoskeletal   Abdominal   Peds  Hematology negative hematology ROS (+)   Anesthesia Other Findings   Reproductive/Obstetrics                            Anesthesia Physical Anesthesia Plan  ASA: 3  Anesthesia Plan: General   Post-op Pain Management:    Induction: Intravenous  PONV Risk Score and Plan: 3 and Propofol infusion, TIVA and Treatment may vary due to age or medical condition  Airway Management Planned: Natural Airway  Additional Equipment:   Intra-op Plan:   Post-operative Plan:   Informed Consent: I have reviewed the patients History and Physical, chart, labs and discussed the procedure including the risks, benefits and alternatives for the proposed anesthesia with the patient or authorized representative who has indicated his/her understanding and acceptance.       Plan Discussed with: CRNA  Anesthesia Plan Comments: (LMA/GETA backup discussed.  Patient consented for risks of anesthesia including but not limited to:  - adverse reactions to medications - damage to eyes, teeth,  lips or other oral mucosa - nerve damage due to positioning  - sore throat or hoarseness - damage to heart, brain, nerves, lungs, other parts of body or loss of life  Informed patient about role of CRNA in peri- and intra-operative care.  Patient voiced understanding.)        Anesthesia Quick Evaluation

## 2021-03-19 NOTE — Interval H&P Note (Signed)
History and Physical Interval Note:  03/19/2021 11:37 AM  Penny Mccullough  has presented today for surgery, with the diagnosis of Cardioversion  Afib.  The various methods of treatment have been discussed with the patient and family. After consideration of risks, benefits and other options for treatment, the patient has consented to  Procedure(s): CARDIOVERSION (N/A) as a surgical intervention.  The patient's history has been reviewed, patient examined, no change in status, stable for surgery.  I have reviewed the patient's chart and labs.  Questions were answered to the patient's satisfaction.     [name]

## 2021-03-19 NOTE — H&P (Signed)
H&P Addendum, pre-cardioversion ° °Patient was seen and evaluated prior to -cardioversion procedure °Symptoms, prior testing details again confirmed with the patient °Patient examined, no significant change from prior exam °Lab work reviewed in detail personally by myself °Patient understands risk and benefit of the procedure,  °The risks (stroke, cardiac arrhythmias rarely resulting in the need for a temporary or permanent pacemaker, skin irritation or burns and complications associated with conscious sedation including aspiration, arrhythmia, respiratory failure and death), benefits (restoration of normal sinus rhythm) and alternatives of a direct current cardioversion were explained in detail °Patient willing to proceed. ° °Signed, °Tim Yee Gangi, MD, Ph.D °[facility]  °

## 2021-03-20 ENCOUNTER — Other Ambulatory Visit: Payer: Self-pay

## 2021-03-20 DIAGNOSIS — Z955 Presence of coronary angioplasty implant and graft: Secondary | ICD-10-CM

## 2021-03-20 DIAGNOSIS — I214 Non-ST elevation (NSTEMI) myocardial infarction: Secondary | ICD-10-CM

## 2021-03-20 NOTE — Progress Notes (Signed)
Daily Session Note  Patient Details  Name: Penny Mccullough MRN: [id number] Date of Birth: 07-26-1945 Referring Provider:   Flowsheet Row Cardiac Rehab from 02/16/2021 in [facility] Cardiac and Pulmonary Rehab  Referring Provider Gollan       Encounter Date: 03/20/2021  Check In:  Session Check In - 03/20/21 1608       Check-In   Supervising physician immediately available to respond to emergencies See telemetry face sheet for immediately available ER MD    Location [facility]-Cardiac & Pulmonary Rehab    Staff Present Birdie Sons, MPA, Nino Glow, MS, ASCM CEP, Exercise Physiologist;Joseph Tessie Fass, [place]    Virtual Visit No    Medication changes reported     No    Fall or balance concerns reported    No    Tobacco Cessation No Change    Warm-up and Cool-down Performed on first and last piece of equipment    Resistance Training Performed Yes    VAD Patient? No    PAD/SET Patient? No      Pain Assessment   Currently in Pain? No/denies                Social History   Tobacco Use  Smoking Status Former   Packs/day: 1.00   Years: 30.00   Pack years: 30.00   Types: Cigarettes   Quit date: 02/21/1987   Years since quitting: 34.0  Smokeless Tobacco Never    Goals Met:  Independence with exercise equipment Exercise tolerated well No report of concerns or symptoms today Strength training completed today  Goals Unmet:  Not Applicable  Comments: Pt had a successful cardioversion on 03/16/21. Dr. Rockey Situ cleared her to resume cardiac rehab/exercise today. Patient able to follow exercise prescription today without complaint.  Will continue to monitor for progression.    Dr. Emily Filbert is Medical Director for [place].  Dr. Ottie Glazier is Medical Director for [facility] Pulmonary Rehabilitation.

## 2021-03-21 ENCOUNTER — Other Ambulatory Visit: Payer: Self-pay

## 2021-03-21 ENCOUNTER — Encounter: Payer: Self-pay | Admitting: Family

## 2021-03-21 ENCOUNTER — Ambulatory Visit: Payer: Medicare Other | Attending: Family | Admitting: Family

## 2021-03-21 VITALS — BP 142/62 | HR 57 | Resp 16 | Ht 61.0 in | Wt 123.5 lb

## 2021-03-21 DIAGNOSIS — Z87891 Personal history of nicotine dependence: Secondary | ICD-10-CM | POA: Diagnosis not present

## 2021-03-21 DIAGNOSIS — I2510 Atherosclerotic heart disease of native coronary artery without angina pectoris: Secondary | ICD-10-CM | POA: Diagnosis not present

## 2021-03-21 DIAGNOSIS — I509 Heart failure, unspecified: Secondary | ICD-10-CM | POA: Diagnosis not present

## 2021-03-21 DIAGNOSIS — Z09 Encounter for follow-up examination after completed treatment for conditions other than malignant neoplasm: Secondary | ICD-10-CM | POA: Diagnosis not present

## 2021-03-21 DIAGNOSIS — J449 Chronic obstructive pulmonary disease, unspecified: Secondary | ICD-10-CM | POA: Insufficient documentation

## 2021-03-21 DIAGNOSIS — I480 Paroxysmal atrial fibrillation: Secondary | ICD-10-CM | POA: Insufficient documentation

## 2021-03-21 DIAGNOSIS — E785 Hyperlipidemia, unspecified: Secondary | ICD-10-CM | POA: Diagnosis not present

## 2021-03-21 DIAGNOSIS — Z79899 Other long term (current) drug therapy: Secondary | ICD-10-CM | POA: Diagnosis not present

## 2021-03-21 DIAGNOSIS — R0602 Shortness of breath: Secondary | ICD-10-CM | POA: Diagnosis present

## 2021-03-21 DIAGNOSIS — Z8673 Personal history of transient ischemic attack (TIA), and cerebral infarction without residual deficits: Secondary | ICD-10-CM | POA: Insufficient documentation

## 2021-03-21 DIAGNOSIS — I10 Essential (primary) hypertension: Secondary | ICD-10-CM | POA: Diagnosis not present

## 2021-03-21 DIAGNOSIS — I5022 Chronic systolic (congestive) heart failure: Secondary | ICD-10-CM

## 2021-03-21 DIAGNOSIS — R0683 Snoring: Secondary | ICD-10-CM | POA: Diagnosis not present

## 2021-03-21 DIAGNOSIS — I110 Hypertensive heart disease with heart failure: Secondary | ICD-10-CM | POA: Insufficient documentation

## 2021-03-21 MED ORDER — POTASSIUM CHLORIDE ER 10 MEQ PO TBCR: 20.0000 meq | EXTENDED_RELEASE_TABLET | Freq: Every day | ORAL | 3 refills | Status: DC

## 2021-03-21 NOTE — Patient Instructions (Signed)
The [facility] will be moving around the corner to [address] mid-February. Our phone number will remain the same.  Your potassium was sent in to the pharmacy, so pick it up today.  Continue to weigh.  Call us if you have a weight gain 2 lbs/day or 5 lbs/week.  Return as needed.

## 2021-03-21 NOTE — Progress Notes (Signed)
Patient ID: Penny Mccullough, female    DOB: Dec 09, 1945, 76 y.o.   MRN: [id number]   Penny Mccullough is a 76 y/o female with a history of CAD, hyperlipidemia, HTN, stroke, previous tobacco use, COPD and chronic heart failure.   Echo report from 10/29/20 reviewed and showed an EF of 45-50% along with mild LVH, mildly elevated PA pressure of 43.2 mmHg, severe LAE and moderate MR.   LHC done 10/28/20 and showed:  Ost LAD lesion is 75% stenosed.   Mid LAD lesion is 99% stenosed.   A drug-eluting stent was successfully placed using a STENT ONYX FRONTIER 2.5X15.   A drug-eluting stent was successfully placed using a STENT ONYX FRONTIER 2.5X30.   A drug-eluting stent was successfully placed using a STENT ONYX FRONTIER 2.5X30.   Post intervention, there is a 0% residual stenosis.   Post intervention, there is a 0% residual stenosis.   The left ventricular systolic function is normal.   LV end diastolic pressure is normal.  Conclusion Cardiac cath Left main large free of disease Left ventricular function normal a EF of around 55% mild apical hypo- LAD large 75% ostial lesion 99% mid lesion ulcerated TIMI-3 flow Circumflex large left dominant free of disease RCA nondominant Intervention PCI and stent to mid LAD with DES 2.5 x 30 mm DES PCI and stent to ostial LAD 2.5 x 15 mm DES  Was in the ED 01/22/21 due to continued shortness of breath. Given IV lasix and oral diuretic dose changed and she was released.   Successful cardioversion on 03/16/21.  She presents today for a follow-up visit with a chief complaint of minimal shortness of breath upon moderate exertion, and fatigue. She describes this as having been present for several years although has improved somewhat since she was cardioverted. She denies any CP, dizziness, syncope, difficulty sleeping, abdominal distention, palpitations, pedal edema, chest pain or weight gain.   Resumed jardiance at last visit and feels better. Currently taking 40 mg  furosemide and 27meq adjusted by cardiology.  She weighs every other day and reports that her weights do not fluctuate. She has been participating in cardiac rehab and does not enjoy it, but understands she needs to do it. She endorses several episodes of hypotension, most recent yesterday at cardiac rehab where her BP was 99991111 systolic.   Past Medical History:  Diagnosis Date   CAD (coronary artery disease)    CHF (congestive heart failure) (HCC)    COPD (chronic obstructive pulmonary disease) (HCC)    CVA (cerebral vascular accident) ([place])    HLD (hyperlipidemia)    Hypertension    MI (myocardial infarction) ([place]) 10/2020   Past Surgical History:  Procedure Laterality Date   CARDIOVERSION N/A 03/16/2021   Procedure: CARDIOVERSION;  Surgeon: Minna Merritts, MD;  Location: [facility] ORS;  Service: Cardiovascular;  Laterality: N/A;   CORONARY ARTERY BYPASS GRAFT  1989   CORONARY STENT INTERVENTION N/A 10/28/2020   Procedure: CORONARY STENT INTERVENTION;  Surgeon: Yolonda Kida, MD;  Location: [place] CV [facility];  Service: Cardiovascular;  Laterality: N/A;   LEFT HEART CATH AND CORONARY ANGIOGRAPHY N/A 10/28/2020   Procedure: LEFT HEART CATH AND CORONARY ANGIOGRAPHY;  Surgeon: Yolonda Kida, MD;  Location: [place] [facility];  Service: Cardiovascular;  Laterality: N/A;   Family History  Problem Relation Age of Onset   Hypertension Mother    Social History   Tobacco Use   Smoking status: Former    Packs/day: 1.00  Years: 30.00    Pack years: 30.00    Types: Cigarettes    Quit date: 02/21/1987    Years since quitting: 34.1   Smokeless tobacco: Never  Substance Use Topics   Alcohol use: Not Currently   Allergies  Allergen Reactions   Codeine Nausea And Vomiting   Prior to Admission medications   Medication Sig Start Date End Date Taking? Authorizing Provider  albuterol (VENTOLIN HFA) 108 (90 Base) MCG/ACT inhaler Inhale 2 puffs into the lungs every 6 (six) hours  as needed for wheezing or shortness of breath. 01/09/21  Yes Minna Merritts, MD  aspirin 81 MG EC tablet Take 81 mg by mouth daily.   Yes [provider]  atorvastatin (LIPITOR) 80 MG tablet Take 1 tablet (80 mg total) by mouth daily. 01/09/21  Yes Gollan, Kathlene November, MD  empagliflozin (JARDIANCE) 10 MG TABS tablet Take by mouth daily.   Yes [provider]  furosemide (LASIX) 20 MG tablet Take 2 tablets (40 mg total) by mouth 2 (two) times daily for 5 days. For shortness of breath or leg swelling 01/22/21 01/31/21 Yes Lucrezia Starch, MD  metoprolol succinate (TOPROL XL) 50 MG 24 hr tablet Take 1 tablet (50 mg total) by mouth daily. 01/09/21  Yes Gollan, Kathlene November, MD  nitroGLYCERIN (NITROSTAT) 0.4 MG SL tablet Place 1 tablet (0.4 mg total) under the tongue every 5 (five) minutes as needed for chest pain. 12/09/20  Yes Furth, Cadence H, PA-C  potassium chloride (KLOR-CON) 10 MEQ tablet Take 1 tablet (10 mEq total) by mouth daily as needed. Take when taking Lasix Patient taking differently: Take 20 mEq by mouth daily as needed. Take when taking Lasix 01/09/21  Yes Gollan, Kathlene November, MD  sacubitril-valsartan (ENTRESTO) 24-26 MG Take 1 tablet by mouth 2 (two) times daily. 01/09/21  Yes Minna Merritts, MD  ticagrelor (BRILINTA) 90 MG TABS tablet Take 1 tablet (90 mg total) by mouth 2 (two) times daily. 01/09/21  Yes Gollan, Kathlene November, MD  predniSONE (DELTASONE) 20 MG tablet Please take 40 mg for 3 days, Then 20 mg for 3 days, Then 10 mg for 3 days Patient not taking: Reported on 01/24/2021 01/09/21   Minna Merritts, MD   Review of Systems  Constitutional:  Positive for fatigue. Negative for appetite change.  HENT:  Positive for hearing loss. Negative for congestion and sore throat.   Eyes: Negative.   Respiratory:  Positive for cough, shortness of breath (minimal) and wheezing. Negative for chest tightness.   Cardiovascular:  Negative for chest pain, palpitations and leg  swelling.  Gastrointestinal:  Negative for abdominal distention and abdominal pain.  Endocrine: Negative.   Genitourinary: Negative.   Musculoskeletal:  Negative for back pain and neck pain.  Skin: Negative.   Allergic/Immunologic: Negative.   Neurological:  Negative for dizziness, syncope, weakness and light-headedness.  Hematological:  Negative for adenopathy. Does not bruise/bleed easily.  Psychiatric/Behavioral:  Negative for dysphoric mood and sleep disturbance (sleeping on 1 pillow). The patient is not nervous/anxious.    Vitals:   03/21/21 0924  BP: (!) 142/62  Pulse: (!) 57  Resp: 16  SpO2: 100%  Weight: 123 lb 8 oz (56 kg)  Height: 5\' 1"  (1.549 m)   Wt Readings from Last 3 Encounters:  03/21/21 123 lb 8 oz (56 kg)  03/16/21 128 lb (58.1 kg)  03/07/21 128 lb (58.1 kg)   Lab Results  Component Value Date   CREATININE 0.94 03/07/2021  CREATININE 1.02 (H) 01/31/2021   CREATININE 0.69 01/22/2021   Physical Exam Vitals and nursing note reviewed.  Constitutional:      Appearance: Normal appearance.  HENT:     Head: Normocephalic and atraumatic.     Right Ear: Decreased hearing noted.     Left Ear: Decreased hearing noted.  Cardiovascular:     Rate and Rhythm: Bradycardia present. Rhythm irregular.  Pulmonary:     Effort: Pulmonary effort is normal. No respiratory distress.     Breath sounds: Wheezing present. No rales.  Abdominal:     General: There is no distension.     Palpations: Abdomen is soft.  Musculoskeletal:        General: No tenderness.     Cervical back: Normal range of motion and neck supple.     Right lower leg: No edema.     Left lower leg: No edema.  Skin:    General: Skin is warm and dry.  Neurological:     General: No focal deficit present.     Mental Status: She is alert and oriented to person, place, and time.  Psychiatric:        Mood and Affect: Mood normal.        Behavior: Behavior normal.        Thought Content: Thought content  normal.   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 Rebecka Apley - BMP 03/07/21 reviewed and showed sodium 138, potassium 4.4, creatinine 0.94 and GFR 64  3: COPD- - has albuterol inhaler PRN   4: PAF- - saw cardiology Rockey Situ) 01/09/21 - remains on brilinta - cardioverted 03/16/21   Patient did not bring her medications nor a list. Each medication was verbally reviewed with the patient and she was encouraged to bring the bottles to every visit to confirm accuracy of list.    Return as needed. Discussed parameters for when to return.

## 2021-03-27 ENCOUNTER — Other Ambulatory Visit: Payer: Self-pay

## 2021-03-27 ENCOUNTER — Encounter: Payer: Medicare Other | Attending: Cardiovascular Disease

## 2021-03-27 DIAGNOSIS — I214 Non-ST elevation (NSTEMI) myocardial infarction: Secondary | ICD-10-CM

## 2021-03-27 DIAGNOSIS — Z955 Presence of coronary angioplasty implant and graft: Secondary | ICD-10-CM | POA: Diagnosis present

## 2021-03-27 DIAGNOSIS — I252 Old myocardial infarction: Secondary | ICD-10-CM | POA: Diagnosis present

## 2021-03-27 NOTE — Progress Notes (Signed)
Daily Session Note  Patient Details  Name: Penny Mccullough MRN: [id number] Date of Birth: 07-24-1945 Referring Provider:   Flowsheet Row Cardiac Rehab from 02/16/2021 in [facility] Cardiac and Pulmonary Rehab  Referring Provider Gollan       Encounter Date: 03/27/2021  Check In:  Session Check In - 03/27/21 1605       Check-In   Supervising physician immediately available to respond to emergencies See telemetry face sheet for immediately available ER MD    Location [facility]-Cardiac & Pulmonary Rehab    Staff Present Birdie Sons, MPA, Nino Glow, MS, ASCM CEP, Exercise Physiologist;Joseph Tessie Fass, [place]    Virtual Visit No    Medication changes reported     No    Fall or balance concerns reported    No    Tobacco Cessation No Change    Warm-up and Cool-down Performed on first and last piece of equipment    Resistance Training Performed Yes    VAD Patient? No    PAD/SET Patient? No      Pain Assessment   Currently in Pain? No/denies                Social History   Tobacco Use  Smoking Status Former   Packs/day: 1.00   Years: 30.00   Pack years: 30.00   Types: Cigarettes   Quit date: 02/21/1987   Years since quitting: 34.1  Smokeless Tobacco Never    Goals Met:  Independence with exercise equipment Exercise tolerated well No report of concerns or symptoms today Strength training completed today  Goals Unmet:  Not Applicable  Comments: Pt able to follow exercise prescription today without complaint.  Will continue to monitor for progression.    Dr. Emily Filbert is Medical Director for [place].  Dr. Ottie Glazier is Medical Director for [facility] Pulmonary Rehabilitation.

## 2021-03-29 ENCOUNTER — Other Ambulatory Visit: Payer: Self-pay

## 2021-03-29 DIAGNOSIS — I252 Old myocardial infarction: Secondary | ICD-10-CM | POA: Diagnosis not present

## 2021-03-29 DIAGNOSIS — I214 Non-ST elevation (NSTEMI) myocardial infarction: Secondary | ICD-10-CM

## 2021-03-29 DIAGNOSIS — Z955 Presence of coronary angioplasty implant and graft: Secondary | ICD-10-CM

## 2021-03-29 NOTE — Progress Notes (Signed)
Daily Session Note  Patient Details  Name: Monicka Cyran MRN: [id number] Date of Birth: Aug 20, 1945 Referring Provider:   Flowsheet Row Cardiac Rehab from 02/16/2021 in [facility] Cardiac and Pulmonary Rehab  Referring Provider Gollan       Encounter Date: 03/29/2021  Check In:  Session Check In - 03/29/21 1600       Check-In   Supervising physician immediately available to respond to emergencies See telemetry face sheet for immediately available ER MD    Location [facility]Card[facility] Pul[facility] Rehab    Staff Present Birdie Sons, MPA, Nino Glow, MS, ASCM CEP, Exercise Physiologist;Melissa Caiola, RDN, LDN    Virtual Visit No    Medication changes reported     No    Fall or balance concerns reported    No    Tobacco Cessation No Change    Warm-up and Cool-down Performed on first and last piece of equipment    Resistance Training Performed Yes    VAD Patient? No    PAD/SET Patient? No      Pain Assessment   Currently in Pain? No/denies                Social History   Tobacco Use  Smoking Status Former   Packs/day: 1.00   Years: 30.00   Pack years: 30.00   Types: Cigarettes   Quit date: 02/21/1987   Years since quitting: 34.1  Smokeless Tobacco Never    Goals Met:  Independence with exercise equipment Exercise tolerated well No report of concerns or symptoms today Strength training completed today  Goals Unmet:  Not Applicable  Comments: Pt able to follow exercise prescription today without complaint.  Will continue to monitor for progression.    Dr. Emily Filbert is Medical Director for [place].  Dr. Ottie Glazier is Medical Director for [facility] Pulmonary Rehabilitation.

## 2021-03-30 ENCOUNTER — Other Ambulatory Visit: Payer: Self-pay

## 2021-03-30 ENCOUNTER — Encounter: Payer: Medicare Other | Admitting: *Deleted

## 2021-03-30 DIAGNOSIS — I252 Old myocardial infarction: Secondary | ICD-10-CM | POA: Diagnosis not present

## 2021-03-30 DIAGNOSIS — Z955 Presence of coronary angioplasty implant and graft: Secondary | ICD-10-CM

## 2021-03-30 DIAGNOSIS — I214 Non-ST elevation (NSTEMI) myocardial infarction: Secondary | ICD-10-CM

## 2021-03-30 NOTE — Progress Notes (Signed)
Daily Session Note  Patient Details  Name: Chinita Schimpf MRN: [id number] Date of Birth: 24-May-1945 Referring Provider:   Flowsheet Row Cardiac Rehab from 02/16/2021 in [facility] Cardiac and Pulmonary Rehab  Referring Provider Gollan       Encounter Date: 03/30/2021  Check In:  Session Check In - 03/30/21 1605       Check-In   Supervising physician immediately available to respond to emergencies See telemetry face sheet for immediately available ER MD    Location [facility]Card[facility] Pulmonary Rehab    Staff Present Renita Papa, RN BSN;Joseph Judson, RCP,RRT,BSRT;Jessica [place], [place], RCEP, CCRP, CCET    Virtual Visit No    Medication changes reported     No    Fall or balance concerns reported    No    Warm-up and Cool-down Performed on first and last piece of equipment    Resistance Training Performed Yes    VAD Patient? No    PAD/SET Patient? No      Pain Assessment   Currently in Pain? No/denies                Social History   Tobacco Use  Smoking Status Former   Packs/day: 1.00   Years: 30.00   Pack years: 30.00   Types: Cigarettes   Quit date: 02/21/1987   Years since quitting: 34.1  Smokeless Tobacco Never    Goals Met:  Independence with exercise equipment Exercise tolerated well No report of concerns or symptoms today Strength training completed today  Goals Unmet:  Not Applicable  Comments: Pt able to follow exercise prescription today without complaint.  Will continue to monitor for progression.    Dr. Emily Filbert is Medical Director for [place].  Dr. Ottie Glazier is Medical Director for [facility] Pulmonary Rehabilitation.

## 2021-04-03 ENCOUNTER — Other Ambulatory Visit: Payer: Self-pay

## 2021-04-03 DIAGNOSIS — I214 Non-ST elevation (NSTEMI) myocardial infarction: Secondary | ICD-10-CM

## 2021-04-03 DIAGNOSIS — I252 Old myocardial infarction: Secondary | ICD-10-CM | POA: Diagnosis not present

## 2021-04-03 DIAGNOSIS — Z955 Presence of coronary angioplasty implant and graft: Secondary | ICD-10-CM

## 2021-04-03 NOTE — Progress Notes (Signed)
Daily Session Note  Patient Details  Name: Penny Mccullough MRN: [id number] Date of Birth: [birth year] Referring Provider:   Flowsheet Row Cardiac Rehab from 02/16/2021 in [facility] Cardiac and Pulmonary Rehab  Referring Provider Gollan       Encounter Date: 04/03/2021  Check In:  Session Check In - 04/03/21 2301       Check-In   Supervising physician immediately available to respond to emergencies See telemetry face sheet for immediately available ER MD    Location [facility] Re[facility]    Staff Present Birdie Sons, MPA, Nino Glow, MS, ASCM CEP, Exercise Physiologist;Joseph Tessie Fass, [place]    Virtual Visit No    Medication changes reported     No    Fall or balance concerns reported    No    Tobacco Cessation No Change    Warm-up and Cool-down Performed on first and last piece of equipment    Resistance Training Performed Yes    VAD Patient? No    PAD/SET Patient? No      Pain Assessment   Currently in Pain? No/denies                Social History   Tobacco Use  Smoking Status Former   Packs/day: 1.00   Years: 30.00   Pack years: 30.00   Types: Cigarettes   Quit date: 02/21/1987   Years since quitting: 34.1  Smokeless Tobacco Never    Goals Met:  Independence with exercise equipment Exercise tolerated well No report of concerns or symptoms today Strength training completed today  Goals Unmet:  Not Applicable  Comments: Pt able to follow exercise prescription today without complaint.  Will continue to monitor for progression.    Dr. Emily Filbert is Medical Director for [place].  Dr. Ottie Glazier is Medical Director for [facility] Pulmonary Rehabilitation.

## 2021-04-05 ENCOUNTER — Other Ambulatory Visit: Payer: Self-pay

## 2021-04-05 DIAGNOSIS — Z955 Presence of coronary angioplasty implant and graft: Secondary | ICD-10-CM

## 2021-04-05 DIAGNOSIS — I214 Non-ST elevation (NSTEMI) myocardial infarction: Secondary | ICD-10-CM

## 2021-04-05 DIAGNOSIS — I252 Old myocardial infarction: Secondary | ICD-10-CM | POA: Diagnosis not present

## 2021-04-05 NOTE — Progress Notes (Signed)
Daily Session Note  Patient Details  Name: Dalya Maselli MRN: [id number] Date of Birth: 1945-08-27 Referring Provider:   Flowsheet Row Cardiac Rehab from 02/16/2021 in [facility] Cardiac and Pulmonary Rehab  Referring Provider Gollan       Encounter Date: 04/05/2021  Check In:  Session Check In - 04/05/21 1615       Check-In   Supervising physician immediately available to respond to emergencies See telemetry face sheet for immediately available ER MD    Location [facility]Card[facility] Pulmonary Rehab    Staff Present Birdie Sons, MPA, RN;Joseph Garnette [place], MS, ASCM CEP, Exercise Physiologist    Virtual Visit No    Medication changes reported     No    Fall or balance concerns reported    No    Tobacco Cessation No Change    Warm-up and Cool-down Performed on first and last piece of equipment    Resistance Training Performed Yes    VAD Patient? No    PAD/SET Patient? No      Pain Assessment   Currently in Pain? No/denies                Social History   Tobacco Use  Smoking Status Former   Packs/day: 1.00   Years: 30.00   Pack years: 30.00   Types: Cigarettes   Quit date: 02/21/1987   Years since quitting: 34.1  Smokeless Tobacco Never    Goals Met:  Independence with exercise equipment Exercise tolerated well No report of concerns or symptoms today Strength training completed today  Goals Unmet:  Not Applicable  Comments: Pt able to follow exercise prescription today without complaint.  Will continue to monitor for progression.    Dr. Emily Filbert is Medical Director for [place].  Dr. Ottie Glazier is Medical Director for [facility] [place] [facility] Pulmonary Rehabilitation.

## 2021-04-06 ENCOUNTER — Other Ambulatory Visit: Payer: Self-pay

## 2021-04-06 ENCOUNTER — Encounter: Payer: Medicare Other | Admitting: *Deleted

## 2021-04-06 DIAGNOSIS — I252 Old myocardial infarction: Secondary | ICD-10-CM | POA: Diagnosis not present

## 2021-04-06 DIAGNOSIS — Z955 Presence of coronary angioplasty implant and graft: Secondary | ICD-10-CM

## 2021-04-06 DIAGNOSIS — I214 Non-ST elevation (NSTEMI) myocardial infarction: Secondary | ICD-10-CM

## 2021-04-06 NOTE — Progress Notes (Signed)
Daily Session Note  Patient Details  Name: Liora Myles MRN: [id number] Date of Birth: 1945/09/25 Referring Provider:   Flowsheet Row Cardiac Rehab from 02/16/2021 in [facility] Cardiac and Pulmonary Rehab  Referring Provider Gollan       Encounter Date: 04/06/2021  Check In:  Session Check In - 04/06/21 1553       Check-In   Supervising physician immediately available to respond to emergencies See telemetry face sheet for immediately available ER MD    Location [facility]Cardiac & Pulmonary Rehab    Staff Present Renita Papa, RN BSN;Joseph [place]ov[place], RCP,RRT,BSRT;Jessica [place], [place], RCEP, CCRP, CCET    Virtual Visit No    Medication changes reported     No    Fall or balance concerns reported    No    Warm-up and Cool-down Performed on first and last piece of equipment    Resistance Training Performed Yes    VAD Patient? No    PAD/SET Patient? No      Pain Assessment   Currently in Pain? No/denies                Social History   Tobacco Use  Smoking Status Former   Packs/day: 1.00   Years: 30.00   Pack years: 30.00   Types: Cigarettes   Quit date: 02/21/1987   Years since quitting: 34.1  Smokeless Tobacco Never    Goals Met:  Independence with exercise equipment Exercise tolerated well No report of concerns or symptoms today Strength training completed today  Goals Unmet:  Not Applicable  Comments: Pt able to follow exercise prescription today without complaint.  Will continue to monitor for progression.    Dr. Emily Filbert is Medical Director for [place].  Dr. Ottie Glazier is Medical Director for [facility] Pulmonary Rehabilitation.

## 2021-04-08 NOTE — Progress Notes (Signed)
Cardiology Office Note  Date:  04/10/2021   ID:  Penny Mccullough, DOB 28-Aug-1945, MRN [id number]  PCP:  Pcp, No   Chief Complaint  Patient presents with   5-6 week follow up     Patient c/o pounding heartbeats when lying down at night and blood pressure running low even with exercise; during cardiac rehab her BP stays low. Medications reviewed by the patient verbally.     HPI:  Penny Mccullough is a 76 y.o. female with a hx of  CAD  remote CABG approximately 30 years ago (suspected to be a LIMA-LAD per history),  CVA,  HLD,  COPD /tobacco /chronic bronchitis Ejection fraction 45 to 50% Moderate MR, severely dilated left atrium NSTEMI s/p PCI to ostial and mid LAD 10/28/20.  Hard of hearing Who presents for follow-up of her coronary disease,  PCI to ostial and mid LAD, new atrial fibrillation (diagnosed December 2022)  Last seen in clinic by myself March 07, 2021 Atrial fibrillation in December 2022  Underwent cardioversion Mar 16, 2021, normal sinus rhythm restored  Maintaining NSR today Pounding in chest when laying down, does not feel it during the day Does not appreciate palpitations in the daytime Reports having dark chocolate was in the evenings  Reports recently started Jardiance  EKG personally reviewed by myself on todays visit Normal sinus rhythm, PVCs in a bigeminal pattern, rhythm strip showing resolution of PVCs Clinical exam with occasional ectopy  History reviewed Last seen by myself in clinic February 06, 2021 Noted to be in atrial fibrillation at that time, rate 119 bpm, asymptomatic Entresto was held, started on low-dose digoxin, metoprolol succinate increased, Entresto held, Lasix 40 continued  September 2022 NSTEMI s/p PCI to ostial and mid LAD 10/28/20.   Echo showed EF 45-50% with HK of basal to mid inferior and inferolateral wall.   remote CABG x 1, at which time she was told "they took a main artery from my chest," and appears to be a LIMA to LAD.      PMH:   has a past medical history of CAD (coronary artery disease), CHF (congestive heart failure) (HCC), COPD (chronic obstructive pulmonary disease) (HCC), CVA (cerebral vascular accident) (HCC), HLD (hyperlipidemia), Hypertension, and MI (myocardial infarction) (HCC) (10/2020).  PSH:    Past Surgical History:  Procedure Laterality Date   CARDIOVERSION N/A 03/16/2021   Procedure: CARDIOVERSION;  Surgeon: Antonieta Iba, MD;  Location: [facility] ORS;  Service: Cardiovascular;  Laterality: N/A;   CORONARY ARTERY BYPASS GRAFT  1989   CORONARY STENT INTERVENTION N/A 10/28/2020   Procedure: CORONARY STENT INTERVENTION;  Surgeon: Alwyn Pea, MD;  Location: [facility] INVASIVE CV LAB;  Service: Cardiovascular;  Laterality: N/A;   LEFT HEART CATH AND CORONARY ANGIOGRAPHY N/A 10/28/2020   Procedure: LEFT HEART CATH AND CORONARY ANGIOGRAPHY;  Surgeon: Alwyn Pea, MD;  Location: [facility] INVASIVE CV LAB;  Service: Cardiovascular;  Laterality: N/A;    Current Outpatient Medications  Medication Sig Dispense Refill   albuterol (VENTOLIN HFA) 108 (90 Base) MCG/ACT inhaler Inhale 2 puffs into the lungs every 6 (six) hours as needed for wheezing or shortness of breath. 8 g 0   apixaban (ELIQUIS) 5 MG TABS tablet Take 1 tablet (5 mg total) by mouth 2 (two) times daily. 90 tablet 3   atorvastatin (LIPITOR) 80 MG tablet Take 1 tablet (80 mg total) by mouth daily. 90 tablet 3   clopidogrel (PLAVIX) 75 MG tablet Take 1 tablet (75 mg total) by mouth daily.  92 tablet 3   digoxin (LANOXIN) 0.125 MG tablet Take 1 tablet (0.125 mg total) by mouth daily. 90 tablet 3   empagliflozin (JARDIANCE) 10 MG TABS tablet Take 10 mg by mouth daily.     furosemide (LASIX) 20 MG tablet Take 2 tablets (40 mg total) by mouth daily. 180 tablet 3   metoprolol succinate (TOPROL XL) 50 MG 24 hr tablet Take 1.5 tablets (75 mg total) by mouth daily. 140 tablet 3   nitroGLYCERIN (NITROSTAT) 0.4 MG SL tablet Place 1 tablet (0.4 mg  total) under the tongue every 5 (five) minutes as needed for chest pain. 25 tablet 2   potassium chloride (KLOR-CON) 10 MEQ tablet Take 2 tablets (20 mEq total) by mouth daily. 180 tablet 3   No current facility-administered medications for this visit.     Allergies:   Codeine   Social History:  The patient  reports that she quit smoking about 34 years ago. Her smoking use included cigarettes. She has a 30.00 pack-year smoking history. She has never used smokeless tobacco. She reports that she does not currently use alcohol. She reports that she does not use drugs.   Family History:   family history includes Hypertension in her mother.    Review of Systems: Review of Systems  Constitutional: Negative.   HENT: Negative.    Respiratory: Negative.    Cardiovascular:  Positive for palpitations.  Gastrointestinal: Negative.   Musculoskeletal: Negative.   Neurological: Negative.   Psychiatric/Behavioral: Negative.    All other systems reviewed and are negative.   PHYSICAL EXAM: VS:  BP 112/70 (BP Location: Left Arm, Patient Position: Sitting, Cuff Size: Normal)    Pulse (!) 59    Ht 5\' 1"  (1.549 m)    Wt 126 lb 4 oz (57.3 kg)    SpO2 95%    BMI 23.85 kg/m  , BMI Body mass index is 23.85 kg/m. Constitutional:  oriented to person, place, and time. No distress.  HENT:  Head: Grossly normal Eyes:  no discharge. No scleral icterus.  Neck: No JVD, no carotid bruits  Cardiovascular: Regular rate and rhythm, no murmurs appreciated Pulmonary/Chest: Clear to auscultation bilaterally, no wheezes or rails Abdominal: Soft.  no distension.  no tenderness.  Musculoskeletal: Normal range of motion Neurological:  normal muscle tone. Coordination normal. No atrophy Skin: Skin warm and dry Psychiatric: normal affect, pleasant   Recent Labs: 12/14/2020: ALT 16 01/22/2021: B Natriuretic Peptide 1,905.6; Magnesium 2.2 03/07/2021: BUN 15; Creatinine, Ser 0.94; Hemoglobin 13.5; Platelets 383;  Potassium 4.4; Sodium 138    Lipid Panel Lab Results  Component Value Date   CHOL 144 12/14/2020   HDL 43 12/14/2020   LDLCALC NOT CALCULATED 12/14/2020   TRIG 126 12/14/2020      Wt Readings from Last 3 Encounters:  04/10/21 126 lb 4 oz (57.3 kg)  03/21/21 123 lb 8 oz (56 kg)  03/16/21 128 lb (58.1 kg)     ASSESSMENT AND PLAN:  Problem List Items Addressed This Visit       Cardiology Problems   CAD (coronary artery disease)     Other   COPD (chronic obstructive pulmonary disease) (HCC)   Other Visit Diagnoses     Paroxysmal atrial fibrillation (HCC)    -  Primary   Chronic systolic heart failure (HCC)       Essential hypertension         Coronary disease with stable angina Prior PCI to ostial and mid LAD On Plavix Stay  on Eliquis On statin, goal LDL less than 70 No symptoms  Atrial fibrillation with RVR Appear to start December 2022 Underwent cardioversion January 2022, maintaining normal sinus rhythm We will check a digoxin level Stay on Plavix with Eliquis 5 twice daily  Palpitations/PVCs Recommend she try to cut back on the chocolate Asymptomatic with her PVCs during the office visit today If symptoms persist ZIO monitor could be ordered to estimate PVC burden   Total encounter time more than 30 minutes  Greater than 50% was spent in counseling and coordination of care with the patient    Signed, Dossie Arbour, M.D., Ph.D. [facility] [place], [place] [phone]

## 2021-04-10 ENCOUNTER — Other Ambulatory Visit: Payer: Self-pay

## 2021-04-10 ENCOUNTER — Encounter: Payer: Self-pay | Admitting: Cardiovascular Disease

## 2021-04-10 ENCOUNTER — Encounter: Payer: Medicare Other | Admitting: *Deleted

## 2021-04-10 ENCOUNTER — Ambulatory Visit (INDEPENDENT_AMBULATORY_CARE_PROVIDER_SITE_OTHER): Payer: Medicare Other | Admitting: Cardiovascular Disease

## 2021-04-10 VITALS — BP 112/70 | HR 59 | Ht 61.0 in | Wt 126.2 lb

## 2021-04-10 DIAGNOSIS — J449 Chronic obstructive pulmonary disease, unspecified: Secondary | ICD-10-CM

## 2021-04-10 DIAGNOSIS — I480 Paroxysmal atrial fibrillation: Secondary | ICD-10-CM | POA: Diagnosis not present

## 2021-04-10 DIAGNOSIS — R002 Palpitations: Secondary | ICD-10-CM | POA: Diagnosis not present

## 2021-04-10 DIAGNOSIS — I214 Non-ST elevation (NSTEMI) myocardial infarction: Secondary | ICD-10-CM

## 2021-04-10 DIAGNOSIS — I10 Essential (primary) hypertension: Secondary | ICD-10-CM

## 2021-04-10 DIAGNOSIS — I252 Old myocardial infarction: Secondary | ICD-10-CM | POA: Diagnosis not present

## 2021-04-10 DIAGNOSIS — Z79899 Other long term (current) drug therapy: Secondary | ICD-10-CM

## 2021-04-10 DIAGNOSIS — I5022 Chronic systolic (congestive) heart failure: Secondary | ICD-10-CM

## 2021-04-10 DIAGNOSIS — I25118 Atherosclerotic heart disease of native coronary artery with other forms of angina pectoris: Secondary | ICD-10-CM

## 2021-04-10 DIAGNOSIS — J432 Centrilobular emphysema: Secondary | ICD-10-CM

## 2021-04-10 DIAGNOSIS — Z955 Presence of coronary angioplasty implant and graft: Secondary | ICD-10-CM

## 2021-04-10 NOTE — Progress Notes (Signed)
Daily Session Note  Patient Details  Name: Kyana Aicher MRN: [id number] Date of Birth: [birth year] Referring Provider:   Flowsheet Row Cardiac Rehab from 02/16/2021 in [facility] Cardiac and Pulmonary Rehab  Referring Provider Gollan       Encounter Date: 04/10/2021  Check In:  Session Check In - 04/10/21 1751       Check-In   Supervising physician immediately available to respond to emergencies See telemetry face sheet for immediately available ER MD    Location [facility]-Cardiac & Pulmonary Rehab    Staff Present Justin Mend, RCP,RRT,BSRT;Mary Kellie Shropshire, RN, BSN, Kela Millin, BA, ACSM CEP, Exercise Physiologist    Virtual Visit No    Medication changes reported     No    Fall or balance concerns reported    No    Warm-up and Cool-down Performed on first and last piece of equipment    Resistance Training Performed Yes    VAD Patient? No    PAD/SET Patient? No      Pain Assessment   Currently in Pain? No/denies                Social History   Tobacco Use  Smoking Status Former   Packs/day: 1.00   Years: 30.00   Pack years: 30.00   Types: Cigarettes   Quit date: 02/21/1987   Years since quitting: 34.1  Smokeless Tobacco Never    Goals Met:  Independence with exercise equipment Exercise tolerated well No report of concerns or symptoms today Strength training completed today  Goals Unmet:  Not Applicable  Comments: Pt able to follow exercise prescription today without complaint.  Will continue to monitor for progression.    Dr. Emily Filbert is Medical Director for [place].  Dr. Ottie Glazier is Medical Director for [facility] Pulmonary Rehabilitation.

## 2021-04-10 NOTE — Patient Instructions (Addendum)
Medication Instructions:  No changes  If you need a refill on your cardiac medications before your next appointment, please call your pharmacy.   Lab work: Digoxin level, BMP  Testing/Procedures: No new testing needed  Follow-Up: At [facility], you and your health needs are our priority.  As part of our continuing mission to provide you with exceptional heart care, we have created designated Provider Care Teams.  These Care Teams include your primary Cardiologist (physician) and Advanced Practice Providers (APPs -  Physician Assistants and Nurse Practitioners) who all work together to provide you with the care you need, when you need it.  You will need a follow up appointment in 6 months  Providers on your designated Care Team:   Murray Hodgkins, NP Christell Faith, PA-C Cadence Kathlen Mody, [place]  COVID-19 Vaccine Information can be found at: [email or web address] For questions related to vaccine distribution or appointments, please email [email or web address] or call [phone].

## 2021-04-11 LAB — BASIC METABOLIC PANEL
BUN/Creatinine Ratio: 16 (ref 12–28)
BUN: 12 mg/dL (ref 8–27)
CO2: 25 mmol/L (ref 20–29)
Calcium: 9.2 mg/dL (ref 8.7–10.3)
Chloride: 105 mmol/L (ref 96–106)
Creatinine, Ser: 0.75 mg/dL (ref 0.57–1.00)
Glucose: 106 mg/dL — ABNORMAL HIGH (ref 70–99)
Potassium: 4.6 mmol/L (ref 3.5–5.2)
Sodium: 142 mmol/L (ref 134–144)
eGFR: 83 mL/min/{1.73_m2} (ref >59)

## 2021-04-11 LAB — DIGOXIN LEVEL: Digoxin, Serum: 0.7 ng/mL (ref 0.5–0.9)

## 2021-04-12 ENCOUNTER — Encounter: Payer: Self-pay | Admitting: *Deleted

## 2021-04-12 ENCOUNTER — Other Ambulatory Visit: Payer: Self-pay

## 2021-04-12 DIAGNOSIS — I252 Old myocardial infarction: Secondary | ICD-10-CM | POA: Diagnosis not present

## 2021-04-12 DIAGNOSIS — Z955 Presence of coronary angioplasty implant and graft: Secondary | ICD-10-CM

## 2021-04-12 DIAGNOSIS — I214 Non-ST elevation (NSTEMI) myocardial infarction: Secondary | ICD-10-CM

## 2021-04-12 NOTE — Progress Notes (Signed)
Cardiac Individual Treatment Plan  Patient Details  Name: Penny Mccullough MRN: [id number] Date of Birth: 06-12-1945 Referring Provider:   Flowsheet Row Cardiac Rehab from 02/16/2021 in [facility] Cardiac and Pulmonary Rehab  Referring Provider Gollan       Initial Encounter Date:  Flowsheet Row Cardiac Rehab from 02/16/2021 in [facility] Cardiac and Pulmonary Rehab  Date 02/16/21       Visit Diagnosis: NSTEMI (non-ST elevation myocardial infarction) [facility])  Patient's Home Medications on Admission:  Current Outpatient Medications:    albuterol (VENTOLIN HFA) 108 (90 Base) MCG/ACT inhaler, Inhale 2 puffs into the lungs every 6 (six) hours as needed for wheezing or shortness of breath., Disp: 8 g, Rfl: 0   apixaban (ELIQUIS) 5 MG TABS tablet, Take 1 tablet (5 mg total) by mouth 2 (two) times daily., Disp: 90 tablet, Rfl: 3   atorvastatin (LIPITOR) 80 MG tablet, Take 1 tablet (80 mg total) by mouth daily., Disp: 90 tablet, Rfl: 3   clopidogrel (PLAVIX) 75 MG tablet, Take 1 tablet (75 mg total) by mouth daily., Disp: 92 tablet, Rfl: 3   digoxin (LANOXIN) 0.125 MG tablet, Take 1 tablet (0.125 mg total) by mouth daily., Disp: 90 tablet, Rfl: 3   empagliflozin (JARDIANCE) 10 MG TABS tablet, Take 10 mg by mouth daily., Disp: , Rfl:    furosemide (LASIX) 20 MG tablet, Take 2 tablets (40 mg total) by mouth daily., Disp: 180 tablet, Rfl: 3   metoprolol succinate (TOPROL XL) 50 MG 24 hr tablet, Take 1.5 tablets (75 mg total) by mouth daily., Disp: 140 tablet, Rfl: 3   nitroGLYCERIN (NITROSTAT) 0.4 MG SL tablet, Place 1 tablet (0.4 mg total) under the tongue every 5 (five) minutes as needed for chest pain., Disp: 25 tablet, Rfl: 2   potassium chloride (KLOR-CON) 10 MEQ tablet, Take 2 tablets (20 mEq total) by mouth daily., Disp: 180 tablet, Rfl: 3  Past Medical History: Past Medical History:  Diagnosis Date   CAD (coronary artery disease)    CHF (congestive heart failure) (HCC)    COPD (chronic obstructive  pulmonary disease) (HCC)    CVA (cerebral vascular accident) ([place])    HLD (hyperlipidemia)    Hypertension    MI (myocardial infarction) ([place]) 10/2020    Tobacco Use: Social History   Tobacco Use  Smoking Status Former   Packs/day: 1.00   Years: 30.00   Pack years: 30.00   Types: Cigarettes   Quit date: 02/21/1987   Years since quitting: 34.1  Smokeless Tobacco Never    Labs: Recent Review Flowsheet Data     Labs for ITP Cardiac and Pulmonary Rehab Latest Ref Rng & Units 10/27/2020 12/14/2020   Cholestrol 0 - 200 mg/dL 270(H) 144   LDLCALC 0 - 99 mg/dL 199(H) NOT CALCULATED    HDL >40 mg/dL 47 43   Trlycerides <150 mg/dL 120 126   Hemoglobin A1c 4.8 - 5.6 % 5.2 -        Exercise Target Goals: Exercise Program Goal: Individual exercise prescription set using results from initial 6 min walk test and THRR while considering  patients activity barriers and safety.   Exercise Prescription Goal: Initial exercise prescription builds to 30-45 minutes a day of aerobic activity, 2-3 days per week.  Home exercise guidelines will be given to patient during program as part of exercise prescription that the participant will acknowledge.   Education: Aerobic Exercise: - Group verbal and visual presentation on the components of exercise prescription. Introduces F.I.T.T principle from ACSM  for exercise prescriptions.  Reviews F.I.T.T. principles of aerobic exercise including progression. Written material given at graduation.   Education: Resistance Exercise: - Group verbal and visual presentation on the components of exercise prescription. Introduces F.I.T.T principle from ACSM for exercise prescriptions  Reviews F.I.T.T. principles of resistance exercise including progression. Written material given at graduation.    Education: Exercise & Equipment Safety: - Individual verbal instruction and demonstration of equipment use and safety with use of the equipment. Flowsheet Row Cardiac  Rehab from 02/16/2021 in [facility] Cardiac and Pulmonary Rehab  Date 02/16/21  Educator AS  Instruction Review Code 1- Verbalizes Understanding       Education: Exercise Physiology & General Exercise Guidelines: - Group verbal and written instruction with models to review the exercise physiology of the cardiovascular system and associated critical values. Provides general exercise guidelines with specific guidelines to those with heart or lung disease.  Flowsheet Row Cardiac Rehab from 02/16/2021 in [facility] Cardiac and Pulmonary Rehab  Education need identified 02/16/21       Education: Flexibility, Balance, Mind/Body Relaxation: - Group verbal and visual presentation with interactive activity on the components of exercise prescription. Introduces F.I.T.T principle from ACSM for exercise prescriptions. Reviews F.I.T.T. principles of flexibility and balance exercise training including progression. Also discusses the mind body connection.  Reviews various relaxation techniques to help reduce and manage stress (i.e. Deep breathing, progressive muscle relaxation, and visualization). Balance handout provided to take home. Written material given at graduation.   Activity Barriers & Risk Stratification:   6 Minute Walk:  6 Minute Walk     Row Name 02/16/21 1511         6 Minute Walk   Phase Initial     Distance 1070 feet     Walk Time 6 minutes     # of Rest Breaks 0     MPH 2     METS 2.48     RPE 13     Perceived Dyspnea  2     VO2 Peak 8.67     Symptoms Yes (comment)     Comments SOB     Resting HR 105 bpm     Resting BP 122/72     Resting Oxygen Saturation  98 %     Exercise Oxygen Saturation  during 6 min walk 98 %     Max Ex. HR 119 bpm     Max Ex. BP 134/70     2 Minute Post BP 112/68              Oxygen Initial Assessment:   Oxygen Re-Evaluation:   Oxygen Discharge (Final Oxygen Re-Evaluation):   Initial Exercise Prescription:  Initial Exercise Prescription  - 02/16/21 1500       Date of Initial Exercise RX and Referring Provider   Date 02/16/21    Referring Provider Gollan      Oxygen   Maintain Oxygen Saturation 88% or higher      Treadmill   MPH 1.8    Grade 0.5    Minutes 15    METs 2.5      NuStep   Level 1    SPM 80    Minutes 15    METs 2.4      REL-XR   Level 1    Speed 50    Minutes 15    METs 2.4      Biostep-RELP   Level 1    SPM 50    Minutes 15  METs 2      Prescription Details   Frequency (times per week) 3    Duration Progress to 30 minutes of continuous aerobic without signs/symptoms of physical distress      Intensity   THRR 40-80% of Max Heartrate 121-138    Ratings of Perceived Exertion 11-13    Perceived Dyspnea 0-4      Resistance Training   Training Prescription Yes    Weight 3 lb    Reps 10-15             Perform Capillary Blood Glucose checks as needed.  Exercise Prescription Changes:   Exercise Prescription Changes     Row Name 02/16/21 1500 02/27/21 1400 03/13/21 1200 03/27/21 1500 03/30/21 1600     Response to Exercise   Blood Pressure (Admit) 122/72 124/62 102/58 94/58 --   Blood Pressure (Exercise) 134/70 126/64 130/64 100/60 --   Blood Pressure (Exit) 112/68 108/68 100/60 90/58 --   Heart Rate (Admit) 105 bpm 105 bpm 89 bpm 59 bpm --   Heart Rate (Exercise) 119 bpm 128 bpm 124 bpm 68 bpm --   Heart Rate (Exit) 108 bpm 100 bpm 113 bpm 56 bpm --   Oxygen Saturation (Admit) 98 % -- -- -- --   Oxygen Saturation (Exercise) 98 % -- -- -- --   Rating of Perceived Exertion (Exercise) 13 15 15 13  --   Perceived Dyspnea (Exercise) 2 -- -- -- --   Symptoms SOB -- none none --   Comments -- second day -- -- --   Duration -- Progress to 30 minutes of  aerobic without signs/symptoms of physical distress Continue with 30 min of aerobic exercise without signs/symptoms of physical distress. Continue with 30 min of aerobic exercise without signs/symptoms of physical distress. --    Intensity -- THRR unchanged THRR unchanged THRR unchanged --     Progression   Progression -- Continue to progress workloads to maintain intensity without signs/symptoms of physical distress. Continue to progress workloads to maintain intensity without signs/symptoms of physical distress. Continue to progress workloads to maintain intensity without signs/symptoms of physical distress. --   Average METs -- 1.75 2.37 1.9 --     Resistance Training   Training Prescription -- Yes Yes Yes --   Weight -- 3 lb 4 lb 4 lb --   Reps -- 10-15 10-15 10-15 --     Interval Training   Interval Training -- -- No No --     Treadmill   MPH -- 1.2 -- -- --   Grade -- 0.5 -- -- --   Minutes -- 15 -- -- --   METs -- 2 -- -- --     NuStep   Level -- 1 4 4  --   SPM -- 80 -- -- --   Minutes -- 15 15 15  --   METs -- 1.5 2.5 1.7 --     REL-XR   Level -- -- 2 -- --   Minutes -- -- 15 -- --   METs -- -- 3.8 -- --     Track   Laps -- -- 20 21 --   Minutes -- -- 15 15 --   METs -- -- 2.09 2.14 --     Home Exercise Plan   Plans to continue exercise at -- -- -- -- Home (comment)  walking, household items for weights   Frequency -- -- -- -- Add 2 additional days to program exercise sessions.   Initial  Home Exercises Provided -- -- -- -- 03/30/21     Oxygen   Maintain Oxygen Saturation -- -- 88% or higher 88% or higher 88% or higher    Row Name 04/11/21 1000             Response to Exercise   Blood Pressure (Admit) 104/64       Blood Pressure (Exit) 90/56       Heart Rate (Admit) 70 bpm       Heart Rate (Exercise) 72 bpm       Heart Rate (Exit) 57 bpm       Oxygen Saturation (Admit) 95 %       Oxygen Saturation (Exercise) 94 %       Oxygen Saturation (Exit) 95 %       Rating of Perceived Exertion (Exercise) 13       Symptoms none       Duration Continue with 30 min of aerobic exercise without signs/symptoms of physical distress.       Intensity THRR unchanged         Progression    Progression Continue to progress workloads to maintain intensity without signs/symptoms of physical distress.       Average METs 2.24         Resistance Training   Training Prescription Yes       Weight 4 lb       Reps 10-15         Interval Training   Interval Training No         NuStep   Level 4       Minutes 15       METs 2.1         REL-XR   Level 3       Minutes 15       METs 2.3         Track   Laps 26       Minutes 15       METs 2.41         Home Exercise Plan   Plans to continue exercise at Home (comment)  walking, household items for weights       Frequency Add 2 additional days to program exercise sessions.       Initial Home Exercises Provided 03/30/21         Oxygen   Maintain Oxygen Saturation 88% or higher                Exercise Comments:   Exercise Comments     Row Name 02/22/21 1603 03/20/21 1609         Exercise Comments First full day of exercise!  Patient was oriented to gym and equipment including functions, settings, policies, and procedures.  Patient's individual exercise prescription and treatment plan were reviewed.  All starting workloads were established based on the results of the 6 minute walk test done at initial orientation visit.  The plan for exercise progression was also introduced and progression will be customized based on patient's performance and goals. Pt had a successful cardioversion on 03/16/21. Dr. Rockey Situ cleared her to resume cardiac rehab/exercise today. Patient able to follow exercise prescription today without complaint.  Will continue to monitor for progression.               Exercise Goals and Review:   Exercise Goals     Row Name 02/16/21 [phone]  Exercise Goals   Increase Physical Activity Yes       Intervention Provide advice, education, support and counseling about physical activity/exercise needs.;Develop an individualized exercise prescription for aerobic and resistive training based on  initial evaluation findings, risk stratification, comorbidities and participant's personal goals.       Expected Outcomes Short Term: Attend rehab on a regular basis to increase amount of physical activity.;Long Term: Add in home exercise to make exercise part of routine and to increase amount of physical activity.;Long Term: Exercising regularly at least 3-5 days a week.       Increase Strength and Stamina Yes       Intervention Provide advice, education, support and counseling about physical activity/exercise needs.;Develop an individualized exercise prescription for aerobic and resistive training based on initial evaluation findings, risk stratification, comorbidities and participant's personal goals.       Expected Outcomes Short Term: Increase workloads from initial exercise prescription for resistance, speed, and METs.;Short Term: Perform resistance training exercises routinely during rehab and add in resistance training at home;Long Term: Improve cardiorespiratory fitness, muscular endurance and strength as measured by increased METs and functional capacity ( )       Able to understand and use rate of perceived exertion (RPE) scale Yes       Intervention Provide education and explanation on how to use RPE scale       Expected Outcomes Short Term: Able to use RPE daily in rehab to express subjective intensity level;Long Term:  Able to use RPE to guide intensity level when exercising independently       Able to understand and use Dyspnea scale Yes       Intervention Provide education and explanation on how to use Dyspnea scale       Expected Outcomes Short Term: Able to use Dyspnea scale daily in rehab to express subjective sense of shortness of breath during exertion;Long Term: Able to use Dyspnea scale to guide intensity level when exercising independently       Knowledge and understanding of Target Heart Rate Range (THRR) Yes       Intervention Provide education and explanation of THRR  including how the numbers were predicted and where they are located for reference       Expected Outcomes Short Term: Able to state/look up THRR;Short Term: Able to use daily as guideline for intensity in rehab;Long Term: Able to use THRR to govern intensity when exercising independently       Able to check pulse independently Yes       Intervention Provide education and demonstration on how to check pulse in carotid and radial arteries.;Review the importance of being able to check your own pulse for safety during independent exercise       Expected Outcomes Short Term: Able to explain why pulse checking is important during independent exercise;Long Term: Able to check pulse independently and accurately       Understanding of Exercise Prescription Yes       Intervention Provide education, explanation, and written materials on patient's individual exercise prescription       Expected Outcomes Short Term: Able to explain program exercise prescription;Long Term: Able to explain home exercise prescription to exercise independently                Exercise Goals Re-Evaluation :  Exercise Goals Re-Evaluation     Row Name 02/22/21 1603 02/27/21 1443 03/13/21 1216 03/27/21 1514 03/27/21 1522     Exercise Goal Re-Evaluation  Exercise Goals Review Increase Physical Activity;Able to understand and use rate of perceived exertion (RPE) scale;Knowledge and understanding of Target Heart Rate Range (THRR);Understanding of Exercise Prescription;Increase Strength and Stamina;Able to understand and use Dyspnea scale;Able to check pulse independently Increase Physical Activity;Increase Strength and Stamina Increase Physical Activity;Increase Strength and Stamina Increase Physical Activity;Increase Strength and Stamina --   Comments Reviewed RPE and dyspnea scales, THR and program prescription with pt today.  Pt voiced understanding and was given a copy of goals to take home. Kennadi has done well in her first  sessions.  She did better walking track than TM.  We will continue to monitor progress. Malyssa is doing well in rehab. She tolerates the track better and was able to complete 20 laps! She also increased to level 4 on the T4 Nustep and increased her handweights to 4lbs. Will continue to monitor. Ekam continues to work at level 4 and has reached 21 laps on the track. She attends consistently and reaches THR range.   Expected Outcomes Short: Use RPE daily to regulate intensity. Long: Follow program prescription in THR. Short:  attend consistently Long:  improve overall stamina Short: Continue building up laps on the track Long: Continue to increase overall MET level -- Short: maintain consistent attendance Long:  continue to build stamina    Row Name 03/30/21 1709 04/11/21 1044           Exercise Goal Re-Evaluation   Exercise Goals Review Increase Physical Activity;Increase Strength and Stamina;Understanding of Exercise Prescription Increase Physical Activity;Increase Strength and Stamina;Understanding of Exercise Prescription      Comments Reviewed home exercise with pt today.  Pt plans to walk and do weights for exercise.  Patient has been using canned foods for weights and I encouraged her to buy dumbells as she has been doing 4 lbs at rehab. Reviewed THR, pulse, RPE, sign and symptoms, pulse oximetery and when to call 911 or MD.  Also discussed weather considerations and indoor options.  Pt voiced understanding. Pam is doing well in rehab.  She is up to 26 laps on the track and on level 3 for the XR.  We will conitnue to monitor her progress.      Expected Outcomes Short: Add on 1 day of exercise at home Long: Exercise independently at home at appropriate prescription Short: Continue to increase laps on track. Long: Continue to improve stamina               Discharge Exercise Prescription (Final Exercise Prescription Changes):  Exercise Prescription Changes - 04/11/21 1000       Response to  Exercise   Blood Pressure (Admit) 104/64    Blood Pressure (Exit) 90/56    Heart Rate (Admit) 70 bpm    Heart Rate (Exercise) 72 bpm    Heart Rate (Exit) 57 bpm    Oxygen Saturation (Admit) 95 %    Oxygen Saturation (Exercise) 94 %    Oxygen Saturation (Exit) 95 %    Rating of Perceived Exertion (Exercise) 13    Symptoms none    Duration Continue with 30 min of aerobic exercise without signs/symptoms of physical distress.    Intensity THRR unchanged      Progression   Progression Continue to progress workloads to maintain intensity without signs/symptoms of physical distress.    Average METs 2.24      Resistance Training   Training Prescription Yes    Weight 4 lb    Reps 10-15  Interval Training   Interval Training No      NuStep   Level 4    Minutes 15    METs 2.1      REL-XR   Level 3    Minutes 15    METs 2.3      Track   Laps 26    Minutes 15    METs 2.41      Home Exercise Plan   Plans to continue exercise at Home (comment)   walking, household items for weights   Frequency Add 2 additional days to program exercise sessions.    Initial Home Exercises Provided 03/30/21      Oxygen   Maintain Oxygen Saturation 88% or higher             Nutrition:  Target Goals: Understanding of nutrition guidelines, daily intake of sodium '1500mg'$ , cholesterol '200mg'$ , calories 30% from fat and 7% or less from saturated fats, daily to have 5 or more servings of fruits and vegetables.  Education: All About Nutrition: -Group instruction provided by verbal, written material, interactive activities, discussions, models, and posters to present general guidelines for heart healthy nutrition including fat, fiber, MyPlate, the role of sodium in heart healthy nutrition, utilization of the nutrition label, and utilization of this knowledge for meal planning. Follow up email sent as well. Written material given at graduation. Flowsheet Row Cardiac Rehab from 02/16/2021 in [facility]  Cardiac and Pulmonary Rehab  Education need identified 02/16/21       Biometrics:  Pre Biometrics - 02/16/21 1519       Pre Biometrics   Height 5' (1.524 m)    Weight 131 lb 14.4 oz (59.8 kg)    BMI (Calculated) 25.76    Single Leg Stand 6.33 seconds              Nutrition Therapy Plan and Nutrition Goals:  Nutrition Therapy & Goals - 03/01/21 1651       Nutrition Therapy   RD appointment deferred Yes   Pt declined consultation with RD, will continue to follow up     Personal Nutrition Goals   Nutrition Goal Pt declined consultation with RD, will continue to follow up             Nutrition Assessments:  MEDIFICTS Score Key: ?70 Need to make dietary changes  40-70 Heart Healthy Diet ? 40 Therapeutic Level Cholesterol Diet  Flowsheet Row Cardiac Rehab from 02/16/2021 in [facility] Cardiac and Pulmonary Rehab  Picture Your Plate Total Score on Admission 59      Picture Your Plate Scores: <79 Unhealthy dietary pattern with much room for improvement. 41-50 Dietary pattern unlikely to meet recommendations for good health and room for improvement. 51-60 More healthful dietary pattern, with some room for improvement.  >60 Healthy dietary pattern, although there may be some specific behaviors that could be improved.    Nutrition Goals Re-Evaluation:  Nutrition Goals Re-Evaluation     [place] Name 03/30/21 1719             Goals   Nutrition Goal Pt declined consultation with RD, will continue to follow up       Comment Patient still declined wanting to talk to RD at this time.                Nutrition Goals Discharge (Final Nutrition Goals Re-Evaluation):  Nutrition Goals Re-Evaluation - 03/30/21 1719       Goals   Nutrition Goal Pt declined consultation with  RD, will continue to follow up    Comment Patient still declined wanting to talk to RD at this time.             Psychosocial: Target Goals: Acknowledge presence or absence of significant  depression and/or stress, maximize coping skills, provide positive support system. Participant is able to verbalize types and ability to use techniques and skills needed for reducing stress and depression.   Education: Stress, Anxiety, and Depression - Group verbal and visual presentation to define topics covered.  Reviews how body is impacted by stress, anxiety, and depression.  Also discusses healthy ways to reduce stress and to treat/manage anxiety and depression.  Written material given at graduation.   Education: Sleep Hygiene -Provides group verbal and written instruction about how sleep can affect your health.  Define sleep hygiene, discuss sleep cycles and impact of sleep habits. Review good sleep hygiene tips.    Initial Review & Psychosocial Screening:  Initial Psych Review & Screening - 01/05/21 0958       Initial Review   Current issues with None Identified      Family Dynamics   Good Support System? Yes    Comments She can look to her daughter and sister for support. Patient has a positive outlook onher health.      Barriers   Psychosocial barriers to participate in program There are no identifiable barriers or psychosocial needs.;The patient should benefit from training in stress management and relaxation.      Screening Interventions   Interventions To provide support and resources with identified psychosocial needs;Encouraged to exercise;Provide feedback about the scores to participant    Expected Outcomes Short Term goal: Utilizing psychosocial counselor, staff and physician to assist with identification of specific Stressors or current issues interfering with healing process. Setting desired goal for each stressor or current issue identified.;Long Term Goal: Stressors or current issues are controlled or eliminated.;Short Term goal: Identification and review with participant of any Quality of Life or Depression concerns found by scoring the questionnaire.;Long Term goal:  The participant improves quality of Life and PHQ9 Scores as seen by post scores and/or verbalization of changes             Quality of Life Scores:   Quality of Life - 02/16/21 1520       Quality of Life   Select Quality of Life      Quality of Life Scores   Health/Function Pre 24.04 %    Socioeconomic Pre 30 %    Psych/Spiritual Pre 25.71 %    Family Pre 30 %    GLOBAL Pre 26.34 %            Scores of 19 and below usually indicate a poorer quality of life in these areas.  A difference of  2-3 points is a clinically meaningful difference.  A difference of 2-3 points in the total score of the Quality of Life Index has been associated with significant improvement in overall quality of life, self-image, physical symptoms, and general health in studies assessing change in quality of life.  PHQ-9: Recent Review Flowsheet Data     Depression screen [facility] 2/9 02/16/2021 01/24/2021   Decreased Interest 1 0   Down, Depressed, Hopeless 1 1   PHQ - 2 Score 2 1   Trouble concentrating 1 -   Moving slowly or fidgety/restless 1 -   Difficult doing work/chores Not difficult at all -      Interpretation of Total Score  Total Score Depression Severity:  1-4 = Minimal depression, 5-9 = Mild depression, 10-14 = Moderate depression, 15-19 = Moderately severe depression, 20-27 = Severe depression   Psychosocial Evaluation and Intervention:  Psychosocial Evaluation - 01/05/21 1000       Psychosocial Evaluation & Interventions   Interventions Encouraged to exercise with the program and follow exercise prescription;Relaxation education;Stress management education    Comments She can look to her daughter and sister for support. Patient has a positive outlook onher health.    Expected Outcomes Short: Start [facility] to help with mood. Long: Maintain a healthy mental state    Continue Psychosocial Services  Follow up required by staff             Psychosocial Re-Evaluation:   Psychosocial Re-Evaluation     [place] Name 03/30/21 1631             Psychosocial Re-Evaluation   Current issues with None Identified       Comments Pam is doing well mentally. She denies any big stressors and has been doing well. Her daughter is her biggest support. Sleep has been ok except for the last week as she hasn't been feeling the best.  She feels better now and has been enjoying the program so far.       Expected Outcomes Short: Continue attendance with rehab Long: Utilize exercise for stress management and maintain positive attitude       Interventions Encouraged to attend Cardiac Rehabilitation for the exercise       Continue Psychosocial Services  Follow up required by staff                Psychosocial Discharge (Final Psychosocial Re-Evaluation):  Psychosocial Re-Evaluation - 03/30/21 1631       Psychosocial Re-Evaluation   Current issues with None Identified    Comments Pam is doing well mentally. She denies any big stressors and has been doing well. Her daughter is her biggest support. Sleep has been ok except for the last week as she hasn't been feeling the best.  She feels better now and has been enjoying the program so far.    Expected Outcomes Short: Continue attendance with rehab Long: Utilize exercise for stress management and maintain positive attitude    Interventions Encouraged to attend Cardiac Rehabilitation for the exercise    Continue Psychosocial Services  Follow up required by staff             Vocational Rehabilitation: Provide vocational rehab assistance to qualifying candidates.   Vocational Rehab Evaluation & Intervention:   Education: Education Goals: Education classes will be provided on a variety of topics geared toward better understanding of heart health and risk factor modification. Participant will state understanding/return demonstration of topics presented as noted by education test scores.  Learning Barriers/Preferences:   Learning Barriers/Preferences - 01/05/21 0957       Learning Barriers/Preferences   Learning Barriers Hearing    Learning Preferences None             General Cardiac Education Topics:  AED/CPR: - Group verbal and written instruction with the use of models to demonstrate the basic use of the AED with the basic ABC's of resuscitation.   Anatomy and Cardiac Procedures: - Group verbal and visual presentation and models provide information about basic cardiac anatomy and function. Reviews the testing methods done to diagnose heart disease and the outcomes of the test results. Describes the treatment choices: Medical Management, Angioplasty, or Coronary Bypass Surgery  for treating various heart conditions including Myocardial Infarction, Angina, Valve Disease, and Cardiac Arrhythmias.  Written material given at graduation.   Medication Safety: - Group verbal and visual instruction to review commonly prescribed medications for heart and lung disease. Reviews the medication, class of the drug, and side effects. Includes the steps to properly store meds and maintain the prescription regimen.  Written material given at graduation.   Intimacy: - Group verbal instruction through game format to discuss how heart and lung disease can affect sexual intimacy. Written material given at graduation..   Know Your Numbers and Heart Failure: - Group verbal and visual instruction to discuss disease risk factors for cardiac and pulmonary disease and treatment options.  Reviews associated critical values for Overweight/Obesity, Hypertension, Cholesterol, and Diabetes.  Discusses basics of heart failure: signs/symptoms and treatments.  Introduces Heart Failure Zone chart for action plan for heart failure.  Written material given at graduation. Flowsheet Row Cardiac Rehab from 02/16/2021 in [facility] Cardiac and Pulmonary Rehab  Education need identified 02/16/21       Infection Prevention: - Provides  verbal and written material to individual with discussion of infection control including proper hand washing and proper equipment cleaning during exercise session. Flowsheet Row Cardiac Rehab from 02/16/2021 in [facility] Cardiac and Pulmonary Rehab  Date 02/16/21  Educator AS  Instruction Review Code 1- Verbalizes Understanding       Falls Prevention: - Provides verbal and written material to individual with discussion of falls prevention and safety. Flowsheet Row Cardiac Rehab from 02/16/2021 in [facility] Cardiac and Pulmonary Rehab  Date 02/16/21  Educator AS  Instruction Review Code 1- Verbalizes Understanding       Other: -Provides group and verbal instruction on various topics (see comments)   Knowledge Questionnaire Score:   Core Components/Risk Factors/Patient Goals at Admission:  Personal Goals and Risk Factors at Admission - 02/16/21 1527       Core Components/Risk Factors/Patient Goals on Admission    Weight Management Yes;Weight Maintenance    Intervention Weight Management: Develop a combined nutrition and exercise program designed to reach desired caloric intake, while maintaining appropriate intake of nutrient and fiber, sodium and fats, and appropriate energy expenditure required for the weight goal.;Weight Management: Provide education and appropriate resources to help participant work on and attain dietary goals.;Weight Management/Obesity: Establish reasonable short term and long term weight goals.    Expected Outcomes Short Term: Continue to assess and modify interventions until short term weight is achieved;Long Term: Adherence to nutrition and physical activity/exercise program aimed toward attainment of established weight goal;Weight Maintenance: Understanding of the daily nutrition guidelines, which includes 25-35% calories from fat, 7% or less cal from saturated fats, less than $RemoveB'200mg'ijXYEfyG$  cholesterol, less than 1.5gm of sodium, & 5 or more servings of fruits and vegetables  daily;Understanding recommendations for meals to include 15-35% energy as protein, 25-35% energy from fat, 35-60% energy from carbohydrates, less than $RemoveB'200mg'WWRuIdPg$  of dietary cholesterol, 20-35 gm of total fiber daily;Understanding of distribution of calorie intake throughout the day with the consumption of 4-5 meals/snacks    Hypertension Yes    Intervention Provide education on lifestyle modifcations including regular physical activity/exercise, weight management, moderate sodium restriction and increased consumption of fresh fruit, vegetables, and low fat dairy, alcohol moderation, and smoking cessation.;Monitor prescription use compliance.    Expected Outcomes Short Term: Continued assessment and intervention until BP is < 140/72mm HG in hypertensive participants. < 130/76mm HG in hypertensive participants with diabetes, heart failure or chronic kidney disease.;Long Term:  Maintenance of blood pressure at goal levels.    Lipids Yes    Intervention Provide education and support for participant on nutrition & aerobic/resistive exercise along with prescribed medications to achieve LDL '70mg'$ , HDL >$Remo'40mg'GOrCd$ .    Expected Outcomes Short Term: Participant states understanding of desired cholesterol values and is compliant with medications prescribed. Participant is following exercise prescription and nutrition guidelines.;Long Term: Cholesterol controlled with medications as prescribed, with individualized exercise RX and with personalized nutrition plan. Value goals: LDL < $Rem'70mg'Qoxz$ , HDL > 40 mg.             Education:Diabetes - Individual verbal and written instruction to review signs/symptoms of diabetes, desired ranges of glucose level fasting, after meals and with exercise. Acknowledge that pre and post exercise glucose checks will be done for 3 sessions at entry of program.   Core Components/Risk Factors/Patient Goals Review:   Goals and Risk Factor Review     Row Name 03/30/21 1628             Core  Components/Risk Factors/Patient Goals Review   Personal Goals Review Weight Management/Obesity;Hypertension;Lipids       Review Pam is doing well. She is checking her weight at home and says it stays consistent like her weights do here at rehab. She knows to look for any abormal weight loss or weight gain. She got lipids checked last in October and believes she is due for a re-check in the next couple of months. She checks her BP at home and it typically runs in 110s-120s/60s, compared to rehab she tends to run low but asymptomatic. I told her to bring her cuff into rehab so we can check to see how comparable the numbers are. She is taking all her medications as directed.       Expected Outcomes Short: Continue checking weight and BP at home Long: Continue to manage lifestyle risk factors                Core Components/Risk Factors/Patient Goals at Discharge (Final Review):   Goals and Risk Factor Review - 03/30/21 1628       Core Components/Risk Factors/Patient Goals Review   Personal Goals Review Weight Management/Obesity;Hypertension;Lipids    Review Pam is doing well. She is checking her weight at home and says it stays consistent like her weights do here at rehab. She knows to look for any abormal weight loss or weight gain. She got lipids checked last in October and believes she is due for a re-check in the next couple of months. She checks her BP at home and it typically runs in 110s-120s/60s, compared to rehab she tends to run low but asymptomatic. I told her to bring her cuff into rehab so we can check to see how comparable the numbers are. She is taking all her medications as directed.    Expected Outcomes Short: Continue checking weight and BP at home Long: Continue to manage lifestyle risk factors             ITP Comments:  ITP Comments     Row Name 01/05/21 0957 02/16/21 1537 02/22/21 1603 03/15/21 0814 03/20/21 1609   ITP Comments Virtual Visit completed. Patient informed  on EP and RD appointment and 6 Minute walk test. Patient also informed of patient health questionnaires on My Chart. Patient Verbalizes understanding. Visit diagnosis can be found in [facility] 10/27/2020. Completed 6MWT and gym orientation. Initial ITP created and sent for review to Dr. Emily Filbert, Medical Director. First  full day of exercise!  Patient was oriented to gym and equipment including functions, settings, policies, and procedures.  Patient's individual exercise prescription and treatment plan were reviewed.  All starting workloads were established based on the results of the 6 minute walk test done at initial orientation visit.  The plan for exercise progression was also introduced and progression will be customized based on patient's performance and goals. 30 Day review completed. Medical Director ITP review done, changes made as directed, and signed approval by Medical Director. Pt had a successful cardioversion on 03/16/21. Dr. Rockey Situ cleared her to resume cardiac rehab/exercise today. Patient able to follow exercise prescription today without complaint.  Will continue to monitor for progression.    [place] Name 04/12/21 0738           ITP Comments 30 Day review completed. Medical Director ITP review done, changes made as directed, and signed approval by Medical Director.                Comments:

## 2021-04-12 NOTE — Progress Notes (Signed)
Daily Session Note  Patient Details  Name: Kadelyn Dimascio MRN: [id number] Date of Birth: 11/21/1945 Referring Provider:   Flowsheet Row Cardiac Rehab from 02/16/2021 in [facility] Cardiac and Pulmonary Rehab  Referring Provider Gollan       Encounter Date: 04/12/2021  Check In:  Session Check In - 04/12/21 1613       Check-In   Supervising physician immediately available to respond to emergencies See telemetry face sheet for immediately available ER MD    Location [facility]-Cardiac & Pulmonary Rehab    Staff Present Birdie Sons, MPA, Nino Glow, MS, ASCM CEP, Exercise Physiologist;Joseph Tessie Fass, [place]    Virtual Visit No    Medication changes reported     No    Fall or balance concerns reported    No    Warm-up and Cool-down Performed on first and last piece of equipment    Resistance Training Performed Yes    VAD Patient? No    PAD/SET Patient? No      Pain Assessment   Currently in Pain? No/denies                Social History   Tobacco Use  Smoking Status Former   Packs/day: 1.00   Years: 30.00   Pack years: 30.00   Types: Cigarettes   Quit date: 02/21/1987   Years since quitting: 34.1  Smokeless Tobacco Never    Goals Met:  Independence with exercise equipment Exercise tolerated well No report of concerns or symptoms today Strength training completed today  Goals Unmet:  Not Applicable  Comments: Pt able to follow exercise prescription today without complaint.  Will continue to monitor for progression.    Dr. Emily Filbert is Medical Director for [place].  Dr. Ottie Glazier is Medical Director for [facility] Pulmonary Rehabilitation.

## 2021-04-13 ENCOUNTER — Other Ambulatory Visit: Payer: Self-pay

## 2021-04-13 ENCOUNTER — Encounter: Payer: Medicare Other | Admitting: *Deleted

## 2021-04-13 DIAGNOSIS — I252 Old myocardial infarction: Secondary | ICD-10-CM | POA: Diagnosis not present

## 2021-04-13 DIAGNOSIS — Z955 Presence of coronary angioplasty implant and graft: Secondary | ICD-10-CM

## 2021-04-13 DIAGNOSIS — I214 Non-ST elevation (NSTEMI) myocardial infarction: Secondary | ICD-10-CM

## 2021-04-13 NOTE — Progress Notes (Signed)
Daily Session Note  Patient Details  Name: Penny Mccullough MRN: [id number] Date of Birth: 14-Apr-1945 Referring Provider:   Flowsheet Row Cardiac Rehab from 02/16/2021 in [facility] Cardiac and Pulmonary Rehab  Referring Provider Gollan       Encounter Date: 04/13/2021  Check In:  Session Check In - 04/13/21 1602       Check-In   Supervising physician immediately available to respond to emergencies See telemetry face sheet for immediately available ER MD    Location [facility]-Cardiac & Pulmonary Rehab    Staff Present Renita Papa, RN BSN;Joseph [place], RCP,RRT,BSRT;Laureen [place], [place], RRT, CPFT    Virtual Visit No    Medication changes reported     No    Fall or balance concerns reported    No    Warm-up and Cool-down Performed on first and last piece of equipment    Resistance Training Performed Yes    VAD Patient? No    PAD/SET Patient? No      Pain Assessment   Currently in Pain? No/denies                Social History   Tobacco Use  Smoking Status Former   Packs/day: 1.00   Years: 30.00   Pack years: 30.00   Types: Cigarettes   Quit date: 02/21/1987   Years since quitting: 34.1  Smokeless Tobacco Never    Goals Met:  Independence with exercise equipment Exercise tolerated well No report of concerns or symptoms today Strength training completed today  Goals Unmet:  Not Applicable  Comments: Pt able to follow exercise prescription today without complaint.  Will continue to monitor for progression.    Dr. Emily Filbert is Medical Director for [place].  Dr. Ottie Glazier is Medical Director for [facility] Pulmonary Rehabilitation.

## 2021-04-17 ENCOUNTER — Other Ambulatory Visit: Payer: Self-pay

## 2021-04-17 DIAGNOSIS — I252 Old myocardial infarction: Secondary | ICD-10-CM | POA: Diagnosis not present

## 2021-04-17 DIAGNOSIS — Z955 Presence of coronary angioplasty implant and graft: Secondary | ICD-10-CM

## 2021-04-17 DIAGNOSIS — I214 Non-ST elevation (NSTEMI) myocardial infarction: Secondary | ICD-10-CM

## 2021-04-17 NOTE — Progress Notes (Signed)
Daily Session Note  Patient Details  Name: Penny Mccullough MRN: [id number] Date of Birth: Jan 17, 1946 Referring Provider:   Flowsheet Row Cardiac Rehab from 02/16/2021 in [facility] Cardiac and Pulmonary Rehab  Referring Provider Gollan       Encounter Date: 04/17/2021  Check In:  Session Check In - 04/17/21 1622       Check-In   Supervising physician immediately available to respond to emergencies See telemetry face sheet for immediately available ER MD    Location [facility]-Cardiac & Pulmonary Rehab    Staff Present Birdie Sons, MPA, Nino Glow, MS, ASCM CEP, Exercise Physiologist;Joseph Tessie Fass, [place]    Virtual Visit No    Medication changes reported     No    Fall or balance concerns reported    No    Tobacco Cessation No Change    Warm-up and Cool-down Performed on first and last piece of equipment    Resistance Training Performed Yes    VAD Patient? No    PAD/SET Patient? No      Pain Assessment   Currently in Pain? No/denies                Social History   Tobacco Use  Smoking Status Former   Packs/day: 1.00   Years: 30.00   Pack years: 30.00   Types: Cigarettes   Quit date: 02/21/1987   Years since quitting: 34.1  Smokeless Tobacco Never    Goals Met:  Independence with exercise equipment Exercise tolerated well No report of concerns or symptoms today Strength training completed today  Goals Unmet:  Not Applicable  Comments: Pt able to follow exercise prescription today without complaint.  Will continue to monitor for progression.    Dr. Emily Filbert is Medical Director for [place].  Dr. Ottie Glazier is Medical Director for [facility] Pulmonary Rehabilitation.

## 2021-04-19 ENCOUNTER — Encounter: Payer: Medicare Other | Attending: Cardiovascular Disease

## 2021-04-19 ENCOUNTER — Other Ambulatory Visit: Payer: Self-pay

## 2021-04-19 DIAGNOSIS — I214 Non-ST elevation (NSTEMI) myocardial infarction: Secondary | ICD-10-CM | POA: Diagnosis not present

## 2021-04-19 DIAGNOSIS — Z955 Presence of coronary angioplasty implant and graft: Secondary | ICD-10-CM | POA: Diagnosis present

## 2021-04-19 NOTE — Progress Notes (Signed)
Daily Session Note ? ?Patient Details  ?Name: Penny Mccullough ?MRN: [id number] ?Date of Birth: 1945/05/27 ?Referring Provider:   ?Flowsheet Row Cardiac Rehab from 02/16/2021 in [facility] Cardiac and Pulmonary Rehab  ?Referring Provider Gollan  ? ?  ? ? ?Encounter Date: 04/19/2021 ? ?Check In: ? Session Check In - 04/19/21 1557   ? ?  ? Check-In  ? Supervising physician immediately available to respond to emergencies See telemetry face sheet for immediately available ER MD   ? Location [facility]-Cardiac & Pulmonary Rehab   ? Staff Present Birdie Sons, MPA, RN;Joseph [place], RCP,RRT,BSRT;Kara [place], MS, ASCM CEP, Exercise Physiologist   ? Virtual Visit No   ? Medication changes reported     No   ? Fall or balance concerns reported    No   ? Tobacco Cessation No Change   ? Warm-up and Cool-down Performed on first and last piece of equipment   ? Resistance Training Performed Yes   ? VAD Patient? No   ? PAD/SET Patient? No   ?  ? Pain Assessment  ? Currently in Pain? No/denies   ? ?  ?  ? ?  ? ? ? ? ? ?Social History  ? ?Tobacco Use  ?Smoking Status Former  ? Packs/day: 1.00  ? Years: 30.00  ? Pack years: 30.00  ? Types: Cigarettes  ? Quit date: 02/21/1987  ? Years since quitting: 34.1  ?Smokeless Tobacco Never  ? ? ?Goals Met:  ?Independence with exercise equipment ?Exercise tolerated well ?No report of concerns or symptoms today ?Strength training completed today ? ?Goals Unmet:  ?Not Applicable ? ?Comments: Pt able to follow exercise prescription today without complaint.  Will continue to monitor for progression. ? ? ? ?Dr. Emily Filbert is Medical Director for [place].  ?Dr. Ottie Glazier is Medical Director for [facility] Pulmonary Rehabilitation. ?

## 2021-04-20 ENCOUNTER — Other Ambulatory Visit: Payer: Self-pay

## 2021-04-20 ENCOUNTER — Encounter: Payer: Medicare Other | Admitting: *Deleted

## 2021-04-20 DIAGNOSIS — Z955 Presence of coronary angioplasty implant and graft: Secondary | ICD-10-CM

## 2021-04-20 DIAGNOSIS — I214 Non-ST elevation (NSTEMI) myocardial infarction: Secondary | ICD-10-CM | POA: Diagnosis not present

## 2021-04-20 NOTE — Progress Notes (Signed)
Daily Session Note ? ?Patient Details  ?Name: Penny Mccullough ?MRN: [id number] ?Date of Birth: 11-28-1945 ?Referring Provider:   ?Flowsheet Row Cardiac Rehab from 02/16/2021 in [facility] Cardiac and Pulmonary Rehab  ?Referring Provider Gollan  ? ?  ? ? ?Encounter Date: 04/20/2021 ? ?Check In: ? Session Check In - 04/20/21 1559   ? ?  ? Check-In  ? Supervising physician immediately available to respond to emergencies See telemetry face sheet for immediately available ER MD   ? Location [facility]-Cardiac & Pulmonary Rehab   ? Staff Present Renita Papa, RN BSN;Joseph [place], RCP,RRT,BSRT;Jessica [place], [place], [place], [place], CCET   ? Virtual Visit No   ? Medication changes reported     No   ? Fall or balance concerns reported    No   ? Warm-up and Cool-down Performed on first and last piece of equipment   ? Resistance Training Performed Yes   ? VAD Patient? No   ? PAD/SET Patient? No   ?  ? Pain Assessment  ? Currently in Pain? No/denies   ? ?  ?  ? ?  ? ? ? ? ? ?Social History  ? ?Tobacco Use  ?Smoking Status Former  ? Packs/day: 1.00  ? Years: 30.00  ? Pack years: 30.00  ? Types: Cigarettes  ? Quit date: 02/21/1987  ? Years since quitting: 34.1  ?Smokeless Tobacco Never  ? ? ?Goals Met:  ?Independence with exercise equipment ?Exercise tolerated well ?No report of concerns or symptoms today ?Strength training completed today ? ?Goals Unmet:  ?Not Applicable ? ?Comments: Pt able to follow exercise prescription today without complaint.  Will continue to monitor for progression. ? ? ? ?Dr. Emily Filbert is Medical Director for [place].  ?Dr. Ottie Glazier is Medical Director for [facility] Pulmonary Rehabilitation. ?

## 2021-04-24 ENCOUNTER — Other Ambulatory Visit: Payer: Self-pay

## 2021-04-24 DIAGNOSIS — Z955 Presence of coronary angioplasty implant and graft: Secondary | ICD-10-CM

## 2021-04-24 DIAGNOSIS — I214 Non-ST elevation (NSTEMI) myocardial infarction: Secondary | ICD-10-CM

## 2021-04-24 NOTE — Progress Notes (Signed)
Daily Session Note ? ?Patient Details  ?Name: Penny Mccullough ?MRN: [id number] ?Date of Birth: [birth year] ?Referring Provider:   ?Flowsheet Row Cardiac Rehab from 02/16/2021 in [facility] Cardiac and Pulmonary Rehab  ?Referring Provider Gollan  ? ?  ? ? ?Encounter Date: 04/24/2021 ? ?Check In: ? Session Check In - 04/24/21 1609   ? ?  ? Check-In  ? Supervising physician immediately available to respond to emergencies See telemetry face sheet for immediately available ER MD   ? Location [facility]-Cardiac & Pulmonary Rehab   ? Staff Present Birdie Sons, MPA, RN;Joseph [place], RCP,RRT,BSRT;Kara [place], MS, ASCM CEP, Exercise Physiologist   ? Virtual Visit No   ? Medication changes reported     No   ? Fall or balance concerns reported    No   ? Tobacco Cessation No Change   ? Warm-up and Cool-down Performed on first and last piece of equipment   ? Resistance Training Performed Yes   ? VAD Patient? No   ? PAD/SET Patient? No   ?  ? Pain Assessment  ? Currently in Pain? No/denies   ? ?  ?  ? ?  ? ? ? ? ? ?Social History  ? ?Tobacco Use  ?Smoking Status Former  ? Packs/day: 1.00  ? Years: 30.00  ? Pack years: 30.00  ? Types: Cigarettes  ? Quit date: 02/21/1987  ? Years since quitting: 34.1  ?Smokeless Tobacco Never  ? ? ?Goals Met:  ?Independence with exercise equipment ?Exercise tolerated well ?No report of concerns or symptoms today ?Strength training completed today ? ?Goals Unmet:  ?Not Applicable ? ?Comments: Pt able to follow exercise prescription today without complaint.  Will continue to monitor for progression. ? ? ? ?Dr. Emily Filbert is Medical Director for Penny Mccullough.  ?Dr. Ottie Glazier is Medical Director for [facility] Pulmonary Rehabilitation. ?

## 2021-04-26 ENCOUNTER — Other Ambulatory Visit: Payer: Self-pay

## 2021-04-26 DIAGNOSIS — I214 Non-ST elevation (NSTEMI) myocardial infarction: Secondary | ICD-10-CM | POA: Diagnosis not present

## 2021-04-26 DIAGNOSIS — Z955 Presence of coronary angioplasty implant and graft: Secondary | ICD-10-CM

## 2021-04-26 NOTE — Progress Notes (Signed)
Daily Session Note ? ?Patient Details  ?Name: Penny Mccullough ?MRN: [id number] ?Date of Birth: 22-Aug-1945 ?Referring Provider:   ?Flowsheet Row Cardiac Rehab from 02/16/2021 in [facility] Cardiac and Pulmonary Rehab  ?Referring Provider Gollan  ? ?  ? ? ?Encounter Date: 04/26/2021 ? ?Check In: ? Session Check In - 04/26/21 1614   ? ?  ? Check-In  ? Supervising physician immediately available to respond to emergencies See telemetry face sheet for immediately available ER MD   ? Location [facility]-Cardiac & Pulmonary Rehab   ? Staff Present Birdie Sons, MPA, Nino Glow, MS, ASCM CEP, Exercise Physiologist;Joseph [place], [place]   ? Virtual Visit No   ? Medication changes reported     No   ? Fall or balance concerns reported    No   ? Tobacco Cessation No Change   ? Warm-up and Cool-down Performed on first and last piece of equipment   ? Resistance Training Performed Yes   ? VAD Patient? No   ? PAD/SET Patient? No   ?  ? Pain Assessment  ? Currently in Pain? No/denies   ? ?  ?  ? ?  ? ? ? ? ? ?Social History  ? ?Tobacco Use  ?Smoking Status Former  ? Packs/day: 1.00  ? Years: 30.00  ? Pack years: 30.00  ? Types: Cigarettes  ? Quit date: 02/21/1987  ? Years since quitting: 34.2  ?Smokeless Tobacco Never  ? ? ?Goals Met:  ?Independence with exercise equipment ?Exercise tolerated well ?No report of concerns or symptoms today ?Strength training completed today ? ?Goals Unmet:  ?Not Applicable ? ?Comments: Pt able to follow exercise prescription today without complaint.  Will continue to monitor for progression. ? ? ? ?Dr. Emily Filbert is Medical Director for [place].  ?Dr. Ottie Glazier is Medical Director for [facility] Pulmonary Rehabilitation. ?

## 2021-05-01 ENCOUNTER — Other Ambulatory Visit: Payer: Self-pay

## 2021-05-01 DIAGNOSIS — I214 Non-ST elevation (NSTEMI) myocardial infarction: Secondary | ICD-10-CM

## 2021-05-01 DIAGNOSIS — Z955 Presence of coronary angioplasty implant and graft: Secondary | ICD-10-CM

## 2021-05-01 NOTE — Progress Notes (Signed)
Daily Session Note ? ?Patient Details  ?Name: Penny Mccullough ?MRN: [id number] ?Date of Birth: 1945-05-05 ?Referring Provider:   ?Flowsheet Row Cardiac Rehab from 02/16/2021 in [facility] Cardiac and Pulmonary Rehab  ?Referring Provider Gollan  ? ?  ? ? ?Encounter Date: 05/01/2021 ? ?Check In: ? Session Check In - 05/01/21 1607   ? ?  ? Check-In  ? Supervising physician immediately available to respond to emergencies See telemetry face sheet for immediately available ER MD   ? Location [facility]-Cardiac & Pulmonary Rehab   ? Staff Present Birdie Sons, MPA, RN;Joseph [place], RCP,RRT,BSRT;Kara [place], MS, ASCM CEP, Exercise Physiologist   ? Virtual Visit No   ? Medication changes reported     No   ? Fall or balance concerns reported    No   ? Tobacco Cessation No Change   ? Warm-up and Cool-down Performed on first and last piece of equipment   ? Resistance Training Performed Yes   ? VAD Patient? No   ? PAD/SET Patient? No   ?  ? Pain Assessment  ? Currently in Pain? No/denies   ? ?  ?  ? ?  ? ? ? ? ? ?Social History  ? ?Tobacco Use  ?Smoking Status Former  ? Packs/day: 1.00  ? Years: 30.00  ? Pack years: 30.00  ? Types: Cigarettes  ? Quit date: 02/21/1987  ? Years since quitting: 34.2  ?Smokeless Tobacco Never  ? ? ?Goals Met:  ?Independence with exercise equipment ?Exercise tolerated well ?No report of concerns or symptoms today ?Strength training completed today ? ?Goals Unmet:  ?Not Applicable ? ?Comments: Pt able to follow exercise prescription today without complaint.  Will continue to monitor for progression. ? ? ? ?Dr. Emily Filbert is Medical Director for [place].  ?Dr. Ottie Glazier is Medical Director for [facility] Pulmonary Rehabilitation. ?

## 2021-05-03 ENCOUNTER — Other Ambulatory Visit: Payer: Self-pay

## 2021-05-03 ENCOUNTER — Encounter: Payer: Medicare Other | Admitting: *Deleted

## 2021-05-03 DIAGNOSIS — I214 Non-ST elevation (NSTEMI) myocardial infarction: Secondary | ICD-10-CM

## 2021-05-03 NOTE — Progress Notes (Signed)
Daily Session Note ? ?Patient Details  ?Name: Penny Mccullough ?MRN: [id number] ?Date of Birth: Mar 21, 1945 ?Referring Provider:   ?Flowsheet Row Cardiac Rehab from 02/16/2021 in [facility] Cardiac and Pulmonary Rehab  ?Referring Provider Gollan  ? ?  ? ? ?Encounter Date: 05/03/2021 ? ?Check In: ? Session Check In - 05/03/21 1613   ? ?  ? Check-In  ? Supervising physician immediately available to respond to emergencies See telemetry face sheet for immediately available ER MD   ? Location [facility]-Cardiac & Pulmonary Rehab   ? Staff Present Renita Papa, RN BSN;Joseph [place], RCP,RRT,BSRT;Laureen [place], [place], RRT, CPFT   ? Virtual Visit No   ? Medication changes reported     No   ? Fall or balance concerns reported    No   ? Warm-up and Cool-down Performed on first and last piece of equipment   ? Resistance Training Performed Yes   ? VAD Patient? No   ? PAD/SET Patient? No   ?  ? Pain Assessment  ? Currently in Pain? No/denies   ? ?  ?  ? ?  ? ? ? ? ? ?Social History  ? ?Tobacco Use  ?Smoking Status Former  ? Packs/day: 1.00  ? Years: 30.00  ? Pack years: 30.00  ? Types: Cigarettes  ? Quit date: 02/21/1987  ? Years since quitting: 34.2  ?Smokeless Tobacco Never  ? ? ?Goals Met:  ?Independence with exercise equipment ?Exercise tolerated well ?No report of concerns or symptoms today ?Strength training completed today ? ?Goals Unmet:  ?Not Applicable ? ?Comments: Pt able to follow exercise prescription today without complaint.  Will continue to monitor for progression. ? ? ? ?Dr. Emily Filbert is Medical Director for [place].  ?Dr. Ottie Glazier is Medical Director for [facility] Pulmonary Rehabilitation. ?

## 2021-05-04 ENCOUNTER — Other Ambulatory Visit: Payer: Self-pay

## 2021-05-04 ENCOUNTER — Encounter: Payer: Medicare Other | Admitting: *Deleted

## 2021-05-04 DIAGNOSIS — I214 Non-ST elevation (NSTEMI) myocardial infarction: Secondary | ICD-10-CM | POA: Diagnosis not present

## 2021-05-04 NOTE — Progress Notes (Signed)
Daily Session Note ? ?Patient Details  ?Name: Penny Mccullough ?MRN: [id number] ?Date of Birth: Apr 28, 1945 ?Referring Provider:   ?Flowsheet Row Cardiac Rehab from 02/16/2021 in [facility] Cardiac and Pulmonary Rehab  ?Referring Provider Gollan  ? ?  ? ? ?Encounter Date: 05/04/2021 ? ?Check In: ? Session Check In - 05/04/21 1556   ? ?  ? Check-In  ? Supervising physician immediately available to respond to emergencies See telemetry face sheet for immediately available ER MD   ? Location [facility]-Cardiac & Pulmonary Rehab   ? Staff Present Renita Papa, RN BSN;Joseph [place], RCP,RRT,BSRT;Jessica [place], [place], [place], [place], CCET   ? Virtual Visit No   ? Medication changes reported     No   ? Fall or balance concerns reported    No   ? Warm-up and Cool-down Performed on first and last piece of equipment   ? Resistance Training Performed Yes   ? VAD Patient? No   ? PAD/SET Patient? No   ?  ? Pain Assessment  ? Currently in Pain? No/denies   ? ?  ?  ? ?  ? ? ? ? ? ?Social History  ? ?Tobacco Use  ?Smoking Status Former  ? Packs/day: 1.00  ? Years: 30.00  ? Pack years: 30.00  ? Types: Cigarettes  ? Quit date: 02/21/1987  ? Years since quitting: 34.2  ?Smokeless Tobacco Never  ? ? ?Goals Met:  ?Independence with exercise equipment ?Exercise tolerated well ?No report of concerns or symptoms today ?Strength training completed today ? ?Goals Unmet:  ?Not Applicable ? ?Comments: Pt able to follow exercise prescription today without complaint.  Will continue to monitor for progression. ? ? ? ?Dr. Emily Filbert is Medical Director for [place].  ?Dr. Ottie Glazier is Medical Director for [facility] Pulmonary Rehabilitation. ?

## 2021-05-08 ENCOUNTER — Other Ambulatory Visit: Payer: Self-pay

## 2021-05-08 VITALS — Ht 60.75 in | Wt 124.3 lb

## 2021-05-08 DIAGNOSIS — I214 Non-ST elevation (NSTEMI) myocardial infarction: Secondary | ICD-10-CM

## 2021-05-08 DIAGNOSIS — Z955 Presence of coronary angioplasty implant and graft: Secondary | ICD-10-CM

## 2021-05-08 NOTE — Progress Notes (Signed)
Daily Session Note ? ?Patient Details  ?Name: Penny Mccullough ?MRN: [id number] ?Date of Birth: 1945/12/30 ?Referring Provider:   ?Flowsheet Row Cardiac Rehab from 02/16/2021 in [facility] Cardiac and Pulmonary Rehab  ?Referring Provider Gollan  ? ?  ? ? ?Encounter Date: 05/08/2021 ? ?Check In: ? Session Check In - 05/08/21 1604   ? ?  ? Check-In  ? Supervising physician immediately available to respond to emergencies See telemetry face sheet for immediately available ER MD   ? Location [facility]-Cardiac & Pulmonary Rehab   ? Staff Present Birdie Sons, MPA, Nino Glow, MS, ASCM CEP, Exercise Physiologist;Joseph [place], [place]   ? Virtual Visit No   ? Medication changes reported     No   ? Fall or balance concerns reported    No   ? Tobacco Cessation No Change   ? Warm-up and Cool-down Performed on first and last piece of equipment   ? Resistance Training Performed Yes   ? VAD Patient? No   ? PAD/SET Patient? No   ?  ? Pain Assessment  ? Currently in Pain? No/denies   ? ?  ?  ? ?  ? ? ? ? ? ?Social History  ? ?Tobacco Use  ?Smoking Status Former  ? Packs/day: 1.00  ? Years: 30.00  ? Pack years: 30.00  ? Types: Cigarettes  ? Quit date: 02/21/1987  ? Years since quitting: 34.2  ?Smokeless Tobacco Never  ? ? ?Goals Met:  ?Independence with exercise equipment ?Exercise tolerated well ?No report of concerns or symptoms today ?Strength training completed today ? ?Goals Unmet:  ?Not Applicable ? ?Comments: Pt able to follow exercise prescription today without complaint.  Will continue to monitor for progression. ? ? ? ?Dr. Emily Filbert is Medical Director for [place].  ?Dr. Ottie Glazier is Medical Director for [facility] Pulmonary Rehabilitation. ?

## 2021-05-10 ENCOUNTER — Other Ambulatory Visit: Payer: Self-pay

## 2021-05-10 ENCOUNTER — Encounter: Payer: Self-pay | Admitting: *Deleted

## 2021-05-10 DIAGNOSIS — I214 Non-ST elevation (NSTEMI) myocardial infarction: Secondary | ICD-10-CM | POA: Diagnosis not present

## 2021-05-10 DIAGNOSIS — Z955 Presence of coronary angioplasty implant and graft: Secondary | ICD-10-CM

## 2021-05-10 NOTE — Progress Notes (Signed)
Daily Session Note ? ?Patient Details  ?Name: Maylen Waltermire ?MRN: [id number] ?Date of Birth: [birth year] ?Referring Provider:   ?Flowsheet Row Cardiac Rehab from 02/16/2021 in [facility] Cardiac and Pulmonary Rehab  ?Referring Provider Gollan  ? ?  ? ? ?Encounter Date: 05/10/2021 ? ?Check In: ? Session Check In - 05/10/21 1614   ? ?  ? Check-In  ? Supervising physician immediately available to respond to emergencies See telemetry face sheet for immediately available ER MD   ? Location [facility]-Cardiac & Pulmonary Rehab   ? Staff Present Birdie Sons, MPA, Nino Glow, MS, ASCM CEP, Exercise Physiologist;Joseph [place], [place]   ? Virtual Visit No   ? Medication changes reported     No   ? Fall or balance concerns reported    No   ? Tobacco Cessation No Change   ? Warm-up and Cool-down Performed on first and last piece of equipment   ? Resistance Training Performed Yes   ? VAD Patient? No   ? PAD/SET Patient? No   ?  ? Pain Assessment  ? Currently in Pain? No/denies   ? ?  ?  ? ?  ? ? ? ? ? ?Social History  ? ?Tobacco Use  ?Smoking Status Former  ? Packs/day: 1.00  ? Years: 30.00  ? Pack years: 30.00  ? Types: Cigarettes  ? Quit date: 02/21/1987  ? Years since quitting: 34.2  ?Smokeless Tobacco Never  ? ? ?Goals Met:  ?Independence with exercise equipment ?Exercise tolerated well ?No report of concerns or symptoms today ?Strength training completed today ? ?Goals Unmet:  ?Not Applicable ? ?Comments: Pt able to follow exercise prescription today without complaint.  Will continue to monitor for progression. ? ? ? ?Dr. Emily Filbert is Medical Director for [place].  ?Dr. Ottie Glazier is Medical Director for [facility] Pulmonary Rehabilitation. ?

## 2021-05-10 NOTE — Progress Notes (Signed)
Cardiac Individual Treatment Plan ? ?Patient Details  ?Name: Penny Mccullough ?MRN: [id number] ?Date of Birth: [birth year] ?Referring Provider:   ?Flowsheet Row Cardiac Rehab from 02/16/2021 in [facility] Cardiac and Pulmonary Rehab  ?Referring Provider Gollan  ? ?  ? ? ?Initial Encounter Date:  ?Flowsheet Row Cardiac Rehab from 02/16/2021 in [facility] Cardiac and Pulmonary Rehab  ?Date 02/16/21  ? ?  ? ? ?Visit Diagnosis: NSTEMI (non-ST elevation myocardial infarction) ([place]) ? ?Patient's Home Medications on Admission: ? ?Current Outpatient Medications:  ?  albuterol (VENTOLIN HFA) 108 (90 Base) MCG/ACT inhaler, Inhale 2 puffs into the lungs every 6 (six) hours as needed for wheezing or shortness of breath., Disp: 8 g, Rfl: 0 ?  apixaban (ELIQUIS) 5 MG TABS tablet, Take 1 tablet (5 mg total) by mouth 2 (two) times daily., Disp: 90 tablet, Rfl: 3 ?  atorvastatin (LIPITOR) 80 MG tablet, Take 1 tablet (80 mg total) by mouth daily., Disp: 90 tablet, Rfl: 3 ?  clopidogrel (PLAVIX) 75 MG tablet, Take 1 tablet (75 mg total) by mouth daily., Disp: 92 tablet, Rfl: 3 ?  digoxin (LANOXIN) 0.125 MG tablet, Take 1 tablet (0.125 mg total) by mouth daily., Disp: 90 tablet, Rfl: 3 ?  empagliflozin (JARDIANCE) 10 MG TABS tablet, Take 10 mg by mouth daily., Disp: , Rfl:  ?  furosemide (LASIX) 20 MG tablet, Take 2 tablets (40 mg total) by mouth daily., Disp: 180 tablet, Rfl: 3 ?  metoprolol succinate (TOPROL XL) 50 MG 24 hr tablet, Take 1.5 tablets (75 mg total) by mouth daily., Disp: 140 tablet, Rfl: 3 ?  nitroGLYCERIN (NITROSTAT) 0.4 MG SL tablet, Place 1 tablet (0.4 mg total) under the tongue every 5 (five) minutes as needed for chest pain., Disp: 25 tablet, Rfl: 2 ?  potassium chloride (KLOR-CON) 10 MEQ tablet, Take 2 tablets (20 mEq total) by mouth daily., Disp: 180 tablet, Rfl: 3 ? ?Past Medical History: ?Past Medical History:  ?Diagnosis Date  ? CAD (coronary artery disease)   ? CHF (congestive heart failure) ([place])   ? COPD (chronic obstructive  pulmonary disease) ([place])   ? CVA (cerebral vascular accident) [facility])   ? HLD (hyperlipidemia)   ? Hypertension   ? MI (myocardial infarction) ([place]) 10/2020  ? ? ?Tobacco Use: ?Social History  ? ?Tobacco Use  ?Smoking Status Former  ? Packs/day: 1.00  ? Years: 30.00  ? Pack years: 30.00  ? Types: Cigarettes  ? Quit date: 02/21/1987  ? Years since quitting: 34.2  ?Smokeless Tobacco Never  ? ? ?Labs: ?Recent Review Flowsheet Data   ? ? Labs for [facility] Cardiac and Pulmonary Rehab Latest Ref Rng & Units 10/27/2020 12/14/2020  ? Cholestrol 0 - 200 mg/dL 270(H) 144  ? LDLCALC 0 - 99 mg/dL 199(H) NOT CALCULATED   ? HDL >40 mg/dL 47 43  ? Trlycerides <150 mg/dL 120 126  ? Hemoglobin A1c 4.8 - 5.6 % 5.2 -  ? ?  ? ? ? ?Exercise Target Goals: ?Exercise Program Goal: ?Individual exercise prescription set using results from initial 6 min walk test and THRR while considering  patient?s activity barriers and safety.  ? ?Exercise Prescription Goal: ?Initial exercise prescription builds to 30-45 minutes a day of aerobic activity, 2-3 days per week.  Home exercise guidelines will be given to patient during program as part of exercise prescription that the participant will acknowledge. ? ? ?Education: Aerobic Exercise: ?- Group verbal and visual presentation on the components of exercise prescription. Introduces F.I.T.T principle from ACSM  for exercise prescriptions.  Reviews F.I.T.T. principles of aerobic exercise including progression. Written material given at graduation. ? ? ?Education: Resistance Exercise: ?- Group verbal and visual presentation on the components of exercise prescription. Introduces F.I.T.T principle from ACSM for exercise prescriptions  Reviews F.I.T.T. principles of resistance exercise including progression. Written material given at graduation. ? ?  ?Education: Exercise & Equipment Safety: ?- Individual verbal instruction and demonstration of equipment use and safety with use of the equipment. ?Flowsheet Row Cardiac  Rehab from 02/16/2021 in [facility] Cardiac and Pulmonary Rehab  ?Date 02/16/21  ?Educator AS  ?Instruction Review Code 1- Verbalizes Understanding  ? ?  ? ? ?Education: Exercise Physiology & General Exercise Guidelines: ?- Group verbal and written instruction with models to review the exercise physiology of the cardiovascular system and associated critical values. Provides general exercise guidelines with specific guidelines to those with heart or lung disease.  ?Flowsheet Row Cardiac Rehab from 02/16/2021 in [facility] Cardiac and Pulmonary Rehab  ?Education need identified 02/16/21  ? ?  ? ? ?Education: Flexibility, Balance, Mind/Body Relaxation: ?- Group verbal and visual presentation with interactive activity on the components of exercise prescription. Introduces F.I.T.T principle from ACSM for exercise prescriptions. Reviews F.I.T.T. principles of flexibility and balance exercise training including progression. Also discusses the mind body connection.  Reviews various relaxation techniques to help reduce and manage stress (i.e. Deep breathing, progressive muscle relaxation, and visualization). Balance handout provided to take home. Written material given at graduation. ? ? ?Activity Barriers & Risk Stratification: ? ? ?6 Minute Walk: ? 6 Minute Walk   ? ? [place] Name 02/16/21 1511 05/08/21 1645  ?  ?  ? 6 Minute Walk  ? Phase Initial Discharge   ? Distance 1070 feet 1295 feet   ? Distance % Change -- 21 %   ? Distance Feet Change -- 225 ft   ? Walk Time 6 minutes 6 minutes   ? # of Rest Breaks 0 0   ? MPH 2 2.45   ? METS 2.48 2.34   ? RPE 13 15   ? Perceived Dyspnea  2 0   ? VO2 Peak 8.67 8.2   ? Symptoms Yes (comment) No   ? Comments SOB --   ? Resting HR 105 bpm 55 bpm   ? Resting BP 122/72 112/60   ? Resting Oxygen Saturation  98 % 93 %   ? Exercise Oxygen Saturation  during 6 min walk 98 % 95 %   ? Max Ex. HR 119 bpm 75 bpm   ? Max Ex. BP 134/70 106/60   ? 2 Minute Post BP 112/68 --   ? ?  ?  ? ?  ? ? ?Oxygen Initial  Assessment: ? ? ?Oxygen Re-Evaluation: ? ? ?Oxygen Discharge (Final Oxygen Re-Evaluation): ? ? ?Initial Exercise Prescription: ? Initial Exercise Prescription - 02/16/21 1500   ? ?  ? Date of Initial Exercise RX and Referring Provider  ? Date 02/16/21   ? Referring Provider Gollan   ?  ? Oxygen  ? Maintain Oxygen Saturation 88% or higher   ?  ? Treadmill  ? MPH 1.8   ? Grade 0.5   ? Minutes 15   ? METs 2.5   ?  ? NuStep  ? Level 1   ? SPM 80   ? Minutes 15   ? METs 2.4   ?  ? REL-XR  ? Level 1   ? Speed 50   ? Minutes 15   ?  METs 2.4   ?  ? Biostep-RELP  ? Level 1   ? SPM 50   ? Minutes 15   ? METs 2   ?  ? Prescription Details  ? Frequency (times per week) 3   ? Duration Progress to 30 minutes of continuous aerobic without signs/symptoms of physical distress   ?  ? Intensity  ? THRR 40-80% of Max Heartrate 121-138   ? Ratings of Perceived Exertion 11-13   ? Perceived Dyspnea 0-4   ?  ? Resistance Training  ? Training Prescription Yes   ? Weight 3 lb   ? Reps 10-15   ? ?  ?  ? ?  ? ? ?Perform Capillary Blood Glucose checks as needed. ? ?Exercise Prescription Changes: ? ? Exercise Prescription Changes   ? ? [place] Name 02/16/21 1500 02/27/21 1400 03/13/21 1200 03/27/21 1500 03/30/21 1600  ?  ? Response to Exercise  ? Blood Pressure (Admit) 122/72 124/62 102/58 94/58 --  ? Blood Pressure (Exercise) 134/70 126/64 130/64 100/60 --  ? Blood Pressure (Exit) 112/68 108/68 100/60 90/58 --  ? Heart Rate (Admit) 105 bpm 105 bpm 89 bpm 59 bpm --  ? Heart Rate (Exercise) 119 bpm 128 bpm 124 bpm 68 bpm --  ? Heart Rate (Exit) 108 bpm 100 bpm 113 bpm 56 bpm --  ? Oxygen Saturation (Admit) 98 % -- -- -- --  ? Oxygen Saturation (Exercise) 98 % -- -- -- --  ? Rating of Perceived Exertion (Exercise) 13 15 15 13  --  ? Perceived Dyspnea (Exercise) 2 -- -- -- --  ? Symptoms SOB -- none none --  ? Comments -- second day -- -- --  ? Duration -- Progress to 30 minutes of  aerobic without signs/symptoms of physical distress Continue with 30  min of aerobic exercise without signs/symptoms of physical distress. Continue with 30 min of aerobic exercise without signs/symptoms of physical distress. --  ? Intensity -- THRR unchanged THRR unchanged TH

## 2021-05-11 ENCOUNTER — Other Ambulatory Visit: Payer: Self-pay

## 2021-05-11 ENCOUNTER — Encounter: Payer: Medicare Other | Admitting: *Deleted

## 2021-05-11 DIAGNOSIS — Z955 Presence of coronary angioplasty implant and graft: Secondary | ICD-10-CM

## 2021-05-11 DIAGNOSIS — I214 Non-ST elevation (NSTEMI) myocardial infarction: Secondary | ICD-10-CM | POA: Diagnosis not present

## 2021-05-11 NOTE — Progress Notes (Signed)
Daily Session Note ? ?Patient Details  ?Name: Garyn Waguespack ?MRN: [id number] ?Date of Birth: Jun 25, 1945 ?Referring Provider:   ?Flowsheet Row Cardiac Rehab from 02/16/2021 in [facility] Cardiac and Pulmonary Rehab  ?Referring Provider Gollan  ? ?  ? ? ?Encounter Date: 05/11/2021 ? ?Check In: ? Session Check In - 05/11/21 1603   ? ?  ? Check-In  ? Supervising physician immediately available to respond to emergencies See telemetry face sheet for immediately available ER MD   ? Location [facility]-Cardiac & Pulmonary Rehab   ? Staff Present Renita Papa, RN BSN;Joseph [place], RCP,RRT,BSRT;Jessica [place], [place], [place], [place], CCET   ? Virtual Visit No   ? Medication changes reported     No   ? Fall or balance concerns reported    No   ? Warm-up and Cool-down Performed on first and last piece of equipment   ? Resistance Training Performed Yes   ? VAD Patient? No   ? PAD/SET Patient? No   ?  ? Pain Assessment  ? Currently in Pain? No/denies   ? ?  ?  ? ?  ? ? ? ? ? ?Social History  ? ?Tobacco Use  ?Smoking Status Former  ? Packs/day: 1.00  ? Years: 30.00  ? Pack years: 30.00  ? Types: Cigarettes  ? Quit date: 02/21/1987  ? Years since quitting: 34.2  ?Smokeless Tobacco Never  ? ? ?Goals Met:  ?Independence with exercise equipment ?Exercise tolerated well ?No report of concerns or symptoms today ?Strength training completed today ? ?Goals Unmet:  ?Not Applicable ? ?Comments: Pt able to follow exercise prescription today without complaint.  Will continue to monitor for progression. ? ? ? ?Dr. Emily Filbert is Medical Director for [place].  ?Dr. Ottie Glazier is Medical Director for [facility] Pulmonary Rehabilitation. ?

## 2021-05-11 NOTE — Patient Instructions (Signed)
Discharge Patient Instructions ? ?Patient Details  ?Name: Penny Mccullough ?MRN: [id number] ?Date of Birth: [birth year] ?Referring Provider:  Minna Merritts, MD ? ? ?Number of Visits: 44 ? ?Reason for Discharge:  ?Patient reached a stable level of exercise. ?Patient independent in their exercise. ?Patient has met program and personal goals. ? ?Smoking History:  ?Social History  ? ?Tobacco Use  ?Smoking Status Former  ? Packs/day: 1.00  ? Years: 30.00  ? Pack years: 30.00  ? Types: Cigarettes  ? Quit date: 02/21/1987  ? Years since quitting: 34.2  ?Smokeless Tobacco Never  ? ? ?Diagnosis:  ?NSTEMI (non-ST elevation myocardial infarction) ([place]) ? ?Status post coronary artery stent placement ? ?Initial Exercise Prescription: ? Initial Exercise Prescription - 02/16/21 1500   ? ?  ? Date of Initial Exercise RX and Referring Provider  ? Date 02/16/21   ? Referring Provider Gollan   ?  ? Oxygen  ? Maintain Oxygen Saturation 88% or higher   ?  ? Treadmill  ? MPH 1.8   ? Grade 0.5   ? Minutes 15   ? METs 2.5   ?  ? NuStep  ? Level 1   ? SPM 80   ? Minutes 15   ? METs 2.4   ?  ? REL-XR  ? Level 1   ? Speed 50   ? Minutes 15   ? METs 2.4   ?  ? Biostep-RELP  ? Level 1   ? SPM 50   ? Minutes 15   ? METs 2   ?  ? Prescription Details  ? Frequency (times per week) 3   ? Duration Progress to 30 minutes of continuous aerobic without signs/symptoms of physical distress   ?  ? Intensity  ? THRR 40-80% of Max Heartrate 121-138   ? Ratings of Perceived Exertion 11-13   ? Perceived Dyspnea 0-4   ?  ? Resistance Training  ? Training Prescription Yes   ? Weight 3 lb   ? Reps 10-15   ? ?  ?  ? ?  ? ? ?Discharge Exercise Prescription (Final Exercise Prescription Changes): ? Exercise Prescription Changes - 05/08/21 1100   ? ?  ? Response to Exercise  ? Blood Pressure (Admit) 122/60   ? Blood Pressure (Exit) 102/60   ? Heart Rate (Admit) 64 bpm   ? Heart Rate (Exercise) 73 bpm   ? Heart Rate (Exit) 54 bpm   ? Rating of Perceived Exertion  (Exercise) 14   ? Symptoms none   ? Duration Continue with 30 min of aerobic exercise without signs/symptoms of physical distress.   ? Intensity THRR unchanged   ?  ? Progression  ? Progression Continue to progress workloads to maintain intensity without signs/symptoms of physical distress.   ? Average METs 2.56   ?  ? Resistance Training  ? Training Prescription Yes   ? Weight 5 lb   ? Reps 10-15   ?  ? Interval Training  ? Interval Training No   ?  ? NuStep  ? Level 4   ? Minutes 15   ? METs 1.8   ?  ? REL-XR  ? Level 1   ? Minutes 15   ? METs 3.4   ?  ? Track  ? Laps 33   ? Minutes 15   ? METs 2.79   ?  ? Home Exercise Plan  ? Plans to continue exercise at Home (comment)   walking, household items  for weights  ? Frequency Add 2 additional days to program exercise sessions.   ? Initial Home Exercises Provided 03/30/21   ?  ? Oxygen  ? Maintain Oxygen Saturation 88% or higher   ? ?  ?  ? ?  ? ? ?Functional Capacity: ? 6 Minute Walk   ? ? [place] Name 02/16/21 1511 05/08/21 1645  ?  ?  ? 6 Minute Walk  ? Phase Initial Discharge   ? Distance 1070 feet 1295 feet   ? Distance % Change -- 21 %   ? Distance Feet Change -- 225 ft   ? Walk Time 6 minutes 6 minutes   ? # of Rest Breaks 0 0   ? MPH 2 2.45   ? METS 2.48 2.34   ? RPE 13 15   ? Perceived Dyspnea  2 0   ? VO2 Peak 8.67 8.2   ? Symptoms Yes (comment) No   ? Comments SOB --   ? Resting HR 105 bpm 55 bpm   ? Resting BP 122/72 112/60   ? Resting Oxygen Saturation  98 % 93 %   ? Exercise Oxygen Saturation  during 6 min walk 98 % 95 %   ? Max Ex. HR 119 bpm 75 bpm   ? Max Ex. BP 134/70 106/60   ? 2 Minute Post BP 112/68 --   ? ?  ?  ? ?  ? ? ? ?Nutrition & Weight - Outcomes: ? Pre Biometrics - 02/16/21 1519   ? ?  ? Pre Biometrics  ? Height 5' (1.524 m)   ? Weight 131 lb 14.4 oz (59.8 kg)   ? BMI (Calculated) 25.76   ? Single Leg Stand 6.33 seconds   ? ?  ?  ? ?  ? ? Post Biometrics - 05/08/21 1646   ? ?  ?  Post  Biometrics  ? Height 5' 0.75" (1.543 m)   ? Weight 124 lb  4.8 oz (56.4 kg)   ? BMI (Calculated) 23.68   ? ?  ?  ? ?  ? ? ?Nutrition: ? Nutrition Therapy & Goals - 03/01/21 1651   ? ?  ? Nutrition Therapy  ? RD appointment deferred Yes   Pt declined consultation with RD, will continue to follow up  ?  ? Personal Nutrition Goals  ? Nutrition Goal Pt declined consultation with RD, will continue to follow up   ? ?  ?  ? ?  ? ?Goals reviewed with patient; copy given to patient. ?

## 2021-05-15 ENCOUNTER — Other Ambulatory Visit: Payer: Self-pay

## 2021-05-15 DIAGNOSIS — Z955 Presence of coronary angioplasty implant and graft: Secondary | ICD-10-CM

## 2021-05-15 DIAGNOSIS — I214 Non-ST elevation (NSTEMI) myocardial infarction: Secondary | ICD-10-CM

## 2021-05-15 NOTE — Progress Notes (Signed)
Daily Session Note ? ?Patient Details  ?Name: Penny Mccullough ?MRN: [id number] ?Date of Birth: [birth year] ?Referring Provider:   ?Flowsheet Row Cardiac Rehab from 02/16/2021 in [facility] Cardiac and Pulmonary Rehab  ?Referring Provider Gollan  ? ?  ? ? ?Encounter Date: 05/15/2021 ? ?Check In: ? Session Check In - 05/15/21 1623   ? ?  ? Check-In  ? Supervising physician immediately available to respond to emergencies See telemetry face sheet for immediately available ER MD   ? Location [facility]-Cardiac & Pulmonary Rehab   ? Staff Present Birdie Sons, MPA, Nino Glow, MS, ASCM CEP, Exercise Physiologist;Joseph [place], [place]   ? Virtual Visit No   ? Medication changes reported     No   ? Fall or balance concerns reported    No   ? Tobacco Cessation No Change   ? Warm-up and Cool-down Performed on first and last piece of equipment   ? Resistance Training Performed Yes   ? VAD Patient? No   ? PAD/SET Patient? No   ?  ? Pain Assessment  ? Currently in Pain? No/denies   ? ?  ?  ? ?  ? ? ? ? ? ?Social History  ? ?Tobacco Use  ?Smoking Status Former  ? Packs/day: 1.00  ? Years: 30.00  ? Pack years: 30.00  ? Types: Cigarettes  ? Quit date: 02/21/1987  ? Years since quitting: 34.2  ?Smokeless Tobacco Never  ? ? ?Goals Met:  ?Independence with exercise equipment ?Exercise tolerated well ?No report of concerns or symptoms today ?Strength training completed today ? ?Goals Unmet:  ?Not Applicable ? ?Comments: Pt able to follow exercise prescription today without complaint.  Will continue to monitor for progression. ? ? ? ?Dr. Emily Filbert is Medical Director for [place].  ?Dr. Ottie Glazier is Medical Director for [facility] Pulmonary Rehabilitation. ?

## 2021-05-17 DIAGNOSIS — I214 Non-ST elevation (NSTEMI) myocardial infarction: Secondary | ICD-10-CM | POA: Diagnosis not present

## 2021-05-17 DIAGNOSIS — Z955 Presence of coronary angioplasty implant and graft: Secondary | ICD-10-CM

## 2021-05-17 NOTE — Progress Notes (Signed)
Daily Session Note ? ?Patient Details  ?Name: Penny Mccullough ?MRN: [id number] ?Date of Birth: 10-31-1945 ?Referring Provider:   ?Flowsheet Row Cardiac Rehab from 02/16/2021 in [facility] Cardiac and Pulmonary Rehab  ?Referring Provider Gollan  ? ?  ? ? ?Encounter Date: 05/17/2021 ? ?Check In: ? Session Check In - 05/17/21 1607   ? ?  ? Check-In  ? Supervising physician immediately available to respond to emergencies See telemetry face sheet for immediately available ER MD   ? Location [facility]-Cardiac & Pulmonary Rehab   ? Staff Present Birdie Sons, MPA, RN;Joseph [place], RCP,RRT,BSRT;Kara Murillo, MS, ASCM CEP, Exercise Physiologist   ? Virtual Visit No   ? Medication changes reported     No   ? Fall or balance concerns reported    No   ? Tobacco Cessation No Change   ? Warm-up and Cool-down Performed on first and last piece of equipment   ? Resistance Training Performed Yes   ? VAD Patient? No   ? PAD/SET Patient? No   ?  ? Pain Assessment  ? Currently in Pain? No/denies   ? ?  ?  ? ?  ? ? ? ? ? ?Social History  ? ?Tobacco Use  ?Smoking Status Former  ? Packs/day: 1.00  ? Years: 30.00  ? Pack years: 30.00  ? Types: Cigarettes  ? Quit date: 02/21/1987  ? Years since quitting: 34.2  ?Smokeless Tobacco Never  ? ? ?Goals Met:  ?Independence with exercise equipment ?Exercise tolerated well ?No report of concerns or symptoms today ?Strength training completed today ? ?Goals Unmet:  ?Not Applicable ? ?Comments: Pt able to follow exercise prescription today without complaint.  Will continue to monitor for progression. ? ? ? ?Dr. Emily Filbert is Medical Director for [place].  ?Dr. Ottie Glazier is Medical Director for [facility] Pulmonary Rehabilitation. ?

## 2021-05-18 ENCOUNTER — Encounter: Payer: Medicare Other | Admitting: *Deleted

## 2021-05-18 DIAGNOSIS — I214 Non-ST elevation (NSTEMI) myocardial infarction: Secondary | ICD-10-CM

## 2021-05-18 DIAGNOSIS — Z955 Presence of coronary angioplasty implant and graft: Secondary | ICD-10-CM

## 2021-05-18 NOTE — Progress Notes (Signed)
Daily Session Note ? ?Patient Details  ?Name: Penny Mccullough ?MRN: [id number] ?Date of Birth: [birth year] ?Referring Provider:   ?Flowsheet Row Cardiac Rehab from 02/16/2021 in [facility] Cardiac and Pulmonary Rehab  ?Referring Provider Gollan  ? ?  ? ? ?Encounter Date: 05/18/2021 ? ?Check In: ? Session Check In - 05/18/21 1603   ? ?  ? Check-In  ? Staff Present Renita Papa, RN BSN;Joseph [place], RCP,RRT,BSRT;Jessica [place], [place], [place], [place], CCET   ? Virtual Visit No   ? Medication changes reported     No   ? Fall or balance concerns reported    No   ? Warm-up and Cool-down Performed on first and last piece of equipment   ? Resistance Training Performed Yes   ? VAD Patient? No   ? PAD/SET Patient? No   ?  ? Pain Assessment  ? Currently in Pain? No/denies   ? ?  ?  ? ?  ? ? ? ? ? ?Social History  ? ?Tobacco Use  ?Smoking Status Former  ? Packs/day: 1.00  ? Years: 30.00  ? Pack years: 30.00  ? Types: Cigarettes  ? Quit date: 02/21/1987  ? Years since quitting: 34.2  ?Smokeless Tobacco Never  ? ? ?Goals Met:  ?Independence with exercise equipment ?Exercise tolerated well ?No report of concerns or symptoms today ?Strength training completed today ? ?Goals Unmet:  ?Not Applicable ? ?Comments: Pt able to follow exercise prescription today without complaint.  Will continue to monitor for progression. ? ? ? ?Dr. Emily Filbert is Medical Director for [place].  ?Dr. Ottie Glazier is Medical Director for [facility] Pulmonary Rehabilitation. ?

## 2021-05-22 ENCOUNTER — Telehealth: Payer: Self-pay | Admitting: *Deleted

## 2021-05-22 ENCOUNTER — Encounter: Payer: Medicare Other | Attending: Cardiovascular Disease

## 2021-05-22 DIAGNOSIS — Z006 Encounter for examination for normal comparison and control in clinical research program: Secondary | ICD-10-CM

## 2021-05-22 DIAGNOSIS — Z955 Presence of coronary angioplasty implant and graft: Secondary | ICD-10-CM | POA: Diagnosis present

## 2021-05-22 DIAGNOSIS — I252 Old myocardial infarction: Secondary | ICD-10-CM | POA: Diagnosis not present

## 2021-05-22 DIAGNOSIS — I214 Non-ST elevation (NSTEMI) myocardial infarction: Secondary | ICD-10-CM

## 2021-05-22 DIAGNOSIS — Z48812 Encounter for surgical aftercare following surgery on the circulatory system: Secondary | ICD-10-CM | POA: Insufficient documentation

## 2021-05-22 NOTE — Telephone Encounter (Signed)
I called patient about a new research study called [employer].which is looking at a new drug to lower LDL.Patient stated not interested at this time. Patient feeling well from cardiac rehab. ?

## 2021-05-22 NOTE — Progress Notes (Signed)
Discharge Progress Report ? ?Patient Details  ?Name: Penny Mccullough ?MRN: [id number] ?Date of Birth: [birth year] ?Referring Provider:   ?Flowsheet Row Cardiac Rehab from 02/16/2021 in [facility] Cardiac and Pulmonary Rehab  ?Referring Provider Gollan  ? ?  ? ? ? ?Number of Visits: 60 ? ?Reason for Discharge:  ?Patient reached a stable level of exercise. ?Patient independent in their exercise. ?Patient has met program and personal goals. ? ?Smoking History:  ?Social History  ? ?Tobacco Use  ?Smoking Status Former  ? Packs/day: 1.00  ? Years: 30.00  ? Pack years: 30.00  ? Types: Cigarettes  ? Quit date: 02/21/1987  ? Years since quitting: 34.2  ?Smokeless Tobacco Never  ? ? ?Diagnosis:  ?NSTEMI (non-ST elevation myocardial infarction) ([place]) ? ?Status post coronary artery stent placement ? ?ADL UCSD: ? ? ?Initial Exercise Prescription: ? Initial Exercise Prescription - 02/16/21 1500   ? ?  ? Date of Initial Exercise RX and Referring Provider  ? Date 02/16/21   ? Referring Provider Gollan   ?  ? Oxygen  ? Maintain Oxygen Saturation 88% or higher   ?  ? Treadmill  ? MPH 1.8   ? Grade 0.5   ? Minutes 15   ? METs 2.5   ?  ? NuStep  ? Level 1   ? SPM 80   ? Minutes 15   ? METs 2.4   ?  ? REL-XR  ? Level 1   ? Speed 50   ? Minutes 15   ? METs 2.4   ?  ? Biostep-RELP  ? Level 1   ? SPM 50   ? Minutes 15   ? METs 2   ?  ? Prescription Details  ? Frequency (times per week) 3   ? Duration Progress to 30 minutes of continuous aerobic without signs/symptoms of physical distress   ?  ? Intensity  ? THRR 40-80% of Max Heartrate 121-138   ? Ratings of Perceived Exertion 11-13   ? Perceived Dyspnea 0-4   ?  ? Resistance Training  ? Training Prescription Yes   ? Weight 3 lb   ? Reps 10-15   ? ?  ?  ? ?  ? ? ?Discharge Exercise Prescription (Final Exercise Prescription Changes): ? Exercise Prescription Changes - 05/08/21 1100   ? ?  ? Response to Exercise  ? Blood Pressure (Admit) 122/60   ? Blood Pressure (Exit) 102/60   ? Heart Rate (Admit) 64  bpm   ? Heart Rate (Exercise) 73 bpm   ? Heart Rate (Exit) 54 bpm   ? Rating of Perceived Exertion (Exercise) 14   ? Symptoms none   ? Duration Continue with 30 min of aerobic exercise without signs/symptoms of physical distress.   ? Intensity THRR unchanged   ?  ? Progression  ? Progression Continue to progress workloads to maintain intensity without signs/symptoms of physical distress.   ? Average METs 2.56   ?  ? Resistance Training  ? Training Prescription Yes   ? Weight 5 lb   ? Reps 10-15   ?  ? Interval Training  ? Interval Training No   ?  ? NuStep  ? Level 4   ? Minutes 15   ? METs 1.8   ?  ? REL-XR  ? Level 1   ? Minutes 15   ? METs 3.4   ?  ? Track  ? Laps 33   ? Minutes 15   ? METs 2.79   ?  ?  Home Exercise Plan  ? Plans to continue exercise at Home (comment)   walking, household items for weights  ? Frequency Add 2 additional days to program exercise sessions.   ? Initial Home Exercises Provided 03/30/21   ?  ? Oxygen  ? Maintain Oxygen Saturation 88% or higher   ? ?  ?  ? ?  ? ? ?Functional Capacity: ? 6 Minute Walk   ? ? [place] Name 02/16/21 1511 05/08/21 1645  ?  ?  ? 6 Minute Walk  ? Phase Initial Discharge   ? Distance 1070 feet 1295 feet   ? Distance % Change -- 21 %   ? Distance Feet Change -- 225 ft   ? Walk Time 6 minutes 6 minutes   ? # of Rest Breaks 0 0   ? MPH 2 2.45   ? METS 2.48 2.34   ? RPE 13 15   ? Perceived Dyspnea  2 0   ? VO2 Peak 8.67 8.2   ? Symptoms Yes (comment) No   ? Comments SOB --   ? Resting HR 105 bpm 55 bpm   ? Resting BP 122/72 112/60   ? Resting Oxygen Saturation  98 % 93 %   ? Exercise Oxygen Saturation  during 6 min walk 98 % 95 %   ? Max Ex. HR 119 bpm 75 bpm   ? Max Ex. BP 134/70 106/60   ? 2 Minute Post BP 112/68 --   ? ?  ?  ? ?  ? ? ?Psychological, QOL, Others - Outcomes: ?PHQ 2/9: ? ?  02/16/2021  ?  3:23 PM 01/24/2021  ? 12:13 PM  ?Depression screen PHQ 2/9  ?Decreased Interest 1 0  ?Down, Depressed, Hopeless 1 1  ?PHQ - 2 Score 2 1  ?Trouble concentrating 1    ?Moving slowly or fidgety/restless 1   ?Difficult doing work/chores Not difficult at all   ? ? ?Quality of Life: ? Quality of Life - 02/16/21 1520   ? ?  ? Quality of Life  ? [place] Quality of Life   ?  ? Quality of Life Scores  ? Health/Function Pre 24.04 %   ? Socioeconomic Pre 30 %   ? Psych/Spiritual Pre 25.71 %   ? Family Pre 30 %   ? GLOBAL Pre 26.34 %   ? ?  ?  ? ?  ? ? ? ?Nutrition & Weight - Outcomes: ? Pre Biometrics - 02/16/21 1519   ? ?  ? Pre Biometrics  ? Height 5' (1.524 m)   ? Weight 131 lb 14.4 oz (59.8 kg)   ? BMI (Calculated) 25.76   ? Single Leg Stand 6.33 seconds   ? ?  ?  ? ?  ? ? Post Biometrics - 05/08/21 1646   ? ?  ?  Post  Biometrics  ? Height 5' 0.75" (1.543 m)   ? Weight 124 lb 4.8 oz (56.4 kg)   ? BMI (Calculated) 23.68   ? ?  ?  ? ?  ? ? ?Nutrition: ? Nutrition Therapy & Goals - 03/01/21 1651   ? ?  ? Nutrition Therapy  ? RD appointment deferred Yes   Pt declined consultation with RD, will continue to follow up  ?  ? Personal Nutrition Goals  ? Nutrition Goal Pt declined consultation with RD, will continue to follow up   ? ?  ?  ? ?  ? ? ?Nutrition Discharge: ? ? ?Education Questionnaire  Score: ? ? ?Goals reviewed with patient; copy given to patient. ?

## 2021-05-22 NOTE — Progress Notes (Signed)
Cardiac Individual Treatment Plan ? ?Patient Details  ?Name: Penny Mccullough ?MRN: [id number] ?Date of Birth: 12-Aug-1945 ?Referring Provider:   ?Flowsheet Row Cardiac Rehab from 02/16/2021 in [facility] Cardiac and Pulmonary Rehab  ?Referring Provider Gollan  ? ?  ? ? ?Initial Encounter Date:  ?Flowsheet Row Cardiac Rehab from 02/16/2021 in [facility] Cardiac and Pulmonary Rehab  ?Date 02/16/21  ? ?  ? ? ?Visit Diagnosis: NSTEMI (non-ST elevation myocardial infarction) ([facility]) ? ?Status post coronary artery stent placement ? ?Patient's Home Medications on Admission: ? ?Current Outpatient Medications:  ?  albuterol (VENTOLIN HFA) 108 (90 Base) MCG/ACT inhaler, Inhale 2 puffs into the lungs every 6 (six) hours as needed for wheezing or shortness of breath., Disp: 8 g, Rfl: 0 ?  apixaban (ELIQUIS) 5 MG TABS tablet, Take 1 tablet (5 mg total) by mouth 2 (two) times daily., Disp: 90 tablet, Rfl: 3 ?  atorvastatin (LIPITOR) 80 MG tablet, Take 1 tablet (80 mg total) by mouth daily., Disp: 90 tablet, Rfl: 3 ?  clopidogrel (PLAVIX) 75 MG tablet, Take 1 tablet (75 mg total) by mouth daily., Disp: 92 tablet, Rfl: 3 ?  digoxin (LANOXIN) 0.125 MG tablet, Take 1 tablet (0.125 mg total) by mouth daily., Disp: 90 tablet, Rfl: 3 ?  empagliflozin (JARDIANCE) 10 MG TABS tablet, Take 10 mg by mouth daily., Disp: , Rfl:  ?  furosemide (LASIX) 20 MG tablet, Take 2 tablets (40 mg total) by mouth daily., Disp: 180 tablet, Rfl: 3 ?  metoprolol succinate (TOPROL XL) 50 MG 24 hr tablet, Take 1.5 tablets (75 mg total) by mouth daily., Disp: 140 tablet, Rfl: 3 ?  nitroGLYCERIN (NITROSTAT) 0.4 MG SL tablet, Place 1 tablet (0.4 mg total) under the tongue every 5 (five) minutes as needed for chest pain., Disp: 25 tablet, Rfl: 2 ?  potassium chloride (KLOR-CON) 10 MEQ tablet, Take 2 tablets (20 mEq total) by mouth daily., Disp: 180 tablet, Rfl: 3 ? ?Past Medical History: ?Past Medical History:  ?Diagnosis Date  ? CAD (coronary artery disease)   ? CHF (congestive  heart failure) (HCC)   ? COPD (chronic obstructive pulmonary disease) (HCC)   ? CVA (cerebral vascular accident) [facility])   ? HLD (hyperlipidemia)   ? Hypertension   ? MI (myocardial infarction) (HCC) 10/2020  ? ? ?Tobacco Use: ?Social History  ? ?Tobacco Use  ?Smoking Status Former  ? Packs/day: 1.00  ? Years: 30.00  ? Pack years: 30.00  ? Types: Cigarettes  ? Quit date: 02/21/1987  ? Years since quitting: 34.2  ?Smokeless Tobacco Never  ? ? ?Labs: ?Review Flowsheet   ? ?  ?  Latest Ref Rng & Units 10/27/2020 12/14/2020  ?Labs for [facility]P Cardiac and Pulmonary Rehab  ?Cholestrol 0 - 200 mg/dL 564   332    ?LDL (calc) 0 - 99 mg/dL 951   NOT CALCULATED  C  ?HDL-C >40 mg/dL 47   43    ?Trlycerides <150 mg/dL 884   166    ?Hemoglobin A1c 4.8 - 5.6 % 5.2     ?  ? ?C Corrected result  ? Multiple values from one day are sorted in reverse-chronological order  ?  ?  ? ? ? ?Exercise Target Goals: ?Exercise Program Goal: ?Individual exercise prescription set using results from initial 6 min walk test and THRR while considering  patient?s activity barriers and safety.  ? ?Exercise Prescription Goal: ?Initial exercise prescription builds to 30-45 minutes a day of aerobic activity, 2-3 days per week.  Home exercise guidelines will be given to patient during program as part of exercise prescription that the participant will acknowledge. ? ? ?Education: Aerobic Exercise: ?- Group verbal and visual presentation on the components of exercise prescription. Introduces F.I.T.T principle from ACSM for exercise prescriptions.  Reviews F.I.T.T. principles of aerobic exercise including progression. Written material given at graduation. ? ? ?Education: Resistance Exercise: ?- Group verbal and visual presentation on the components of exercise prescription. Introduces F.I.T.T principle from ACSM for exercise prescriptions  Reviews F.I.T.T. principles of resistance exercise including progression. Written material given at graduation. ? ?  ?Education:  Exercise & Equipment Safety: ?- Individual verbal instruction and demonstration of equipment use and safety with use of the equipment. ?Flowsheet Row Cardiac Rehab from 02/16/2021 in [facility] Cardiac and Pulmonary Rehab  ?Date 02/16/21  ?Educator AS  ?Instruction Review Code 1- Verbalizes Understanding  ? ?  ? ? ?Education: Exercise Physiology & General Exercise Guidelines: ?- Group verbal and written instruction with models to review the exercise physiology of the cardiovascular system and associated critical values. Provides general exercise guidelines with specific guidelines to those with heart or lung disease.  ?Flowsheet Row Cardiac Rehab from 02/16/2021 in [facility] Cardiac and Pulmonary Rehab  ?Education need identified 02/16/21  ? ?  ? ? ?Education: Flexibility, Balance, Mind/Body Relaxation: ?- Group verbal and visual presentation with interactive activity on the components of exercise prescription. Introduces F.I.T.T principle from ACSM for exercise prescriptions. Reviews F.I.T.T. principles of flexibility and balance exercise training including progression. Also discusses the mind body connection.  Reviews various relaxation techniques to help reduce and manage stress (i.e. Deep breathing, progressive muscle relaxation, and visualization). Balance handout provided to take home. Written material given at graduation. ? ? ?Activity Barriers & Risk Stratification: ? ? ?6 Minute Walk: ? 6 Minute Walk   ? ? Row Name 02/16/21 1511 05/08/21 1645  ?  ?  ? 6 Minute Walk  ? Phase Initial Discharge   ? Distance 1070 feet 1295 feet   ? Distance % Change -- 21 %   ? Distance Feet Change -- 225 ft   ? Walk Time 6 minutes 6 minutes   ? # of Rest Breaks 0 0   ? MPH 2 2.45   ? METS 2.48 2.34   ? RPE 13 15   ? Perceived Dyspnea  2 0   ? VO2 Peak 8.67 8.2   ? Symptoms Yes (comment) No   ? Comments SOB --   ? Resting HR 105 bpm 55 bpm   ? Resting BP 122/72 112/60   ? Resting Oxygen Saturation  98 % 93 %   ? Exercise Oxygen Saturation   during 6 min walk 98 % 95 %   ? Max Ex. HR 119 bpm 75 bpm   ? Max Ex. BP 134/70 106/60   ? 2 Minute Post BP 112/68 --   ? ?  ?  ? ?  ? ? ?Oxygen Initial Assessment: ? ? ?Oxygen Re-Evaluation: ? ? ?Oxygen Discharge (Final Oxygen Re-Evaluation): ? ? ?Initial Exercise Prescription: ? Initial Exercise Prescription - 02/16/21 1500   ? ?  ? Date of Initial Exercise RX and Referring Provider  ? Date 02/16/21   ? Referring Provider Gollan   ?  ? Oxygen  ? Maintain Oxygen Saturation 88% or higher   ?  ? Treadmill  ? MPH 1.8   ? Grade 0.5   ? Minutes 15   ? METs 2.5   ?  ?  NuStep  ? Level 1   ? SPM 80   ? Minutes 15   ? METs 2.4   ?  ? REL-XR  ? Level 1   ? Speed 50   ? Minutes 15   ? METs 2.4   ?  ? Biostep-RELP  ? Level 1   ? SPM 50   ? Minutes 15   ? METs 2   ?  ? Prescription Details  ? Frequency (times per week) 3   ? Duration Progress to 30 minutes of continuous aerobic without signs/symptoms of physical distress   ?  ? Intensity  ? THRR 40-80% of Max Heartrate 121-138   ? Ratings of Perceived Exertion 11-13   ? Perceived Dyspnea 0-4   ?  ? Resistance Training  ? Training Prescription Yes   ? Weight 3 lb   ? Reps 10-15   ? ?  ?  ? ?  ? ? ?Perform Capillary Blood Glucose checks as needed. ? ?Exercise Prescription Changes: ? ? Exercise Prescription Changes   ? ? Row Name 02/16/21 1500 02/27/21 1400 03/13/21 1200 03/27/21 1500 03/30/21 1600  ?  ? Response to Exercise  ? Blood Pressure (Admit) 122/72 124/62 102/58 94/58 --  ? Blood Pressure (Exercise) 134/70 126/64 130/64 100/60 --  ? Blood Pressure (Exit) 112/68 108/68 100/60 90/58 --  ? Heart Rate (Admit) 105 bpm 105 bpm 89 bpm 59 bpm --  ? Heart Rate (Exercise) 119 bpm 128 bpm 124 bpm 68 bpm --  ? Heart Rate (Exit) 108 bpm 100 bpm 113 bpm 56 bpm --  ? Oxygen Saturation (Admit) 98 % -- -- -- --  ? Oxygen Saturation (Exercise) 98 % -- -- -- --  ? Rating of Perceived Exertion (Exercise) 13 15 15 13  --  ? Perceived Dyspnea (Exercise) 2 -- -- -- --  ? Symptoms SOB -- none  none --  ? Comments -- second day -- -- --  ? Duration -- Progress to 30 minutes of  aerobic without signs/symptoms of physical distress Continue with 30 min of aerobic exercise without signs/symptoms of phy

## 2021-05-22 NOTE — Progress Notes (Signed)
Daily Session Note ? ?Patient Details  ?Name: Penny Mccullough ?MRN: [id number] ?Date of Birth: 1945-10-31 ?Referring Provider:   ?Flowsheet Row Cardiac Rehab from 02/16/2021 in [facility] Cardiac and Pulmonary Rehab  ?Referring Provider Gollan  ? ?  ? ? ?Encounter Date: 05/22/2021 ? ?Check In: ? Session Check In - 05/22/21 1554   ? ?  ? Check-In  ? Supervising physician immediately available to respond to emergencies See telemetry face sheet for immediately available ER MD   ? Location [facility]-Cardiac & Pulmonary Rehab   ? Staff Present Birdie Sons, MPA, Nino Glow, MS, ASCM CEP, Exercise Physiologist;Joseph Grayling, [place]   ? Virtual Visit No   ? Medication changes reported     No   ? Fall or balance concerns reported    No   ? Tobacco Cessation No Change   ? Warm-up and Cool-down Performed on first and last piece of equipment   ? Resistance Training Performed Yes   ? VAD Patient? No   ? PAD/SET Patient? No   ?  ? Pain Assessment  ? Currently in Pain? No/denies   ? ?  ?  ? ?  ? ? ? ? ? ?Social History  ? ?Tobacco Use  ?Smoking Status Former  ? Packs/day: 1.00  ? Years: 30.00  ? Pack years: 30.00  ? Types: Cigarettes  ? Quit date: 02/21/1987  ? Years since quitting: 34.2  ?Smokeless Tobacco Never  ? ? ?Goals Met:  ?Independence with exercise equipment ?Exercise tolerated well ?No report of concerns or symptoms today ?Strength training completed today ? ?Goals Unmet:  ?Not Applicable ? ?Comments:  Penny Mccullough graduated today from  rehab with 36 sessions completed.  Details of the patient's exercise prescription and what She needs to do in order to continue the prescription and progress were discussed with patient.  Patient was given a copy of prescription and goals.  Patient verbalized understanding.  Penny Mccullough plans to continue to exercise by walking at home. ? ? ? ? ?Dr. Emily Filbert is Medical Director for [facility].  ?Dr. Ottie Glazier is Medical Director for [facility] Pulmonary Rehabilitation. ?

## 2021-06-21 ENCOUNTER — Other Ambulatory Visit: Payer: Self-pay | Admitting: Medical

## 2021-08-12 ENCOUNTER — Other Ambulatory Visit: Payer: Self-pay | Admitting: Cardiovascular Disease

## 2021-08-12 DIAGNOSIS — I499 Cardiac arrhythmia, unspecified: Secondary | ICD-10-CM

## 2021-08-12 DIAGNOSIS — I480 Paroxysmal atrial fibrillation: Secondary | ICD-10-CM

## 2021-08-14 NOTE — Telephone Encounter (Signed)
Refill Request.  

## 2021-08-14 NOTE — Telephone Encounter (Signed)
Prescription refill request for Eliquis received. Indication:  PAF Last office visit: 04/10/21  Concha Se MD Scr: 0.75 on 04/10/21 Age: 76 Weight: 57.3kg  Based on above findings Eliquis 5mg  twice daily is the appropriate dose.  Refill approved.

## 2021-10-08 NOTE — Progress Notes (Unsigned)
Cardiology Office Note  Date:  10/09/2021   ID:  Venda Dice, DOB 1945/05/18, MRN [id number]  PCP:  Pcp, No   Chief Complaint  Patient presents with   6 month follow up     Patient c/o occasional rapid heart beats and shortness of breath with exertion. Medications reviewed by the patient verbally.     HPI:  Penny Mccullough is a 76 y.o. female with a hx of  CAD  remote CABG approximately 30 years ago (suspected to be a LIMA-LAD per history),  CVA,  HLD,  COPD /tobacco /chronic bronchitis Ejection fraction 45 to 50% Moderate MR, severely dilated left atrium NSTEMI s/p PCI to ostial and mid LAD 10/28/20  Hard of hearing Who presents for follow-up of her coronary disease,  PCI to ostial and mid LAD, atrial fibrillation (diagnosed December 2022)  Last seen in clinic by myself February 2023 Atrial fibrillation in December 2022 cardioversion Mar 16, 2021, normal sinus rhythm restored  Atrial fibrillation today "Heart racing" past month "Little SOB", does not feel it is bad overall feels relatively well Did not know that she was in atrial fibrillation today  Takes lasix/potassium 3-5 times a week Reports that she is taking metoprolol succinate 75 daily, compliant with her Eliquis  Prior echo  Left atrial size was severely dilated. Moderate MR  EKG personally reviewed by myself on todays visit Atrial fibrillation ventricular rate 96 bpm nonspecific T wave abnormality  History reviewed Last seen by myself in clinic February 06, 2021 Noted to be in atrial fibrillation at that time, rate 119 bpm, asymptomatic Entresto was held, started on low-dose digoxin, metoprolol succinate increased, Entresto held, Lasix 40 continued  September 2022 NSTEMI s/p PCI to ostial and mid LAD 10/28/20.   Echo showed EF 45-50% with HK of basal to mid inferior and inferolateral wall.   remote CABG x 1, at which time she was told "they took a main artery from my chest," and appears to be a LIMA to LAD.      PMH:   has a past medical history of CAD (coronary artery disease), CHF (congestive heart failure) (HCC), COPD (chronic obstructive pulmonary disease) (HCC), CVA (cerebral vascular accident) (HCC), HLD (hyperlipidemia), Hypertension, and MI (myocardial infarction) (HCC) (10/2020).  PSH:    Past Surgical History:  Procedure Laterality Date   CARDIOVERSION N/A 03/16/2021   Procedure: CARDIOVERSION;  Surgeon: Antonieta Iba, MD;  Location: [facility] ORS;  Service: Cardiovascular;  Laterality: N/A;   CORONARY ARTERY BYPASS GRAFT  1989   CORONARY STENT INTERVENTION N/A 10/28/2020   Procedure: CORONARY STENT INTERVENTION;  Surgeon: Alwyn Pea, MD;  Location: [facility] INVASIVE CV LAB;  Service: Cardiovascular;  Laterality: N/A;   LEFT HEART CATH AND CORONARY ANGIOGRAPHY N/A 10/28/2020   Procedure: LEFT HEART CATH AND CORONARY ANGIOGRAPHY;  Surgeon: Alwyn Pea, MD;  Location: [facility] INVASIVE CV LAB;  Service: Cardiovascular;  Laterality: N/A;    Current Outpatient Medications  Medication Sig Dispense Refill   albuterol (VENTOLIN HFA) 108 (90 Base) MCG/ACT inhaler Inhale 2 puffs into the lungs every 6 (six) hours as needed for wheezing or shortness of breath. 8 g 0   apixaban (ELIQUIS) 5 MG TABS tablet TAKE 1 TABLET BY MOUTH TWICE A DAY 90 tablet 1   atorvastatin (LIPITOR) 80 MG tablet Take 1 tablet (80 mg total) by mouth daily. 90 tablet 3   clopidogrel (PLAVIX) 75 MG tablet Take 1 tablet (75 mg total) by mouth daily. 92 tablet 3  digoxin (LANOXIN) 0.125 MG tablet Take 1 tablet (0.125 mg total) by mouth daily. 90 tablet 3   furosemide (LASIX) 20 MG tablet Take 2 tablets (40 mg total) by mouth daily. 180 tablet 3   JARDIANCE 10 MG TABS tablet TAKE 1 TABLET BY MOUTH DAILY BEFORE BREAKFAST. 30 tablet 6   metoprolol succinate (TOPROL XL) 50 MG 24 hr tablet Take 1.5 tablets (75 mg total) by mouth daily. 140 tablet 3   nitroGLYCERIN (NITROSTAT) 0.4 MG SL tablet Place 1 tablet (0.4 mg total)  under the tongue every 5 (five) minutes as needed for chest pain. 25 tablet 2   potassium chloride (KLOR-CON) 10 MEQ tablet Take 2 tablets (20 mEq total) by mouth daily. 180 tablet 3   No current facility-administered medications for this visit.    Allergies:   Codeine   Social History:  The patient  reports that she quit smoking about 34 years ago. Her smoking use included cigarettes. She has a 30.00 pack-year smoking history. She has never used smokeless tobacco. She reports that she does not currently use alcohol. She reports that she does not use drugs.   Family History:   family history includes Hypertension in her mother.    Review of Systems: Review of Systems  Constitutional: Negative.   HENT: Negative.    Respiratory: Negative.    Cardiovascular:  Positive for palpitations.  Gastrointestinal: Negative.   Musculoskeletal: Negative.   Neurological: Negative.   Psychiatric/Behavioral: Negative.    All other systems reviewed and are negative.    PHYSICAL EXAM: VS:  BP 110/80 (BP Location: Left Arm, Patient Position: Sitting, Cuff Size: Normal)   Pulse 96   Ht 5\' 1"  (1.549 m)   Wt 119 lb 8 oz (54.2 kg)   SpO2 93%   BMI 22.58 kg/m  , BMI Body mass index is 22.58 kg/m. Constitutional:  oriented to person, place, and time. No distress.  HENT:  Head: Grossly normal Eyes:  no discharge. No scleral icterus.  Neck: No JVD, no carotid bruits  Cardiovascular: Irregularly irregular,  no murmurs appreciated Pulmonary/Chest: Clear to auscultation bilaterally, no wheezes or rails Abdominal: Soft.  no distension.  no tenderness.  Musculoskeletal: Normal range of motion Neurological:  normal muscle tone. Coordination normal. No atrophy Skin: Skin warm and dry Psychiatric: normal affect, pleasant   Recent Labs: 12/14/2020: ALT 16 01/22/2021: B Natriuretic Peptide 1,905.6; Magnesium 2.2 03/07/2021: Hemoglobin 13.5; Platelets 383 04/10/2021: BUN 12; Creatinine, Ser 0.75;  Potassium 4.6; Sodium 142    Lipid Panel Lab Results  Component Value Date   CHOL 144 12/14/2020   HDL 43 12/14/2020   LDLCALC NOT CALCULATED 12/14/2020   TRIG 126 12/14/2020      Wt Readings from Last 3 Encounters:  10/09/21 119 lb 8 oz (54.2 kg)  05/08/21 124 lb 4.8 oz (56.4 kg)  04/10/21 126 lb 4 oz (57.3 kg)     ASSESSMENT AND PLAN:  Problem List Items Addressed This Visit       Cardiology Problems   CAD (coronary artery disease)     Other   COPD (chronic obstructive pulmonary disease) (HCC)   Relevant Orders   EKG 12-Lead   Other Visit Diagnoses     Paroxysmal atrial fibrillation (HCC)    -  Primary   Relevant Orders   EKG 12-Lead   Chronic systolic heart failure (HCC)       Relevant Orders   EKG 12-Lead   Essential hypertension  Palpitations         Coronary disease with stable angina Prior PCI to ostial and mid LAD On Plavix  on Eliquis for atrial fibrillation On statin, goal LDL less than 70 Currently with no symptoms of angina. No further workup at this time. Continue current medication regimen.  Atrial fibrillation with RVR Appear to start December 2022 Underwent cardioversion January 2023, was maintaining normal sinus rhythm on office visit February 2023 Back into atrial fibrillation today Relatively asymptomatic Discussed rate control versus rhythm control. She would prefer no additional medications and cardioversion at this time, Recommend we increase metoprolol succinate up to 50 twice daily On Eliquis 5 twice daily We will follow-up with her in 2 months time, if she is having worsening symptoms we may need amiodarone loading and repeat cardioversion  Palpitations/PVCs Asymptomatic with her PVCs    Total encounter time more than 30 minutes  Greater than 50% was spent in counseling and coordination of care with the patient    Signed, Dossie Arbour, M.D., Ph.D. [facility] [place], [place] [phone]

## 2021-10-09 ENCOUNTER — Ambulatory Visit (INDEPENDENT_AMBULATORY_CARE_PROVIDER_SITE_OTHER): Payer: Medicare Other | Admitting: Cardiovascular Disease

## 2021-10-09 ENCOUNTER — Encounter: Payer: Self-pay | Admitting: Cardiovascular Disease

## 2021-10-09 VITALS — BP 110/80 | HR 96 | Ht 61.0 in | Wt 119.5 lb

## 2021-10-09 DIAGNOSIS — I25118 Atherosclerotic heart disease of native coronary artery with other forms of angina pectoris: Secondary | ICD-10-CM

## 2021-10-09 DIAGNOSIS — I499 Cardiac arrhythmia, unspecified: Secondary | ICD-10-CM

## 2021-10-09 DIAGNOSIS — I480 Paroxysmal atrial fibrillation: Secondary | ICD-10-CM

## 2021-10-09 DIAGNOSIS — J449 Chronic obstructive pulmonary disease, unspecified: Secondary | ICD-10-CM

## 2021-10-09 DIAGNOSIS — I5022 Chronic systolic (congestive) heart failure: Secondary | ICD-10-CM

## 2021-10-09 DIAGNOSIS — I959 Hypotension, unspecified: Secondary | ICD-10-CM

## 2021-10-09 DIAGNOSIS — I10 Essential (primary) hypertension: Secondary | ICD-10-CM

## 2021-10-09 DIAGNOSIS — R002 Palpitations: Secondary | ICD-10-CM

## 2021-10-09 DIAGNOSIS — J432 Centrilobular emphysema: Secondary | ICD-10-CM

## 2021-10-09 MED ORDER — METOPROLOL SUCCINATE ER 50 MG PO TB24: 50.0000 mg | ORAL_TABLET | Freq: Two times a day (BID) | ORAL | 1 refills | Status: DC

## 2021-10-09 NOTE — Patient Instructions (Addendum)
You are in atrial fibrillation today  Medication Instructions:  - Your physician has recommended you make the following change in your medication:   1) INCREASE metoprolol succinate 50 mg: - take 1 tablet by mouth TWICE a day  If you need a refill on your cardiac medications before your next appointment, please call your pharmacy.    Lab work: No new labs needed   Testing/Procedures: No new testing needed   Follow-Up: At [facility], you and your health needs are our priority.  As part of our continuing mission to provide you with exceptional heart care, we have created designated Provider Care Teams.  These Care Teams include your primary Cardiologist (physician) and Advanced Practice Providers (APPs -  Physician Assistants and Nurse Practitioners) who all work together to provide you with the care you need, when you need it.  You will need a follow up appointment in 2 months  Providers on your designated Care Team:   Nicolasa Ducking, NP Eula Listen, PA-C Cadence Fransico Michael, [place]  COVID-19 Vaccine Information can be found at: [email or web address] For questions related to vaccine distribution or appointments, please email [email or web address] or call [phone].

## 2021-11-11 ENCOUNTER — Other Ambulatory Visit: Payer: Self-pay | Admitting: Cardiovascular Disease

## 2021-11-11 DIAGNOSIS — I480 Paroxysmal atrial fibrillation: Secondary | ICD-10-CM

## 2021-11-11 DIAGNOSIS — I499 Cardiac arrhythmia, unspecified: Secondary | ICD-10-CM

## 2021-11-13 NOTE — Telephone Encounter (Signed)
Refill request

## 2021-11-13 NOTE — Telephone Encounter (Signed)
Prescription refill request for Eliquis received. Indication: Afib  Last office visit:10/09/21 (Gollan)  Scr:0.75 (04/10/21)  Age: 76 Weight: 54.2kg  Appropriate dose and refill sent to requested pharmacy.

## 2021-12-12 ENCOUNTER — Ambulatory Visit: Payer: Medicare Other | Admitting: Medical

## 2022-01-02 ENCOUNTER — Ambulatory Visit: Payer: Medicare Other | Attending: Medical | Admitting: Medical

## 2022-01-02 ENCOUNTER — Encounter: Payer: Self-pay | Admitting: Medical

## 2022-01-02 ENCOUNTER — Telehealth: Payer: Self-pay

## 2022-01-02 ENCOUNTER — Other Ambulatory Visit
Admission: RE | Admit: 2022-01-02 | Discharge: 2022-01-02 | Disposition: A | Discharge status: Home / Self Care | Payer: Medicare Other | Source: Ambulatory Visit | Attending: Medical | Admitting: Medical

## 2022-01-02 VITALS — BP 92/60 | HR 90 | Ht 61.0 in | Wt 120.0 lb

## 2022-01-02 DIAGNOSIS — I499 Cardiac arrhythmia, unspecified: Secondary | ICD-10-CM | POA: Diagnosis not present

## 2022-01-02 DIAGNOSIS — Z79899 Other long term (current) drug therapy: Secondary | ICD-10-CM | POA: Diagnosis present

## 2022-01-02 DIAGNOSIS — I480 Paroxysmal atrial fibrillation: Secondary | ICD-10-CM

## 2022-01-02 DIAGNOSIS — I959 Hypotension, unspecified: Secondary | ICD-10-CM | POA: Diagnosis not present

## 2022-01-02 DIAGNOSIS — I255 Ischemic cardiomyopathy: Secondary | ICD-10-CM

## 2022-01-02 DIAGNOSIS — I5022 Chronic systolic (congestive) heart failure: Secondary | ICD-10-CM

## 2022-01-02 DIAGNOSIS — I25118 Atherosclerotic heart disease of native coronary artery with other forms of angina pectoris: Secondary | ICD-10-CM

## 2022-01-02 DIAGNOSIS — J449 Chronic obstructive pulmonary disease, unspecified: Secondary | ICD-10-CM

## 2022-01-02 LAB — BASIC METABOLIC PANEL
Anion gap: 7 (ref 5–15)
BUN: 15 mg/dL (ref 8–23)
CO2: 28 mmol/L (ref 22–32)
Calcium: 9.4 mg/dL (ref 8.9–10.3)
Chloride: 106 mmol/L (ref 98–111)
Creatinine, Ser: 0.85 mg/dL (ref 0.44–1.00)
GFR, Estimated: >60 mL/min (ref >60)
Glucose, Bld: 120 mg/dL — ABNORMAL HIGH (ref 70–99)
Potassium: 4.9 mmol/L (ref 3.5–5.1)
Sodium: 141 mmol/L (ref 135–145)

## 2022-01-02 LAB — MAGNESIUM: Magnesium: 2.2 mg/dL (ref 1.7–2.4)

## 2022-01-02 LAB — CBC
HCT: 42.4 % (ref 36.0–46.0)
Hemoglobin: 13.7 g/dL (ref 12.0–15.0)
MCH: 29.7 pg (ref 26.0–34.0)
MCHC: 32.3 g/dL (ref 30.0–36.0)
MCV: 91.8 fL (ref 80.0–100.0)
Platelets: 314 10*3/uL (ref 150–400)
RBC: 4.62 MIL/uL (ref 3.87–5.11)
RDW: 13.9 % (ref 11.5–15.5)
WBC: 5.5 10*3/uL (ref 4.0–10.5)
nRBC: 0 % (ref 0.0–0.2)

## 2022-01-02 MED ORDER — METOPROLOL SUCCINATE ER 50 MG PO TB24: 50.0000 mg | ORAL_TABLET | Freq: Every day | ORAL | 3 refills | Status: DC

## 2022-01-02 MED ORDER — METOPROLOL SUCCINATE ER 50 MG PO TB24: 50.0000 mg | ORAL_TABLET | Freq: Two times a day (BID) | ORAL | 1 refills | Status: DC

## 2022-01-02 MED ORDER — AMIODARONE HCL 200 MG PO TABS: ORAL_TABLET | ORAL | 0 refills | Status: DC

## 2022-01-02 NOTE — Telephone Encounter (Signed)
Pt made aware to stop digoxin as recommended by PA and verbalized understanding.

## 2022-01-02 NOTE — Patient Instructions (Addendum)
Medication Instructions:  DECREASE: Metoprolol to 50 mg once a day START: Amiodarone: 400 mg (2 tablets) in the morning and night for 1 week, then              Amiodarone 200 mg (1 tablet) in the morning and night for 1 week, then finally decrease to              Amiodarone 200 mg (1 tablet) once a day   *If you need a refill on your cardiac medications before your next appointment, please call your pharmacy*   Lab Work: Your provider would like for you to have the following labs drawn today: (BMP, Thyroid, Mg,. CBC).   Please go to the [facility] entrance and check in at the front desk.  You do not need an appointment.  They are open from 7am-6 pm.  You do not need to be fasting.  If you have labs (blood work) drawn today and your tests are completely normal, you will receive your results only by: MyChart Message (if you have MyChart) OR A paper copy in the mail If you have any lab test that is abnormal or we need to change your treatment, we will call you to review the results.   Testing/Procedures: None ordered today   Follow-Up: At [facility], you and your health needs are our priority.  As part of our continuing mission to provide you with exceptional heart care, we have created designated Provider Care Teams.  These Care Teams include your primary Cardiologist (physician) and Advanced Practice Providers (APPs -  Physician Assistants and Nurse Practitioners) who all work together to provide you with the care you need, when you need it.  We recommend signing up for the patient portal called "MyChart".  Sign up information is provided on this After Visit Summary.  MyChart is used to connect with patients for Virtual Visits (Telemedicine).  Patients are able to view lab/test results, encounter notes, upcoming appointments, etc.  Non-urgent messages can be sent to your provider as well.   To learn more about what you can do with MyChart, go to  [email or web address].    Your next appointment:   2 week(s)  The format for your next appointment:   In Person  Provider:   Cadence Fransico Michael, PA-C

## 2022-01-02 NOTE — Progress Notes (Signed)
Cardiology Office Note:    Date:  01/02/2022   ID:  Penny Mccullough, DOB December 28, 1945, MRN [id number]  PCP:  Pcp, No  [facility] Cardiologist:  None  [facility] Electrophysiologist:  None   Referring MD: No ref. provider found   Chief Complaint: 2 month follow-up  History of Present Illness:    Penny Mccullough is a 76 y.o. female with a hx of CAD with remote CABG approximately 30 years ago (suspected to be a LIMA-LAD per history) and subsequent stenting, CVA, HLD, and COPD with a long history of tobacco use who is being seen today for the evaluation of 2 month follow-up.    Penny Mccullough has a history of remote CABG x 1, at which time she was told "they took a main artery from my chest," and appears to be a LIMA to LAD.    Recently admitted for NSTEMI and is s/p PCI to ostial and mid LAD 10/28/20. Plan for DAPT with Aspirin and Brilinta for 12 months. Echo showed EF 45-50% with HK of basal to mid inferior and inferolateral wall. She was discharged on GDMT.    Patient was seen in the office 03/07/2021 and was found to be in A-fib with a rate of 90 bpm.  She was ultimately set up for cardioversion.  Patient underwent successful cardioversion on 03/16/21.  She was last seen 04/10/2021 was back in A-fib, she was relatively asymptomatic.  Toprol was increased.  Today, the patient is still in afib with controlled rates. She feels weak and tired. She denies palpitations, chest pain, shortness of breath. BP is low. No dizziness or lightheadedness. Patient is OK to start amiodarone load.   Past Medical History:  Diagnosis Date   CAD (coronary artery disease)    CHF (congestive heart failure) (HCC)    COPD (chronic obstructive pulmonary disease) (HCC)    CVA (cerebral vascular accident) ([place])    HLD (hyperlipidemia)    Hypertension    MI (myocardial infarction) ([place]) 10/2020    Past Surgical History:  Procedure Laterality Date   CARDIOVERSION N/A 03/16/2021   Procedure: CARDIOVERSION;   Surgeon: Minna Merritts, MD;  Location: [facility] ORS;  Service: Cardiovascular;  Laterality: N/A;   CORONARY ARTERY BYPASS GRAFT  1989   CORONARY STENT INTERVENTION N/A 10/28/2020   Procedure: CORONARY STENT INTERVENTION;  Surgeon: Yolonda Kida, MD;  Location: [place] [facility];  Service: Cardiovascular;  Laterality: N/A;   LEFT HEART CATH AND CORONARY ANGIOGRAPHY N/A 10/28/2020   Procedure: LEFT HEART CATH AND CORONARY ANGIOGRAPHY;  Surgeon: Yolonda Kida, MD;  Location: [place] [facility];  Service: Cardiovascular;  Laterality: N/A;    Current Medications: Current Meds  Medication Sig   albuterol (VENTOLIN HFA) 108 (90 Base) MCG/ACT inhaler Inhale 2 puffs into the lungs every 6 (six) hours as needed for wheezing or shortness of breath.   amiodarone (PACERONE) 200 MG tablet Take 400 mg (2 tablets) in the morning and night for 1 week, then 200 mg (1 tablet) in the morning and night for 1 week, then finally decrease to 200 mg (1 tablet) once a day   apixaban (ELIQUIS) 5 MG TABS tablet TAKE 1 TABLET BY MOUTH TWICE A DAY   atorvastatin (LIPITOR) 80 MG tablet Take 1 tablet (80 mg total) by mouth daily.   clopidogrel (PLAVIX) 75 MG tablet Take 1 tablet (75 mg total) by mouth daily.   furosemide (LASIX) 20 MG tablet Take 2 tablets (40 mg total) by mouth daily.  JARDIANCE 10 MG TABS tablet TAKE 1 TABLET BY MOUTH DAILY BEFORE BREAKFAST.   nitroGLYCERIN (NITROSTAT) 0.4 MG SL tablet Place 1 tablet (0.4 mg total) under the tongue every 5 (five) minutes as needed for chest pain.   potassium chloride (KLOR-CON) 10 MEQ tablet Take 2 tablets (20 mEq total) by mouth daily.   [DISCONTINUED] digoxin (LANOXIN) 0.125 MG tablet Take 1 tablet (0.125 mg total) by mouth daily.   [DISCONTINUED] metoprolol succinate (TOPROL XL) 50 MG 24 hr tablet Take 1 tablet (50 mg total) by mouth in the morning and at bedtime.     Allergies:   Codeine   Social History   Socioeconomic History   Marital status:  Single    Spouse name: Not on file   Number of children: Not on file   Years of education: Not on file   Highest education level: Not on file  Occupational History   Not on file  Tobacco Use   Smoking status: Former    Packs/day: 1.00    Years: 30.00    Total pack years: 30.00    Types: Cigarettes    Quit date: 02/21/1987    Years since quitting: 34.8   Smokeless tobacco: Never  Vaping Use   Vaping Use: Never used  Substance and Sexual Activity   Alcohol use: Not Currently   Drug use: Never   Sexual activity: Not Currently  Other Topics Concern   Not on file  Social History Narrative   Not on file   Social Determinants of Health   Financial Resource Strain: Not on file  Food Insecurity: Not on file  Transportation Needs: Not on file  Physical Activity: Not on file  Stress: Not on file  Social Connections: Not on file     Family History: The patient's family history includes Hypertension in her mother.  ROS:   Please see the history of present illness.     All other systems reviewed and are negative.  EKGs/Labs/Other Studies Reviewed:    The following studies were reviewed today:  Echo 10/2020 1. Left ventricular ejection fraction, by estimation, is 45 to 50%. The  left ventricle has mildly decreased function. The left ventricle  demonstrates regional wall motion abnormalities (see scoring  diagram/findings for description). There is mild  asymmetric left ventricular hypertrophy of the basal-septal segment. Left  ventricular diastolic parameters are consistent with Grade II diastolic  dysfunction (pseudonormalization). Elevated left ventricular end-diastolic  pressure. There is mild  hypokinesis of the left ventricular, basal-mid inferior wall and  inferolateral wall. The average left ventricular global longitudinal  strain is -12.4 %. The global longitudinal strain is abnormal.   2. Right ventricular systolic function is normal. The right ventricular  size is  normal. There is mildly elevated pulmonary artery systolic  pressure. The estimated right ventricular systolic pressure is 43.2 mmHg.   3. Left atrial size was severely dilated.   4. The mitral valve is abnormal. Moderate mitral valve regurgitation.  Mild mitral stenosis. Moderate mitral annular calcification.   5. The aortic valve is tricuspid. There is mild calcification of the  aortic valve. There is mild thickening of the aortic valve. Aortic valve  regurgitation is not visualized. Mild aortic valve sclerosis is present,  with no evidence of aortic valve  stenosis.   6. The inferior vena cava is normal in size with greater than 50%  respiratory variability, suggesting right atrial pressure of 3 mmHg.   Comparison(s): No prior Echocardiogram.   Cardiac cath 10/2020  Ost LAD lesion is 75% stenosed.   Mid LAD lesion is 99% stenosed.   A drug-eluting stent was successfully placed using a STENT ONYX FRONTIER 2.5X15.   A drug-eluting stent was successfully placed using a STENT ONYX FRONTIER 2.5X30.   A drug-eluting stent was successfully placed using a STENT ONYX FRONTIER 2.5X30.   Post intervention, there is a 0% residual stenosis.   Post intervention, there is a 0% residual stenosis.   The left ventricular systolic function is normal.   LV end diastolic pressure is normal.   Conclusion Cardiac cath Left main large free of disease Left ventricular function normal a EF of around 55% mild apical hypo- LAD large 75% ostial lesion 99% mid lesion ulcerated TIMI-3 flow Circumflex large left dominant free of disease RCA nondominant   Intervention PCI and stent to mid LAD with DES 2.5 x 30 mm DES PCI and stent to ostial LAD 2.5 x 15 mm DES Continue aspirin Brilinta for at least 12 months Angiomax for 2 hours then discontinue Cardiac care transferred back to [facility]. Dr Rockey Situ    Coronary Diagrams  Diagnostic Dominance: Left  Intervention       EKG:  EKG is ordered today.   The ekg ordered today demonstrates Afib, 90bpm, nonspecific ST/T wave changes  Recent Labs: 01/22/2021: B Natriuretic Peptide 1,905.6 01/02/2022: BUN 15; Creatinine, Ser 0.85; Hemoglobin 13.7; Magnesium 2.2; Platelets 314; Potassium 4.9; Sodium 141  Recent Lipid Panel    Component Value Date/Time   CHOL 144 12/14/2020 0822   TRIG 126 12/14/2020 0822   HDL 43 12/14/2020 0822   CHOLHDL 3.3 12/14/2020 0822   VLDL 25 12/14/2020 0822   LDLCALC NOT CALCULATED 12/14/2020 [id number]    Physical Exam:    VS:  BP 92/60 (BP Location: Left Arm, Patient Position: Sitting, Cuff Size: Normal)   Pulse 90   Ht 5\' 1"  (1.549 m)   Wt 120 lb (54.4 kg)   SpO2 97%   BMI 22.67 kg/m     Wt Readings from Last 3 Encounters:  01/02/22 120 lb (54.4 kg)  10/09/21 119 lb 8 oz (54.2 kg)  05/08/21 124 lb 4.8 oz (56.4 kg)     GEN: Well nourished, well developed in no acute distress HEENT: Normal NECK: No JVD; No carotid bruits LYMPHATICS: No lymphadenopathy CARDIAC: Irreg Irreg, no murmurs, rubs, gallops RESPIRATORY:  Clear to auscultation without rales, wheezing or rhonchi  ABDOMEN: Soft, non-tender, non-distended MUSCULOSKELETAL:  No edema; No deformity  SKIN: Warm and dry NEUROLOGIC:  Alert and oriented x 3 PSYCHIATRIC:  Normal affect   ASSESSMENT:    1. Paroxysmal atrial fibrillation (HCC)   2. Irregular heart rate   3. Hypotension, unspecified hypotension type   4. Medication management   5. Coronary artery disease of native artery of native heart with stable angina pectoris ([place])   6. Chronic obstructive pulmonary disease, unspecified COPD type ([place])   7. Chronic systolic heart failure ([place])   8. Ischemic cardiomyopathy    PLAN:    In order of problems listed above:  Paroxysmal A-fib Patient remains in A-fib with heart rate of 90 bpm.  She had previous failed cardioversion in 02/2021. Blood pressure is low today.  Patient reports overall weakness and fatigue.  No chest pain, shortness of  breath, palpitations. Patient is willing to try amiodarone.  She takes Eliquis 5 mg twice daily, denies missing doses. I will decrease Toprol to 50 mg daily for low blood pressure. I will start amiodarone 400  mg twice daily x1 week, 200 mg twice daily x1 week, 200 mg thereafter.  I will see her back in 2 weeks.  If patient is still in A-fib at that time we will set up for cardioversion.  I will update TSH, [place], mag, CBC.  CAD with remote CABG and prior stenting 10/2020 Patient denies chest pain.  Patient underwent stenting in September 2022 with PCI/DES to the mid LAD and ostial LAD.  She is on Plavix and Eliquis.  May stop Plavix at follow-up, will discuss with MD.No further ischemic work-up indicated at this time.   COPD She has rhonchi on exam, no wheezing. Continue follow-up with PCP.   HFmrEF ICM Echo 10/2020 showed LVEF 45-50%, mild asymmetric LVH, G2DD, mild hypokinesis of the left ventricular, basal-mid inferior wall and inferolateral wall, severely dilated, moderate MR. The patient is euvolemic on exam. I will stop Digoxin. I will decrease Toprol as above. Continue Jardiance. Can continue GDMT at follow-up, if BP allows. Also, may consider repeat echocardiogram. Continue lasix 40mg  daily.   Disposition: Follow up in 2 week(s) with MD/APP   Signed, Devika Dragovich Ninfa Meeker, PA-C  01/02/2022 12:06 PM    [facility]

## 2022-01-02 NOTE — Telephone Encounter (Signed)
Patient was returning call. Please advise ?

## 2022-01-02 NOTE — Telephone Encounter (Signed)
Called pt to inform Penny Mccullough, [place] would like for her to stop digoxin. Left message to call back

## 2022-01-04 LAB — THYROID PANEL WITH TSH
Free Thyroxine Index: 1.9 (ref 1.2–4.9)
T3 Uptake Ratio: 26 % (ref 24–39)
T4, Total: 7.3 ug/dL (ref 4.5–12.0)
TSH: 1.4 u[IU]/mL (ref 0.450–4.500)

## 2022-01-12 ENCOUNTER — Other Ambulatory Visit: Payer: Self-pay | Admitting: Cardiovascular Disease

## 2022-01-22 ENCOUNTER — Other Ambulatory Visit: Payer: Self-pay | Admitting: Medical

## 2022-01-22 NOTE — Progress Notes (Unsigned)
Cardiology Office Note:    Date:  01/23/2022   ID:  Penny Mccullough, DOB 1945-06-04, MRN [id number]  PCP:  Pcp, No  [facility] Cardiologist:  None  [facility] Electrophysiologist:  None   Referring MD: No ref. provider found   Chief Complaint: 2-3 week follow-up  History of Present Illness:    Penny Mccullough is a 76 y.o. female with a hx of CAD with remote CABG approximately 30 years ago (suspected to be a LIMA-LAD per history) and subsequent stenting, CVA, HLD, and COPD with a long history of tobacco use (quit 1989) who is being seen today for the evaluation of 2-3 week follow-up.    Penny Mccullough has a history of remote CABG x 1, at which time she was told "they took a main artery from my chest," and appears to be a LIMA to LAD.    Recently admitted for NSTEMI and is s/p PCI to ostial and mid LAD 10/28/20. Plan for DAPT with Aspirin and Brilinta for 12 months. Echo showed EF 45-50% with HK of basal to mid inferior and inferolateral wall. She was discharged on GDMT.    Patient was seen in the office 03/07/2021 and was found to be in A-fib with a rate of 90 bpm.  She was ultimately set up for cardioversion.  Patient underwent successful cardioversion on 03/16/21.   When she was seen 04/10/2021 was back in A-fib, she was relatively asymptomatic.  Toprol was increased.  She was last seen 01/02/22 and was in afib with controlled rates. She was started on amiodarone.   Today, the patient reports she is feeling better. She is in SB with heart rate of 47bpm. She does not remember when she went back into normal rhythm. She denies chest pain or shortness of breath. She denies lower leg edema, orthopnea or pnd.   Past Medical History:  Diagnosis Date   CAD (coronary artery disease)    CHF (congestive heart failure) (HCC)    COPD (chronic obstructive pulmonary disease) (HCC)    CVA (cerebral vascular accident) ([place])    HLD (hyperlipidemia)    Hypertension    MI (myocardial infarction) ([place])  10/2020    Past Surgical History:  Procedure Laterality Date   CARDIOVERSION N/A 03/16/2021   Procedure: CARDIOVERSION;  Surgeon: Minna Merritts, MD;  Location: [facility] ORS;  Service: Cardiovascular;  Laterality: N/A;   CORONARY ARTERY BYPASS GRAFT  1989   CORONARY STENT INTERVENTION N/A 10/28/2020   Procedure: CORONARY STENT INTERVENTION;  Surgeon: Yolonda Kida, MD;  Location: [place] [facility];  Service: Cardiovascular;  Laterality: N/A;   LEFT HEART CATH AND CORONARY ANGIOGRAPHY N/A 10/28/2020   Procedure: LEFT HEART CATH AND CORONARY ANGIOGRAPHY;  Surgeon: Yolonda Kida, MD;  Location: [place] [facility];  Service: Cardiovascular;  Laterality: N/A;    Current Medications: Current Meds  Medication Sig   albuterol (VENTOLIN HFA) 108 (90 Base) MCG/ACT inhaler Inhale 2 puffs into the lungs every 6 (six) hours as needed for wheezing or shortness of breath.   amiodarone (PACERONE) 200 MG tablet Take 1 tablet (200 mg total) by mouth daily. Take 1 tablet daily   apixaban (ELIQUIS) 5 MG TABS tablet TAKE 1 TABLET BY MOUTH TWICE A DAY   atorvastatin (LIPITOR) 80 MG tablet TAKE 1 TABLET BY MOUTH EVERY DAY   clopidogrel (PLAVIX) 75 MG tablet Take 1 tablet (75 mg total) by mouth daily.   furosemide (LASIX) 20 MG tablet Take 2 tablets (40 mg total)  by mouth daily.   JARDIANCE 10 MG TABS tablet TAKE 1 TABLET BY MOUTH DAILY BEFORE BREAKFAST.   metoprolol succinate (TOPROL-XL) 25 MG 24 hr tablet Take 0.5 tablet (12.5 mg) by mouth once daily   nitroGLYCERIN (NITROSTAT) 0.4 MG SL tablet Place 1 tablet (0.4 mg total) under the tongue every 5 (five) minutes as needed for chest pain.   potassium chloride (KLOR-CON) 10 MEQ tablet Take 2 tablets (20 mEq total) by mouth daily.   [DISCONTINUED] metoprolol succinate (TOPROL XL) 50 MG 24 hr tablet Take 1 tablet (50 mg total) by mouth daily.     Allergies:   Codeine   Social History   Socioeconomic History   Marital status: Single    Spouse  name: Not on file   Number of children: Not on file   Years of education: Not on file   Highest education level: Not on file  Occupational History   Not on file  Tobacco Use   Smoking status: Former    Packs/day: 1.00    Years: 30.00    Total pack years: 30.00    Types: Cigarettes    Quit date: 02/21/1987    Years since quitting: 34.9   Smokeless tobacco: Never  Vaping Use   Vaping Use: Never used  Substance and Sexual Activity   Alcohol use: Not Currently   Drug use: Never   Sexual activity: Not Currently  Other Topics Concern   Not on file  Social History Narrative   Not on file   Social Determinants of Health   Financial Resource Strain: Not on file  Food Insecurity: Not on file  Transportation Needs: Not on file  Physical Activity: Not on file  Stress: Not on file  Social Connections: Not on file     Family History: The patient's family history includes Hypertension in her mother.  ROS:   Please see the history of present illness.     All other systems reviewed and are negative.  EKGs/Labs/Other Studies Reviewed:    The following studies were reviewed today:   Echo 10/2020 1. Left ventricular ejection fraction, by estimation, is 45 to 50%. The  left ventricle has mildly decreased function. The left ventricle  demonstrates regional wall motion abnormalities (see scoring  diagram/findings for description). There is mild  asymmetric left ventricular hypertrophy of the basal-septal segment. Left  ventricular diastolic parameters are consistent with Grade II diastolic  dysfunction (pseudonormalization). Elevated left ventricular end-diastolic  pressure. There is mild  hypokinesis of the left ventricular, basal-mid inferior wall and  inferolateral wall. The average left ventricular global longitudinal  strain is -12.4 %. The global longitudinal strain is abnormal.   2. Right ventricular systolic function is normal. The right ventricular  size is normal. There  is mildly elevated pulmonary artery systolic  pressure. The estimated right ventricular systolic pressure is 43.2 mmHg.   3. Left atrial size was severely dilated.   4. The mitral valve is abnormal. Moderate mitral valve regurgitation.  Mild mitral stenosis. Moderate mitral annular calcification.   5. The aortic valve is tricuspid. There is mild calcification of the  aortic valve. There is mild thickening of the aortic valve. Aortic valve  regurgitation is not visualized. Mild aortic valve sclerosis is present,  with no evidence of aortic valve  stenosis.   6. The inferior vena cava is normal in size with greater than 50%  respiratory variability, suggesting right atrial pressure of 3 mmHg.   Comparison(s): No prior Echocardiogram.  Cardiac cath 10/2020      Ost LAD lesion is 75% stenosed.   Mid LAD lesion is 99% stenosed.   A drug-eluting stent was successfully placed using a STENT ONYX FRONTIER 2.5X15.   A drug-eluting stent was successfully placed using a STENT ONYX FRONTIER 2.5X30.   A drug-eluting stent was successfully placed using a STENT ONYX FRONTIER 2.5X30.   Post intervention, there is a 0% residual stenosis.   Post intervention, there is a 0% residual stenosis.   The left ventricular systolic function is normal.   LV end diastolic pressure is normal.   Conclusion Cardiac cath Left main large free of disease Left ventricular function normal a EF of around 55% mild apical hypo- LAD large 75% ostial lesion 99% mid lesion ulcerated TIMI-3 flow Circumflex large left dominant free of disease RCA nondominant   Intervention PCI and stent to mid LAD with DES 2.5 x 30 mm DES PCI and stent to ostial LAD 2.5 x 15 mm DES Continue aspirin Brilinta for at least 12 months Angiomax for 2 hours then discontinue Cardiac care transferred back to [facility]. Dr Rockey Situ    Coronary Diagrams   Diagnostic Dominance: Left  Intervention        EKG:  EKG is ordered today.  The ekg  ordered today demonstrates SB, 47bpm, Qtc 471ms, nonspecific T wave changes  Recent Labs: 01/02/2022: BUN 15; Creatinine, Ser 0.85; Hemoglobin 13.7; Magnesium 2.2; Platelets 314; Potassium 4.9; Sodium 141; TSH 1.400  Recent Lipid Panel    Component Value Date/Time   CHOL 144 12/14/2020 0822   TRIG 126 12/14/2020 0822   HDL 43 12/14/2020 0822   CHOLHDL 3.3 12/14/2020 0822   VLDL 25 12/14/2020 0822   LDLCALC NOT CALCULATED 12/14/2020 [id number]   Physical Exam:    VS:  BP 110/70 (BP Location: Left Arm, Patient Position: Sitting, Cuff Size: Normal)   Pulse (!) 47   Ht 5\' 1"  (1.549 m)   Wt 117 lb (53.1 kg)   SpO2 97%   BMI 22.11 kg/m     Wt Readings from Last 3 Encounters:  01/23/22 117 lb (53.1 kg)  01/02/22 120 lb (54.4 kg)  10/09/21 119 lb 8 oz (54.2 kg)     GEN:  Well nourished, well developed in no acute distress HEENT: Normal NECK: No JVD; No carotid bruits LYMPHATICS: No lymphadenopathy CARDIAC: Bradycardia, RR, no murmurs, rubs, gallops RESPIRATORY:  + rhonchi  ABDOMEN: Soft, non-tender, non-distended MUSCULOSKELETAL:  No edema; No deformity  SKIN: Warm and dry NEUROLOGIC:  Alert and oriented x 3 PSYCHIATRIC:  Normal affect   ASSESSMENT:    1. Paroxysmal atrial fibrillation (HCC)   2. Chronic systolic heart failure ([place])   3. Ischemic cardiomyopathy   4. Essential hypertension    PLAN:    In order of problems listed above:  Paroxysmal Afib She is in SB on amiodarone. She is taking amiodarone 200mg  daily. Heart rate is 47bpm. I will decrease Toprol to 12.5mg  daily. Continue Eliquis 5mg  BID for stroke ppx.   CAD with remote CABG and prior stenting 10/2020 Patient denies chest pain or shortness of breath. Patient underwent stenting in September 2022 with PCI/DES to the mid LAD and ostial LAD.  She is on Plavix and Eliquis, plan to continue both. No further ischemic workup at this time. Continue Lipitor and Toprol.  COPD Patient with remote tobacco use. No acute  exacerbation.  HFmrEF ICM Patient is euvolemic on exam.  I will decrease Toprol to 12.5 mg  daily for heart rate of 47bpm.  Continue Jardiance 10 mg daily.  Patient is not wanting to start another medication today.  I will recheck an echocardiogram.  Based off the EF, may consider continuing with guideline directed medical therapy at follow-up.  Disposition: Follow up in 3 month(s) with MD/APP    Signed, Makaiah Terwilliger David Stall, PA-C  01/23/2022 4:53 PM    [facility]

## 2022-01-23 ENCOUNTER — Ambulatory Visit: Payer: Medicare Other | Attending: Medical | Admitting: Medical

## 2022-01-23 ENCOUNTER — Encounter: Payer: Self-pay | Admitting: Medical

## 2022-01-23 VITALS — BP 110/70 | HR 47 | Ht 61.0 in | Wt 117.0 lb

## 2022-01-23 DIAGNOSIS — I480 Paroxysmal atrial fibrillation: Secondary | ICD-10-CM

## 2022-01-23 DIAGNOSIS — I255 Ischemic cardiomyopathy: Secondary | ICD-10-CM

## 2022-01-23 DIAGNOSIS — I5022 Chronic systolic (congestive) heart failure: Secondary | ICD-10-CM | POA: Diagnosis not present

## 2022-01-23 DIAGNOSIS — I10 Essential (primary) hypertension: Secondary | ICD-10-CM

## 2022-01-23 MED ORDER — METOPROLOL SUCCINATE ER 25 MG PO TB24: ORAL_TABLET | ORAL | 3 refills | Status: DC

## 2022-01-23 NOTE — Patient Instructions (Addendum)
Medication Instructions:  - Your physician has recommended you make the following change in your medication:   1) DECREASE Metoprolol succinate 25 mg: - take 0.5 tablet (12.5 mg) by mouth once daily   *If you need a refill on your cardiac medications before your next appointment, please call your pharmacy*   Lab Work: - none ordered  If you have labs (blood work) drawn today and your tests are completely normal, you will receive your results only by: MyChart Message (if you have MyChart) OR A paper copy in the mail If you have any lab test that is abnormal or we need to change your treatment, we will call you to review the results.   Testing/Procedures: 1) Echocardiogram: - Your physician has requested that you have an echocardiogram. Echocardiography is a painless test that uses sound waves to create images of your heart. It provides your doctor with information about the size and shape of your heart and how well your heart's chambers and valves are working. This procedure takes approximately one hour. There are no restrictions for this procedure. There is a possibility that an IV may need to be started during your test to inject an image enhancing agent. This is done to obtain more optimal pictures of your heart. Therefore we ask that you do at least drink some water prior to coming in to hydrate your veins.   Please do NOT wear cologne, perfume, aftershave, or lotions (deodorant is allowed). Please arrive 15 minutes prior to your appointment time.    Follow-Up: At [facility], you and your health needs are our priority.  As part of our continuing mission to provide you with exceptional heart care, we have created designated Provider Care Teams.  These Care Teams include your primary Cardiologist (physician) and Advanced Practice Providers (APPs -  Physician Assistants and Nurse Practitioners) who all work together to provide you with the care you need, when you need it.  We  recommend signing up for the patient portal called "MyChart".  Sign up information is provided on this After Visit Summary.  MyChart is used to connect with patients for Virtual Visits (Telemedicine).  Patients are able to view lab/test results, encounter notes, upcoming appointments, etc.  Non-urgent messages can be sent to your provider as well.   To learn more about what you can do with MyChart, go to [email or web address].    Your next appointment:   3 month(s)  The format for your next appointment:   In Person  Provider:   You may see [name], MD or one of the following Advanced Practice Providers on your designated Care Team:    Cadence Fransico Michael, [place]    Other Instructions  Echocardiogram An echocardiogram is a test that uses sound waves (ultrasound) to produce images of the heart. Images from an echocardiogram can provide important information about: Heart size and shape. The size and thickness and movement of your heart's walls. Heart muscle function and strength. Heart valve function or if you have stenosis. Stenosis is when the heart valves are too narrow. If blood is flowing backward through the heart valves (regurgitation). A tumor or infectious growth around the heart valves. Areas of heart muscle that are not working well because of poor blood flow or injury from a heart attack. Aneurysm detection. An aneurysm is a weak or damaged part of an artery wall. The wall bulges out from the normal force of blood pumping through the body. Tell a health care provider about: Any  allergies you have. All medicines you are taking, including vitamins, herbs, eye drops, creams, and over-the-counter medicines. Any blood disorders you have. Any surgeries you have had. Any medical conditions you have. Whether you are pregnant or may be pregnant. What are the risks? Generally, this is a safe test. However, problems may occur, including an allergic reaction to dye (contrast) that  may be used during the test. What happens before the test? No specific preparation is needed. You may eat and drink normally. What happens during the test?  You will take off your clothes from the waist up and put on a hospital gown. Electrodes or electrocardiogram (ECG)patches may be placed on your chest. The electrodes or patches are then connected to a device that monitors your heart rate and rhythm. You will lie down on a table for an ultrasound exam. A gel will be applied to your chest to help sound waves pass through your skin. A handheld device, called a transducer, will be pressed against your chest and moved over your heart. The transducer produces sound waves that travel to your heart and bounce back (or "echo" back) to the transducer. These sound waves will be captured in real-time and changed into images of your heart that can be viewed on a video monitor. The images will be recorded on a computer and reviewed by your health care provider. You may be asked to change positions or hold your breath for a short time. This makes it easier to get different views or better views of your heart. In some cases, you may receive contrast through an IV in one of your veins. This can improve the quality of the pictures from your heart. The procedure may vary among health care providers and hospitals. What can I expect after the test? You may return to your normal, everyday life, including diet, activities, and medicines, unless your health care provider tells you not to do that. Follow these instructions at home: It is up to you to get the results of your test. Ask your health care provider, or the department that is doing the test, when your results will be ready. Keep all follow-up visits. This is important. Summary An echocardiogram is a test that uses sound waves (ultrasound) to produce images of the heart. Images from an echocardiogram can provide important information about the size and shape  of your heart, heart muscle function, heart valve function, and other possible heart problems. You do not need to do anything to prepare before this test. You may eat and drink normally. After the echocardiogram is completed, you may return to your normal, everyday life, unless your health care provider tells you not to do that. This information is not intended to replace advice given to you by your health care provider. Make sure you discuss any questions you have with your health care provider. Document Revised: 10/19/2020 Document Reviewed: 09/29/2019 Elsevier Patient Education  2023 Elsevier Inc.   Important Information About Sugar

## 2022-01-27 ENCOUNTER — Other Ambulatory Visit: Payer: Self-pay | Admitting: Cardiovascular Disease

## 2022-01-27 DIAGNOSIS — I480 Paroxysmal atrial fibrillation: Secondary | ICD-10-CM

## 2022-02-03 ENCOUNTER — Other Ambulatory Visit: Payer: Self-pay | Admitting: Cardiovascular Disease

## 2022-02-07 ENCOUNTER — Ambulatory Visit: Payer: Medicare Other

## 2022-02-21 ENCOUNTER — Ambulatory Visit: Payer: Medicare Other | Attending: Medical

## 2022-02-21 DIAGNOSIS — I5022 Chronic systolic (congestive) heart failure: Secondary | ICD-10-CM | POA: Diagnosis not present

## 2022-02-21 DIAGNOSIS — I255 Ischemic cardiomyopathy: Secondary | ICD-10-CM

## 2022-02-21 LAB — ECHOCARDIOGRAM COMPLETE
AR max vel: 1.27 cm2
AV Area VTI: 1.31 cm2
AV Area mean vel: 1.2 cm2
AV Mean grad: 5 mmHg
AV Peak grad: 9 mmHg
Ao pk vel: 1.5 m/s
Area-P 1/2: 1.19 cm2
MV M vel: 5.08 m/s
MV Peak grad: 103.2 mmHg
MV VTI: 0.61 cm2
S' Lateral: 3.8 cm
Single Plane A4C EF: 66.1 %

## 2022-02-26 ENCOUNTER — Telehealth: Payer: Self-pay

## 2022-02-26 ENCOUNTER — Telehealth: Payer: Self-pay | Admitting: Medical

## 2022-02-26 DIAGNOSIS — I340 Nonrheumatic mitral (valve) insufficiency: Secondary | ICD-10-CM

## 2022-02-26 NOTE — Telephone Encounter (Signed)
I called and spoke with the patient. I have advised her of her echo results and Cadence Furth, PA's recommendations to have a referral to the structural heart team for her severe mitral valve stenosis.   The patient voices understanding and is agreeable. She states she drives, but does not drive to [place] as she does not feel comfortable doing that.   I have advised her that Lenice Llamas, RN nurse navigator will be in touch with her regarding an appointment.  The patient again voices understanding.

## 2022-02-26 NOTE — Telephone Encounter (Signed)
Cadence Ninfa Meeker, PA-C 02/26/2022 12:44 PM EST     Echo showed low normal pump function 45-50%, moderately impaired relaxation, severe MR. Can we refer her to the structural valve team for severe MR. This is new and worse from last echocardiogram.

## 2022-02-26 NOTE — Telephone Encounter (Signed)
Per Cadence Kathlen Mody, called to arrange Structural Heart consult.  Left message to call back.

## 2022-02-27 NOTE — Telephone Encounter (Signed)
Left message to call back  

## 2022-02-28 NOTE — Telephone Encounter (Signed)
Left message to call back  

## 2022-03-01 NOTE — Telephone Encounter (Signed)
Left message to call back.  Letter mailed to patient to contact the office.

## 2022-03-14 NOTE — Telephone Encounter (Signed)
The patient called back. Scheduled her for MR consult with Dr. Ali Lowe 04/11/22. She was grateful for assistance.

## 2022-04-09 NOTE — Progress Notes (Addendum)
  Patient ID: Penny Mccullough MRN: [id number] DOB/AGE: 11/09/1945 76 y.o.  Primary Care Physician:Pcp, No Primary Cardiologist: Gollan, Timothy   FOCUSED CARDIOVASCULAR PROBLEM LIST:   1.  Coronary artery disease status post LIMA to LAD 1989; coronary angiography 2022 demonstrated the LIMA graft to be atretic and the patient received proximal and mid LAD stents 2.  COPD 3.  Hyperlipidemia 4.  Hypertension 5.  Atrial fibrillation on Eliquis 6.  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 female with the indicated medical history here for recommendations regarding her severe mitral regurgitation.  The patient is here with her daughter.  She tells me that she has noticed increasing fatigue over the last several months.  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  She denies any significant shortness of breath on a regular basis but on occasion does wheeze.  She denies any exertional angina.  She denies any peripheral edema or paroxysmal nocturnal dyspnea.  She does suffer from some nuisance bruising at times.  She denies any significant or severe bleeding however.  She has not required any emergency room visits or hospitalizations.  She definitely feels like she cannot do as much versus last year.  In terms of her dental health she has both upper and lower dentures.  Past Medical History:  Diagnosis Date   CAD (coronary artery disease)    CHF (congestive heart failure) (HCC)    COPD (chronic obstructive pulmonary disease) (HCC)    CVA (cerebral vascular accident) (HCC)    HLD (hyperlipidemia)    Hypertension    MI (myocardial infarction) (HCC) 10/2020    Past Surgical History:  Procedure Laterality Date   CARDIOVERSION N/A 03/16/2021   Procedure: CARDIOVERSION;  Surgeon: Gollan, Timothy J, MD;  Location: [facility] ORS;  Service: Cardiovascular;  Laterality:  N/A;   CORONARY ARTERY BYPASS GRAFT  1989   CORONARY STENT INTERVENTION N/A 10/28/2020   Procedure: CORONARY STENT INTERVENTION;  Surgeon: Callwood, Dwayne D, MD;  Location: [facility] INVASIVE CV LAB;  Service: Cardiovascular;  Laterality: N/A;   LEFT HEART CATH AND CORONARY ANGIOGRAPHY N/A 10/28/2020   Procedure: LEFT HEART CATH AND CORONARY ANGIOGRAPHY;  Surgeon: Callwood, Dwayne D, MD;  Location: [facility] INVASIVE CV LAB;  Service: Cardiovascular;  Laterality: N/A;    Family History  Problem Relation Age of Onset   Hypertension Mother     Social History   Socioeconomic History   Marital status: Single    Spouse name: Not on file   Number of children: Not on file   Years of education: Not on file   Highest education level: Not on file  Occupational History   Not on file  Tobacco Use   Smoking status: Former    Packs/day: 1.00    Years: 30.00    Total pack years: 30.00    Types: Cigarettes    Quit date: 02/21/1987    Years since quitting: 35.1   Smokeless tobacco: Never  Vaping Use   Vaping Use: Never used  Substance and Sexual Activity   Alcohol use: Not Currently   Drug use: Never   Sexual activity: Not Currently  Other Topics Concern   Not on file  Social History Narrative   Not on file   Social Determinants of Health   Financial Resource Strain: Not on file  Food Insecurity: Not on file    Transportation Needs: Not on file  Physical Activity: Not on file  Stress: Not on file  Social Connections: Not on file  Intimate Partner Violence: Not on file     Prior to Admission medications   Medication Sig Start Date End Date Taking? Authorizing Provider  albuterol (VENTOLIN HFA) 108 (90 Base) MCG/ACT inhaler Inhale 2 puffs into the lungs every 6 (six) hours as needed for wheezing or shortness of breath. 01/09/21   Gollan, Timothy J, MD  amiodarone (PACERONE) 200 MG tablet Take 1 tablet (200 mg total) by mouth daily. Take 1 tablet daily 01/23/22   Gollan, Timothy J, MD  apixaban  (ELIQUIS) 5 MG TABS tablet TAKE 1 TABLET BY MOUTH TWICE A DAY 11/13/21   Gollan, Timothy J, MD  atorvastatin (LIPITOR) 80 MG tablet TAKE 1 TABLET BY MOUTH EVERY DAY 01/15/22   Gollan, Timothy J, MD  clopidogrel (PLAVIX) 75 MG tablet TAKE 1 TABLET BY MOUTH EVERY DAY 01/29/22   Gollan, Timothy J, MD  empagliflozin (JARDIANCE) 10 MG TABS tablet TAKE 1 TABLET BY MOUTH EVERY DAY BEFORE BREAKFAST 02/05/22   Gollan, Timothy J, MD  furosemide (LASIX) 20 MG tablet TAKE 2 TABLETS (40 MG TOTAL) BY MOUTH DAILY. 01/29/22   Gollan, Timothy J, MD  metoprolol succinate (TOPROL-XL) 25 MG 24 hr tablet Take 0.5 tablet (12.5 mg) by mouth once daily 01/23/22   Furth, Cadence H, PA-C  nitroGLYCERIN (NITROSTAT) 0.4 MG SL tablet Place 1 tablet (0.4 mg total) under the tongue every 5 (five) minutes as needed for chest pain. 12/09/20   Furth, Cadence H, PA-C  potassium chloride (KLOR-CON) 10 MEQ tablet Take 2 tablets (20 mEq total) by mouth daily. 03/21/21   Hackney, Tina A, FNP    Allergies  Allergen Reactions   Codeine Nausea And Vomiting    REVIEW OF SYSTEMS:  General: no fevers/chills/night sweats Eyes: no blurry vision, diplopia, or amaurosis ENT: no sore throat or hearing loss Resp: no cough, wheezing, or hemoptysis CV: no edema or palpitations GI: no abdominal pain, nausea, vomiting, diarrhea, or constipation GU: no dysuria, frequency, or hematuria Skin: no rash Neuro: no headache, numbness, tingling, or weakness of extremities Musculoskeletal: no joint pain or swelling Heme: no bleeding, DVT, or easy bruising Endo: no polydipsia or polyuria  BP 120/60   Pulse (!) 50   Ht 5' 1" (1.549 m)   Wt 125 lb 3.2 oz (56.8 kg)   SpO2 98%   BMI 23.66 kg/m   PHYSICAL EXAM: GEN:  AO x 3 in no acute distress HEENT: normal Dentition: Dentures Neck: JVP normal. +2 carotid upstrokes without bruits. No thyromegaly. Lungs: equal expansion, clear bilaterally CV: Apex is discrete and nondisplaced, RRR with 3 out of 6  holosystolic murmur Abd: soft, non-tender, non-distended; no bruit; positive bowel sounds Ext: no edema, ecchymoses, or cyanosis Vascular: 2+ femoral pulses, 2+ radial pulses       Skin: warm and dry without rash Neuro: CN II-XII grossly intact; motor and sensory grossly intact    DATA AND STUDIES:  EKG: EKG done today that I personally reviewed demonstrates sinus bradycardia Q-wave in V1  2D ECHO: January 2024 1. Left ventricular ejection fraction, by estimation, is 45 to 50%. The  left ventricle has mildly decreased function. The left ventricle  demonstrates global hypokinesis. There is mild left ventricular  hypertrophy. Left ventricular diastolic parameters  are consistent with Grade II diastolic dysfunction (pseudonormalization).   2. Right ventricular systolic function is normal. The right ventricular  size   is normal.   3. Left atrial size was severely dilated.   4. The mitral valve is normal in structure. Severe mitral valve  regurgitation.   5. The aortic valve is tricuspid. Aortic valve regurgitation is not  visualized. Aortic valve sclerosis is present, with no evidence of aortic  valve stenosis.   6. The inferior vena cava is normal in size with greater than 50%  respiratory variability, suggesting right atrial pressure of 3 mmHg.   TEE: n/a  CARDIAC CATH:  2022 LAD large 75% ostial lesion 99% mid lesion ulcerated TIMI-3 flow Circumflex large left dominant free of disease RCA nondominant -PCI and stent to mid LAD with DES 2.5 x 30 mm DES -PCI and stent to ostial LAD 2.5 x 15 mm DES  STS RISK CALCULATOR:   Procedure Type: Isolated MVR PERIOPERATIVE OUTCOME ESTIMATE % Operative Mortality 4.66% Morbidity & Mortality 19.4% Stroke 2.06% Renal Failure 2.29% Reoperation 3.63% Prolonged Ventilation 9.12% Deep Sternal Wound Infection 0.046% Long Hospital Stay (>14 days) 10.1% Short Hospital Stay (<6 days)* 20.9%   Procedure Type: Isolated MVr PERIOPERATIVE  OUTCOME ESTIMATE % Operative Mortality 1.61% Morbidity & Mortality 12.3% Stroke 2.12% Renal Failure 1.06% Reoperation 2.49% Prolonged Ventilation 5.93% Deep Sternal Wound Infection 0.039% Long Hospital Stay (>14 days) 6.09% Short Hospital Stay (<6 days)* 34.6%  NHYA CLASS: 2  ASSESSMENT AND PLAN:   Severe mitral valve regurgitation - Plan: EKG 12-Lead, Basic metabolic panel, CBC, AMB referral to CHF clinic  Paroxysmal atrial fibrillation (HCC) - Plan: EKG 12-Lead, Basic metabolic panel, CBC  Chronic systolic heart failure (HCC) - Plan: EKG 12-Lead, Basic metabolic panel, CBC  Ischemic cardiomyopathy - Plan: EKG 12-Lead, Basic metabolic panel, CBC  Coronary artery disease of native artery of native heart with stable angina pectoris (HCC) - Plan: EKG 12-Lead, Basic metabolic panel, CBC  The patient has developed severe symptomatic mitral regurgitation.  I believe she has developed a tethered posterior leaflet due to her LAD infarction sustained a few years ago leading to eccentric and what looks like severe mitral regurgitation.  I had a long conversation with the patient and her daughter about treatment options.  She is in normal sinus rhythm today so I do not think atrial fibrillation is influencing her symptoms currently.  I will refer the patient for a TEE to better characterize her mitral regurgitation, coronary angiography with right heart catheterization, and advanced heart failure opinion.  Depending on these pieces of data we will conceive a treatment plan but I think transcatheter mitral edge-to-edge repair will likely be feasible.  She has both upper and lower dentures so a dental evaluation is not necessary.   I have personally reviewed the patients imaging data as summarized above.  I have reviewed the natural history of mitral regurgitation with the patient and family members who are present today. We have discussed the limitations of medical therapy and the poor prognosis  associated with symptomatic mitral regurgitation. We have also reviewed potential treatment options, including palliative medical therapy, conventional mitral surgery, and transcatheter mitral edge-to-edge repair. We discussed treatment options in the context of this patient's specific comorbid medical conditions.   All of the patient's questions were answered today. Will make further recommendations based on the results of studies outlined above.   Total time spent with patient today 60 minutes. This includes reviewing records, evaluating the patient and coordinating care.   Orbie Pyo, MD  04/11/2022 3:27 PM    [facility] [address] [place],  , [place]  [address] Phone: [phone]; Fax: [phone]  

## 2022-04-09 NOTE — H&P (View-Only) (Signed)
  Patient ID: Penny Mccullough MRN: [id number] DOB/AGE: 11/09/1945 77 y.o.  Primary Care Physician:Pcp, No Primary Cardiologist: Gollan, Timothy   FOCUSED CARDIOVASCULAR PROBLEM LIST:   1.  Coronary artery disease status post LIMA to LAD 1989; coronary angiography 2022 demonstrated the LIMA graft to be atretic and the patient received proximal and mid LAD stents 2.  COPD 3.  Hyperlipidemia 4.  Hypertension 5.  Atrial fibrillation on Eliquis 6.  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 female with the indicated medical history here for recommendations regarding her severe mitral regurgitation.  The patient is here with her daughter.  She tells me that she has noticed increasing fatigue over the last several months.  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  She denies any significant shortness of breath on a regular basis but on occasion does wheeze.  She denies any exertional angina.  She denies any peripheral edema or paroxysmal nocturnal dyspnea.  She does suffer from some nuisance bruising at times.  She denies any significant or severe bleeding however.  She has not required any emergency room visits or hospitalizations.  She definitely feels like she cannot do as much versus last year.  In terms of her dental health she has both upper and lower dentures.  Past Medical History:  Diagnosis Date   CAD (coronary artery disease)    CHF (congestive heart failure) (HCC)    COPD (chronic obstructive pulmonary disease) (HCC)    CVA (cerebral vascular accident) (HCC)    HLD (hyperlipidemia)    Hypertension    MI (myocardial infarction) (HCC) 10/2020    Past Surgical History:  Procedure Laterality Date   CARDIOVERSION N/A 03/16/2021   Procedure: CARDIOVERSION;  Surgeon: Gollan, Timothy J, MD;  Location: [facility] ORS;  Service: Cardiovascular;  Laterality:  N/A;   CORONARY ARTERY BYPASS GRAFT  1989   CORONARY STENT INTERVENTION N/A 10/28/2020   Procedure: CORONARY STENT INTERVENTION;  Surgeon: Callwood, Dwayne D, MD;  Location: [facility] INVASIVE CV LAB;  Service: Cardiovascular;  Laterality: N/A;   LEFT HEART CATH AND CORONARY ANGIOGRAPHY N/A 10/28/2020   Procedure: LEFT HEART CATH AND CORONARY ANGIOGRAPHY;  Surgeon: Callwood, Dwayne D, MD;  Location: [facility] INVASIVE CV LAB;  Service: Cardiovascular;  Laterality: N/A;    Family History  Problem Relation Age of Onset   Hypertension Mother     Social History   Socioeconomic History   Marital status: Single    Spouse name: Not on file   Number of children: Not on file   Years of education: Not on file   Highest education level: Not on file  Occupational History   Not on file  Tobacco Use   Smoking status: Former    Packs/day: 1.00    Years: 30.00    Total pack years: 30.00    Types: Cigarettes    Quit date: 02/21/1987    Years since quitting: 35.1   Smokeless tobacco: Never  Vaping Use   Vaping Use: Never used  Substance and Sexual Activity   Alcohol use: Not Currently   Drug use: Never   Sexual activity: Not Currently  Other Topics Concern   Not on file  Social History Narrative   Not on file   Social Determinants of Health   Financial Resource Strain: Not on file  Food Insecurity: Not on file    Transportation Needs: Not on file  Physical Activity: Not on file  Stress: Not on file  Social Connections: Not on file  Intimate Partner Violence: Not on file     Prior to Admission medications   Medication Sig Start Date End Date Taking? Authorizing Provider  albuterol (VENTOLIN HFA) 108 (90 Base) MCG/ACT inhaler Inhale 2 puffs into the lungs every 6 (six) hours as needed for wheezing or shortness of breath. 01/09/21   Gollan, Timothy J, MD  amiodarone (PACERONE) 200 MG tablet Take 1 tablet (200 mg total) by mouth daily. Take 1 tablet daily 01/23/22   Gollan, Timothy J, MD  apixaban  (ELIQUIS) 5 MG TABS tablet TAKE 1 TABLET BY MOUTH TWICE A DAY 11/13/21   Gollan, Timothy J, MD  atorvastatin (LIPITOR) 80 MG tablet TAKE 1 TABLET BY MOUTH EVERY DAY 01/15/22   Gollan, Timothy J, MD  clopidogrel (PLAVIX) 75 MG tablet TAKE 1 TABLET BY MOUTH EVERY DAY 01/29/22   Gollan, Timothy J, MD  empagliflozin (JARDIANCE) 10 MG TABS tablet TAKE 1 TABLET BY MOUTH EVERY DAY BEFORE BREAKFAST 02/05/22   Gollan, Timothy J, MD  furosemide (LASIX) 20 MG tablet TAKE 2 TABLETS (40 MG TOTAL) BY MOUTH DAILY. 01/29/22   Gollan, Timothy J, MD  metoprolol succinate (TOPROL-XL) 25 MG 24 hr tablet Take 0.5 tablet (12.5 mg) by mouth once daily 01/23/22   Furth, Cadence H, PA-C  nitroGLYCERIN (NITROSTAT) 0.4 MG SL tablet Place 1 tablet (0.4 mg total) under the tongue every 5 (five) minutes as needed for chest pain. 12/09/20   Furth, Cadence H, PA-C  potassium chloride (KLOR-CON) 10 MEQ tablet Take 2 tablets (20 mEq total) by mouth daily. 03/21/21   Hackney, Tina A, FNP    Allergies  Allergen Reactions   Codeine Nausea And Vomiting    REVIEW OF SYSTEMS:  General: no fevers/chills/night sweats Eyes: no blurry vision, diplopia, or amaurosis ENT: no sore throat or hearing loss Resp: no cough, wheezing, or hemoptysis CV: no edema or palpitations GI: no abdominal pain, nausea, vomiting, diarrhea, or constipation GU: no dysuria, frequency, or hematuria Skin: no rash Neuro: no headache, numbness, tingling, or weakness of extremities Musculoskeletal: no joint pain or swelling Heme: no bleeding, DVT, or easy bruising Endo: no polydipsia or polyuria  BP 120/60   Pulse (!) 50   Ht 5' 1" (1.549 m)   Wt 125 lb 3.2 oz (56.8 kg)   SpO2 98%   BMI 23.66 kg/m   PHYSICAL EXAM: GEN:  AO x 3 in no acute distress HEENT: normal Dentition: Dentures Neck: JVP normal. +2 carotid upstrokes without bruits. No thyromegaly. Lungs: equal expansion, clear bilaterally CV: Apex is discrete and nondisplaced, RRR with 3 out of 6  holosystolic murmur Abd: soft, non-tender, non-distended; no bruit; positive bowel sounds Ext: no edema, ecchymoses, or cyanosis Vascular: 2+ femoral pulses, 2+ radial pulses       Skin: warm and dry without rash Neuro: CN II-XII grossly intact; motor and sensory grossly intact    DATA AND STUDIES:  EKG: EKG done today that I personally reviewed demonstrates sinus bradycardia Q-wave in V1  2D ECHO: January 2024 1. Left ventricular ejection fraction, by estimation, is 45 to 50%. The  left ventricle has mildly decreased function. The left ventricle  demonstrates global hypokinesis. There is mild left ventricular  hypertrophy. Left ventricular diastolic parameters  are consistent with Grade II diastolic dysfunction (pseudonormalization).   2. Right ventricular systolic function is normal. The right ventricular  size   is normal.   3. Left atrial size was severely dilated.   4. The mitral valve is normal in structure. Severe mitral valve  regurgitation.   5. The aortic valve is tricuspid. Aortic valve regurgitation is not  visualized. Aortic valve sclerosis is present, with no evidence of aortic  valve stenosis.   6. The inferior vena cava is normal in size with greater than 50%  respiratory variability, suggesting right atrial pressure of 3 mmHg.   TEE: n/a  CARDIAC CATH:  2022 LAD large 75% ostial lesion 99% mid lesion ulcerated TIMI-3 flow Circumflex large left dominant free of disease RCA nondominant -PCI and stent to mid LAD with DES 2.5 x 30 mm DES -PCI and stent to ostial LAD 2.5 x 15 mm DES  STS RISK CALCULATOR: pending  NHYA CLASS: 2  ASSESSMENT AND PLAN:   Severe mitral valve regurgitation - Plan: EKG 12-Lead, Basic metabolic panel, CBC, AMB referral to CHF clinic  Paroxysmal atrial fibrillation (HCC) - Plan: EKG 12-Lead, Basic metabolic panel, CBC  Chronic systolic heart failure (HCC) - Plan: EKG 12-Lead, Basic metabolic panel, CBC  Ischemic cardiomyopathy -  Plan: EKG 12-Lead, Basic metabolic panel, CBC  Coronary artery disease of native artery of native heart with stable angina pectoris (HCC) - Plan: EKG 12-Lead, Basic metabolic panel, CBC  The patient has developed severe symptomatic mitral regurgitation.  I believe she has developed a tethered posterior leaflet due to her LAD infarction sustained a few years ago leading to eccentric and what looks like severe mitral regurgitation.  I had a long conversation with the patient and her daughter about treatment options.  She is in normal sinus rhythm today so I do not think atrial fibrillation is influencing her symptoms currently.  I will refer the patient for a TEE to better characterize her mitral regurgitation, coronary angiography with right heart catheterization, and advanced heart failure opinion.  Depending on these pieces of data we will conceive a treatment plan but I think transcatheter mitral edge-to-edge repair will likely be feasible.  She has both upper and lower dentures so a dental evaluation is not necessary.   I have personally reviewed the patients imaging data as summarized above.  I have reviewed the natural history of mitral regurgitation with the patient and family members who are present today. We have discussed the limitations of medical therapy and the poor prognosis associated with symptomatic mitral regurgitation. We have also reviewed potential treatment options, including palliative medical therapy, conventional mitral surgery, and transcatheter mitral edge-to-edge repair. We discussed treatment options in the context of this patient's specific comorbid medical conditions.   All of the patient's questions were answered today. Will make further recommendations based on the results of studies outlined above.   Total time spent with patient today 60 minutes. This includes reviewing records, evaluating the patient and coordinating care.   Hollister Wessler K Vannie Hochstetler, MD  04/11/2022 3:27 PM     [facility] [address], [place], [place]  [address] Phone: [phone]; Fax: [phone]  

## 2022-04-09 NOTE — H&P (View-Only) (Signed)
Patient ID: Zauria Pitsenbarger MRN: [id number] DOB/AGE: 1945-12-29 77 y.o.  Primary Care Physician:Pcp, No Primary Cardiologist: Ida Rogue   FOCUSED CARDIOVASCULAR PROBLEM LIST:   1.  Coronary artery disease status post LIMA to LAD 1989; coronary angiography 2022 demonstrated the LIMA graft to be atretic and the patient received proximal and mid LAD stents 2.  COPD 3.  Hyperlipidemia 4.  Hypertension 5.  Atrial fibrillation on Eliquis 6.  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 female with the indicated medical history here for recommendations regarding her severe mitral regurgitation.  The patient is here with her daughter.  She tells me that she has noticed increasing fatigue over the last several months.  She works as a Engineer, building services once a week and notices that she cannot do as much without taking breaks.  She denies any significant shortness of breath on a regular basis but on occasion does wheeze.  She denies any exertional angina.  She denies any peripheral edema or paroxysmal nocturnal dyspnea.  She does suffer from some nuisance bruising at times.  She denies any significant or severe bleeding however.  She has not required any emergency room visits or hospitalizations.  She definitely feels like she cannot do as much versus last year.  In terms of her dental health she has both upper and lower dentures.  Past Medical History:  Diagnosis Date   CAD (coronary artery disease)    CHF (congestive heart failure) (HCC)    COPD (chronic obstructive pulmonary disease) (HCC)    CVA (cerebral vascular accident) ([place])    HLD (hyperlipidemia)    Hypertension    MI (myocardial infarction) ([place]) 10/2020    Past Surgical History:  Procedure Laterality Date   CARDIOVERSION N/A 03/16/2021   Procedure: CARDIOVERSION;  Surgeon: Minna Merritts, MD;  Location: [facility] ORS;  Service: Cardiovascular;  Laterality:  N/A;   CORONARY ARTERY BYPASS GRAFT  1989   CORONARY STENT INTERVENTION N/A 10/28/2020   Procedure: CORONARY STENT INTERVENTION;  Surgeon: Yolonda Kida, MD;  Location: [place] [facility];  Service: Cardiovascular;  Laterality: N/A;   LEFT HEART CATH AND CORONARY ANGIOGRAPHY N/A 10/28/2020   Procedure: LEFT HEART CATH AND CORONARY ANGIOGRAPHY;  Surgeon: Yolonda Kida, MD;  Location: [place] [facility];  Service: Cardiovascular;  Laterality: N/A;    Family History  Problem Relation Age of Onset   Hypertension Mother     Social History   Socioeconomic History   Marital status: Single    Spouse name: Not on file   Number of children: Not on file   Years of education: Not on file   Highest education level: Not on file  Occupational History   Not on file  Tobacco Use   Smoking status: Former    Packs/day: 1.00    Years: 30.00    Total pack years: 30.00    Types: Cigarettes    Quit date: 02/21/1987    Years since quitting: 35.1   Smokeless tobacco: Never  Vaping Use   Vaping Use: Never used  Substance and Sexual Activity   Alcohol use: Not Currently   Drug use: Never   Sexual activity: Not Currently  Other Topics Concern   Not on file  Social History Narrative   Not on file   Social Determinants of Health   Financial Resource Strain: Not on file  Food Insecurity: Not on file  Transportation Needs: Not on file  Physical Activity: Not on file  Stress: Not on file  Social Connections: Not on file  Intimate Partner Violence: Not on file     Prior to Admission medications   Medication Sig Start Date End Date Taking? Authorizing Provider  albuterol (VENTOLIN HFA) 108 (90 Base) MCG/ACT inhaler Inhale 2 puffs into the lungs every 6 (six) hours as needed for wheezing or shortness of breath. 01/09/21   Minna Merritts, MD  amiodarone (PACERONE) 200 MG tablet Take 1 tablet (200 mg total) by mouth daily. Take 1 tablet daily 01/23/22   Minna Merritts, MD  apixaban  (ELIQUIS) 5 MG TABS tablet TAKE 1 TABLET BY MOUTH TWICE A DAY 11/13/21   Minna Merritts, MD  atorvastatin (LIPITOR) 80 MG tablet TAKE 1 TABLET BY MOUTH EVERY DAY 01/15/22   Minna Merritts, MD  clopidogrel (PLAVIX) 75 MG tablet TAKE 1 TABLET BY MOUTH EVERY DAY 01/29/22   Minna Merritts, MD  empagliflozin (JARDIANCE) 10 MG TABS tablet TAKE 1 TABLET BY MOUTH EVERY DAY BEFORE BREAKFAST 02/05/22   Minna Merritts, MD  furosemide (LASIX) 20 MG tablet TAKE 2 TABLETS (40 MG TOTAL) BY MOUTH DAILY. 01/29/22   Minna Merritts, MD  metoprolol succinate (TOPROL-XL) 25 MG 24 hr tablet Take 0.5 tablet (12.5 mg) by mouth once daily 01/23/22   Furth, Cadence H, PA-C  nitroGLYCERIN (NITROSTAT) 0.4 MG SL tablet Place 1 tablet (0.4 mg total) under the tongue every 5 (five) minutes as needed for chest pain. 12/09/20   Furth, Cadence H, PA-C  potassium chloride (KLOR-CON) 10 MEQ tablet Take 2 tablets (20 mEq total) by mouth daily. 03/21/21   Alisa Graff, FNP    Allergies  Allergen Reactions   Codeine Nausea And Vomiting    REVIEW OF SYSTEMS:  General: no fevers/chills/night sweats Eyes: no blurry vision, diplopia, or amaurosis ENT: no sore throat or hearing loss Resp: no cough, wheezing, or hemoptysis CV: no edema or palpitations GI: no abdominal pain, nausea, vomiting, diarrhea, or constipation GU: no dysuria, frequency, or hematuria Skin: no rash Neuro: no headache, numbness, tingling, or weakness of extremities Musculoskeletal: no joint pain or swelling Heme: no bleeding, DVT, or easy bruising Endo: no polydipsia or polyuria  BP 120/60   Pulse (!) 50   Ht '5\' 1"'$  (1.549 m)   Wt 125 lb 3.2 oz (56.8 kg)   SpO2 98%   BMI 23.66 kg/m   PHYSICAL EXAM: GEN:  AO x 3 in no acute distress HEENT: normal Dentition: Dentures Neck: JVP normal. +2 carotid upstrokes without bruits. No thyromegaly. Lungs: equal expansion, clear bilaterally CV: Apex is discrete and nondisplaced, RRR with 3 out of 6  holosystolic murmur Abd: soft, non-tender, non-distended; no bruit; positive bowel sounds Ext: no edema, ecchymoses, or cyanosis Vascular: 2+ femoral pulses, 2+ radial pulses       Skin: warm and dry without rash Neuro: CN II-XII grossly intact; motor and sensory grossly intact    DATA AND STUDIES:  EKG: EKG done today that I personally reviewed demonstrates sinus bradycardia Q-wave in V1  2D ECHO: January 2024 1. Left ventricular ejection fraction, by estimation, is 45 to 50%. The  left ventricle has mildly decreased function. The left ventricle  demonstrates global hypokinesis. There is mild left ventricular  hypertrophy. Left ventricular diastolic parameters  are consistent with Grade II diastolic dysfunction (pseudonormalization).   2. Right ventricular systolic function is normal. The right ventricular  size  is normal.   3. Left atrial size was severely dilated.   4. The mitral valve is normal in structure. Severe mitral valve  regurgitation.   5. The aortic valve is tricuspid. Aortic valve regurgitation is not  visualized. Aortic valve sclerosis is present, with no evidence of aortic  valve stenosis.   6. The inferior vena cava is normal in size with greater than 50%  respiratory variability, suggesting right atrial pressure of 3 mmHg.   TEE: n/a  CARDIAC CATH:  2022 LAD large 75% ostial lesion 99% mid lesion ulcerated TIMI-3 flow Circumflex large left dominant free of disease RCA nondominant -PCI and stent to mid LAD with DES 2.5 x 30 mm DES -PCI and stent to ostial LAD 2.5 x 15 mm DES  STS RISK CALCULATOR: pending  NHYA CLASS: 2  ASSESSMENT AND PLAN:   Severe mitral valve regurgitation - Plan: EKG XX123456, Basic metabolic panel, CBC, AMB referral to CHF clinic  Paroxysmal atrial fibrillation ([place]) - Plan: EKG XX123456, Basic metabolic panel, CBC  Chronic systolic heart failure ([place]) - Plan: EKG XX123456, Basic metabolic panel, CBC  Ischemic cardiomyopathy -  Plan: EKG XX123456, Basic metabolic panel, CBC  Coronary artery disease of native artery of native heart with stable angina pectoris (HCC) - Plan: EKG XX123456, Basic metabolic panel, CBC  The patient has developed severe symptomatic mitral regurgitation.  I believe she has developed a tethered posterior leaflet due to her LAD infarction sustained a few years ago leading to eccentric and what looks like severe mitral regurgitation.  I had a long conversation with the patient and her daughter about treatment options.  She is in normal sinus rhythm today so I do not think atrial fibrillation is influencing her symptoms currently.  I will refer the patient for a TEE to better characterize her mitral regurgitation, coronary angiography with right heart catheterization, and advanced heart failure opinion.  Depending on these pieces of data we will conceive a treatment plan but I think transcatheter mitral edge-to-edge repair will likely be feasible.  She has both upper and lower dentures so a dental evaluation is not necessary.   I have personally reviewed the patients imaging data as summarized above.  I have reviewed the natural history of mitral regurgitation with the patient and family members who are present today. We have discussed the limitations of medical therapy and the poor prognosis associated with symptomatic mitral regurgitation. We have also reviewed potential treatment options, including palliative medical therapy, conventional mitral surgery, and transcatheter mitral edge-to-edge repair. We discussed treatment options in the context of this patient's specific comorbid medical conditions.   All of the patient's questions were answered today. Will make further recommendations based on the results of studies outlined above.   Total time spent with patient today 60 minutes. This includes reviewing records, evaluating the patient and coordinating care.   Early Osmond, MD  04/11/2022 3:27 PM     [facility] [place], [place], [place]  [address] Phone: [phone]; Fax: [phone]

## 2022-04-11 ENCOUNTER — Ambulatory Visit: Payer: 59 | Attending: Internal Medicine | Admitting: Internal Medicine

## 2022-04-11 ENCOUNTER — Encounter: Payer: Self-pay | Admitting: Internal Medicine

## 2022-04-11 VITALS — BP 120/60 | HR 50 | Ht 61.0 in | Wt 125.2 lb

## 2022-04-11 DIAGNOSIS — I340 Nonrheumatic mitral (valve) insufficiency: Secondary | ICD-10-CM

## 2022-04-11 DIAGNOSIS — I255 Ischemic cardiomyopathy: Secondary | ICD-10-CM

## 2022-04-11 DIAGNOSIS — I5022 Chronic systolic (congestive) heart failure: Secondary | ICD-10-CM

## 2022-04-11 DIAGNOSIS — I480 Paroxysmal atrial fibrillation: Secondary | ICD-10-CM | POA: Diagnosis not present

## 2022-04-11 DIAGNOSIS — I25118 Atherosclerotic heart disease of native coronary artery with other forms of angina pectoris: Secondary | ICD-10-CM

## 2022-04-11 NOTE — Patient Instructions (Addendum)
Medication Instructions:  No changes *If you need a refill on your cardiac medications before your next appointment, please call your pharmacy*   Lab Work: Today: cbc, bmet   Testing/Procedures: Your physician has requested that you have a cardiac catheterization. Cardiac catheterization is used to diagnose and/or treat various heart conditions. Doctors may recommend this procedure for a number of different reasons. The most common reason is to evaluate chest pain. Chest pain can be a symptom of coronary artery disease (CAD), and cardiac catheterization can show whether plaque is narrowing or blocking your heart's arteries. This procedure is also used to evaluate the valves, as well as measure the blood flow and oxygen levels in different parts of your heart. For further information please visit [email or web address]. Please follow instruction sheet, as given.  Your physician has requested that you have a TEE. During a TEE, sound waves are used to create images of your heart. It provides your doctor with information about the size and shape of your heart and how well your heart's chambers and valves are working. In this test, a transducer is attached to the end of a flexible tube that's guided down your throat and into your esophagus (the tube leading from you mouth to your stomach) to get a more detailed image of your heart. You are not awake for the procedure. Please see the instruction sheet given to you today. For further information please visit [email or web address].   Follow-Up: Per Structural Heart Team  You have been referred to Advanced Heart Failure Clinic          Cardiac/Peripheral Catheterization   You are scheduled for a Cardiac Catheterization on Wednesday, February 28 with Dr. Lenna Sciara.  1. Please arrive at the Main Entrance A at [facility]: [place], [place] [address] on February 28 at 8:00 AM (This time is two hours before your procedure to  ensure your preparation). Free valet parking service is available. You will check in at ADMITTING. The support person will be asked to wait in the waiting room.  It is OK to have someone drop you off and come back when you are ready to be discharged.        Special note: Every effort is made to have your procedure done on time. Please understand that emergencies sometimes delay scheduled procedures.   . 2. Diet: Do not eat solid foods after midnight.  You may have clear liquids until 5 AM the day of the procedure.  3. Labs: You will need to have blood drawn today.  You do not need to be fasting.  4. Medication instructions in preparation for your procedure:   Contrast Allergy: No   DO NOT take Eliquis AFTER evening dose on 04/15/22. DO NOT take Lasix and Jardiance on day 04/18/22.   On the morning of your procedure, take clopidogrel (Plavix) 75 mg and any morning medicines NOT listed above.  You may use sips of water.  5. Plan to go home the same day, you will only stay overnight if medically necessary. 6. You MUST have a responsible adult to drive you home. 7. An adult MUST be with you the first 24 hours after you arrive home. 8. Bring a current list of your medications, and the last time and date medication taken. 9. Bring ID and current insurance cards. 10.Please wear clothes that are easy to get on and off and wear slip-on shoes.  Thank you for allowing [place] to care for you!   --  [facility]    TEE INSTRUCTIONS: You are scheduled for a TEE on Tuesday 05/01/22 with Dr. Johnsie Cancel.  Please arrive at the [facility] (Main Entrance A) at [facility]: [address] [place], [place] [address] at 9:00 am.  Dennis Bast are allowed ONE visitor in the waiting room during your procedure. Both you and your guest must wear masks.  DIET: Nothing to eat or drink after midnight except a sip of water with medications.  MEDICATION INSTRUCTIONS: 1) Hold Lasix that  morning 2) You may take your other medications as directed with sips of water.  LABS:  For patients receiving anesthesia for TEE: BMET, CBC within 1 week --will do at hospital  You must have a responsible person to drive you home and stay in the waiting area during your procedure. Failure to do so could result in cancellation.  Bring your insurance cards.  *Special Note: Every effort is made to have your procedure done on time. Occasionally there are emergencies that occur at the hospital that may cause delays. Please be patient if a delay does occur.

## 2022-04-12 LAB — CBC
Hematocrit: 38.8 % (ref 34.0–46.6)
Hemoglobin: 12.8 g/dL (ref 11.1–15.9)
MCH: 29.8 pg (ref 26.6–33.0)
MCHC: 33 g/dL (ref 31.5–35.7)
MCV: 90 fL (ref 79–97)
Platelets: 315 10*3/uL (ref 150–450)
RBC: 4.3 x10E6/uL (ref 3.77–5.28)
RDW: 13.4 % (ref 11.7–15.4)
WBC: 7.4 10*3/uL (ref 3.4–10.8)

## 2022-04-12 LAB — BASIC METABOLIC PANEL
BUN/Creatinine Ratio: 17 (ref 12–28)
BUN: 13 mg/dL (ref 8–27)
CO2: 22 mmol/L (ref 20–29)
Calcium: 9.3 mg/dL (ref 8.7–10.3)
Chloride: 104 mmol/L (ref 96–106)
Creatinine, Ser: 0.77 mg/dL (ref 0.57–1.00)
Glucose: 91 mg/dL (ref 70–99)
Potassium: 4.9 mmol/L (ref 3.5–5.2)
Sodium: 138 mmol/L (ref 134–144)
eGFR: 80 mL/min/{1.73_m2} (ref >59)

## 2022-04-14 ENCOUNTER — Other Ambulatory Visit: Payer: Self-pay | Admitting: Cardiovascular Disease

## 2022-04-16 ENCOUNTER — Telehealth: Payer: Self-pay | Admitting: *Deleted

## 2022-04-16 NOTE — Telephone Encounter (Addendum)
Cardiac Catheterization scheduled at [facility] for: Wednesday April 18, 2022 10 AM Arrival time and place: [facility] Main Entrance A at: 8 AM  Nothing to eat after midnight prior to procedure, clear liquids until 5 AM day of procedure.  Medication instructions -Hold:  Eliquis-pt reports last dose AM 04/16/22 until post procedure  Jardiance/Lasix/KCl-AM of procedure  -Other usual morning medications can be taken with sips of water including aspirin 81 mg and Plavix 75 mg.  Confirmed patient has responsible adult to drive home post procedure and be with patient first 24 hours after arriving home.  Patient reports no new symptoms concerning for COVID-19 in the past 10 days.  Reviewed procedure instructions with patient.

## 2022-04-17 ENCOUNTER — Telehealth (HOSPITAL_COMMUNITY): Payer: Self-pay | Admitting: Vascular Surgery

## 2022-04-17 NOTE — Telephone Encounter (Signed)
Lvm to make new chf appt w/ either provider

## 2022-04-18 ENCOUNTER — Encounter (HOSPITAL_COMMUNITY): Payer: Self-pay | Admitting: Internal Medicine

## 2022-04-18 ENCOUNTER — Other Ambulatory Visit: Payer: Self-pay

## 2022-04-18 ENCOUNTER — Encounter (HOSPITAL_COMMUNITY)
Admission: RE | Disposition: A | Discharge status: Home / Self Care | Payer: Self-pay | Source: Ambulatory Visit | Attending: Internal Medicine

## 2022-04-18 ENCOUNTER — Ambulatory Visit (HOSPITAL_COMMUNITY)
Admission: RE | Admit: 2022-04-18 | Discharge: 2022-04-18 | Disposition: A | Discharge status: Home / Self Care | Payer: 59 | Source: Ambulatory Visit | Attending: Internal Medicine | Admitting: Internal Medicine

## 2022-04-18 DIAGNOSIS — Z955 Presence of coronary angioplasty implant and graft: Secondary | ICD-10-CM | POA: Insufficient documentation

## 2022-04-18 DIAGNOSIS — I4819 Other persistent atrial fibrillation: Secondary | ICD-10-CM | POA: Insufficient documentation

## 2022-04-18 DIAGNOSIS — I340 Nonrheumatic mitral (valve) insufficiency: Secondary | ICD-10-CM | POA: Insufficient documentation

## 2022-04-18 DIAGNOSIS — I25118 Atherosclerotic heart disease of native coronary artery with other forms of angina pectoris: Secondary | ICD-10-CM | POA: Insufficient documentation

## 2022-04-18 DIAGNOSIS — Z7901 Long term (current) use of anticoagulants: Secondary | ICD-10-CM | POA: Diagnosis not present

## 2022-04-18 DIAGNOSIS — Z87891 Personal history of nicotine dependence: Secondary | ICD-10-CM | POA: Diagnosis not present

## 2022-04-18 DIAGNOSIS — I5022 Chronic systolic (congestive) heart failure: Secondary | ICD-10-CM | POA: Diagnosis not present

## 2022-04-18 DIAGNOSIS — I255 Ischemic cardiomyopathy: Secondary | ICD-10-CM | POA: Insufficient documentation

## 2022-04-18 DIAGNOSIS — J449 Chronic obstructive pulmonary disease, unspecified: Secondary | ICD-10-CM | POA: Diagnosis not present

## 2022-04-18 DIAGNOSIS — I2510 Atherosclerotic heart disease of native coronary artery without angina pectoris: Secondary | ICD-10-CM | POA: Diagnosis not present

## 2022-04-18 DIAGNOSIS — I110 Hypertensive heart disease with heart failure: Secondary | ICD-10-CM | POA: Insufficient documentation

## 2022-04-18 DIAGNOSIS — E785 Hyperlipidemia, unspecified: Secondary | ICD-10-CM | POA: Diagnosis not present

## 2022-04-18 HISTORY — PX: RIGHT/LEFT HEART CATH AND CORONARY ANGIOGRAPHY: CATH118266

## 2022-04-18 LAB — POCT I-STAT 7, (LYTES, BLD GAS, ICA,H+H)
Acid-Base Excess: 0 mmol/L (ref 0.0–2.0)
Bicarbonate: 24.4 mmol/L (ref 20.0–28.0)
Calcium, Ion: 1.18 mmol/L (ref 1.15–1.40)
HCT: 36 % (ref 36.0–46.0)
Hemoglobin: 12.2 g/dL (ref 12.0–15.0)
O2 Saturation: 90 %
Potassium: 4 mmol/L (ref 3.5–5.1)
Sodium: 141 mmol/L (ref 135–145)
TCO2: 26 mmol/L (ref 22–32)
pCO2 arterial: 39.2 mmHg (ref 32–48)
pH, Arterial: 7.402 (ref 7.35–7.45)
pO2, Arterial: 58 mmHg — ABNORMAL LOW (ref 83–108)

## 2022-04-18 LAB — POCT I-STAT EG7
Acid-Base Excess: 1 mmol/L (ref 0.0–2.0)
Acid-Base Excess: 1 mmol/L (ref 0.0–2.0)
Acid-Base Excess: 1 mmol/L (ref 0.0–2.0)
Bicarbonate: 26.2 mmol/L (ref 20.0–28.0)
Bicarbonate: 26.4 mmol/L (ref 20.0–28.0)
Bicarbonate: 26.8 mmol/L (ref 20.0–28.0)
Calcium, Ion: 1.19 mmol/L (ref 1.15–1.40)
Calcium, Ion: 1.21 mmol/L (ref 1.15–1.40)
Calcium, Ion: 1.23 mmol/L (ref 1.15–1.40)
HCT: 36 % (ref 36.0–46.0)
HCT: 36 % (ref 36.0–46.0)
HCT: 36 % (ref 36.0–46.0)
Hemoglobin: 12.2 g/dL (ref 12.0–15.0)
Hemoglobin: 12.2 g/dL (ref 12.0–15.0)
Hemoglobin: 12.2 g/dL (ref 12.0–15.0)
O2 Saturation: 61 %
O2 Saturation: 63 %
O2 Saturation: 68 %
Potassium: 4 mmol/L (ref 3.5–5.1)
Potassium: 4 mmol/L (ref 3.5–5.1)
Potassium: 4 mmol/L (ref 3.5–5.1)
Sodium: 140 mmol/L (ref 135–145)
Sodium: 141 mmol/L (ref 135–145)
Sodium: 141 mmol/L (ref 135–145)
TCO2: 28 mmol/L (ref 22–32)
TCO2: 28 mmol/L (ref 22–32)
TCO2: 28 mmol/L (ref 22–32)
pCO2, Ven: 44 mmHg (ref 44–60)
pCO2, Ven: 44.2 mmHg (ref 44–60)
pCO2, Ven: 45.3 mmHg (ref 44–60)
pH, Ven: 7.373 (ref 7.25–7.43)
pH, Ven: 7.383 (ref 7.25–7.43)
pH, Ven: 7.39 (ref 7.25–7.43)
pO2, Ven: 33 mmHg (ref 32–45)
pO2, Ven: 33 mmHg (ref 32–45)
pO2, Ven: 36 mmHg (ref 32–45)

## 2022-04-18 SURGERY — RIGHT/LEFT HEART CATH AND CORONARY ANGIOGRAPHY
Anesthesia: LOCAL

## 2022-04-18 MED ORDER — SODIUM CHLORIDE 0.9% FLUSH: 3.0000 mL | Freq: Two times a day (BID) | INTRAVENOUS | Status: DC

## 2022-04-18 MED ORDER — HEPARIN (PORCINE) IN NACL 1000-0.9 UT/500ML-% IV SOLN
INTRAVENOUS | Status: DC | PRN
  Administered 2022-04-18 (×2): 500 mL

## 2022-04-18 MED ORDER — SODIUM CHLORIDE 0.9 % WEIGHT BASED INFUSION
3.0000 mL/kg/h | INTRAVENOUS | Status: AC
  Administered 2022-04-18: 3 mL/kg/h via INTRAVENOUS

## 2022-04-18 MED ORDER — SODIUM CHLORIDE 0.9 % IV SOLN: 250.0000 mL | INTRAVENOUS | Status: DC | PRN

## 2022-04-18 MED ORDER — LIDOCAINE HCL (PF) 1 % IJ SOLN
INTRAMUSCULAR | Status: AC
  Filled 2022-04-18: qty 30

## 2022-04-18 MED ORDER — FENTANYL CITRATE (PF) 100 MCG/2ML IJ SOLN
INTRAMUSCULAR | Status: AC
  Filled 2022-04-18: qty 2

## 2022-04-18 MED ORDER — CLOPIDOGREL BISULFATE 75 MG PO TABS: 75.0000 mg | ORAL_TABLET | ORAL | Status: DC

## 2022-04-18 MED ORDER — ACETAMINOPHEN 325 MG PO TABS: 650.0000 mg | ORAL_TABLET | ORAL | Status: DC | PRN

## 2022-04-18 MED ORDER — MIDAZOLAM HCL 2 MG/2ML IJ SOLN
INTRAMUSCULAR | Status: AC
  Filled 2022-04-18: qty 2

## 2022-04-18 MED ORDER — HEPARIN SODIUM (PORCINE) 1000 UNIT/ML IJ SOLN
INTRAMUSCULAR | Status: DC | PRN
  Administered 2022-04-18: 5000 [IU] via INTRA_ARTERIAL

## 2022-04-18 MED ORDER — LIDOCAINE HCL (PF) 1 % IJ SOLN
INTRAMUSCULAR | Status: DC | PRN
  Administered 2022-04-18 (×2): 2 mL

## 2022-04-18 MED ORDER — FENTANYL CITRATE (PF) 100 MCG/2ML IJ SOLN
INTRAMUSCULAR | Status: DC | PRN
  Administered 2022-04-18: 25 ug via INTRAVENOUS

## 2022-04-18 MED ORDER — IOHEXOL 350 MG/ML SOLN
INTRAVENOUS | Status: DC | PRN
  Administered 2022-04-18: 30 mL via INTRA_ARTERIAL

## 2022-04-18 MED ORDER — SODIUM CHLORIDE 0.9% FLUSH: 3.0000 mL | INTRAVENOUS | Status: DC | PRN

## 2022-04-18 MED ORDER — ASPIRIN 81 MG PO CHEW: 81.0000 mg | CHEWABLE_TABLET | ORAL | Status: DC

## 2022-04-18 MED ORDER — MIDAZOLAM HCL 2 MG/2ML IJ SOLN
INTRAMUSCULAR | Status: DC | PRN
  Administered 2022-04-18: 1 mg via INTRAVENOUS

## 2022-04-18 MED ORDER — SODIUM CHLORIDE 0.9 % WEIGHT BASED INFUSION: 1.0000 mL/kg/h | INTRAVENOUS | Status: DC

## 2022-04-18 MED ORDER — LABETALOL HCL 5 MG/ML IV SOLN: 10.0000 mg | INTRAVENOUS | Status: DC | PRN

## 2022-04-18 MED ORDER — HEPARIN SODIUM (PORCINE) 1000 UNIT/ML IJ SOLN
INTRAMUSCULAR | Status: AC
  Filled 2022-04-18: qty 10

## 2022-04-18 MED ORDER — VERAPAMIL HCL 2.5 MG/ML IV SOLN
INTRAVENOUS | Status: DC | PRN
  Administered 2022-04-18: 10 mL via INTRA_ARTERIAL

## 2022-04-18 MED ORDER — ONDANSETRON HCL 4 MG/2ML IJ SOLN: 4.0000 mg | Freq: Four times a day (QID) | INTRAMUSCULAR | Status: DC | PRN

## 2022-04-18 MED ORDER — HYDRALAZINE HCL 20 MG/ML IJ SOLN: 10.0000 mg | INTRAMUSCULAR | Status: DC | PRN

## 2022-04-18 MED ORDER — VERAPAMIL HCL 2.5 MG/ML IV SOLN
INTRAVENOUS | Status: AC
  Filled 2022-04-18: qty 2

## 2022-04-18 SURGICAL SUPPLY — 14 items
CATH 5FR PIGTAIL DIAGNOSTIC (CATHETERS) IMPLANT
CATH BALLN WEDGE 5F 110CM (CATHETERS) IMPLANT
CATH DIAG 6FR JR4 (CATHETERS) IMPLANT
CATH OPTITORQUE TIG 4.0 6F (CATHETERS) IMPLANT
DEVICE RAD TR BAND REGULAR (VASCULAR PRODUCTS) IMPLANT
GLIDESHEATH SLEND SS 6F .021 (SHEATH) IMPLANT
GUIDEWIRE .025 260CM (WIRE) IMPLANT
GUIDEWIRE ANGLED .035X150CM (WIRE) IMPLANT
KIT HEART LEFT (KITS) ×1 IMPLANT
PACK CARDIAC CATHETERIZATION (CUSTOM PROCEDURE TRAY) ×1 IMPLANT
SHEATH GLIDE SLENDER 4/5FR (SHEATH) IMPLANT
TRANSDUCER W/STOPCOCK (MISCELLANEOUS) ×1 IMPLANT
TUBING CIL FLEX 10 FLL-RA (TUBING) ×1 IMPLANT
WIRE EMERALD 3MM-J .035X260CM (WIRE) IMPLANT

## 2022-04-18 NOTE — Progress Notes (Addendum)
Per Dr Ali Lowe client may resume eliquis tomorrow; client and her daughter notified and voiced understanding

## 2022-04-18 NOTE — Interval H&P Note (Signed)
History and Physical Interval Note:  04/18/2022 8:30 AM  Penny Mccullough  has presented today for surgery, with the diagnosis of severe MR.  The various methods of treatment have been discussed with the patient and family. After consideration of risks, benefits and other options for treatment, the patient has consented to  Procedure(s): RIGHT/LEFT HEART CATH AND CORONARY ANGIOGRAPHY (N/A) as a surgical intervention.  The patient's history has been reviewed, patient examined, no change in status, stable for surgery.  I have reviewed the patient's chart and labs.  Questions were answered to the patient's satisfaction.     Early Osmond

## 2022-04-23 ENCOUNTER — Other Ambulatory Visit: Payer: Self-pay | Admitting: Cardiovascular Disease

## 2022-04-23 DIAGNOSIS — I499 Cardiac arrhythmia, unspecified: Secondary | ICD-10-CM

## 2022-04-23 DIAGNOSIS — I480 Paroxysmal atrial fibrillation: Secondary | ICD-10-CM

## 2022-04-24 NOTE — Telephone Encounter (Signed)
Prescription refill request for Eliquis received. Indication: afib  Last office visit: Thukkani, 04/11/2022 Scr: 0.77, 04/11/2022 Age: 77 Weight: 56.7 kg    Refill sent.

## 2022-04-24 NOTE — Telephone Encounter (Signed)
Refill request

## 2022-04-26 ENCOUNTER — Other Ambulatory Visit: Payer: Self-pay | Admitting: Cardiovascular Disease

## 2022-04-26 DIAGNOSIS — I480 Paroxysmal atrial fibrillation: Secondary | ICD-10-CM

## 2022-04-26 NOTE — Telephone Encounter (Signed)
Can you please let me know if patient should be on both plavix and Eliquis.

## 2022-04-26 NOTE — Telephone Encounter (Signed)
Pt scheduled on 5/14

## 2022-04-26 NOTE — Telephone Encounter (Signed)
Please reschedule 3 month F/U apppt. Thank you!

## 2022-04-30 NOTE — Anesthesia Preprocedure Evaluation (Signed)
Anesthesia Evaluation  Patient identified by MRN, date of birth, ID band Patient awake    Reviewed: Allergy & Precautions, NPO status , Patient's Chart, lab work & pertinent test results, reviewed documented beta blocker date and time   Airway Mallampati: III  TM Distance: >3 FB Neck ROM: Full    Dental  (+) Edentulous Upper, Edentulous Lower, Dental Advisory Given   Pulmonary COPD,  COPD inhaler, former smoker   Pulmonary exam normal breath sounds clear to auscultation       Cardiovascular hypertension, Pt. on home beta blockers and Pt. on medications + CAD, + Past MI and +CHF  Normal cardiovascular exam+ Valvular Problems/Murmurs (severe MR) MR  Rhythm:Regular Rate:Normal  TTE 2024  1. Left ventricular ejection fraction, by estimation, is 45 to 50%. The  left ventricle has mildly decreased function. The left ventricle  demonstrates global hypokinesis. There is mild left ventricular  hypertrophy. Left ventricular diastolic parameters  are consistent with Grade II diastolic dysfunction (pseudonormalization).   2. Right ventricular systolic function is normal. The right ventricular  size is normal.   3. Left atrial size was severely dilated.   4. The mitral valve is normal in structure. Severe mitral valve  regurgitation.   5. The aortic valve is tricuspid. Aortic valve regurgitation is not  visualized. Aortic valve sclerosis is present, with no evidence of aortic  valve stenosis.   6. The inferior vena cava is normal in size with greater than 50%  respiratory variability, suggesting right atrial pressure of 3 mmHg.   Cath 2024 1.  Patent proximal and mid LAD stents with mild disease elsewhere. 2.  LVEDP of 12 mmHg. 3.  Mean RA pressure of 1 mmHg, RV pressure 50/-2 with RV end-diastolic pressure of 4 mmHg, mean wedge pressure of 15 mmHg with V waves to 33 mmHg, and PA pressure 55/15 with a mean of 27 mmHg.  The cardiac output  was 5.38 L/min and the cardiac index was 3.47 L/min/m.    Neuro/Psych CVA  negative psych ROS   GI/Hepatic negative GI ROS, Neg liver ROS,,,  Endo/Other    Renal/GU negative Renal ROS  negative genitourinary   Musculoskeletal negative musculoskeletal ROS (+)    Abdominal   Peds  Hematology negative hematology ROS (+) Plavix, eliquis   Anesthesia Other Findings   Reproductive/Obstetrics                             Anesthesia Physical Anesthesia Plan  ASA: 3  Anesthesia Plan: MAC   Post-op Pain Management:    Induction: Intravenous  PONV Risk Score and Plan: Propofol infusion and Treatment may vary due to age or medical condition  Airway Management Planned: Natural Airway  Additional Equipment:   Intra-op Plan:   Post-operative Plan:   Informed Consent: I have reviewed the patients History and Physical, chart, labs and discussed the procedure including the risks, benefits and alternatives for the proposed anesthesia with the patient or authorized representative who has indicated his/her understanding and acceptance.     Dental advisory given  Plan Discussed with: CRNA  Anesthesia Plan Comments:        Anesthesia Quick Evaluation

## 2022-05-01 ENCOUNTER — Ambulatory Visit (HOSPITAL_BASED_OUTPATIENT_CLINIC_OR_DEPARTMENT_OTHER): Payer: 59 | Admitting: Anesthesiology

## 2022-05-01 ENCOUNTER — Ambulatory Visit (HOSPITAL_COMMUNITY)
Admission: RE | Admit: 2022-05-01 | Discharge: 2022-05-01 | Disposition: A | Discharge status: Home / Self Care | Payer: 59 | Source: Ambulatory Visit | Attending: Cardiovascular Disease | Admitting: Cardiovascular Disease

## 2022-05-01 ENCOUNTER — Ambulatory Visit: Payer: Medicare Other | Admitting: Cardiovascular Disease

## 2022-05-01 ENCOUNTER — Ambulatory Visit (HOSPITAL_BASED_OUTPATIENT_CLINIC_OR_DEPARTMENT_OTHER)
Admission: RE | Admit: 2022-05-01 | Discharge: 2022-05-01 | Disposition: A | Discharge status: Home / Self Care | Payer: 59 | Source: Ambulatory Visit | Attending: Cardiovascular Disease | Admitting: Cardiovascular Disease

## 2022-05-01 ENCOUNTER — Ambulatory Visit (HOSPITAL_COMMUNITY): Payer: 59 | Admitting: Anesthesiology

## 2022-05-01 ENCOUNTER — Encounter (HOSPITAL_COMMUNITY)
Admission: RE | Disposition: A | Discharge status: Home / Self Care | Payer: Self-pay | Source: Ambulatory Visit | Attending: Cardiovascular Disease

## 2022-05-01 ENCOUNTER — Other Ambulatory Visit: Payer: Self-pay

## 2022-05-01 ENCOUNTER — Telehealth: Payer: Self-pay

## 2022-05-01 ENCOUNTER — Encounter (HOSPITAL_COMMUNITY): Payer: Self-pay | Admitting: Cardiovascular Disease

## 2022-05-01 DIAGNOSIS — I480 Paroxysmal atrial fibrillation: Secondary | ICD-10-CM | POA: Insufficient documentation

## 2022-05-01 DIAGNOSIS — J449 Chronic obstructive pulmonary disease, unspecified: Secondary | ICD-10-CM | POA: Insufficient documentation

## 2022-05-01 DIAGNOSIS — I25118 Atherosclerotic heart disease of native coronary artery with other forms of angina pectoris: Secondary | ICD-10-CM | POA: Insufficient documentation

## 2022-05-01 DIAGNOSIS — I2510 Atherosclerotic heart disease of native coronary artery without angina pectoris: Secondary | ICD-10-CM

## 2022-05-01 DIAGNOSIS — I340 Nonrheumatic mitral (valve) insufficiency: Secondary | ICD-10-CM | POA: Diagnosis present

## 2022-05-01 DIAGNOSIS — Z87891 Personal history of nicotine dependence: Secondary | ICD-10-CM | POA: Insufficient documentation

## 2022-05-01 DIAGNOSIS — I252 Old myocardial infarction: Secondary | ICD-10-CM

## 2022-05-01 DIAGNOSIS — I5022 Chronic systolic (congestive) heart failure: Secondary | ICD-10-CM | POA: Insufficient documentation

## 2022-05-01 DIAGNOSIS — I255 Ischemic cardiomyopathy: Secondary | ICD-10-CM | POA: Insufficient documentation

## 2022-05-01 DIAGNOSIS — I110 Hypertensive heart disease with heart failure: Secondary | ICD-10-CM | POA: Diagnosis not present

## 2022-05-01 DIAGNOSIS — E785 Hyperlipidemia, unspecified: Secondary | ICD-10-CM | POA: Diagnosis not present

## 2022-05-01 DIAGNOSIS — Z7901 Long term (current) use of anticoagulants: Secondary | ICD-10-CM | POA: Insufficient documentation

## 2022-05-01 DIAGNOSIS — I509 Heart failure, unspecified: Secondary | ICD-10-CM

## 2022-05-01 HISTORY — PX: TEE WITHOUT CARDIOVERSION: SHX5443

## 2022-05-01 LAB — ECHO TEE
MV M vel: 5.74 m/s
MV Peak grad: 131.8 mmHg
Radius: 1.2 cm

## 2022-05-01 SURGERY — ECHOCARDIOGRAM, TRANSESOPHAGEAL
Anesthesia: Monitor Anesthesia Care

## 2022-05-01 MED ORDER — PROPOFOL 10 MG/ML IV BOLUS
INTRAVENOUS | Status: DC | PRN
  Administered 2022-05-01 (×2): 10 mg via INTRAVENOUS
  Administered 2022-05-01: 20 mg via INTRAVENOUS
  Administered 2022-05-01: 10 mg via INTRAVENOUS

## 2022-05-01 MED ORDER — PROPOFOL 500 MG/50ML IV EMUL
INTRAVENOUS | Status: DC | PRN
  Administered 2022-05-01: 100 ug/kg/min via INTRAVENOUS

## 2022-05-01 MED ORDER — SODIUM CHLORIDE 0.9 % IV SOLN: INTRAVENOUS | Status: DC

## 2022-05-01 NOTE — Anesthesia Procedure Notes (Signed)
Procedure Name: MAC Date/Time: 05/01/2022 10:20 AM  Performed by: Harden Mo, CRNAPre-anesthesia Checklist: Patient identified, Emergency Drugs available, Suction available and Patient being monitored Patient Re-evaluated:Patient Re-evaluated prior to induction Oxygen Delivery Method: Nasal cannula Preoxygenation: Pre-oxygenation with 100% oxygen Induction Type: IV induction Placement Confirmation: positive ETCO2 and breath sounds checked- equal and bilateral Dental Injury: Teeth and Oropharynx as per pre-operative assessment

## 2022-05-01 NOTE — Interval H&P Note (Signed)
History and Physical Interval Note:  05/01/2022 10:05 AM  Penny Mccullough  has presented today for surgery, with the diagnosis of severe mitral regurgitation.  The various methods of treatment have been discussed with the patient and family. After consideration of risks, benefits and other options for treatment, the patient has consented to  Procedure(s): TRANSESOPHAGEAL ECHOCARDIOGRAM (TEE) (N/A) as a surgical intervention.  The patient's history has been reviewed, patient examined, no change in status, stable for surgery.  I have reviewed the patient's chart and labs.  Questions were answered to the patient's satisfaction.     Jenkins Rouge

## 2022-05-01 NOTE — CV Procedure (Signed)
TEE: Anesthesia: propofol  [place] 3A severe MR Retracted posterior leaflet with atrial enlargement Severe LAE Mild TR No ASD/PFO Normal EF 60% AV sclerosis  No LAA thrombus  Valve is suitable for attempted clip procedure See full report in [place]  Jenkins Rouge MD [facility]

## 2022-05-01 NOTE — Progress Notes (Signed)
  Echocardiogram Echocardiogram Transesophageal has been performed.  [name] 05/01/2022, 10:48 AM

## 2022-05-01 NOTE — Transfer of Care (Signed)
Immediate Anesthesia Transfer of Care Note  Patient: Penny Mccullough  Procedure(s) Performed: TRANSESOPHAGEAL ECHOCARDIOGRAM (TEE)  Patient Location: Endoscopy Unit  Anesthesia Type:MAC  Level of Consciousness: awake, alert , and oriented  Airway & Oxygen Therapy: Patient Spontanous Breathing  Post-op Assessment: Report given to RN, Post -op Vital signs reviewed and stable, and Patient moving all extremities X 4  Post vital signs: Reviewed and stable  Last Vitals:  Vitals Value Taken Time  BP 110/74   Temp    Pulse 47 05/01/22 1050  Resp 20 05/01/22 1050  SpO2 93 % 05/01/22 1050  Vitals shown include unvalidated device data.  Last Pain:  Vitals:   05/01/22 0950  TempSrc: Temporal  PainSc: 0-No pain         Complications: No notable events documented.

## 2022-05-01 NOTE — Discharge Instructions (Signed)

## 2022-05-02 ENCOUNTER — Telehealth: Payer: Self-pay | Admitting: Internal Medicine

## 2022-05-02 NOTE — Telephone Encounter (Signed)
Informed the patient that TEE confirmed severe leaking of the mitral valve. Reiterated to her that she will need evaluation by the Heart Failure Team prior to mTEER. Informed her they tried to schedule but she had not returned call. Offered to have the scheduler call today to arrange appointment, but she declined. Offered to have them call tomorrow, but she declined.  Gave her the CHF phone number to call and arrange consult. She agreed she would call.

## 2022-05-02 NOTE — Telephone Encounter (Signed)
Patient is requesting to discuss 3/12 TEE.

## 2022-05-02 NOTE — Anesthesia Postprocedure Evaluation (Signed)
Anesthesia Post Note  Patient: Penny Mccullough  Procedure(s) Performed: TRANSESOPHAGEAL ECHOCARDIOGRAM (TEE)     Patient location during evaluation: PACU Anesthesia Type: MAC Level of consciousness: awake and alert Pain management: pain level controlled Vital Signs Assessment: post-procedure vital signs reviewed and stable Respiratory status: spontaneous breathing, nonlabored ventilation, respiratory function stable and patient connected to nasal cannula oxygen Cardiovascular status: stable and blood pressure returned to baseline Postop Assessment: no apparent nausea or vomiting Anesthetic complications: no  No notable events documented.  Last Vitals:  Vitals:   05/01/22 1110 05/01/22 1121  BP: 112/65 (!) 133/59  Pulse: (!) 51 (!) 48  Resp: 18 17  Temp:    SpO2: 97% 100%    Last Pain:  Vitals:   05/01/22 1121  TempSrc:   PainSc: 0-No pain                 Jozlyn Schatz L Khalise Billard

## 2022-05-03 ENCOUNTER — Telehealth: Payer: Self-pay

## 2022-05-03 NOTE — Telephone Encounter (Signed)
The patient has scheduled an appointment with Dr. Aundra Dubin 4/29.

## 2022-05-03 NOTE — Telephone Encounter (Signed)
Per [employer] review, "For patient PB: This is Secondary MR and does not require a surgical consult  This valve looks suitable for a MitraClip implant. In the SAXB view, the fossa looks reasonable for a transseptal puncture. The LA dimensions are large enough for steering and straddle of the Clip Delivery System. The MR jet is broad based and centrally located. The posterior leaflet measures about 12 mm in the 0 degree view. Gradient measures 3 mmHg (51 HR). MVA measurement was not provided. Based on this information, I'd recommend an NTW and assess for gradient. "

## 2022-05-04 ENCOUNTER — Encounter (HOSPITAL_COMMUNITY): Payer: Self-pay | Admitting: Cardiovascular Disease

## 2022-05-30 ENCOUNTER — Other Ambulatory Visit: Payer: Self-pay | Admitting: Cardiovascular Disease

## 2022-06-05 ENCOUNTER — Other Ambulatory Visit: Payer: Self-pay | Admitting: Family

## 2022-06-16 ENCOUNTER — Other Ambulatory Visit: Payer: Self-pay | Admitting: Cardiovascular Disease

## 2022-06-16 DIAGNOSIS — I480 Paroxysmal atrial fibrillation: Secondary | ICD-10-CM

## 2022-06-18 ENCOUNTER — Ambulatory Visit (HOSPITAL_COMMUNITY)
Admission: RE | Admit: 2022-06-18 | Discharge: 2022-06-18 | Disposition: A | Discharge status: Home / Self Care | Payer: 59 | Source: Ambulatory Visit | Attending: Cardiology | Admitting: Cardiology

## 2022-06-18 ENCOUNTER — Encounter (HOSPITAL_COMMUNITY): Payer: Self-pay | Admitting: Cardiology

## 2022-06-18 VITALS — BP 130/70 | HR 50 | Wt 129.4 lb

## 2022-06-18 DIAGNOSIS — Z7902 Long term (current) use of antithrombotics/antiplatelets: Secondary | ICD-10-CM | POA: Diagnosis not present

## 2022-06-18 DIAGNOSIS — I340 Nonrheumatic mitral (valve) insufficiency: Secondary | ICD-10-CM | POA: Diagnosis not present

## 2022-06-18 DIAGNOSIS — I2510 Atherosclerotic heart disease of native coronary artery without angina pectoris: Secondary | ICD-10-CM | POA: Insufficient documentation

## 2022-06-18 DIAGNOSIS — I480 Paroxysmal atrial fibrillation: Secondary | ICD-10-CM | POA: Insufficient documentation

## 2022-06-18 DIAGNOSIS — J449 Chronic obstructive pulmonary disease, unspecified: Secondary | ICD-10-CM | POA: Insufficient documentation

## 2022-06-18 DIAGNOSIS — Z8249 Family history of ischemic heart disease and other diseases of the circulatory system: Secondary | ICD-10-CM | POA: Insufficient documentation

## 2022-06-18 DIAGNOSIS — Z955 Presence of coronary angioplasty implant and graft: Secondary | ICD-10-CM | POA: Diagnosis not present

## 2022-06-18 DIAGNOSIS — I252 Old myocardial infarction: Secondary | ICD-10-CM | POA: Insufficient documentation

## 2022-06-18 DIAGNOSIS — Z7901 Long term (current) use of anticoagulants: Secondary | ICD-10-CM | POA: Insufficient documentation

## 2022-06-18 DIAGNOSIS — Z79899 Other long term (current) drug therapy: Secondary | ICD-10-CM | POA: Diagnosis not present

## 2022-06-18 DIAGNOSIS — I5022 Chronic systolic (congestive) heart failure: Secondary | ICD-10-CM | POA: Diagnosis present

## 2022-06-18 DIAGNOSIS — Z951 Presence of aortocoronary bypass graft: Secondary | ICD-10-CM | POA: Insufficient documentation

## 2022-06-18 LAB — BASIC METABOLIC PANEL
Anion gap: 10 (ref 5–15)
BUN: 14 mg/dL (ref 8–23)
CO2: 25 mmol/L (ref 22–32)
Calcium: 9 mg/dL (ref 8.9–10.3)
Chloride: 105 mmol/L (ref 98–111)
Creatinine, Ser: 0.94 mg/dL (ref 0.44–1.00)
GFR, Estimated: >60 mL/min (ref >60)
Glucose, Bld: 98 mg/dL (ref 70–99)
Potassium: 4.1 mmol/L (ref 3.5–5.1)
Sodium: 140 mmol/L (ref 135–145)

## 2022-06-18 LAB — LIPID PANEL
Cholesterol: 156 mg/dL (ref 0–200)
HDL: 47 mg/dL (ref >40)
LDL Cholesterol: 82 mg/dL (ref 0–99)
Total CHOL/HDL Ratio: 3.3 RATIO
Triglycerides: 133 mg/dL (ref <150)
VLDL: 27 mg/dL (ref 0–40)

## 2022-06-18 LAB — BRAIN NATRIURETIC PEPTIDE: B Natriuretic Peptide: 363.7 pg/mL — ABNORMAL HIGH (ref 0.0–100.0)

## 2022-06-18 MED ORDER — SPIRONOLACTONE 25 MG PO TABS: 12.5000 mg | ORAL_TABLET | Freq: Every day | ORAL | 3 refills | Status: DC

## 2022-06-18 NOTE — Patient Instructions (Signed)
STOP Potassium  START Spironolactone 12.5 mg ( 1/2 Tab ) daily  Labs done today, your results will be available in MyChart, we will contact you for abnormal readings.  Repeat blood work in 10 days at [facility]  Your physician recommends that you schedule a follow-up appointment in: 6 weeks in at the [place] office   If you have any questions or concerns before your next appointment please send [place] a message through [place] or call our office at [phone].    TO LEAVE A MESSAGE FOR THE NURSE SELECT OPTION 2, PLEASE LEAVE A MESSAGE INCLUDING: YOUR NAME DATE OF BIRTH CALL BACK NUMBER REASON FOR CALL**this is important as we prioritize the call backs  YOU WILL RECEIVE A CALL BACK THE SAME DAY AS LONG AS YOU CALL BEFORE 4:00 PM  At the [facility], you and your health needs are our priority. As part of our continuing mission to provide you with exceptional heart care, we have created designated Provider Care Teams. These Care Teams include your primary Cardiologist (physician) and Advanced Practice Providers (APPs- Physician Assistants and Nurse Practitioners) who all work together to provide you with the care you need, when you need it.   You may see any of the following providers on your designated Care Team at your next follow up: Dr Arvilla Meres Dr Marca Ancona Dr. Marcos Eke, NP Robbie Lis, [place] [facility] [place], [place] Brynda Peon, NP Karle Plumber, PharmD   Please be sure to bring in all your medications bottles to every appointment.    Thank you for choosing [facility]

## 2022-06-19 ENCOUNTER — Telehealth (HOSPITAL_COMMUNITY): Payer: Self-pay

## 2022-06-19 ENCOUNTER — Other Ambulatory Visit (HOSPITAL_COMMUNITY): Payer: Self-pay

## 2022-06-19 MED ORDER — EZETIMIBE 10 MG PO TABS: 10.0000 mg | ORAL_TABLET | Freq: Every day | ORAL | 3 refills | Status: DC

## 2022-06-19 NOTE — Telephone Encounter (Signed)
Patient aware of lab results. Zetia called into her pharmacy.

## 2022-06-19 NOTE — Progress Notes (Signed)
PCP: Pcp, No Cardiology: Dr. Mariah Milling HF Cardiology: Dr. Shirlee Latch  77 y.o. with history of CAD, COPD, paroxysmal atrial fibrillation, CHF, and severe functional mitral regurgitation was referred by Dr. Lynnette Caffey for HF evaluation prior to mTEER procedure. Patient had CABG with LIMA-LAD in 1989.  NSTEMI in 9/22 with atretic LIMA, PCI with DES to ostial and mid LAD.  She developed exertional dyspnea in 2023, workup included echo in 1/24 showing  EF 45-50%, mild LVH, normal RV, severe MR. TEE in 3/24 showed EF 55-60%, normal RV, severe MR with restricted posterior leaflet. She had right and left heart cath showing prominent v-waves in PCWP tracing and nonobstrutive CAD.  Symptoms thought to be related to severe MR and she has been undergoing workup for mTEER given suspected functional MR (possibly infarct-related).   Patient says that she has been feeling much better since starting Lasix.  She still gets short of breath at times.  No dyspnea walking around house but notices some dyspnea when she gardens.  She avoids stairs. No orthopnea/PND.  No lightheadedness or chest pain.  She used to smoke but quit in 1989 when she had her CABG.   ECG (personally reviewed): NSR at 52, nonspecific T wave flattening  Labs (2/24): K 4.9, creatinine 0.77  PMH: 1. CAD: s/p CABG 1989 with LIMA-LAD.  - NSTEMI (9/22):  Cath showed atretic LIMA, had PCI with DES to ostial and mid LAD.  - LHC (2/24):  60% left PDA, patent LAD stents.  2. Mitral regurgitation: Severe functional MR, may be infarct-related with tethered posterior leaflet.  - TEE (3/24): EF 55-60%, normal RV, severe MR with restricted posterior leaflet.  3. Chronic HF with mid range EF: Echo (1/24) with EF 45-50%, mild LVH, normal RV, severe MR.  - RHC (2/24): mean RA 1, PA 55/15, mean PCWP 15 with v-waves to 33, CI 3.47 - TEE (3/24): EF 55-60%, normal RV, severe MR with restricted posterior leaflet.  4. COPD: Quit smoking 1989. 5. Hyperlipidemia 6. H/o  CVA 7. HTN 8. Atrial fibrillation: Paroxysmal.  DCCV 1/23.   SH: Lives in [place], retired, former smoker quit 1989.  No ETOH.   Family History  Problem Relation Age of Onset   Hypertension Mother    ROS: All systems reviewed and negative except as per HPI.   Current Outpatient Medications  Medication Sig Dispense Refill   acetaminophen (TYLENOL) 500 MG tablet Take 500-1,000 mg by mouth every 6 (six) hours as needed (pain.).     albuterol (VENTOLIN HFA) 108 (90 Base) MCG/ACT inhaler Inhale 2 puffs into the lungs every 6 (six) hours as needed for wheezing or shortness of breath. 8 g 0   amiodarone (PACERONE) 200 MG tablet Take 1 tablet (200 mg total) by mouth daily. Take 1 tablet daily 90 tablet 3   atorvastatin (LIPITOR) 80 MG tablet TAKE 1 TABLET BY MOUTH EVERY DAY 90 tablet 0   clopidogrel (PLAVIX) 75 MG tablet TAKE 1 TABLET BY MOUTH EVERY DAY 90 tablet 0   ELIQUIS 5 MG TABS tablet TAKE 1 TABLET BY MOUTH TWICE A DAY 180 tablet 1   furosemide (LASIX) 20 MG tablet TAKE 2 TABLETS (40 MG TOTAL) BY MOUTH DAILY. 180 tablet 0   JARDIANCE 10 MG TABS tablet TAKE 1 TABLET BY MOUTH EVERY DAY BEFORE BREAKFAST 30 tablet 3   metoprolol succinate (TOPROL-XL) 25 MG 24 hr tablet Take 0.5 tablet (12.5 mg) by mouth once daily 45 tablet 3   nitroGLYCERIN (NITROSTAT) 0.4 MG SL tablet  Place 1 tablet (0.4 mg total) under the tongue every 5 (five) minutes as needed for chest pain. 25 tablet 2   spironolactone (ALDACTONE) 25 MG tablet Take 0.5 tablets (12.5 mg total) by mouth daily. 45 tablet 3   No current facility-administered medications for this encounter.   BP 130/70   Pulse (!) 50   Wt 58.7 kg (129 lb 6.4 oz)   SpO2 96%   BMI 24.45 kg/m  General: NAD Neck: No JVD, no thyromegaly or thyroid nodule.  Lungs: Mildly distant BS CV: Nondisplaced PMI.  Heart regular S1/S2, no S3/S4, 2/6 HSM apex.  No peripheral edema.  No carotid bruit.  Normal pedal pulses.  Abdomen: Soft, nontender, no  hepatosplenomegaly, no distention.  Skin: Intact without lesions or rashes.  Neurologic: Alert and oriented x 3.  Psych: Normal affect. Extremities: No clubbing or cyanosis.  HEENT: Normal.   Assessment/Plan:  1. Mitral regurgitation: I reviewed her TEE, she has restriction of the posterior leaflet with severe MR.  This appears to be functional MR, possibly infarct-related.  She had prominent v-waves on PCWP tracing with RHC.  I think she would be a reasonable candidate for mTEER, HF appears compensated on her current regimen.  2. Chronic HF with mid-range EF: Echo in 1/24 with EF 45-50%, mild LVH, normal RV, severe MR. HF may be driven primarily by her severe MR.  Currently NYHA class II without signs of significant volume overloaded on exam.  - Continue Lasix 40 mg daily, BMET/BNP today.  - Increase spironolactone to 25 mg daily and stop supplemental KCl.  Will get BMET in 10 days.  - Continue Jardiance - Continue Toprol XL 12.5 daily.  - Would likely benefit from mTEER as above.  3. CAD: Cath in 2/24 with atretic LIMA, patent LAD stents, 60 LPDA.  No chest pain.  - Continue Plavix 75 mg daily, plan had been to continue this along with Eliquis as long as she has no excessive bleeding.  - Continue atorvastatin 80 daily, sending lipids today.  4. Atrial fibrillation: Paroxysmal.  Had DCCV in 1/23.  - Continue Eliquis.  - Continue Toprol XL 12.5 daily.  She can followup in 6 wks with me in the [place] office.   Marca Ancona 06/19/2022

## 2022-06-19 NOTE — Telephone Encounter (Signed)
-----   Message from Laurey Morale, MD sent at 06/19/2022 10:56 AM EDT ----- Given CAD, LDL is too high.  Add Zetia 10 mg daily.  Lipids/LFTs in 2 months.

## 2022-07-02 NOTE — Progress Notes (Unsigned)
Patient ID: Penny Mccullough MRN: [id number] DOB/AGE: 76-Oct-1947 76 y.o.  Primary Care Physician:Pcp, No Primary Cardiologist: [name]   FOCUSED CARDIOVASCULAR PROBLEM LIST:   1.  Coronary artery disease status post LIMA to LAD 1989; coronary angiography 2022 demonstrated the LIMA graft to be atretic and the patient received proximal and mid LAD stents 2.  COPD 3.  Hyperlipidemia 4.  Hypertension 5.  Atrial fibrillation on Eliquis 6.  Severe mitral regurgitation    HISTORY OF PRESENT ILLNESS:  February 2024: The patient is a 77 y.o. female with the indicated medical history here for recommendations regarding her severe mitral regurgitation.  The patient is here with her daughter.  She tells me that she has noticed increasing fatigue over the last several months.  She works as a Land once a week and notices that she cannot do as much without taking breaks.  She denies any significant shortness of breath on a regular basis but on occasion does wheeze.  She denies any exertional angina.  She denies any peripheral edema or paroxysmal nocturnal dyspnea.  She does suffer from some nuisance bruising at times.  She denies any significant or severe bleeding however.  She has not required any emergency room visits or hospitalizations.  She definitely feels like she cannot do as much versus last year.  In terms of her dental health she has both upper and lower dentures.  Plan: TEE, right heart catheterization, and coronary angiography.  Today: In the interim we reviewed her TEE in the multidisciplinary fashion.  It was thought that her valve disease looked to be more postinflammatory or rheumatic in nature.  Past Medical History:  Diagnosis Date   CAD (coronary artery disease)    CHF (congestive heart failure) (HCC)    COPD (chronic obstructive pulmonary disease) (HCC)    CVA (cerebral vascular accident) (HCC)    HLD (hyperlipidemia)    Hypertension    MI (myocardial  infarction) (HCC) 10/2020    Past Surgical History:  Procedure Laterality Date   CARDIOVERSION N/A 03/16/2021   Procedure: CARDIOVERSION;  Surgeon: Antonieta Iba, MD;  Location: [facility] ORS;  Service: Cardiovascular;  Laterality: N/A;   CORONARY ARTERY BYPASS GRAFT  1989   CORONARY STENT INTERVENTION N/A 10/28/2020   Procedure: CORONARY STENT INTERVENTION;  Surgeon: Alwyn Pea, MD;  Location: [facility] INVASIVE CV LAB;  Service: Cardiovascular;  Laterality: N/A;   LEFT HEART CATH AND CORONARY ANGIOGRAPHY N/A 10/28/2020   Procedure: LEFT HEART CATH AND CORONARY ANGIOGRAPHY;  Surgeon: Alwyn Pea, MD;  Location: [facility] INVASIVE CV LAB;  Service: Cardiovascular;  Laterality: N/A;   RIGHT/LEFT HEART CATH AND CORONARY ANGIOGRAPHY N/A 04/18/2022   Procedure: RIGHT/LEFT HEART CATH AND CORONARY ANGIOGRAPHY;  Surgeon: Orbie Pyo, MD;  Location: [facility] INVASIVE CV LAB;  Service: Cardiovascular;  Laterality: N/A;   TEE WITHOUT CARDIOVERSION N/A 05/01/2022   Procedure: TRANSESOPHAGEAL ECHOCARDIOGRAM (TEE);  Surgeon: Wendall Stade, MD;  Location: [facility] ENDOSCOPY;  Service: Cardiovascular;  Laterality: N/A;    Family History  Problem Relation Age of Onset   Hypertension Mother     Social History   Socioeconomic History   Marital status: Single    Spouse name: Not on file   Number of children: Not on file   Years of education: Not on file   Highest education level: Not on file  Occupational History   Not on file  Tobacco Use   Smoking status: Former    Packs/day: 1.00  Years: 30.00    Additional pack years: 0.00    Total pack years: 30.00    Types: Cigarettes    Quit date: 02/21/1987    Years since quitting: 35.3   Smokeless tobacco: Never  Vaping Use   Vaping Use: Never used  Substance and Sexual Activity   Alcohol use: Not Currently   Drug use: Never   Sexual activity: Not Currently  Other Topics Concern   Not on file  Social History Narrative   Not on file   Social  Determinants of Health   Financial Resource Strain: Not on file  Food Insecurity: Not on file  Transportation Needs: Not on file  Physical Activity: Not on file  Stress: Not on file  Social Connections: Not on file  Intimate Partner Violence: Not on file     Prior to Admission medications   Medication Sig Start Date End Date Taking? Authorizing Provider  albuterol (VENTOLIN HFA) 108 (90 Base) MCG/ACT inhaler Inhale 2 puffs into the lungs every 6 (six) hours as needed for wheezing or shortness of breath. 01/09/21   Antonieta Iba, MD  amiodarone (PACERONE) 200 MG tablet Take 1 tablet (200 mg total) by mouth daily. Take 1 tablet daily 01/23/22   Antonieta Iba, MD  apixaban (ELIQUIS) 5 MG TABS tablet TAKE 1 TABLET BY MOUTH TWICE A DAY 11/13/21   Antonieta Iba, MD  atorvastatin (LIPITOR) 80 MG tablet TAKE 1 TABLET BY MOUTH EVERY DAY 01/15/22   Antonieta Iba, MD  clopidogrel (PLAVIX) 75 MG tablet TAKE 1 TABLET BY MOUTH EVERY DAY 01/29/22   Antonieta Iba, MD  empagliflozin (JARDIANCE) 10 MG TABS tablet TAKE 1 TABLET BY MOUTH EVERY DAY BEFORE BREAKFAST 02/05/22   Antonieta Iba, MD  furosemide (LASIX) 20 MG tablet TAKE 2 TABLETS (40 MG TOTAL) BY MOUTH DAILY. 01/29/22   Antonieta Iba, MD  metoprolol succinate (TOPROL-XL) 25 MG 24 hr tablet Take 0.5 tablet (12.5 mg) by mouth once daily 01/23/22   Furth, Cadence H, PA-C  nitroGLYCERIN (NITROSTAT) 0.4 MG SL tablet Place 1 tablet (0.4 mg total) under the tongue every 5 (five) minutes as needed for chest pain. 12/09/20   Furth, Cadence H, PA-C  potassium chloride (KLOR-CON) 10 MEQ tablet Take 2 tablets (20 mEq total) by mouth daily. 03/21/21   Delma Freeze, FNP    Allergies  Allergen Reactions   Codeine Nausea And Vomiting    REVIEW OF SYSTEMS:  General: no fevers/chills/night sweats Eyes: no blurry vision, diplopia, or amaurosis ENT: no sore throat or hearing loss Resp: no cough, wheezing, or hemoptysis CV: no edema or  palpitations GI: no abdominal pain, nausea, vomiting, diarrhea, or constipation GU: no dysuria, frequency, or hematuria Skin: no rash Neuro: no headache, numbness, tingling, or weakness of extremities Musculoskeletal: no joint pain or swelling Heme: no bleeding, DVT, or easy bruising Endo: no polydipsia or polyuria  There were no vitals taken for this visit.  PHYSICAL EXAM: GEN:  AO x 3 in no acute distress HEENT: normal Dentition: Dentures Neck: JVP normal. +2 carotid upstrokes without bruits. No thyromegaly. Lungs: equal expansion, clear bilaterally CV: Apex is discrete and nondisplaced, RRR with 3 out of 6 holosystolic murmur Abd: soft, non-tender, non-distended; no bruit; positive bowel sounds Ext: no edema, ecchymoses, or cyanosis Vascular: 2+ femoral pulses, 2+ radial pulses       Skin: warm and dry without rash Neuro: CN II-XII grossly intact; motor and sensory grossly intact  DATA AND STUDIES:  EKG: EKG done today that I personally reviewed demonstrates sinus bradycardia Q-wave in V1  2D ECHO: January 2024 1. Left ventricular ejection fraction, by estimation, is 45 to 50%. The  left ventricle has mildly decreased function. The left ventricle  demonstrates global hypokinesis. There is mild left ventricular  hypertrophy. Left ventricular diastolic parameters  are consistent with Grade II diastolic dysfunction (pseudonormalization).   2. Right ventricular systolic function is normal. The right ventricular  size is normal.   3. Left atrial size was severely dilated.   4. The mitral valve is normal in structure. Severe mitral valve  regurgitation.   5. The aortic valve is tricuspid. Aortic valve regurgitation is not  visualized. Aortic valve sclerosis is present, with no evidence of aortic  valve stenosis.   6. The inferior vena cava is normal in size with greater than 50%  respiratory variability, suggesting right atrial pressure of 3 mmHg.   TEE: March 2024  1.  Left ventricular ejection fraction, by estimation, is 55 to 60%. The  left ventricle has normal function.   2. Right ventricular systolic function is normal. The right ventricular  size is normal.   3. Left atrial size was severely dilated. No left atrial/left atrial  appendage thrombus was detected.   4. Right atrial size was mildly dilated.   5. 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 On 3D imaging just on lateral side of A2/P2  Restricted posterior leaflet Some calcium in sub chordal apparatus but  overall valve is suitable for clip with possible use of Paschal device MvA  5.8 cm2, mean gradient at HR 53 bpm   is only 3 mmHg with peak of 10 mmHg Posterior leaflet length 17 mm . The  mitral valve is abnormal. Severe mitral valve regurgitation.   6. The aortic valve is tricuspid. There is mild calcification of the  aortic valve. There is mild thickening of the aortic valve. Aortic valve  regurgitation is not visualized. Aortic valve sclerosis is present, with  no evidence of aortic valve stenosis.   CARDIAC CATH:  February 2024 1.  Patent proximal and mid LAD stents with mild disease elsewhere. 2.  LVEDP of 12 mmHg. 3.  Mean RA pressure of 1 mmHg, RV pressure 50/-2 with RV end-diastolic pressure of 4 mmHg, mean wedge pressure of 15 mmHg with V waves to 33 mmHg, and PA pressure 55/15 with a mean of 27 mmHg.  The cardiac output was 5.38 L/min and the cardiac index was 3.47 L/min/m.  STS RISK CALCULATOR:   Procedure Type: Isolated MVR PERIOPERATIVE OUTCOME ESTIMATE % Operative Mortality 4.66% Morbidity & Mortality 19.4% Stroke 2.06% Renal Failure 2.29% Reoperation 3.63% Prolonged Ventilation 9.12% Deep Sternal Wound Infection 0.046% Long Hospital Stay (>14 days) 10.1% Short Hospital Stay (<6 days)* 20.9%   Procedure Type: Isolated MVr PERIOPERATIVE OUTCOME ESTIMATE % Operative Mortality 1.61% Morbidity & Mortality 12.3% Stroke 2.12% Renal Failure 1.06% Reoperation 2.49% Prolonged  Ventilation 5.93% Deep Sternal Wound Infection 0.039% Long Hospital Stay (>14 days) 6.09% Short Hospital Stay (<6 days)* 34.6%  NHYA CLASS: 2  ASSESSMENT AND PLAN:   Severe mitral valve regurgitation  Paroxysmal atrial fibrillation (HCC)  Chronic systolic heart failure (HCC)  Ischemic cardiomyopathy  Coronary artery disease of native artery of native heart with stable angina pectoris (HCC)  The patient has developed severe symptomatic mitral regurgitation.  I believe she has developed a tethered posterior leaflet due to her LAD infarction sustained a few years ago leading to eccentric and  what looks like severe mitral regurgitation.  I had a long conversation with the patient and her daughter about treatment options.  She is in normal sinus rhythm today so I do not think atrial fibrillation is influencing her symptoms currently.  I will refer the patient for a TEE to better characterize her mitral regurgitation, coronary angiography with right heart catheterization, and advanced heart failure opinion.  Depending on these pieces of data we will conceive a treatment plan but I think transcatheter mitral edge-to-edge repair will likely be feasible.  She has both upper and lower dentures so a dental evaluation is not necessary.   I have personally reviewed the patients imaging data as summarized above.  I have reviewed the natural history of mitral regurgitation with the patient and family members who are present today. We have discussed the limitations of medical therapy and the poor prognosis associated with symptomatic mitral regurgitation. We have also reviewed potential treatment options, including palliative medical therapy, conventional mitral surgery, and transcatheter mitral edge-to-edge repair. We discussed treatment options in the context of this patient's specific comorbid medical conditions.   All of the patient's questions were answered today. Will make further recommendations  based on the results of studies outlined above.   Total time spent with patient today 60 minutes. This includes reviewing records, evaluating the patient and coordinating care.   ADDENDUM 06/27/22: We reviewed the TEE in our imaging conference.  The consensus was that the patient's mitral valve looked to be postinflammatory if not rheumatic in nature.  She does have a history of sternotomy.  This may make surgical treatment of her mitral valvular disease normal challenge.  Will discuss with patient and consider surgical referral however given her history of sternotomy and COPD she may not be an ideal operative candidate.  We would then need to discuss whether edge-to-edge repair would be feasible in the should be pursued.  Orbie Pyo, MD  07/02/2022 12:43 PM    [facility] [address] [place], [place], [place]  [address] Phone: [phone]; Fax: [phone]

## 2022-07-02 NOTE — Progress Notes (Unsigned)
Cardiology Office Note  Date:  07/03/2022   ID:  Penny Mccullough, DOB 1945/06/28, MRN [id number]  PCP:  Pcp, No   Chief Complaint  Patient presents with   3 month follow up     "Doing well." Medications reviewed by the patient verbally.     HPI:  Penny Mccullough is a 77 y.o. female with a hx of  CAD  remote CABG approximately 30 years ago (suspected to be a LIMA-LAD per history),  CVA,  HLD,  COPD /tobacco /chronic bronchitis Ejection fraction 45 to 50% Moderate MR, severely dilated left atrium NSTEMI s/p PCI to ostial and mid LAD 10/28/20  Hard of hearing Atrial fibrillation in December 2022 cardioversion Mar 16, 2021, normal sinus rhythm restored Who presents for follow-up of her coronary disease,  PCI to ostial and mid LAD, atrial fibrillation (diagnosed December 2022)  Last seen in clinic by myself August 2023 Seen by one of our providers February 2024 for severe MR Followed by advanced heart failure clinic June 18, 2022 At that time felt much better after starting Lasix 40 daily Felt to be possible candidate for mitral valve clip  Echo January 2024, EF 45 to 50%, severe MR  In follow-up today denies significant shortness of breath unless she is walking quickly Feels tired, not sleeping well Does not feel rested in the Am  No arrhythmia sx On amio 200 daily and metoprolol 12.5 mg daily Recently started on spironolactone 12.5 daily  EKG personally reviewed by myself on todays visit NSR rate 60 bpm no significant ST-T wave changes  History reviewed Last seen by myself in clinic February 06, 2021 Noted to be in atrial fibrillation at that time, rate 119 bpm, asymptomatic Entresto was held, started on low-dose digoxin, metoprolol succinate increased, Entresto held, Lasix 40 continued  September 2022 NSTEMI s/p PCI to ostial and mid LAD 10/28/20.   Echo showed EF 45-50% with HK of basal to mid inferior and inferolateral wall.   remote CABG x 1, at which time she was  told "they took a main artery from my chest," and appears to be a LIMA to LAD.     PMH:   has a past medical history of CAD (coronary artery disease), CHF (congestive heart failure) (HCC), COPD (chronic obstructive pulmonary disease) (HCC), CVA (cerebral vascular accident) (HCC), HLD (hyperlipidemia), Hypertension, and MI (myocardial infarction) (HCC) (10/2020).  PSH:    Past Surgical History:  Procedure Laterality Date   CARDIOVERSION N/A 03/16/2021   Procedure: CARDIOVERSION;  Surgeon: Antonieta Iba, MD;  Location: [facility] ORS;  Service: Cardiovascular;  Laterality: N/A;   CORONARY ARTERY BYPASS GRAFT  1989   CORONARY STENT INTERVENTION N/A 10/28/2020   Procedure: CORONARY STENT INTERVENTION;  Surgeon: Alwyn Pea, MD;  Location: [facility] INVASIVE CV LAB;  Service: Cardiovascular;  Laterality: N/A;   LEFT HEART CATH AND CORONARY ANGIOGRAPHY N/A 10/28/2020   Procedure: LEFT HEART CATH AND CORONARY ANGIOGRAPHY;  Surgeon: Alwyn Pea, MD;  Location: [facility] INVASIVE CV LAB;  Service: Cardiovascular;  Laterality: N/A;   RIGHT/LEFT HEART CATH AND CORONARY ANGIOGRAPHY N/A 04/18/2022   Procedure: RIGHT/LEFT HEART CATH AND CORONARY ANGIOGRAPHY;  Surgeon: Orbie Pyo, MD;  Location: [facility] INVASIVE CV LAB;  Service: Cardiovascular;  Laterality: N/A;   TEE WITHOUT CARDIOVERSION N/A 05/01/2022   Procedure: TRANSESOPHAGEAL ECHOCARDIOGRAM (TEE);  Surgeon: Wendall Stade, MD;  Location: [facility] ENDOSCOPY;  Service: Cardiovascular;  Laterality: N/A;    Current Outpatient Medications  Medication Sig Dispense Refill  albuterol (VENTOLIN HFA) 108 (90 Base) MCG/ACT inhaler Inhale 2 puffs into the lungs every 6 (six) hours as needed for wheezing or shortness of breath. 8 g 0   nitroGLYCERIN (NITROSTAT) 0.4 MG SL tablet Place 1 tablet (0.4 mg total) under the tongue every 5 (five) minutes as needed for chest pain. 25 tablet 2   acetaminophen (TYLENOL) 500 MG tablet Take 500-1,000 mg by mouth every 6 (six)  hours as needed (pain.). (Patient not taking: Reported on 07/03/2022)     amiodarone (PACERONE) 200 MG tablet Take 1 tablet (200 mg total) by mouth daily. Take 1 tablet daily 90 tablet 3   apixaban (ELIQUIS) 5 MG TABS tablet Take 1 tablet (5 mg total) by mouth 2 (two) times daily. 180 tablet 3   atorvastatin (LIPITOR) 80 MG tablet Take 1 tablet (80 mg total) by mouth daily. 90 tablet 3   clopidogrel (PLAVIX) 75 MG tablet Take 1 tablet (75 mg total) by mouth daily. 90 tablet 3   empagliflozin (JARDIANCE) 10 MG TABS tablet TAKE 1 TABLET BY MOUTH EVERY DAY BEFORE BREAKFAST 30 tablet 11   ezetimibe (ZETIA) 10 MG tablet Take 1 tablet (10 mg total) by mouth daily. 90 tablet 3   furosemide (LASIX) 20 MG tablet Take 2 tablets (40 mg total) by mouth daily. 180 tablet 3   metoprolol succinate (TOPROL-XL) 25 MG 24 hr tablet Take 0.5 tablet (12.5 mg) by mouth once daily 45 tablet 3   spironolactone (ALDACTONE) 25 MG tablet Take 0.5 tablets (12.5 mg total) by mouth daily. 45 tablet 3   No current facility-administered medications for this visit.    Allergies:   Codeine   Social History:  The patient  reports that she quit smoking about 35 years ago. Her smoking use included cigarettes. She has a 30.00 pack-year smoking history. She has never used smokeless tobacco. She reports that she does not currently use alcohol. She reports that she does not use drugs.   Family History:   family history includes Hypertension in her mother.    Review of Systems: Review of Systems  Constitutional: Negative.   HENT: Negative.    Respiratory: Negative.    Cardiovascular: Negative.   Gastrointestinal: Negative.   Musculoskeletal: Negative.   Neurological: Negative.   Psychiatric/Behavioral: Negative.    All other systems reviewed and are negative.  2-minute PHYSICAL EXAM: VS:  BP (!) 140/72 (BP Location: Left Arm, Patient Position: Sitting, Cuff Size: Normal)   Pulse 60   Ht 5\' 1"  (1.549 m)   Wt 124 lb 8 oz  (56.5 kg)   SpO2 98%   BMI 23.52 kg/m  , BMI Body mass index is 23.52 kg/m. Constitutional:  oriented to person, place, and time. No distress.  HENT:  Head: Grossly normal Eyes:  no discharge. No scleral icterus.  Neck: No JVD, no carotid bruits  Cardiovascular: Irregularly irregular,  no murmurs appreciated Pulmonary/Chest: Clear to auscultation bilaterally, no wheezes or rails Abdominal: Soft.  no distension.  no tenderness.  Musculoskeletal: Normal range of motion Neurological:  normal muscle tone. Coordination normal. No atrophy Skin: Skin warm and dry Psychiatric: normal affect, pleasant   Recent Labs: 01/02/2022: Magnesium 2.2; TSH 1.400 04/11/2022: Platelets 315 04/18/2022: Hemoglobin 12.2 06/18/2022: B Natriuretic Peptide 363.7; BUN 14; Creatinine, Ser 0.94; Potassium 4.1; Sodium 140    Lipid Panel Lab Results  Component Value Date   CHOL 156 06/18/2022   HDL 47 06/18/2022   LDLCALC 82 06/18/2022   TRIG 133 06/18/2022  Wt Readings from Last 3 Encounters:  07/03/22 124 lb 8 oz (56.5 kg)  06/18/22 129 lb 6.4 oz (58.7 kg)  05/01/22 125 lb (56.7 kg)     ASSESSMENT AND PLAN:  Problem List Items Addressed This Visit       Cardiology Problems   CAD (coronary artery disease)   Relevant Medications   spironolactone (ALDACTONE) 25 MG tablet   metoprolol succinate (TOPROL-XL) 25 MG 24 hr tablet   furosemide (LASIX) 20 MG tablet   ezetimibe (ZETIA) 10 MG tablet   apixaban (ELIQUIS) 5 MG TABS tablet   atorvastatin (LIPITOR) 80 MG tablet   amiodarone (PACERONE) 200 MG tablet   Other Relevant Orders   EKG 12-Lead     Other   COPD (chronic obstructive pulmonary disease) (HCC)   Relevant Orders   EKG 12-Lead   Other Visit Diagnoses     Chronic systolic heart failure (HCC)    -  Primary   Relevant Medications   spironolactone (ALDACTONE) 25 MG tablet   metoprolol succinate (TOPROL-XL) 25 MG 24 hr tablet   furosemide (LASIX) 20 MG tablet   ezetimibe  (ZETIA) 10 MG tablet   apixaban (ELIQUIS) 5 MG TABS tablet   atorvastatin (LIPITOR) 80 MG tablet   amiodarone (PACERONE) 200 MG tablet   Other Relevant Orders   EKG 12-Lead   Severe mitral valve regurgitation       Relevant Medications   spironolactone (ALDACTONE) 25 MG tablet   metoprolol succinate (TOPROL-XL) 25 MG 24 hr tablet   furosemide (LASIX) 20 MG tablet   ezetimibe (ZETIA) 10 MG tablet   apixaban (ELIQUIS) 5 MG TABS tablet   atorvastatin (LIPITOR) 80 MG tablet   amiodarone (PACERONE) 200 MG tablet   Other Relevant Orders   EKG 12-Lead   Paroxysmal atrial fibrillation (HCC)       Relevant Medications   spironolactone (ALDACTONE) 25 MG tablet   metoprolol succinate (TOPROL-XL) 25 MG 24 hr tablet   furosemide (LASIX) 20 MG tablet   ezetimibe (ZETIA) 10 MG tablet   apixaban (ELIQUIS) 5 MG TABS tablet   clopidogrel (PLAVIX) 75 MG tablet   atorvastatin (LIPITOR) 80 MG tablet   amiodarone (PACERONE) 200 MG tablet   Other Relevant Orders   EKG 12-Lead   Ischemic cardiomyopathy       Relevant Medications   spironolactone (ALDACTONE) 25 MG tablet   metoprolol succinate (TOPROL-XL) 25 MG 24 hr tablet   furosemide (LASIX) 20 MG tablet   ezetimibe (ZETIA) 10 MG tablet   apixaban (ELIQUIS) 5 MG TABS tablet   atorvastatin (LIPITOR) 80 MG tablet   amiodarone (PACERONE) 200 MG tablet   Other Relevant Orders   EKG 12-Lead   Essential hypertension       Relevant Medications   spironolactone (ALDACTONE) 25 MG tablet   metoprolol succinate (TOPROL-XL) 25 MG 24 hr tablet   furosemide (LASIX) 20 MG tablet   ezetimibe (ZETIA) 10 MG tablet   apixaban (ELIQUIS) 5 MG TABS tablet   atorvastatin (LIPITOR) 80 MG tablet   amiodarone (PACERONE) 200 MG tablet   Other Relevant Orders   EKG 12-Lead   Irregular heart rate       Relevant Medications   apixaban (ELIQUIS) 5 MG TABS tablet   Other Relevant Orders   EKG 12-Lead     Coronary disease with stable angina Prior PCI to ostial  and mid LAD On Plavix  on Eliquis for atrial fibrillation On statin, goal LDL less than 70 Zetia  recently added to achieve goal Denies chest pain concerning for angina Nonobstructive disease on recent heart catheterization February 2024  Atrial fibrillation with RVR December 2022 Underwent cardioversion January 2023, recurrent A-fib August 2023 Started on amiodarone November 2023 converting to normal sinus rhythm maintaining normal sinus rhythm on amiodarone, metoprolol Tolerating Eliquis 5 twice daily  Severe mitral valve regurgitation Meeting in [place] to discuss her candidacy for mTEER Appears euvolemic on today's visit Ejection fraction 45 to 50% with severe MR Tolerating low-dose metoprolol succinate, Jardiance, spironolactone, Lasix Maintaining normal sinus rhythm  Lots of questions today, discussion concerning procedure, discussed each of her medications.  Mild hard of hearing  Total encounter time more than 40 minutes  Greater than 50% was spent in counseling and coordination of care with the patient    Signed, Dossie Arbour, M.D., Ph.D. [facility] [place], [place] [phone]

## 2022-07-03 ENCOUNTER — Ambulatory Visit: Payer: 59 | Attending: Cardiovascular Disease | Admitting: Cardiovascular Disease

## 2022-07-03 ENCOUNTER — Encounter: Payer: Self-pay | Admitting: Cardiovascular Disease

## 2022-07-03 VITALS — BP 140/72 | HR 60 | Ht 61.0 in | Wt 124.5 lb

## 2022-07-03 DIAGNOSIS — I480 Paroxysmal atrial fibrillation: Secondary | ICD-10-CM

## 2022-07-03 DIAGNOSIS — I25118 Atherosclerotic heart disease of native coronary artery with other forms of angina pectoris: Secondary | ICD-10-CM

## 2022-07-03 DIAGNOSIS — J449 Chronic obstructive pulmonary disease, unspecified: Secondary | ICD-10-CM

## 2022-07-03 DIAGNOSIS — I340 Nonrheumatic mitral (valve) insufficiency: Secondary | ICD-10-CM

## 2022-07-03 DIAGNOSIS — I255 Ischemic cardiomyopathy: Secondary | ICD-10-CM

## 2022-07-03 DIAGNOSIS — I499 Cardiac arrhythmia, unspecified: Secondary | ICD-10-CM

## 2022-07-03 DIAGNOSIS — I5022 Chronic systolic (congestive) heart failure: Secondary | ICD-10-CM

## 2022-07-03 DIAGNOSIS — I10 Essential (primary) hypertension: Secondary | ICD-10-CM

## 2022-07-03 MED ORDER — ATORVASTATIN CALCIUM 80 MG PO TABS: 80.0000 mg | ORAL_TABLET | Freq: Every day | ORAL | 3 refills | Status: DC

## 2022-07-03 MED ORDER — SPIRONOLACTONE 25 MG PO TABS: 12.5000 mg | ORAL_TABLET | Freq: Every day | ORAL | 3 refills | Status: DC

## 2022-07-03 MED ORDER — FUROSEMIDE 20 MG PO TABS: 40.0000 mg | ORAL_TABLET | Freq: Every day | ORAL | 3 refills | Status: DC

## 2022-07-03 MED ORDER — CLOPIDOGREL BISULFATE 75 MG PO TABS
75.0000 mg | ORAL_TABLET | Freq: Every day | ORAL | 3 refills | Status: DC
Start: 2022-07-03 — End: 2022-07-04

## 2022-07-03 MED ORDER — EMPAGLIFLOZIN 10 MG PO TABS: ORAL_TABLET | ORAL | 11 refills | Status: DC

## 2022-07-03 MED ORDER — METOPROLOL SUCCINATE ER 25 MG PO TB24: ORAL_TABLET | ORAL | 3 refills | Status: DC

## 2022-07-03 MED ORDER — EZETIMIBE 10 MG PO TABS: 10.0000 mg | ORAL_TABLET | Freq: Every day | ORAL | 3 refills | Status: DC

## 2022-07-03 MED ORDER — APIXABAN 5 MG PO TABS
5.0000 mg | ORAL_TABLET | Freq: Two times a day (BID) | ORAL | 3 refills | Status: DC
Start: 2022-07-03 — End: 2023-03-22

## 2022-07-03 MED ORDER — AMIODARONE HCL 200 MG PO TABS: 200.0000 mg | ORAL_TABLET | Freq: Every day | ORAL | 3 refills | Status: DC

## 2022-07-03 NOTE — Patient Instructions (Addendum)
Medication Instructions:  No changes  If you need a refill on your cardiac medications before your next appointment, please call your pharmacy.    Lab work: No new labs needed   Testing/Procedures: No new testing needed   Follow-Up: At [facility], you and your health needs are our priority.  As part of our continuing mission to provide you with exceptional heart care, we have created designated Provider Care Teams.  These Care Teams include your primary Cardiologist (physician) and Advanced Practice Providers (APPs -  Physician Assistants and Nurse Practitioners) who all work together to provide you with the care you need, when you need it.  You will need a follow up appointment in 6 months  Providers on your designated Care Team:   Christopher Berge, NP Ryan Dunn, PA-C Cadence Furth, PA-C  COVID-19 Vaccine Information can be found at: [email or web address] For questions related to vaccine distribution or appointments, please email [email or web address] or call [phone].   

## 2022-07-04 ENCOUNTER — Ambulatory Visit: Payer: 59 | Attending: Internal Medicine | Admitting: Internal Medicine

## 2022-07-04 ENCOUNTER — Encounter: Payer: Self-pay | Admitting: Internal Medicine

## 2022-07-04 VITALS — BP 136/84 | HR 65 | Ht 61.0 in | Wt 124.8 lb

## 2022-07-04 DIAGNOSIS — I255 Ischemic cardiomyopathy: Secondary | ICD-10-CM

## 2022-07-04 DIAGNOSIS — I480 Paroxysmal atrial fibrillation: Secondary | ICD-10-CM

## 2022-07-04 DIAGNOSIS — I5022 Chronic systolic (congestive) heart failure: Secondary | ICD-10-CM | POA: Diagnosis not present

## 2022-07-04 DIAGNOSIS — I340 Nonrheumatic mitral (valve) insufficiency: Secondary | ICD-10-CM

## 2022-07-04 DIAGNOSIS — I25118 Atherosclerotic heart disease of native coronary artery with other forms of angina pectoris: Secondary | ICD-10-CM

## 2022-07-04 NOTE — Patient Instructions (Signed)
Medication Instructions:  Your physician has recommended you make the following change in your medication:  1.) stop clopidogrel (Plavix)  *If you need a refill on your cardiac medications before your next appointment, please call your pharmacy*   Lab Work: none   Testing/Procedures: none   Follow-Up: Per Structural Heart Team  Other Instructions You have been referred to Cardiothoracic Surgery (Dr. Leafy Ro)

## 2022-07-19 ENCOUNTER — Other Ambulatory Visit (HOSPITAL_COMMUNITY): Payer: Self-pay

## 2022-07-19 ENCOUNTER — Ambulatory Visit (HOSPITAL_BASED_OUTPATIENT_CLINIC_OR_DEPARTMENT_OTHER): Payer: 59 | Admitting: Cardiology

## 2022-07-19 ENCOUNTER — Telehealth: Payer: Self-pay

## 2022-07-19 ENCOUNTER — Encounter: Payer: Self-pay | Admitting: Cardiology

## 2022-07-19 ENCOUNTER — Other Ambulatory Visit
Admission: RE | Admit: 2022-07-19 | Discharge: 2022-07-19 | Disposition: A | Discharge status: Home / Self Care | Payer: 59 | Source: Ambulatory Visit | Attending: Cardiology | Admitting: Cardiology

## 2022-07-19 VITALS — BP 138/66 | HR 56 | Wt 128.2 lb

## 2022-07-19 DIAGNOSIS — I5022 Chronic systolic (congestive) heart failure: Secondary | ICD-10-CM | POA: Insufficient documentation

## 2022-07-19 LAB — COMPREHENSIVE METABOLIC PANEL
ALT: 25 U/L (ref 0–44)
AST: 22 U/L (ref 15–41)
Albumin: 4 g/dL (ref 3.5–5.0)
Alkaline Phosphatase: 111 U/L (ref 38–126)
Anion gap: 8 (ref 5–15)
BUN: 20 mg/dL (ref 8–23)
CO2: 28 mmol/L (ref 22–32)
Calcium: 9.5 mg/dL (ref 8.9–10.3)
Chloride: 104 mmol/L (ref 98–111)
Creatinine, Ser: 1.01 mg/dL — ABNORMAL HIGH (ref 0.44–1.00)
GFR, Estimated: 58 mL/min — ABNORMAL LOW (ref >60)
Glucose, Bld: 102 mg/dL — ABNORMAL HIGH (ref 70–99)
Potassium: 5.1 mmol/L (ref 3.5–5.1)
Sodium: 140 mmol/L (ref 135–145)
Total Bilirubin: 1 mg/dL (ref 0.3–1.2)
Total Protein: 7.2 g/dL (ref 6.5–8.1)

## 2022-07-19 LAB — BRAIN NATRIURETIC PEPTIDE: B Natriuretic Peptide: 256.9 pg/mL — ABNORMAL HIGH (ref 0.0–100.0)

## 2022-07-19 LAB — TSH: TSH: 3.144 u[IU]/mL (ref 0.350–4.500)

## 2022-07-19 MED ORDER — ENTRESTO 24-26 MG PO TABS: 1.0000 | ORAL_TABLET | Freq: Two times a day (BID) | ORAL | 7 refills | Status: DC

## 2022-07-19 NOTE — Telephone Encounter (Signed)
-----   Message from Laurey Morale, MD sent at 07/19/2022  1:30 PM EDT ----- Low K diet, stop any K supplement.

## 2022-07-19 NOTE — Patient Instructions (Signed)
Medication Changes:  START Entresto 24/25 mg (1 tablet) twice a day    *If you need a refill on your cardiac medications before your next appointment, please call your pharmacy*  Lab Work:  Labs done today, your results will be available in MyChart, we will contact you for abnormal readings.  Get repeat blood work in 10 days.   Follow-Up in:   Your physician recommends that you schedule a follow-up appointment in: 6 weeks with Dr. Shirlee Latch    Do the following things EVERYDAY: Weigh yourself in the morning before breakfast. Write it down and keep it in a log. Take your medicines as prescribed Eat low salt foods--Limit salt (sodium) to 2000 mg per day.  Stay as active as you can everyday Limit all fluids for the day to less than 2 liters    Need to Contact us:  If you have any questions or concerns before your next appointment please send [place] a message through [place] or call our office at [phone]

## 2022-07-19 NOTE — Progress Notes (Signed)
PCP: Pcp, No Cardiology: Dr. Mariah Milling HF Cardiology: Dr. Shirlee Latch  77 y.o. with history of CAD, COPD, paroxysmal atrial fibrillation, CHF, and severe functional mitral regurgitation was referred by Dr. Lynnette Caffey for HF evaluation prior to mTEER procedure. Patient had CABG with LIMA-LAD in 1989.  NSTEMI in 9/22 with atretic LIMA, PCI with DES to ostial and mid LAD.  She developed exertional dyspnea in 2023, workup included echo in 1/24 showing  EF 45-50%, mild LVH, normal RV, severe MR. TEE in 3/24 showed EF 55-60%, normal RV, severe MR with restricted posterior leaflet. She had right and left heart cath showing prominent v-waves in PCWP tracing and nonobstrutive CAD.  Symptoms thought to be related to severe MR and she has been undergoing workup for mTEER.  There has been concern that the MR is rheumatic/inflammatory, not generally treated with mTEER.  She has been set up to see Dr. Leafy Ro to discuss surgical option.    Patient returns for followup of CHF and MR.  She gets mildly short of breath walking longer distances.  Less endurance than in the past.  No orthopnea/PND.  No chest pain.  No lightheadedness. Weight down 1 lb.   ECG (personally reviewed): NSR at 50  Labs (2/24): K 4.9, creatinine 0.77 Labs (4/24): LDL 82, BNP 364, K 4.1, creatinine 0.94  PMH: 1. CAD: s/p CABG 1989 with LIMA-LAD.  - NSTEMI (9/22):  Cath showed atretic LIMA, had PCI with DES to ostial and mid LAD.  - LHC (2/24):  60% left PDA, patent LAD stents.  2. Mitral regurgitation: Severe functional MR, may be infarct-related with tethered posterior leaflet.  - TEE (3/24): EF 55-60%, normal RV, severe MR with restricted posterior leaflet.  3. Chronic HF with mid range EF: Echo (1/24) with EF 45-50%, mild LVH, normal RV, severe MR.  - RHC (2/24): mean RA 1, PA 55/15, mean PCWP 15 with v-waves to 33, CI 3.47 - TEE (3/24): EF 55-60%, normal RV, severe MR with restricted posterior leaflet.  4. COPD: Quit smoking 1989. 5.  Hyperlipidemia 6. H/o CVA 7. HTN 8. Atrial fibrillation: Paroxysmal.  DCCV 1/23.   SH: Lives in [place], retired, former smoker quit 1989.  No ETOH.   Family History  Problem Relation Age of Onset   Hypertension Mother    ROS: All systems reviewed and negative except as per HPI.   Current Outpatient Medications  Medication Sig Dispense Refill   acetaminophen (TYLENOL) 500 MG tablet Take 500-1,000 mg by mouth every 6 (six) hours as needed (pain.).     albuterol (VENTOLIN HFA) 108 (90 Base) MCG/ACT inhaler Inhale 2 puffs into the lungs every 6 (six) hours as needed for wheezing or shortness of breath. 8 g 0   amiodarone (PACERONE) 200 MG tablet Take 1 tablet (200 mg total) by mouth daily. Take 1 tablet daily 90 tablet 3   apixaban (ELIQUIS) 5 MG TABS tablet Take 1 tablet (5 mg total) by mouth 2 (two) times daily. 180 tablet 3   atorvastatin (LIPITOR) 80 MG tablet Take 1 tablet (80 mg total) by mouth daily. 90 tablet 3   empagliflozin (JARDIANCE) 10 MG TABS tablet TAKE 1 TABLET BY MOUTH EVERY DAY BEFORE BREAKFAST 30 tablet 11   ezetimibe (ZETIA) 10 MG tablet Take 1 tablet (10 mg total) by mouth daily. 90 tablet 3   furosemide (LASIX) 20 MG tablet Take 2 tablets (40 mg total) by mouth daily. 180 tablet 3   metoprolol succinate (TOPROL-XL) 25 MG 24 hr tablet Take  0.5 tablet (12.5 mg) by mouth once daily 45 tablet 3   nitroGLYCERIN (NITROSTAT) 0.4 MG SL tablet Place 1 tablet (0.4 mg total) under the tongue every 5 (five) minutes as needed for chest pain. 25 tablet 2   sacubitril-valsartan (ENTRESTO) 24-26 MG Take 1 tablet by mouth 2 (two) times daily. 90 tablet 7   spironolactone (ALDACTONE) 25 MG tablet Take 0.5 tablets (12.5 mg total) by mouth daily. 45 tablet 3   No current facility-administered medications for this visit.   BP 138/66 (BP Location: Left Arm, Patient Position: Sitting)   Pulse (!) 56   Wt 128 lb 3.2 oz (58.2 kg)   SpO2 98%   BMI 24.22 kg/m  General: NAD Neck: No  JVD, no thyromegaly or thyroid nodule.  Lungs: Clear to auscultation bilaterally with normal respiratory effort. CV: Nondisplaced PMI.  Heart regular S1/S2, no S3/S4, 2/6 HSM apex.  No peripheral edema.  No carotid bruit.  Normal pedal pulses.  Abdomen: Soft, nontender, no hepatosplenomegaly, no distention.  Skin: Intact without lesions or rashes.  Neurologic: Alert and oriented x 3.  Psych: Normal affect. Extremities: No clubbing or cyanosis.  HEENT: Normal.   Assessment/Plan:  1. Mitral regurgitation: I reviewed her TEE, she has restriction of the posterior leaflet with severe MR.  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  Mean MV gradient 3 mmHg.   She had prominent v-waves on PCWP tracing with RHC.  Rheumatic MR is generally not treated with mTEER.  However, she would be high risk for surgical MVR given prior sternotomy and age. Mean gradient across the MV is not significantly elevated.  She will be seeing cardiac surgeon, but if deemed too high risk for surgical MV repair, would again consider mTEER.   2. Chronic HF with mid-range EF: Echo in 1/24 with EF 45-50%, mild LVH, normal RV, severe MR. HF may be driven primarily by her severe MR.  Currently NYHA class II-III without signs of significant volume overloaded on exam.  - Continue Lasix 40 mg daily, BMET/BNP today.  - Continue spironolactone 12.5 mg daily.   - Continue Jardiance - Continue Toprol XL 12.5 daily.  - Add Entresto 24/26 bid for afterload reduction.  BMET/BNP today and BMET in 10 days.  - Would benefit from MV repair.   3. CAD: Cath in 2/24 with atretic LIMA, patent LAD stents, 60 LPDA.  No chest pain.  - No ASA or Plavix for now given stable CAD with apixaban use.   - Continue atorvastatin 80 daily, good lipids in 4/24.  4. Atrial fibrillation: Paroxysmal.  Had DCCV in 1/23. NSR today.  - Continue Eliquis.  - Continue Toprol XL 12.5 daily. - Continue amiodarone 200 mg daily.  Check LFTs/TSH today.  She will need a regular eye  exam.   She can followup in 6 wks with me   Marca Ancona 07/19/2022

## 2022-07-19 NOTE — Telephone Encounter (Signed)
Pt aware, agreeable, and verbalized understanding 

## 2022-07-31 ENCOUNTER — Other Ambulatory Visit
Admission: RE | Admit: 2022-07-31 | Discharge: 2022-07-31 | Disposition: A | Discharge status: Home / Self Care | Payer: 59 | Attending: Cardiology | Admitting: Cardiology

## 2022-07-31 DIAGNOSIS — I5022 Chronic systolic (congestive) heart failure: Secondary | ICD-10-CM | POA: Insufficient documentation

## 2022-07-31 LAB — BASIC METABOLIC PANEL
Anion gap: 12 (ref 5–15)
BUN: 20 mg/dL (ref 8–23)
CO2: 26 mmol/L (ref 22–32)
Calcium: 9.3 mg/dL (ref 8.9–10.3)
Chloride: 101 mmol/L (ref 98–111)
Creatinine, Ser: 1.11 mg/dL — ABNORMAL HIGH (ref 0.44–1.00)
GFR, Estimated: 52 mL/min — ABNORMAL LOW (ref >60)
Glucose, Bld: 106 mg/dL — ABNORMAL HIGH (ref 70–99)
Potassium: 4.4 mmol/L (ref 3.5–5.1)
Sodium: 139 mmol/L (ref 135–145)

## 2022-08-05 NOTE — Progress Notes (Unsigned)
[address]       [place] [address]             [phone]           Penny Mccullough [facility] Medical Record #[id number] Date of Birth: 02-18-1946  Orbie Pyo, MD Pcp, No  Chief Complaint:   Severe MR  History of Present Illness:     Pt is a pleasant 77 yo female who has previous LIMA to LAD 35 yrs ago which was noted to be occluded with MI 2 yrs ago and treated with PCI. Pt has been followed since then with an EF 45-50% then with MR. She reports that she has had less energy and some SOB (as per daughter) since the MI and that it has not gotten worse. 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 She has COPD not on home O2 and she has chronic afib treated with amiodarone, bb and eliquis. She feels she is in NSR most of the time. 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 She had cath with patent stents and moderate PHTN with SPA pressures in mid 50s. It was felt that with the thickened leaflets mitral clip was not advised.       Past Medical History:  Diagnosis Date   CAD (coronary artery disease)    CHF (congestive heart failure) (HCC)    COPD (chronic obstructive pulmonary disease) (HCC)    CVA (cerebral vascular accident) (HCC)    HLD (hyperlipidemia)    Hypertension    MI (myocardial infarction) (HCC) 10/2020    Past Surgical History:  Procedure Laterality Date   CARDIOVERSION N/A 03/16/2021   Procedure: CARDIOVERSION;  Surgeon: Antonieta Iba, MD;  Location: [facility] ORS;  Service: Cardiovascular;  Laterality: N/A;   CORONARY ARTERY BYPASS GRAFT  1989   CORONARY STENT INTERVENTION N/A 10/28/2020   Procedure: CORONARY STENT INTERVENTION;  Surgeon: Alwyn Pea, MD;  Location: [facility] INVASIVE CV LAB;  Service: Cardiovascular;  Laterality: N/A;   LEFT HEART CATH AND CORONARY ANGIOGRAPHY N/A 10/28/2020    Procedure: LEFT HEART CATH AND CORONARY ANGIOGRAPHY;  Surgeon: Alwyn Pea, MD;  Location: [facility] INVASIVE CV LAB;  Service: Cardiovascular;  Laterality: N/A;   RIGHT/LEFT HEART CATH AND CORONARY ANGIOGRAPHY N/A 04/18/2022   Procedure: RIGHT/LEFT HEART CATH AND CORONARY ANGIOGRAPHY;  Surgeon: Orbie Pyo, MD;  Location: [facility] INVASIVE CV LAB;  Service: Cardiovascular;  Laterality: N/A;   TEE WITHOUT CARDIOVERSION N/A 05/01/2022   Procedure: TRANSESOPHAGEAL ECHOCARDIOGRAM (TEE);  Surgeon: Wendall Stade, MD;  Location: [facility] ENDOSCOPY;  Service: Cardiovascular;  Laterality: N/A;    Social History   Tobacco Use  Smoking Status Former   Packs/day: 1.00   Years: 30.00   Additional pack years: 0.00   Total pack years: 30.00   Types: Cigarettes   Quit date: 02/21/1987   Years since quitting: 35.4  Smokeless Tobacco Never    Social History   Substance and Sexual Activity  Alcohol Use Not Currently    Social History   Socioeconomic History   Marital status: Single    Spouse name: Not on file   Number of children: Not on file   Years of education: Not on file   Highest education level: Not on file  Occupational History   Not on file  Tobacco Use  Smoking status: Former    Packs/day: 1.00    Years: 30.00    Additional pack years: 0.00    Total pack years: 30.00    Types: Cigarettes    Quit date: 02/21/1987    Years since quitting: 35.4   Smokeless tobacco: Never  Vaping Use   Vaping Use: Never used  Substance and Sexual Activity   Alcohol use: Not Currently   Drug use: Never   Sexual activity: Not Currently  Other Topics Concern   Not on file  Social History Narrative   Not on file   Social Determinants of Health   Financial Resource Strain: Not on file  Food Insecurity: Not on file  Transportation Needs: Not on file  Physical Activity: Not on file  Stress: Not on file  Social Connections: Not on file  Intimate Partner Violence: Not on file    Allergies   Allergen Reactions   Codeine Nausea And Vomiting    Current Outpatient Medications  Medication Sig Dispense Refill   acetaminophen (TYLENOL) 500 MG tablet Take 500-1,000 mg by mouth every 6 (six) hours as needed (pain.).     albuterol (VENTOLIN HFA) 108 (90 Base) MCG/ACT inhaler Inhale 2 puffs into the lungs every 6 (six) hours as needed for wheezing or shortness of breath. 8 g 0   amiodarone (PACERONE) 200 MG tablet Take 1 tablet (200 mg total) by mouth daily. Take 1 tablet daily 90 tablet 3   apixaban (ELIQUIS) 5 MG TABS tablet Take 1 tablet (5 mg total) by mouth 2 (two) times daily. 180 tablet 3   atorvastatin (LIPITOR) 80 MG tablet Take 1 tablet (80 mg total) by mouth daily. 90 tablet 3   empagliflozin (JARDIANCE) 10 MG TABS tablet TAKE 1 TABLET BY MOUTH EVERY DAY BEFORE BREAKFAST 30 tablet 11   ezetimibe (ZETIA) 10 MG tablet Take 1 tablet (10 mg total) by mouth daily. 90 tablet 3   furosemide (LASIX) 20 MG tablet Take 2 tablets (40 mg total) by mouth daily. 180 tablet 3   metoprolol succinate (TOPROL-XL) 25 MG 24 hr tablet Take 0.5 tablet (12.5 mg) by mouth once daily 45 tablet 3   nitroGLYCERIN (NITROSTAT) 0.4 MG SL tablet Place 1 tablet (0.4 mg total) under the tongue every 5 (five) minutes as needed for chest pain. 25 tablet 2   sacubitril-valsartan (ENTRESTO) 24-26 MG Take 1 tablet by mouth 2 (two) times daily. 90 tablet 7   spironolactone (ALDACTONE) 25 MG tablet Take 0.5 tablets (12.5 mg total) by mouth daily. 45 tablet 3   No current facility-administered medications for this visit.     Family History  Problem Relation Age of Onset   Hypertension Mother        Physical Exam: Lungs: some wheezing Card: RR with 2/6 sem Ext: no edema Neuro: alert no focal deficits     Diagnostic Studies & Laboratory data: I have personally reviewed the following studies and agree with the findings   TTE (02/2022) IMPRESSIONS     1. Left ventricular ejection fraction, by  estimation, is 45 to 50%. The  left ventricle has mildly decreased function. The left ventricle  demonstrates global hypokinesis. There is mild left ventricular  hypertrophy. Left ventricular diastolic parameters  are consistent with Grade II diastolic dysfunction (pseudonormalization).   2. Right ventricular systolic function is normal. The right ventricular  size is normal.   3. Left atrial size was severely dilated.   4. The mitral valve is normal in structure. Severe  mitral valve  regurgitation.   5. The aortic valve is tricuspid. Aortic valve regurgitation is not  visualized. Aortic valve sclerosis is present, with no evidence of aortic  valve stenosis.   6. The inferior vena cava is normal in size with greater than 50%  respiratory variability, suggesting right atrial pressure of 3 mmHg.   Comparison(s): 10/29/20-EF 45-50%.   FINDINGS   Left Ventricle: Left ventricular ejection fraction, by estimation, is 45  to 50%. The left ventricle has mildly decreased function. The left  ventricle demonstrates global hypokinesis. Global longitudinal strain  performed but not reported based on  interpreter judgement due to suboptimal tracking. The left ventricular  internal cavity size was normal in size. There is mild left ventricular  hypertrophy. Left ventricular diastolic parameters are consistent with  Grade II diastolic dysfunction  (pseudonormalization).   Right Ventricle: The right ventricular size is normal. No increase in  right ventricular wall thickness. Right ventricular systolic function is  normal.   Left Atrium: Left atrial size was severely dilated.   Right Atrium: Right atrial size was normal in size.   Pericardium: There is no evidence of pericardial effusion.   Mitral Valve: The mitral valve is normal in structure. Severe mitral valve  regurgitation. MV peak gradient, 16.8 mmHg. The mean mitral valve gradient  is 4.0 mmHg.   Tricuspid Valve: The tricuspid valve  is normal in structure. Tricuspid  valve regurgitation is mild.   Aortic Valve: The aortic valve is tricuspid. Aortic valve regurgitation is  not visualized. Aortic valve sclerosis is present, with no evidence of  aortic valve stenosis. Aortic valve mean gradient measures 5.0 mmHg.  Aortic valve peak gradient measures 9.0   mmHg. Aortic valve area, by VTI measures 1.31 cm.   Pulmonic Valve: The pulmonic valve was normal in structure. Pulmonic valve  regurgitation is not visualized.   Aorta: The aortic root and ascending aorta are structurally normal, with  no evidence of dilitation.   Venous: The inferior vena cava is normal in size with greater than 50%  respiratory variability, suggesting right atrial pressure of 3 mmHg.   IAS/Shunts: No atrial level shunt detected by color flow Doppler.     LEFT VENTRICLE  PLAX 2D  LVIDd:         5.00 cm      Diastology  LVIDs:         3.80 cm      LV e' medial:    3.15 cm/s  LV PW:         1.30 cm      LV E/e' medial:  57.1  LV IVS:        1.30 cm      LV e' lateral:   3.49 cm/s  LVOT diam:     1.70 cm      LV E/e' lateral: 51.6  LV SV:         49  LV SV Index:   33  LVOT Area:     2.27 cm    LV Volumes (MOD)  LV vol d, MOD A2C: 120.0 ml  LV vol d, MOD A4C: 148.0 ml  LV vol s, MOD A4C: 50.2 ml  LV SV MOD A4C:     148.0 ml   RIGHT VENTRICLE            IVC  RV Basal diam:  4.40 cm    IVC diam: 1.50 cm  RV Mid diam:    2.90 cm  RV S prime:     6.36 cm/s  TAPSE (M-mode): 1.2 cm   LEFT ATRIUM              Index        RIGHT ATRIUM           Index  LA diam:        5.20 cm  3.46 cm/m   RA Area:     13.80 cm  LA Vol (A2C):   126.0 ml 83.78 ml/m  RA Volume:   40.50 ml  26.93 ml/m  LA Vol (A4C):   131.0 ml 87.11 ml/m  LA Biplane Vol: 129.0 ml 85.78 ml/m   AORTIC VALVE                     PULMONIC VALVE  AV Area (Vmax):    1.27 cm      PV Vmax:       0.88 m/s  AV Area (Vmean):   1.20 cm      PV Peak grad:  3.1 mmHg  AV Area  (VTI):     1.31 cm  AV Vmax:           150.00 cm/s  AV Vmean:          107.000 cm/s  AV VTI:            0.375 m  AV Peak Grad:      9.0 mmHg  AV Mean Grad:      5.0 mmHg  LVOT Vmax:         84.20 cm/s  LVOT Vmean:        56.400 cm/s  LVOT VTI:          0.216 m  LVOT/AV VTI ratio: 0.58    AORTA  Ao Root diam: 3.10 cm  Ao Asc diam:  3.10 cm  Ao Arch diam: 2.4 cm   MITRAL VALVE                TRICUSPID VALVE  MV Area (PHT): 1.19 cm     TR Peak grad:   42.2 mmHg  MV Area VTI:   0.61 cm     TR Vmax:        325.00 cm/s  MV Peak grad:  16.8 mmHg  MV Mean grad:  4.0 mmHg     SHUNTS  MV Vmax:       2.05 m/s     Systemic VTI:  0.22 m  MV Vmean:      81.8 cm/s    Systemic Diam: 1.70 cm  MV Decel Time: 638 msec  MR Peak grad: 103.2 mmHg  MR Mean grad: 62.0 mmHg  MR Vmax:      508.00 cm/s  MR Vmean:     370.0 cm/s  MV E velocity: 180.00 cm/s  MV A velocity: 74.00 cm/s  MV E/A ratio:  2.43   TEE (04/2022) IMPRESSIONS     1. Left ventricular ejection fraction, by estimation, is 55 to 60%. The  left ventricle has normal function.   2. Right ventricular systolic function is normal. The right ventricular  size is normal.   3. Left atrial size was severely dilated. No left atrial/left atrial  appendage thrombus was detected.   4. Right atrial size was mildly dilated.   5. 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 On 3D imaging just on lateral side of A2/P2  Restricted posterior leaflet Some calcium in sub chordal apparatus but  overall  valve is suitable for clip with possible use of Paschal device MvA  5.8 cm2, mean gradient at HR 53 bpm   is only 3 mmHg with peak of 10 mmHg Posterior leaflet length 17 mm . The  mitral valve is abnormal. Severe mitral valve regurgitation.   6. The aortic valve is tricuspid. There is mild calcification of the  aortic valve. There is mild thickening of the aortic valve. Aortic valve  regurgitation is not visualized. Aortic valve sclerosis is present, with  no  evidence of aortic valve stenosis.   FINDINGS   Left Ventricle: Left ventricular ejection fraction, by estimation, is 55  to 60%. The left ventricle has normal function. The left ventricular  internal cavity size was normal in size.   Right Ventricle: The right ventricular size is normal. Right vetricular  wall thickness was not assessed. Right ventricular systolic function is  normal.   Left Atrium: Left atrial size was severely dilated. No left atrial/left  atrial appendage thrombus was detected.   Right Atrium: Right atrial size was mildly dilated.   Pericardium: There is no evidence of pericardial effusion.   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 On 3D imaging just on lateral side  of A2/P2 Restricted posterior leaflet Some calcium in sub chordal  apparatus but overall valve is suitable for clip with possible use of  Paschal device MvA 5.8 cm2, mean gradient at   HR 53 bpm is only 3 mmHg with peak of 10 mmHg Posterior leaflet length 17  mm. The mitral valve is abnormal. Severe mitral valve regurgitation. MV  peak gradient, 10.0 mmHg. The mean mitral valve gradient is 3.0 mmHg.   Tricuspid Valve: The tricuspid valve is normal in structure. Tricuspid  valve regurgitation is mild.   Aortic Valve: The aortic valve is tricuspid. There is mild calcification  of the aortic valve. There is mild thickening of the aortic valve. Aortic  valve regurgitation is not visualized. Aortic valve sclerosis is present,  with no evidence of aortic valve  stenosis.   Pulmonic Valve: The pulmonic valve was normal in structure. Pulmonic valve  regurgitation is mild.   Aorta: The aortic root is normal in size and structure.   IAS/Shunts: No atrial level shunt detected by color flow Doppler.   Additional Comments: Spectral Doppler performed.   MITRAL VALVE  MV Peak grad: 10.0 mmHg  MV Mean grad: 3.0 mmHg  MV Vmax:      1.58 m/s  MV Vmean:     79.1 cm/s  MR Peak grad:    131.8 mmHg  MR  Mean grad:    86.0 mmHg  MR Vmax:         574.00 cm/s  MR Vmean:        444.0 cm/s  MR PISA:         9.05 cm  MR PISA Eff ROA: 49 mm  MR PISA Radius:  1.20 cm   CATH (03/2022) Conclusion      Prox LAD lesion is 10% stenosed.   Mid LAD lesion is 10% stenosed.   LPAV lesion is 60% stenosed.   1.  Patent proximal and mid LAD stents with mild disease elsewhere. 2.  LVEDP of 12 mmHg. 3.  Mean RA pressure of 1 mmHg, RV pressure 50/-2 with RV end-diastolic pressure of 4 mmHg, mean wedge pressure of 15 mmHg with V waves to 33 mmHg, and PA pressure 55/15 with a mean of 27 mmHg.  The cardiac output was 5.38 L/min  and the cardiac index was 3.47 L/min/m.   Recent Radiology Findings:       Recent Lab Findings: Lab Results  Component Value Date   WBC 7.4 04/11/2022   HGB 12.2 04/18/2022   HCT 36.0 04/18/2022   PLT 315 04/11/2022   GLUCOSE 106 (H) 07/31/2022   CHOL 156 06/18/2022   TRIG 133 06/18/2022   HDL 47 06/18/2022   LDLCALC 82 06/18/2022   ALT 25 07/19/2022   AST 22 07/19/2022   NA 139 07/31/2022   K 4.4 07/31/2022   CL 101 07/31/2022   CREATININE 1.11 (H) 07/31/2022   BUN 20 07/31/2022   CO2 26 07/31/2022   TSH 3.144 07/19/2022   INR 0.9 01/22/2021   HGBA1C 5.2 10/27/2020      Assessment / Plan:     77 yo female with NYHA class 2 symptoms of severe MR. Pt with previous CABG now with patent stents, chronic afib, COPD and PHTN. I have reviewed her studies and feel that reoperation for MV replacement is at some increased risk but with it being over 35 yrs, her CT scan of chest without hostile reentry anatomy, that her mortality at most 4-5%. She and her daughter originally felt there was no desire to undergo repeat surgery. We discussed her options with ongoing medical therapy, to discuss with Dr Tollie Pizza mitral clip success percentage, and to consider surgery now or when she feels her symptoms warrant taking the risk of surgery. They will arrange visit with Dr Lynnette Caffey   and will follow up with me if decision is for redo MVR   I have spent 60 min in review of the records, viewing studies and in face to face with patient and in coordination of future care    Eugenio Hoes 08/05/2022 6:38 PM

## 2022-08-06 ENCOUNTER — Encounter: Payer: Self-pay | Admitting: Thoracic Surgery (Cardiothoracic Vascular Surgery)

## 2022-08-06 ENCOUNTER — Other Ambulatory Visit: Payer: Self-pay | Admitting: Thoracic Surgery (Cardiothoracic Vascular Surgery)

## 2022-08-06 ENCOUNTER — Institutional Professional Consult (permissible substitution) (INDEPENDENT_AMBULATORY_CARE_PROVIDER_SITE_OTHER): Payer: 59 | Admitting: Thoracic Surgery (Cardiothoracic Vascular Surgery)

## 2022-08-06 VITALS — BP 122/67 | HR 53 | Resp 20 | Ht 61.0 in | Wt 127.0 lb

## 2022-08-06 DIAGNOSIS — I340 Nonrheumatic mitral (valve) insufficiency: Secondary | ICD-10-CM | POA: Diagnosis not present

## 2022-08-06 NOTE — Patient Instructions (Signed)
Discuss with Dr Tollie Pizza mitral clip option Follow up with me if surgery decided upon

## 2022-08-13 NOTE — Progress Notes (Unsigned)
Patient ID: Penny Mccullough MRN: [id number] DOB/AGE: [birth year] 76 y.o.  Primary Care Physician:Pcp, No Primary Cardiologist: [name]   FOCUSED CARDIOVASCULAR PROBLEM LIST:   1.  Coronary artery disease status post LIMA to LAD 1989; coronary angiography 2022 demonstrated the LIMA graft to be atretic and the patient received proximal and mid LAD stents 2.  COPD 3.  Hyperlipidemia 4.  Hypertension 5.  Atrial fibrillation on Eliquis 6.  Severe mitral regurgitation, Carpentier IIIa   HISTORY OF PRESENT ILLNESS:  February 2024: The patient is a 77 y.o. female with the indicated medical history here for recommendations regarding her severe mitral regurgitation.  The patient is here with her daughter.  She tells me that she has noticed increasing fatigue over the last several months.  She works as a Land once a week and notices that she cannot do as much without taking breaks.  She denies any significant shortness of breath on a regular basis but on occasion does wheeze.  She denies any exertional angina.  She denies any peripheral edema or paroxysmal nocturnal dyspnea.  She does suffer from some nuisance bruising at times.  She denies any significant or severe bleeding however.  She has not required any emergency room visits or hospitalizations.  She definitely feels like she cannot do as much versus last year.  In terms of her dental health she has both upper and lower dentures.  Plan: TEE, right heart catheterization, and coronary angiography.  May 2024: In the interim we reviewed her TEE in the multidisciplinary fashion.  It was thought that her valve disease looked to be more postinflammatory or rheumatic in nature.  She still continues to be short of breath with moderate exertion.  She denies any chest pain.  Fortunately she has not required any emergency room visits or hospitalizations.  She has had no bleeding or bruising episodes while on Eliquis.  She continues to be on  Plavix.  Plan: Refer to cardiothoracic surgery.  Today: The patient saw Dr. Leafy Ro recently had a multi disciplinary discussion about the patient recently.  Consensus opinion was that the patient would be best served by a surgical procedure rather than a mitral transcatheter edge-to-edge repair given her Carpentier Class IIIa pathology.  She is here to discuss further.  Past Medical History:  Diagnosis Date   CAD (coronary artery disease)    CHF (congestive heart failure) (HCC)    COPD (chronic obstructive pulmonary disease) (HCC)    CVA (cerebral vascular accident) (HCC)    HLD (hyperlipidemia)    Hypertension    MI (myocardial infarction) (HCC) 10/2020    Past Surgical History:  Procedure Laterality Date   CARDIOVERSION N/A 03/16/2021   Procedure: CARDIOVERSION;  Surgeon: Antonieta Iba, MD;  Location: [facility] ORS;  Service: Cardiovascular;  Laterality: N/A;   CORONARY ARTERY BYPASS GRAFT  1989   CORONARY STENT INTERVENTION N/A 10/28/2020   Procedure: CORONARY STENT INTERVENTION;  Surgeon: Alwyn Pea, MD;  Location: [facility] INVASIVE CV LAB;  Service: Cardiovascular;  Laterality: N/A;   LEFT HEART CATH AND CORONARY ANGIOGRAPHY N/A 10/28/2020   Procedure: LEFT HEART CATH AND CORONARY ANGIOGRAPHY;  Surgeon: Alwyn Pea, MD;  Location: [facility] INVASIVE CV LAB;  Service: Cardiovascular;  Laterality: N/A;   RIGHT/LEFT HEART CATH AND CORONARY ANGIOGRAPHY N/A 04/18/2022   Procedure: RIGHT/LEFT HEART CATH AND CORONARY ANGIOGRAPHY;  Surgeon: Orbie Pyo, MD;  Location: [facility] INVASIVE CV LAB;  Service: Cardiovascular;  Laterality: N/A;   TEE WITHOUT CARDIOVERSION N/A  05/01/2022   Procedure: TRANSESOPHAGEAL ECHOCARDIOGRAM (TEE);  Surgeon: Wendall Stade, MD;  Location: [facility] ENDOSCOPY;  Service: Cardiovascular;  Laterality: N/A;    Family History  Problem Relation Age of Onset   Hypertension Mother     Social History   Socioeconomic History   Marital status: Single    Spouse name:  Not on file   Number of children: Not on file   Years of education: Not on file   Highest education level: Not on file  Occupational History   Not on file  Tobacco Use   Smoking status: Former    Packs/day: 1.00    Years: 30.00    Additional pack years: 0.00    Total pack years: 30.00    Types: Cigarettes    Quit date: 02/21/1987    Years since quitting: 35.4   Smokeless tobacco: Never  Vaping Use   Vaping Use: Never used  Substance and Sexual Activity   Alcohol use: Not Currently   Drug use: Never   Sexual activity: Not Currently  Other Topics Concern   Not on file  Social History Narrative   Not on file   Social Determinants of Health   Financial Resource Strain: Not on file  Food Insecurity: Not on file  Transportation Needs: Not on file  Physical Activity: Not on file  Stress: Not on file  Social Connections: Not on file  Intimate Partner Violence: Not on file     Prior to Admission medications   Medication Sig Start Date End Date Taking? Authorizing Provider  albuterol (VENTOLIN HFA) 108 (90 Base) MCG/ACT inhaler Inhale 2 puffs into the lungs every 6 (six) hours as needed for wheezing or shortness of breath. 01/09/21   Antonieta Iba, MD  amiodarone (PACERONE) 200 MG tablet Take 1 tablet (200 mg total) by mouth daily. Take 1 tablet daily 01/23/22   Antonieta Iba, MD  apixaban (ELIQUIS) 5 MG TABS tablet TAKE 1 TABLET BY MOUTH TWICE A DAY 11/13/21   Antonieta Iba, MD  atorvastatin (LIPITOR) 80 MG tablet TAKE 1 TABLET BY MOUTH EVERY DAY 01/15/22   Antonieta Iba, MD  clopidogrel (PLAVIX) 75 MG tablet TAKE 1 TABLET BY MOUTH EVERY DAY 01/29/22   Antonieta Iba, MD  empagliflozin (JARDIANCE) 10 MG TABS tablet TAKE 1 TABLET BY MOUTH EVERY DAY BEFORE BREAKFAST 02/05/22   Antonieta Iba, MD  furosemide (LASIX) 20 MG tablet TAKE 2 TABLETS (40 MG TOTAL) BY MOUTH DAILY. 01/29/22   Antonieta Iba, MD  metoprolol succinate (TOPROL-XL) 25 MG 24 hr tablet Take  0.5 tablet (12.5 mg) by mouth once daily 01/23/22   Furth, Cadence H, PA-C  nitroGLYCERIN (NITROSTAT) 0.4 MG SL tablet Place 1 tablet (0.4 mg total) under the tongue every 5 (five) minutes as needed for chest pain. 12/09/20   Furth, Cadence H, PA-C  potassium chloride (KLOR-CON) 10 MEQ tablet Take 2 tablets (20 mEq total) by mouth daily. 03/21/21   Delma Freeze, FNP    Allergies  Allergen Reactions   Codeine Nausea And Vomiting    REVIEW OF SYSTEMS:  General: no fevers/chills/night sweats Eyes: no blurry vision, diplopia, or amaurosis ENT: no sore throat or hearing loss Resp: no cough, wheezing, or hemoptysis CV: no edema or palpitations GI: no abdominal pain, nausea, vomiting, diarrhea, or constipation GU: no dysuria, frequency, or hematuria Skin: no rash Neuro: no headache, numbness, tingling, or weakness of extremities Musculoskeletal: no joint pain or swelling Heme: no bleeding,  DVT, or easy bruising Endo: no polydipsia or polyuria  There were no vitals taken for this visit.  PHYSICAL EXAM: GEN:  AO x 3 in no acute distress HEENT: normal Dentition: Dentures Neck: JVP normal. +2 carotid upstrokes without bruits. No thyromegaly. Lungs: equal expansion, clear bilaterally CV: Apex is discrete and nondisplaced, RRR with 3 out of 6 holosystolic murmur Abd: soft, non-tender, non-distended; no bruit; positive bowel sounds Ext: no edema, ecchymoses, or cyanosis Vascular: 2+ femoral pulses, 2+ radial pulses       Skin: warm and dry without rash Neuro: CN II-XII grossly intact; motor and sensory grossly intact    DATA AND STUDIES:  EKG: EKG done today that I personally reviewed demonstrates sinus bradycardia Q-wave in V1  2D ECHO: January 2024 1. Left ventricular ejection fraction, by estimation, is 45 to 50%. The  left ventricle has mildly decreased function. The left ventricle  demonstrates global hypokinesis. There is mild left ventricular  hypertrophy. Left ventricular  diastolic parameters  are consistent with Grade II diastolic dysfunction (pseudonormalization).   2. Right ventricular systolic function is normal. The right ventricular  size is normal.   3. Left atrial size was severely dilated.   4. The mitral valve is normal in structure. Severe mitral valve  regurgitation.   5. The aortic valve is tricuspid. Aortic valve regurgitation is not  visualized. Aortic valve sclerosis is present, with no evidence of aortic  valve stenosis.   6. The inferior vena cava is normal in size with greater than 50%  respiratory variability, suggesting right atrial pressure of 3 mmHg.   TEE: March 2024  1. Left ventricular ejection fraction, by estimation, is 55 to 60%. The  left ventricle has normal function.   2. Right ventricular systolic function is normal. The right ventricular  size is normal.   3. Left atrial size was severely dilated. No left atrial/left atrial  appendage thrombus was detected.   4. Right atrial size was mildly dilated.   5. 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 On 3D imaging just on lateral side of A2/P2  Restricted posterior leaflet Some calcium in sub chordal apparatus but  overall valve is suitable for clip with possible use of Paschal device MvA  5.8 cm2, mean gradient at HR 53 bpm   is only 3 mmHg with peak of 10 mmHg Posterior leaflet length 17 mm . The  mitral valve is abnormal. Severe mitral valve regurgitation.   6. The aortic valve is tricuspid. There is mild calcification of the  aortic valve. There is mild thickening of the aortic valve. Aortic valve  regurgitation is not visualized. Aortic valve sclerosis is present, with  no evidence of aortic valve stenosis.   CARDIAC CATH:  February 2024 1.  Patent proximal and mid LAD stents with mild disease elsewhere. 2.  LVEDP of 12 mmHg. 3.  Mean RA pressure of 1 mmHg, RV pressure 50/-2 with RV end-diastolic pressure of 4 mmHg, mean wedge pressure of 15 mmHg with V waves to 33 mmHg, and PA  pressure 55/15 with a mean of 27 mmHg.  The cardiac output was 5.38 L/min and the cardiac index was 3.47 L/min/m.  STS RISK CALCULATOR:   Procedure Type: Isolated MVR PERIOPERATIVE OUTCOME ESTIMATE % Operative Mortality 4.66% Morbidity & Mortality 19.4% Stroke 2.06% Renal Failure 2.29% Reoperation 3.63% Prolonged Ventilation 9.12% Deep Sternal Wound Infection 0.046% Long Hospital Stay (>14 days) 10.1% Short Hospital Stay (<6 days)* 20.9%   Procedure Type: Isolated MVr PERIOPERATIVE OUTCOME ESTIMATE %  Operative Mortality 1.61% Morbidity & Mortality 12.3% Stroke 2.12% Renal Failure 1.06% Reoperation 2.49% Prolonged Ventilation 5.93% Deep Sternal Wound Infection 0.039% Long Hospital Stay (>14 days) 6.09% Short Hospital Stay (<6 days)* 34.6%  NHYA CLASS: 2  ASSESSMENT AND PLAN:   Severe mitral valve regurgitation  Paroxysmal atrial fibrillation (HCC)  Chronic systolic heart failure (HCC)  Coronary artery disease of native artery of native heart with stable angina pectoris (HCC)  Consensus opinion is that the patient would be best served by surgical approach to treat her symptomatic mitral regurgitation.  It is anatomy that is not well-suited for transcatheter mitral edge-to-edge repair.  All there have been sporadic reports about this in the literature these are mainly in patients who are inoperable.  I had a discussion with Dr. Leafy Ro regarding the patient's operability.  She is not inoperable in his opinion but given prior sternotomy she is at increased risk.  Orbie Pyo, MD  08/13/2022 12:36 PM    [facility] [address] [place], [place], [place]  [address] Phone: [phone]; Fax: [phone]

## 2022-08-15 ENCOUNTER — Ambulatory Visit: Payer: 59 | Attending: Internal Medicine | Admitting: Internal Medicine

## 2022-08-15 ENCOUNTER — Encounter: Payer: Self-pay | Admitting: Internal Medicine

## 2022-08-15 VITALS — BP 120/80 | HR 66 | Ht 61.0 in | Wt 124.4 lb

## 2022-08-15 DIAGNOSIS — I25118 Atherosclerotic heart disease of native coronary artery with other forms of angina pectoris: Secondary | ICD-10-CM | POA: Diagnosis not present

## 2022-08-15 DIAGNOSIS — I5022 Chronic systolic (congestive) heart failure: Secondary | ICD-10-CM

## 2022-08-15 DIAGNOSIS — I480 Paroxysmal atrial fibrillation: Secondary | ICD-10-CM

## 2022-08-15 DIAGNOSIS — I340 Nonrheumatic mitral (valve) insufficiency: Secondary | ICD-10-CM

## 2022-08-15 NOTE — Patient Instructions (Addendum)
Medication Instructions:  No changes *If you need a refill on your cardiac medications before your next appointment, please call your pharmacy*   Lab Work: Today: pro bnp  If you have labs (blood work) drawn today and your tests are completely normal, you will receive your results only by: MyChart Message (if you have MyChart) OR A paper copy in the mail If you have any lab test that is abnormal or we need to change your treatment, we will call you to review the results.   Testing/Procedures: Your physician has requested that you have an echocardiogram. Echocardiography is a painless test that uses sound waves to create images of your heart. It provides your doctor with information about the size and shape of your heart and how well your heart's chambers and valves are working. This procedure takes approximately one hour. There are no restrictions for this procedure. Please do NOT wear cologne, perfume, aftershave, or lotions (deodorant is allowed). Please arrive 15 minutes prior to your appointment time.  Your physician has requested that you have an exercise tolerance test. For further information please visit [email or web address] Please also follow instruction sheet, as given.   Follow-Up: SCHEDULED IN DECEMBER 2024 At [facility], you and your health needs are our priority.  As part of our continuing mission to provide you with exceptional heart care, we have created designated Provider Care Teams.  These Care Teams include your primary Cardiologist (physician) and Advanced Practice Providers (APPs -  Physician Assistants and Nurse Practitioners) who all work together to provide you with the care you need, when you need it.  We recommend signing up for the patient portal called "MyChart".  Sign up information is provided on this After Visit Summary.  MyChart is used to connect with patients for Virtual Visits (Telemedicine).  Patients are able to view lab/test results,  encounter notes, upcoming appointments, etc.  Non-urgent messages can be sent to your provider as well.   To learn more about what you can do with MyChart, go to [email or web address].    Your next appointment:   6 month(s)  Provider:   Alverda Skeans, MD or Georgie Chard, NP

## 2022-08-16 LAB — PRO B NATRIURETIC PEPTIDE: NT-Pro BNP: 435 pg/mL (ref 0–738)

## 2022-08-30 ENCOUNTER — Encounter: Payer: 59 | Admitting: Cardiology

## 2022-09-13 ENCOUNTER — Ambulatory Visit (INDEPENDENT_AMBULATORY_CARE_PROVIDER_SITE_OTHER): Payer: 59

## 2022-09-13 ENCOUNTER — Ambulatory Visit: Payer: 59 | Attending: Cardiovascular Disease

## 2022-09-13 DIAGNOSIS — I5022 Chronic systolic (congestive) heart failure: Secondary | ICD-10-CM | POA: Insufficient documentation

## 2022-09-13 DIAGNOSIS — I25118 Atherosclerotic heart disease of native coronary artery with other forms of angina pectoris: Secondary | ICD-10-CM

## 2022-09-13 DIAGNOSIS — I340 Nonrheumatic mitral (valve) insufficiency: Secondary | ICD-10-CM | POA: Diagnosis not present

## 2022-09-13 LAB — ECHOCARDIOGRAM COMPLETE
Area-P 1/2: 1.61 cm2
MV M vel: 5.18 m/s
MV Peak grad: 107.3 mmHg
MV VTI: 1.17 cm2
P 1/2 time: 845 msec
S' Lateral: 2.9 cm

## 2022-09-13 LAB — EXERCISE TOLERANCE TEST
Angina Index: 0
Duke Treadmill Score: 4
Estimated workload: 5.8
Exercise duration (min): 4 min
Exercise duration (sec): 1 s
MPHR: 145 {beats}/min
Peak HR: 111 {beats}/min
Percent HR: 76 %
RPE: 15
Rest HR: 55 {beats}/min
ST Depression (mm): 0 mm

## 2022-09-25 ENCOUNTER — Other Ambulatory Visit: Payer: Self-pay

## 2022-09-25 DIAGNOSIS — I340 Nonrheumatic mitral (valve) insufficiency: Secondary | ICD-10-CM

## 2022-10-11 NOTE — Progress Notes (Signed)
[facility]   MULTIDISCIPLINARY HEART VALVE CLINIC                                     Cardiology Office Note:    Date:  10/16/2022   ID:  Penny Mccullough, DOB [birth year], MRN [id number]  PCP:  Aviva Kluver  [facility] Cardiologist:  Dr. Mariah Milling, MD/ Dr. Shirlee Latch, MD/ Dr. Lynnette Caffey, MD   [facility] Electrophysiologist:  None   Referring MD: No ref. provider found   Chief Complaint  Patient presents with   Follow-up    Pre TEE    History of Present Illness:    Penny Mccullough is a 77 y.o. female with a hx of CAD, COPD, paroxysmal atrial fibrillation, CHF, and severe functional rheumatic mitral regurgitation who was referred to Dr. Lynnette Caffey for possible mTEER however this was deferred in the setting of rheumatic valve disease and is now being considered for MVR. The patient is here today to discuss TEE risks/benefits.   Ms. Evon underwent CABG with LIMA-LAD in 1989 with subsequent NSTEMI requiring stenting of the ostial/mid LAD and noted to have an atretic LIMA 10/2020. More recently she has been seen for the evaluation of exertional dyspnea at which time echocardiogram showed an EF 45-50%, mild LVH, normal RV, severe MR. She was referred for TEE 04/2022 that confirmed severe MR with restricted posterior leaflet. R/LHC showed prominent v-waves in PCWP tracing and nonobstrutive CAD. There has been concern that the MR is rheumatic/inflammatory which is not not generally treated with mTEER. She was then referred to Dr. Leafy Ro for surgical evaluation and was felt to not be inoperable however was considered higher risk due to prior sternotomy. She saw Dr. Lynnette Caffey 6/26 and was rather reluctant to undergo surgery as she was doing well with no significant symptoms and no pronounced murmur on exam. Plan was for repeat echo and exercise stress testing. Echo 7/25 was felt to be inconclusive to determine MR and TEE was ordered to observe mitral valve leaflets more closely.   Today she is here  with her daughter and reports that she has been doing very well with no recent hospitalizations, no c/o chest pain, SOB, LE edema, orthopnea, or fatigue. Daughter seems confused about plan and feels they have been sent "from doctor to doctor with nothing being done". We had a long discussion about this process and the need for her and her family to take time to consider the risks and benefits of each option moving forward. They remain reluctant to consider surgery given that she is relatively asymptomatic and continues doing things which are important to her. They wish to move forward with TEE.   Past Medical History:  Diagnosis Date   CAD (coronary artery disease)    CHF (congestive heart failure) (HCC)    COPD (chronic obstructive pulmonary disease) (HCC)    CVA (cerebral vascular accident) (HCC)    HLD (hyperlipidemia)    Hypertension    MI (myocardial infarction) (HCC) 10/2020    Past Surgical History:  Procedure Laterality Date   CARDIOVERSION N/A 03/16/2021   Procedure: CARDIOVERSION;  Surgeon: Antonieta Iba, MD;  Location: [facility] ORS;  Service: Cardiovascular;  Laterality: N/A;   CORONARY ARTERY BYPASS GRAFT  1989   CORONARY STENT INTERVENTION N/A 10/28/2020   Procedure: CORONARY STENT INTERVENTION;  Surgeon: Alwyn Pea, MD;  Location: [facility] INVASIVE CV LAB;  Service: Cardiovascular;  Laterality:  N/A;   LEFT HEART CATH AND CORONARY ANGIOGRAPHY N/A 10/28/2020   Procedure: LEFT HEART CATH AND CORONARY ANGIOGRAPHY;  Surgeon: Alwyn Pea, MD;  Location: [facility] INVASIVE CV LAB;  Service: Cardiovascular;  Laterality: N/A;   RIGHT/LEFT HEART CATH AND CORONARY ANGIOGRAPHY N/A 04/18/2022   Procedure: RIGHT/LEFT HEART CATH AND CORONARY ANGIOGRAPHY;  Surgeon: Orbie Pyo, MD;  Location: [facility] INVASIVE CV LAB;  Service: Cardiovascular;  Laterality: N/A;   TEE WITHOUT CARDIOVERSION N/A 05/01/2022   Procedure: TRANSESOPHAGEAL ECHOCARDIOGRAM (TEE);  Surgeon: Wendall Stade, MD;   Location: [facility] ENDOSCOPY;  Service: Cardiovascular;  Laterality: N/A;    Current Medications: Current Meds  Medication Sig   acetaminophen (TYLENOL) 500 MG tablet Take 500-1,000 mg by mouth every 6 (six) hours as needed (pain.).   albuterol (VENTOLIN HFA) 108 (90 Base) MCG/ACT inhaler Inhale 2 puffs into the lungs every 6 (six) hours as needed for wheezing or shortness of breath.   amiodarone (PACERONE) 200 MG tablet Take 1 tablet (200 mg total) by mouth daily. Take 1 tablet daily   apixaban (ELIQUIS) 5 MG TABS tablet Take 1 tablet (5 mg total) by mouth 2 (two) times daily.   atorvastatin (LIPITOR) 80 MG tablet Take 1 tablet (80 mg total) by mouth daily.   empagliflozin (JARDIANCE) 10 MG TABS tablet TAKE 1 TABLET BY MOUTH EVERY DAY BEFORE BREAKFAST   furosemide (LASIX) 20 MG tablet Take 2 tablets (40 mg total) by mouth daily.   metoprolol succinate (TOPROL-XL) 25 MG 24 hr tablet Take 0.5 tablet (12.5 mg) by mouth once daily   nitroGLYCERIN (NITROSTAT) 0.4 MG SL tablet Place 1 tablet (0.4 mg total) under the tongue every 5 (five) minutes as needed for chest pain.   sacubitril-valsartan (ENTRESTO) 24-26 MG Take 1 tablet by mouth 2 (two) times daily.   spironolactone (ALDACTONE) 25 MG tablet Take 0.5 tablets (12.5 mg total) by mouth daily.     Allergies:   Codeine   Social History   Socioeconomic History   Marital status: Single    Spouse name: Not on file   Number of children: Not on file   Years of education: Not on file   Highest education level: Not on file  Occupational History   Not on file  Tobacco Use   Smoking status: Former    Current packs/day: 0.00    Average packs/day: 1 pack/day for 30.0 years (30.0 ttl pk-yrs)    Types: Cigarettes    Start date: 02/20/1957    Quit date: 02/21/1987    Years since quitting: 35.6   Smokeless tobacco: Never  Vaping Use   Vaping status: Never Used  Substance and Sexual Activity   Alcohol use: Not Currently   Drug use: Never   Sexual  activity: Not Currently  Other Topics Concern   Not on file  Social History Narrative   Not on file   Social Determinants of Health   Financial Resource Strain: Not on file  Food Insecurity: Not on file  Transportation Needs: Not on file  Physical Activity: Not on file  Stress: Not on file  Social Connections: Not on file    Family History: The patient's family history includes Hypertension in her mother.  ROS:   Please see the history of present illness.    All other systems reviewed and are negative.  EKGs/Labs/Other Studies Reviewed:    The following studies were reviewed today:  Cardiac Studies & Procedures   CARDIAC CATHETERIZATION  CARDIAC CATHETERIZATION 04/18/2022  Narrative  Prox LAD lesion is 10% stenosed.   Mid LAD lesion is 10% stenosed.   LPAV lesion is 60% stenosed.  1.  Patent proximal and mid LAD stents with mild disease elsewhere. 2.  LVEDP of 12 mmHg. 3.  Mean RA pressure of 1 mmHg, RV pressure 50/-2 with RV end-diastolic pressure of 4 mmHg, mean wedge pressure of 15 mmHg with V waves to 33 mmHg, and PA pressure 55/15 with a mean of 27 mmHg.  The cardiac output was 5.38 L/min and the cardiac index was 3.47 L/min/m.  Recommendation: Continue evaluation for mitral valve intervention.  Findings Coronary Findings Diagnostic  Dominance: Left  Left Anterior Descending Prox LAD lesion is 10% stenosed. The lesion was previously treated . Mid LAD lesion is 10% stenosed. The lesion was previously treated .  Left Circumflex  Left Posterior Atrioventricular Artery LPAV lesion is 60% stenosed.  Right Coronary Artery Vessel is small. The vessel exhibits minimal luminal irregularities.  Intervention  No interventions have been documented.   CARDIAC CATHETERIZATION  CARDIAC CATHETERIZATION 10/28/2020  Narrative   Ost LAD lesion is 75% stenosed.   Mid LAD lesion is 99% stenosed.   A drug-eluting stent was successfully placed using a STENT ONYX  FRONTIER 2.5X15.   A drug-eluting stent was successfully placed using a STENT ONYX FRONTIER 2.5X30.   A drug-eluting stent was successfully placed using a STENT ONYX FRONTIER 2.5X30.   Post intervention, there is a 0% residual stenosis.   Post intervention, there is a 0% residual stenosis.   The left ventricular systolic function is normal.   LV end diastolic pressure is normal.  Conclusion Cardiac cath Left main large free of disease Left ventricular function normal a EF of around 55% mild apical hypo- LAD large 75% ostial lesion 99% mid lesion ulcerated TIMI-3 flow Circumflex large left dominant free of disease RCA nondominant  Intervention PCI and stent to mid LAD with DES 2.5 x 30 mm DES PCI and stent to ostial LAD 2.5 x 15 mm DES Continue aspirin Brilinta for at least 12 months Angiomax for 2 hours then discontinue Cardiac care transferred back to [facility]. Dr Mariah Milling  Findings Coronary Findings Diagnostic  Dominance: Left  Left Anterior Descending Ost LAD lesion is 75% stenosed. Vessel is not the culprit lesion. The lesion is type A, located at the major branch, focal, discrete, irregular and ulcerative. The lesion was not previously treated . The stenosis was measured by a visual reading. Pressure wire/FFR was not performed on the lesion. IVUS was not performed. No optical coherence tomography (OCT) was performed. Mid LAD lesion is 99% stenosed. Vessel is the culprit lesion. The lesion is type A, located at the major branch, discrete, eccentric, irregular and ulcerative. The lesion was not previously treated . The stenosis was measured by a visual reading. Pressure wire/FFR was not performed on the lesion. IVUS was not performed. No optical coherence tomography (OCT) was performed.  Intervention  Ost LAD lesion Stent Lesion length:  10 mm. CATH VISTA GUIDE 6FRXBLAD3.5SH guide catheter was inserted. Lesion crossed with guidewire using a WIRE G HI TQ BMW 190. Pre-stent angioplasty  was not performed. A drug-eluting stent was successfully placed using a STENT ONYX FRONTIER 2.5X15. Maximum pressure: 13 atm. Inflation time: 10 sec. Minimum lumen area:  2.6 mm. Stent strut is well apposed. Stent does not overlap previously placed stentPost-stent angioplasty was not performed. Post-Intervention Lesion Assessment The intervention was successful. Intentional subintimal strategy was not used. Embolic protection device was not deployed.  The guidewire was not threaded through a graft to reach the lesion. Pre-interventional TIMI flow is 3. Post-intervention TIMI flow is 3. Treated lesion length:  10 mm. No complications occurred at this lesion. Pressure gradient/FFR was not measured. Marland KitchenUltrasound (IVUS) was not performed on the lesion. No optical coherence tomography (OCT) was performed. There is a 0% residual stenosis post intervention.  Mid LAD lesion Stent Lesion length:  22 mm. CATH VISTA GUIDE 6FRXBLAD3.5SH guide catheter was inserted. Lesion crossed with guidewire using a WIRE G HI TQ BMW 190. Pre-stent angioplasty was performed using a BALLOON TREK RX 2.5X15. Maximum pressure:  12 atm. Inflation time:  10 sec. A drug-eluting stent was successfully placed using a STENT ONYX FRONTIER 2.5X30. Maximum pressure: 13 atm. Inflation time: 10 sec. Minimum lumen area:  2.6 mm. Stent strut is well apposed. Stent does not overlap previously placed stentPost-stent angioplasty was performed using a BALLOON [place] TREK RX 2.5X15. Maximum pressure:  16 atm. Inflation time:  10 sec. Stent Lesion length:  22 mm. Lesion crossed with guidewire using a WIRE G HI TQ BMW 190. A drug-eluting stent was successfully placed using a STENT ONYX FRONTIER 2.5X30. Maximum pressure: 13 atm. Inflation time: 10 sec. Minimum lumen area:  2.6 mm. Stent strut is well apposed. Stent does not overlap previously placed stentPost-stent angioplasty was performed using a BALLOON [place] TREK RX 2.5X15. Maximum pressure:  16 atm. Inflation  time:  10 sec. Post-Intervention Lesion Assessment The intervention was successful. Intentional subintimal strategy was not used. Embolic protection device was not deployed. The guidewire was not threaded through a graft to reach the lesion. Pre-interventional TIMI flow is 3. Post-intervention TIMI flow is 3. Treated lesion length:  22 mm. No complications occurred at this lesion. Pressure gradient/FFR was not measured. Marland KitchenUltrasound (IVUS) was not performed on the lesion. No optical coherence tomography (OCT) was performed. There is a 0% residual stenosis post intervention.   STRESS TESTS  EXERCISE TOLERANCE TEST (ETT) 09/13/2022  Narrative   Patient exercised according to the BRUCE protocol for 4:42min achieving 5.8 METs consistent with fair exercise capacity   Target HR was not achieved (max HR 111bpm; 76% MPHR)   No ST deviation noted.   Study is inconclusive due to inability to reach target heart rate.  Laurance Flatten, MD   ECHOCARDIOGRAM  ECHOCARDIOGRAM COMPLETE 09/13/2022  Narrative ECHOCARDIOGRAM REPORT    Patient Name:   Penny Mccullough  Date of Exam: 09/13/2022 Medical Rec #:  [id number]     Height:       61.0 in Accession #:    [id number]    Weight:       124.4 lb Date of Birth:  [birth year]    BSA:          1.544 m Patient Age:    76 years      BP:           120/80 mmHg Patient Gender: F             HR:           58 bpm. Exam Location:  [address]  Procedure: 2D Echo, Cardiac Doppler and Color Doppler  Indications:    I05.9 Mitral valve disorder  History:        Patient has prior history of Echocardiogram examinations, most recent 02/21/2022. CHF, Previous Myocardial Infarction and CAD, COPD and CVA; Risk Factors:Former Smoker, Hypertension and Dyslipidemia.  Sonographer:    Daphine Deutscher RDCS Referring Phys: [id number] Orbie Pyo  IMPRESSIONS   1. Left ventricular ejection fraction, by estimation, is 60 to 65%. The left ventricle has normal  function. The left ventricle has no regional wall motion abnormalities. Left ventricular diastolic parameters are consistent with Grade I diastolic dysfunction (impaired relaxation). 2. Right ventricular systolic function is normal. The right ventricular size is normal. There is normal pulmonary artery systolic pressure. 3. Left atrial size was severely dilated. 4. History of severe mitral regurgitation. Compared to the TEE 04/2022 and TTE 02/2022, current images are more consistent with moderate mitral regurgitation. Regurgitant volume 40 mL by continuity equation. PISA was not obtained. The mitral valve is rheumatic. Moderate mitral valve regurgitation. No evidence of mitral stenosis. 5. Severe restriction of the posterior leaflet of the mitral valve leaflets. 6. The aortic valve is tricuspid. Aortic valve regurgitation is trivial. No aortic stenosis is present. 7. The inferior vena cava is normal in size with greater than 50% respiratory variability, suggesting right atrial pressure of 3 mmHg.  FINDINGS Left Ventricle: Left ventricular ejection fraction, by estimation, is 60 to 65%. The left ventricle has normal function. The left ventricle has no regional wall motion abnormalities. The left ventricular internal cavity size was normal in size. There is no left ventricular hypertrophy. Left ventricular diastolic parameters are consistent with Grade I diastolic dysfunction (impaired relaxation).  Right Ventricle: The right ventricular size is normal. No increase in right ventricular wall thickness. Right ventricular systolic function is normal. There is normal pulmonary artery systolic pressure. The tricuspid regurgitant velocity is 2.21 m/s, and with an assumed right atrial pressure of 3 mmHg, the estimated right ventricular systolic pressure is 22.5 mmHg.  Left Atrium: Left atrial size was severely dilated.  Right Atrium: Right atrial size was normal in size.  Pericardium: There is no evidence  of pericardial effusion.  Mitral Valve: History of severe mitral regurgitation. Compared to the TEE 04/2022 and TTE 02/2022, current images are more consistent with moderate mitral regurgitation. Regurgitant volume 40 mL by continuity equation. PISA was not obtained. The mitral valve is rheumatic. There is severe thickening of the anterior mitral valve leaflet(s). Severe restriction of the posterior leaflet of the mitral valve leaflets. Moderate mitral valve regurgitation. No evidence of mitral valve stenosis. MV peak gradient, 7.6 mmHg. The mean mitral valve gradient is 2.5 mmHg.  Tricuspid Valve: The tricuspid valve is normal in structure. Tricuspid valve regurgitation is trivial. No evidence of tricuspid stenosis.  Aortic Valve: The aortic valve is tricuspid. Aortic valve regurgitation is trivial. Aortic regurgitation PHT measures 845 msec. No aortic stenosis is present.  Pulmonic Valve: The pulmonic valve was normal in structure. Pulmonic valve regurgitation is not visualized. No evidence of pulmonic stenosis.  Aorta: The aortic root is normal in size and structure.  Venous: The inferior vena cava is normal in size with greater than 50% respiratory variability, suggesting right atrial pressure of 3 mmHg.  IAS/Shunts: No atrial level shunt detected by color flow Doppler.   LEFT VENTRICLE PLAX 2D LVIDd:         5.60 cm   Diastology LVIDs:         2.90 cm   LV e' medial:    5.82 cm/s LV PW:         0.80 cm   LV E/e' medial:  14.2 LV IVS:        0.60 cm   LV e' lateral:   9.68 cm/s LVOT diam:     1.80 cm   LV E/e' lateral: 8.5 LV  SV:         62 LV SV Index:   40 LVOT Area:     2.54 cm   RIGHT VENTRICLE            IVC RV Basal diam:  3.70 cm    IVC diam: 1.40 cm RV S prime:     7.13 cm/s TAPSE (M-mode): 1.4 cm  LEFT ATRIUM             Index        RIGHT ATRIUM          Index LA diam:        5.40 cm 3.50 cm/m   RA Area:     7.90 cm LA Vol (A2C):   77.5 ml 50.21 ml/m  RA Volume:    16.30 ml 10.56 ml/m LA Vol (A4C):   82.3 ml 53.28 ml/m LA Biplane Vol: 92.6 ml 59.99 ml/m AORTIC VALVE LVOT Vmax:   108.00 cm/s LVOT Vmean:  65.550 cm/s LVOT VTI:    0.242 m AI PHT:      845 msec  AORTA Ao Root diam: 3.10 cm Ao Asc diam:  3.10 cm  MITRAL VALVE               TRICUSPID VALVE MV Area (PHT): 1.61 cm    TR Peak grad:   19.5 mmHg MV Area VTI:   1.17 cm    TR Vmax:        221.00 cm/s MV Peak grad:  7.6 mmHg MV Mean grad:  2.5 mmHg    SHUNTS MV Vmax:       1.38 m/s    Systemic VTI:  0.24 m MV Vmean:      76.0 cm/s   Systemic Diam: 1.80 cm MV VTI:        0.53 m MV Decel Time: 472 msec MR Peak grad: 107.3 mmHg MR Mean grad: 82.0 mmHg MR Vmax:      518.00 cm/s MR Vmean:     433.5 cm/s MV E velocity: 82.55 cm/s MV A velocity: 94.15 cm/s MV E/A ratio:  0.88  Chilton Si MD Electronically signed by Chilton Si MD Signature Date/Time: 09/13/2022/1:53:00 PM    Final   TEE  ECHO TEE 05/01/2022  Narrative TRANSESOPHOGEAL ECHO REPORT    Patient Name:   Penny Mccullough Date of Exam: 05/01/2022 Medical Rec #:  [id number]    Height:       61.0 in Accession #:    [id number]   Weight:       125.0 lb Date of Birth:  July 30, 1945   BSA:          1.547 m Patient Age:    76 years     BP:           117/54 mmHg Patient Gender: F            HR:           51 bpm. Exam Location:  Inpatient  Procedure: 2D Echo, 3D Echo, Cardiac Doppler and Color Doppler  Indications:    Severe mitral regurgitation  History:        Patient has prior history of Echocardiogram examinations, most recent 02/21/2022. CHF, Previous Myocardial Infarction and CAD, COPD and Stroke; Risk Factors:Hypertension and Dyslipidemia.  Sonographer:    Milda Smart Referring Phys: [id number] Wendall Stade  PROCEDURE: After discussion of the risks and benefits of a TEE, an informed consent was obtained from the patient. TEE procedure  time was 15 minutes. The transesophogeal probe was passed without  difficulty through the esophogus of the patient. Imaged were obtained with the patient in a left lateral decubitus position. Sedation performed by different physician. The patient was monitored while under deep sedation. Anesthestetic sedation was provided intravenously by Anesthesiology: 190.62mg  of Propofol. Image quality was good. The patient's vital signs; including heart rate, blood pressure, and oxygen saturation; remained stable throughout the procedure. The patient developed no complications during the procedure.  IMPRESSIONS   1. Left ventricular ejection fraction, by estimation, is 55 to 60%. The left ventricle has normal function. 2. Right ventricular systolic function is normal. The right ventricular size is normal. 3. Left atrial size was severely dilated. No left atrial/left atrial appendage thrombus was detected. 4. Right atrial size was mildly dilated. 5. 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 On 3D imaging just on lateral side of A2/P2 Restricted posterior leaflet Some calcium in sub chordal apparatus but overall valve is suitable for clip with possible use of Paschal device MvA 5.8 cm2, mean gradient at HR 53 bpm is only 3 mmHg with peak of 10 mmHg Posterior leaflet length 17 mm . The mitral valve is abnormal. Severe mitral valve regurgitation. 6. The aortic valve is tricuspid. There is mild calcification of the aortic valve. There is mild thickening of the aortic valve. Aortic valve regurgitation is not visualized. Aortic valve sclerosis is present, with no evidence of aortic valve stenosis.  FINDINGS Left Ventricle: Left ventricular ejection fraction, by estimation, is 55 to 60%. The left ventricle has normal function. The left ventricular internal cavity size was normal in size.  Right Ventricle: The right ventricular size is normal. Right vetricular wall thickness was not assessed. Right ventricular systolic function is normal.  Left Atrium: Left atrial size was severely dilated. No left  atrial/left atrial appendage thrombus was detected.  Right Atrium: Right atrial size was mildly dilated.  Pericardium: There is no evidence of pericardial effusion.  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 On 3D imaging just on lateral side of A2/P2 Restricted posterior leaflet Some calcium in sub chordal apparatus but overall valve is suitable for clip with possible use of Paschal device MvA 5.8 cm2, mean gradient at HR 53 bpm is only 3 mmHg with peak of 10 mmHg Posterior leaflet length 17 mm. The mitral valve is abnormal. Severe mitral valve regurgitation. MV peak gradient, 10.0 mmHg. The mean mitral valve gradient is 3.0 mmHg.  Tricuspid Valve: The tricuspid valve is normal in structure. Tricuspid valve regurgitation is mild.  Aortic Valve: The aortic valve is tricuspid. There is mild calcification of the aortic valve. There is mild thickening of the aortic valve. Aortic valve regurgitation is not visualized. Aortic valve sclerosis is present, with no evidence of aortic valve stenosis.  Pulmonic Valve: The pulmonic valve was normal in structure. Pulmonic valve regurgitation is mild.  Aorta: The aortic root is normal in size and structure.  IAS/Shunts: No atrial level shunt detected by color flow Doppler.  Additional Comments: Spectral Doppler performed.  MITRAL VALVE MV Peak grad: 10.0 mmHg MV Mean grad: 3.0 mmHg MV Vmax:      1.58 m/s MV Vmean:     79.1 cm/s MR Peak grad:    131.8 mmHg MR Mean grad:    86.0 mmHg MR Vmax:         574.00 cm/s MR Vmean:        444.0 cm/s MR PISA:         9.05 cm MR  PISA Eff ROA: 49 mm MR PISA Radius:  1.20 cm  Charlton Haws MD Electronically signed by Charlton Haws MD Signature Date/Time: 05/01/2022/11:01:24 AM    Final            EKG:  EKG is not ordered today.    Recent Labs: 01/02/2022: Magnesium 2.2 07/19/2022: ALT 25; B Natriuretic Peptide 256.9; TSH 3.144 08/15/2022: NT-Pro BNP 435 10/15/2022: BUN 24; Creatinine, Ser 1.25;  Hemoglobin 12.4; Platelets 308; Potassium 4.4; Sodium 139   Recent Lipid Panel    Component Value Date/Time   CHOL 156 06/18/2022 1458   TRIG 133 06/18/2022 1458   HDL 47 06/18/2022 1458   CHOLHDL 3.3 06/18/2022 1458   VLDL 27 06/18/2022 1458   LDLCALC 82 06/18/2022 1458   Physical Exam:    VS:  BP 124/68   Pulse (!) 48   Ht 5\' 1"  (1.549 m)   Wt 134 lb (60.8 kg)   SpO2 96%   BMI 25.32 kg/m     Wt Readings from Last 3 Encounters:  10/15/22 134 lb (60.8 kg)  08/15/22 124 lb 6.4 oz (56.4 kg)  08/06/22 127 lb (57.6 kg)    General: Well developed, well nourished, NAD Lungs:Clear to ausculation bilaterally. No wheezes, rales, or rhonchi. Breathing is unlabored. Cardiovascular: RRR with S1 S2. No murmurs Extremities: No edema.  Neuro: Alert and oriented. No focal deficits. No facial asymmetry. MAE spontaneously. Psych: Responds to questions appropriately with normal affect.    ASSESSMENT/PLAN:    Severe MR: Initially seen by Dr. 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 Dr. Leafy Ro felt surgery was an option however given prior sternotomy, she was of course felt to be higher risk. Plan for TEE to re-evaluate MV. Instruction letter reviewed with all questions answered.   After careful review of history and examination, the risks and benefits of transesophageal echocardiogram have been explained including risks of esophageal damage, perforation (1:10,000 risk), bleeding, pharyngeal hematoma as well as other potential complications associated with conscious sedation including aspiration, arrhythmia, respiratory failure and death. Alternatives to treatment were discussed, questions were answered. Patient is willing to proceed.   CAD: s/p CABG and PCI to LAD. Denies anginal symptoms. Continue current regimen.   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 No change  today. Continue Entresto 24-26mg  BID, spiro 12.5mg  daily  COPD: Stable with no change today.   HLD: LDL stable, 82. Continue   HTN: Stable today at 124/68. No changes today. Continue   Paroxsymal atrial fibrillation: Maintaining NSR on Amio 200mg  daily, Toprol 12.5mg  daily. Anticoagulated with Eliquis 5mg  bid.   Medication Adjustments/Labs and Tests Ordered: Current medicines are reviewed at length with the patient today.  Concerns regarding medicines are outlined above.  Orders Placed This Encounter  Procedures   CBC   Basic metabolic panel   EKG 12-Lead   No orders of the defined types were placed in this encounter.   Patient Instructions  Medication Instructions:  Your physician recommends that you continue on your current medications as directed. Please refer to the Current Medication list given to you today.  *If you need a refill on your cardiac medications before your next appointment, please call your pharmacy*   Lab Work: BMP, CBC - TODAY   If you have labs (blood work) drawn today and your tests are completely normal, you will receive your results only by: MyChart Message (if you have  MyChart) OR A paper copy in the mail If you have any lab test that is abnormal or we need to change your treatment, we will call you to review the results.   Testing/Procedures: None ordered    Follow-Up: Follow up as scheduled   Other Instructions     Signed, Georgie Chard, NP  10/16/2022 9:00 AM    [facility]

## 2022-10-11 NOTE — H&P (View-Only) (Signed)
[facility]   MULTIDISCIPLINARY HEART VALVE CLINIC                                     Cardiology Office Note:    Date:  10/16/2022   ID:  Penny Mccullough, DOB [birth year], MRN [id number]  PCP:  Aviva Kluver  [facility] Cardiologist:  Dr. Mariah Milling, MD/ Dr. Shirlee Latch, MD/ Dr. Lynnette Caffey, MD   [facility] Electrophysiologist:  None   Referring MD: No ref. provider found   Chief Complaint  Patient presents with   Follow-up    Pre TEE    History of Present Illness:    Penny Mccullough is a 77 y.o. female with a hx of CAD, COPD, paroxysmal atrial fibrillation, CHF, and severe functional rheumatic mitral regurgitation who was referred to Dr. Lynnette Caffey for possible mTEER however this was deferred in the setting of rheumatic valve disease and is now being considered for MVR. The patient is here today to discuss TEE risks/benefits.   Penny Mccullough underwent CABG with LIMA-LAD in 1989 with subsequent NSTEMI requiring stenting of the ostial/mid LAD and noted to have an atretic LIMA 10/2020. More recently she has been seen for the evaluation of exertional dyspnea at which time echocardiogram showed an EF 45-50%, mild LVH, normal RV, severe MR. She was referred for TEE 04/2022 that confirmed severe MR with restricted posterior leaflet. R/LHC showed prominent v-waves in PCWP tracing and nonobstrutive CAD. There has been concern that the MR is rheumatic/inflammatory which is not not generally treated with mTEER. She was then referred to Dr. Leafy Ro for surgical evaluation and was felt to not be inoperable however was considered higher risk due to prior sternotomy. She saw Dr. Lynnette Caffey 6/26 and was rather reluctant to undergo surgery as she was doing well with no significant symptoms and no pronounced murmur on exam. Plan was for repeat echo and exercise stress testing. Echo 7/25 was felt to be inconclusive to determine MR and TEE was ordered to observe mitral valve leaflets more closely.   Today she is here  with her daughter and reports that she has been doing very well with no recent hospitalizations, no c/o chest pain, SOB, LE edema, orthopnea, or fatigue. Daughter seems confused about plan and feels they have been sent "from doctor to doctor with nothing being done". We had a long discussion about this process and the need for her and her family to take time to consider the risks and benefits of each option moving forward. They remain reluctant to consider surgery given that she is relatively asymptomatic and continues doing things which are important to her. They wish to move forward with TEE.   Past Medical History:  Diagnosis Date   CAD (coronary artery disease)    CHF (congestive heart failure) (HCC)    COPD (chronic obstructive pulmonary disease) (HCC)    CVA (cerebral vascular accident) (HCC)    HLD (hyperlipidemia)    Hypertension    MI (myocardial infarction) (HCC) 10/2020    Past Surgical History:  Procedure Laterality Date   CARDIOVERSION N/A 03/16/2021   Procedure: CARDIOVERSION;  Surgeon: Antonieta Iba, MD;  Location: [facility] ORS;  Service: Cardiovascular;  Laterality: N/A;   CORONARY ARTERY BYPASS GRAFT  1989   CORONARY STENT INTERVENTION N/A 10/28/2020   Procedure: CORONARY STENT INTERVENTION;  Surgeon: Alwyn Pea, MD;  Location: [facility] INVASIVE CV LAB;  Service: Cardiovascular;  Laterality:  N/A;   LEFT HEART CATH AND CORONARY ANGIOGRAPHY N/A 10/28/2020   Procedure: LEFT HEART CATH AND CORONARY ANGIOGRAPHY;  Surgeon: Alwyn Pea, MD;  Location: [facility] INVASIVE CV LAB;  Service: Cardiovascular;  Laterality: N/A;   RIGHT/LEFT HEART CATH AND CORONARY ANGIOGRAPHY N/A 04/18/2022   Procedure: RIGHT/LEFT HEART CATH AND CORONARY ANGIOGRAPHY;  Surgeon: Orbie Pyo, MD;  Location: [facility] INVASIVE CV LAB;  Service: Cardiovascular;  Laterality: N/A;   TEE WITHOUT CARDIOVERSION N/A 05/01/2022   Procedure: TRANSESOPHAGEAL ECHOCARDIOGRAM (TEE);  Surgeon: Wendall Stade, MD;   Location: [facility] ENDOSCOPY;  Service: Cardiovascular;  Laterality: N/A;    Current Medications: Current Meds  Medication Sig   acetaminophen (TYLENOL) 500 MG tablet Take 500-1,000 mg by mouth every 6 (six) hours as needed (pain.).   albuterol (VENTOLIN HFA) 108 (90 Base) MCG/ACT inhaler Inhale 2 puffs into the lungs every 6 (six) hours as needed for wheezing or shortness of breath.   amiodarone (PACERONE) 200 MG tablet Take 1 tablet (200 mg total) by mouth daily. Take 1 tablet daily   apixaban (ELIQUIS) 5 MG TABS tablet Take 1 tablet (5 mg total) by mouth 2 (two) times daily.   atorvastatin (LIPITOR) 80 MG tablet Take 1 tablet (80 mg total) by mouth daily.   empagliflozin (JARDIANCE) 10 MG TABS tablet TAKE 1 TABLET BY MOUTH EVERY DAY BEFORE BREAKFAST   furosemide (LASIX) 20 MG tablet Take 2 tablets (40 mg total) by mouth daily.   metoprolol succinate (TOPROL-XL) 25 MG 24 hr tablet Take 0.5 tablet (12.5 mg) by mouth once daily   nitroGLYCERIN (NITROSTAT) 0.4 MG SL tablet Place 1 tablet (0.4 mg total) under the tongue every 5 (five) minutes as needed for chest pain.   sacubitril-valsartan (ENTRESTO) 24-26 MG Take 1 tablet by mouth 2 (two) times daily.   spironolactone (ALDACTONE) 25 MG tablet Take 0.5 tablets (12.5 mg total) by mouth daily.     Allergies:   Codeine   Social History   Socioeconomic History   Marital status: Single    Spouse name: Not on file   Number of children: Not on file   Years of education: Not on file   Highest education level: Not on file  Occupational History   Not on file  Tobacco Use   Smoking status: Former    Current packs/day: 0.00    Average packs/day: 1 pack/day for 30.0 years (30.0 ttl pk-yrs)    Types: Cigarettes    Start date: 02/20/1957    Quit date: 02/21/1987    Years since quitting: 35.6   Smokeless tobacco: Never  Vaping Use   Vaping status: Never Used  Substance and Sexual Activity   Alcohol use: Not Currently   Drug use: Never   Sexual  activity: Not Currently  Other Topics Concern   Not on file  Social History Narrative   Not on file   Social Determinants of Health   Financial Resource Strain: Not on file  Food Insecurity: Not on file  Transportation Needs: Not on file  Physical Activity: Not on file  Stress: Not on file  Social Connections: Not on file    Family History: The patient's family history includes Hypertension in her mother.  ROS:   Please see the history of present illness.    All other systems reviewed and are negative.  EKGs/Labs/Other Studies Reviewed:    The following studies were reviewed today:  Cardiac Studies & Procedures   CARDIAC CATHETERIZATION  CARDIAC CATHETERIZATION 04/18/2022  Narrative  Prox LAD lesion is 10% stenosed.   Mid LAD lesion is 10% stenosed.   LPAV lesion is 60% stenosed.  1.  Patent proximal and mid LAD stents with mild disease elsewhere. 2.  LVEDP of 12 mmHg. 3.  Mean RA pressure of 1 mmHg, RV pressure 50/-2 with RV end-diastolic pressure of 4 mmHg, mean wedge pressure of 15 mmHg with V waves to 33 mmHg, and PA pressure 55/15 with a mean of 27 mmHg.  The cardiac output was 5.38 L/min and the cardiac index was 3.47 L/min/m.  Recommendation: Continue evaluation for mitral valve intervention.  Findings Coronary Findings Diagnostic  Dominance: Left  Left Anterior Descending Prox LAD lesion is 10% stenosed. The lesion was previously treated . Mid LAD lesion is 10% stenosed. The lesion was previously treated .  Left Circumflex  Left Posterior Atrioventricular Artery LPAV lesion is 60% stenosed.  Right Coronary Artery Vessel is small. The vessel exhibits minimal luminal irregularities.  Intervention  No interventions have been documented.   CARDIAC CATHETERIZATION  CARDIAC CATHETERIZATION 10/28/2020  Narrative   Ost LAD lesion is 75% stenosed.   Mid LAD lesion is 99% stenosed.   A drug-eluting stent was successfully placed using a STENT ONYX  FRONTIER 2.5X15.   A drug-eluting stent was successfully placed using a STENT ONYX FRONTIER 2.5X30.   A drug-eluting stent was successfully placed using a STENT ONYX FRONTIER 2.5X30.   Post intervention, there is a 0% residual stenosis.   Post intervention, there is a 0% residual stenosis.   The left ventricular systolic function is normal.   LV end diastolic pressure is normal.  Conclusion Cardiac cath Left main large free of disease Left ventricular function normal a EF of around 55% mild apical hypo- LAD large 75% ostial lesion 99% mid lesion ulcerated TIMI-3 flow Circumflex large left dominant free of disease RCA nondominant  Intervention PCI and stent to mid LAD with DES 2.5 x 30 mm DES PCI and stent to ostial LAD 2.5 x 15 mm DES Continue aspirin Brilinta for at least 12 months Angiomax for 2 hours then discontinue Cardiac care transferred back to [facility]. Dr Mariah Milling  Findings Coronary Findings Diagnostic  Dominance: Left  Left Anterior Descending Ost LAD lesion is 75% stenosed. Vessel is not the culprit lesion. The lesion is type A, located at the major branch, focal, discrete, irregular and ulcerative. The lesion was not previously treated . The stenosis was measured by a visual reading. Pressure wire/FFR was not performed on the lesion. IVUS was not performed. No optical coherence tomography (OCT) was performed. Mid LAD lesion is 99% stenosed. Vessel is the culprit lesion. The lesion is type A, located at the major branch, discrete, eccentric, irregular and ulcerative. The lesion was not previously treated . The stenosis was measured by a visual reading. Pressure wire/FFR was not performed on the lesion. IVUS was not performed. No optical coherence tomography (OCT) was performed.  Intervention  Ost LAD lesion Stent Lesion length:  10 mm. CATH VISTA GUIDE 6FRXBLAD3.5SH guide catheter was inserted. Lesion crossed with guidewire using a WIRE G HI TQ BMW 190. Pre-stent angioplasty  was not performed. A drug-eluting stent was successfully placed using a STENT ONYX FRONTIER 2.5X15. Maximum pressure: 13 atm. Inflation time: 10 sec. Minimum lumen area:  2.6 mm. Stent strut is well apposed. Stent does not overlap previously placed stentPost-stent angioplasty was not performed. Post-Intervention Lesion Assessment The intervention was successful. Intentional subintimal strategy was not used. Embolic protection device was not deployed.  The guidewire was not threaded through a graft to reach the lesion. Pre-interventional TIMI flow is 3. Post-intervention TIMI flow is 3. Treated lesion length:  10 mm. No complications occurred at this lesion. Pressure gradient/FFR was not measured. Marland KitchenUltrasound (IVUS) was not performed on the lesion. No optical coherence tomography (OCT) was performed. There is a 0% residual stenosis post intervention.  Mid LAD lesion Stent Lesion length:  22 mm. CATH VISTA GUIDE 6FRXBLAD3.5SH guide catheter was inserted. Lesion crossed with guidewire using a WIRE G HI TQ BMW 190. Pre-stent angioplasty was performed using a BALLOON TREK RX 2.5X15. Maximum pressure:  12 atm. Inflation time:  10 sec. A drug-eluting stent was successfully placed using a STENT ONYX FRONTIER 2.5X30. Maximum pressure: 13 atm. Inflation time: 10 sec. Minimum lumen area:  2.6 mm. Stent strut is well apposed. Stent does not overlap previously placed stentPost-stent angioplasty was performed using a BALLOON [place] TREK RX 2.5X15. Maximum pressure:  16 atm. Inflation time:  10 sec. Stent Lesion length:  22 mm. Lesion crossed with guidewire using a WIRE G HI TQ BMW 190. A drug-eluting stent was successfully placed using a STENT ONYX FRONTIER 2.5X30. Maximum pressure: 13 atm. Inflation time: 10 sec. Minimum lumen area:  2.6 mm. Stent strut is well apposed. Stent does not overlap previously placed stentPost-stent angioplasty was performed using a BALLOON [place] TREK RX 2.5X15. Maximum pressure:  16 atm. Inflation  time:  10 sec. Post-Intervention Lesion Assessment The intervention was successful. Intentional subintimal strategy was not used. Embolic protection device was not deployed. The guidewire was not threaded through a graft to reach the lesion. Pre-interventional TIMI flow is 3. Post-intervention TIMI flow is 3. Treated lesion length:  22 mm. No complications occurred at this lesion. Pressure gradient/FFR was not measured. Marland KitchenUltrasound (IVUS) was not performed on the lesion. No optical coherence tomography (OCT) was performed. There is a 0% residual stenosis post intervention.   STRESS TESTS  EXERCISE TOLERANCE TEST (ETT) 09/13/2022  Narrative   Patient exercised according to the BRUCE protocol for 4:42min achieving 5.8 METs consistent with fair exercise capacity   Target HR was not achieved (max HR 111bpm; 76% MPHR)   No ST deviation noted.   Study is inconclusive due to inability to reach target heart rate.  Laurance Flatten, MD   ECHOCARDIOGRAM  ECHOCARDIOGRAM COMPLETE 09/13/2022  Narrative ECHOCARDIOGRAM REPORT    Patient Name:   Penny Mccullough  Date of Exam: 09/13/2022 Medical Rec #:  [id number]     Height:       61.0 in Accession #:    [id number]    Weight:       124.4 lb Date of Birth:  [birth year]    BSA:          1.544 m Patient Age:    76 years      BP:           120/80 mmHg Patient Gender: F             HR:           58 bpm. Exam Location:  [address]  Procedure: 2D Echo, Cardiac Doppler and Color Doppler  Indications:    I05.9 Mitral valve disorder  History:        Patient has prior history of Echocardiogram examinations, most recent 02/21/2022. CHF, Previous Myocardial Infarction and CAD, COPD and CVA; Risk Factors:Former Smoker, Hypertension and Dyslipidemia.  Sonographer:    Daphine Deutscher RDCS Referring Phys: [id number] Orbie Pyo  IMPRESSIONS   1. Left ventricular ejection fraction, by estimation, is 60 to 65%. The left ventricle has normal  function. The left ventricle has no regional wall motion abnormalities. Left ventricular diastolic parameters are consistent with Grade I diastolic dysfunction (impaired relaxation). 2. Right ventricular systolic function is normal. The right ventricular size is normal. There is normal pulmonary artery systolic pressure. 3. Left atrial size was severely dilated. 4. History of severe mitral regurgitation. Compared to the TEE 04/2022 and TTE 02/2022, current images are more consistent with moderate mitral regurgitation. Regurgitant volume 40 mL by continuity equation. PISA was not obtained. The mitral valve is rheumatic. Moderate mitral valve regurgitation. No evidence of mitral stenosis. 5. Severe restriction of the posterior leaflet of the mitral valve leaflets. 6. The aortic valve is tricuspid. Aortic valve regurgitation is trivial. No aortic stenosis is present. 7. The inferior vena cava is normal in size with greater than 50% respiratory variability, suggesting right atrial pressure of 3 mmHg.  FINDINGS Left Ventricle: Left ventricular ejection fraction, by estimation, is 60 to 65%. The left ventricle has normal function. The left ventricle has no regional wall motion abnormalities. The left ventricular internal cavity size was normal in size. There is no left ventricular hypertrophy. Left ventricular diastolic parameters are consistent with Grade I diastolic dysfunction (impaired relaxation).  Right Ventricle: The right ventricular size is normal. No increase in right ventricular wall thickness. Right ventricular systolic function is normal. There is normal pulmonary artery systolic pressure. The tricuspid regurgitant velocity is 2.21 m/s, and with an assumed right atrial pressure of 3 mmHg, the estimated right ventricular systolic pressure is 22.5 mmHg.  Left Atrium: Left atrial size was severely dilated.  Right Atrium: Right atrial size was normal in size.  Pericardium: There is no evidence  of pericardial effusion.  Mitral Valve: History of severe mitral regurgitation. Compared to the TEE 04/2022 and TTE 02/2022, current images are more consistent with moderate mitral regurgitation. Regurgitant volume 40 mL by continuity equation. PISA was not obtained. The mitral valve is rheumatic. There is severe thickening of the anterior mitral valve leaflet(s). Severe restriction of the posterior leaflet of the mitral valve leaflets. Moderate mitral valve regurgitation. No evidence of mitral valve stenosis. MV peak gradient, 7.6 mmHg. The mean mitral valve gradient is 2.5 mmHg.  Tricuspid Valve: The tricuspid valve is normal in structure. Tricuspid valve regurgitation is trivial. No evidence of tricuspid stenosis.  Aortic Valve: The aortic valve is tricuspid. Aortic valve regurgitation is trivial. Aortic regurgitation PHT measures 845 msec. No aortic stenosis is present.  Pulmonic Valve: The pulmonic valve was normal in structure. Pulmonic valve regurgitation is not visualized. No evidence of pulmonic stenosis.  Aorta: The aortic root is normal in size and structure.  Venous: The inferior vena cava is normal in size with greater than 50% respiratory variability, suggesting right atrial pressure of 3 mmHg.  IAS/Shunts: No atrial level shunt detected by color flow Doppler.   LEFT VENTRICLE PLAX 2D LVIDd:         5.60 cm   Diastology LVIDs:         2.90 cm   LV e' medial:    5.82 cm/s LV PW:         0.80 cm   LV E/e' medial:  14.2 LV IVS:        0.60 cm   LV e' lateral:   9.68 cm/s LVOT diam:     1.80 cm   LV E/e' lateral: 8.5 LV  SV:         62 LV SV Index:   40 LVOT Area:     2.54 cm   RIGHT VENTRICLE            IVC RV Basal diam:  3.70 cm    IVC diam: 1.40 cm RV S prime:     7.13 cm/s TAPSE (M-mode): 1.4 cm  LEFT ATRIUM             Index        RIGHT ATRIUM          Index LA diam:        5.40 cm 3.50 cm/m   RA Area:     7.90 cm LA Vol (A2C):   77.5 ml 50.21 ml/m  RA Volume:    16.30 ml 10.56 ml/m LA Vol (A4C):   82.3 ml 53.28 ml/m LA Biplane Vol: 92.6 ml 59.99 ml/m AORTIC VALVE LVOT Vmax:   108.00 cm/s LVOT Vmean:  65.550 cm/s LVOT VTI:    0.242 m AI PHT:      845 msec  AORTA Ao Root diam: 3.10 cm Ao Asc diam:  3.10 cm  MITRAL VALVE               TRICUSPID VALVE MV Area (PHT): 1.61 cm    TR Peak grad:   19.5 mmHg MV Area VTI:   1.17 cm    TR Vmax:        221.00 cm/s MV Peak grad:  7.6 mmHg MV Mean grad:  2.5 mmHg    SHUNTS MV Vmax:       1.38 m/s    Systemic VTI:  0.24 m MV Vmean:      76.0 cm/s   Systemic Diam: 1.80 cm MV VTI:        0.53 m MV Decel Time: 472 msec MR Peak grad: 107.3 mmHg MR Mean grad: 82.0 mmHg MR Vmax:      518.00 cm/s MR Vmean:     433.5 cm/s MV E velocity: 82.55 cm/s MV A velocity: 94.15 cm/s MV E/A ratio:  0.88  Chilton Si MD Electronically signed by Chilton Si MD Signature Date/Time: 09/13/2022/1:53:00 PM    Final   TEE  ECHO TEE 05/01/2022  Narrative TRANSESOPHOGEAL ECHO REPORT    Patient Name:   Penny Mccullough Date of Exam: 05/01/2022 Medical Rec #:  [id number]    Height:       61.0 in Accession #:    [id number]   Weight:       125.0 lb Date of Birth:  July 30, 1945   BSA:          1.547 m Patient Age:    76 years     BP:           117/54 mmHg Patient Gender: F            HR:           51 bpm. Exam Location:  Inpatient  Procedure: 2D Echo, 3D Echo, Cardiac Doppler and Color Doppler  Indications:    Severe mitral regurgitation  History:        Patient has prior history of Echocardiogram examinations, most recent 02/21/2022. CHF, Previous Myocardial Infarction and CAD, COPD and Stroke; Risk Factors:Hypertension and Dyslipidemia.  Sonographer:    Milda Smart Referring Phys: [id number] Wendall Stade  PROCEDURE: After discussion of the risks and benefits of a TEE, an informed consent was obtained from the patient. TEE procedure  time was 15 minutes. The transesophogeal probe was passed without  difficulty through the esophogus of the patient. Imaged were obtained with the patient in a left lateral decubitus position. Sedation performed by different physician. The patient was monitored while under deep sedation. Anesthestetic sedation was provided intravenously by Anesthesiology: 190.62mg  of Propofol. Image quality was good. The patient's vital signs; including heart rate, blood pressure, and oxygen saturation; remained stable throughout the procedure. The patient developed no complications during the procedure.  IMPRESSIONS   1. Left ventricular ejection fraction, by estimation, is 55 to 60%. The left ventricle has normal function. 2. Right ventricular systolic function is normal. The right ventricular size is normal. 3. Left atrial size was severely dilated. No left atrial/left atrial appendage thrombus was detected. 4. Right atrial size was mildly dilated. 5. 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 On 3D imaging just on lateral side of A2/P2 Restricted posterior leaflet Some calcium in sub chordal apparatus but overall valve is suitable for clip with possible use of Paschal device MvA 5.8 cm2, mean gradient at HR 53 bpm is only 3 mmHg with peak of 10 mmHg Posterior leaflet length 17 mm . The mitral valve is abnormal. Severe mitral valve regurgitation. 6. The aortic valve is tricuspid. There is mild calcification of the aortic valve. There is mild thickening of the aortic valve. Aortic valve regurgitation is not visualized. Aortic valve sclerosis is present, with no evidence of aortic valve stenosis.  FINDINGS Left Ventricle: Left ventricular ejection fraction, by estimation, is 55 to 60%. The left ventricle has normal function. The left ventricular internal cavity size was normal in size.  Right Ventricle: The right ventricular size is normal. Right vetricular wall thickness was not assessed. Right ventricular systolic function is normal.  Left Atrium: Left atrial size was severely dilated. No left  atrial/left atrial appendage thrombus was detected.  Right Atrium: Right atrial size was mildly dilated.  Pericardium: There is no evidence of pericardial effusion.  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 On 3D imaging just on lateral side of A2/P2 Restricted posterior leaflet Some calcium in sub chordal apparatus but overall valve is suitable for clip with possible use of Paschal device MvA 5.8 cm2, mean gradient at HR 53 bpm is only 3 mmHg with peak of 10 mmHg Posterior leaflet length 17 mm. The mitral valve is abnormal. Severe mitral valve regurgitation. MV peak gradient, 10.0 mmHg. The mean mitral valve gradient is 3.0 mmHg.  Tricuspid Valve: The tricuspid valve is normal in structure. Tricuspid valve regurgitation is mild.  Aortic Valve: The aortic valve is tricuspid. There is mild calcification of the aortic valve. There is mild thickening of the aortic valve. Aortic valve regurgitation is not visualized. Aortic valve sclerosis is present, with no evidence of aortic valve stenosis.  Pulmonic Valve: The pulmonic valve was normal in structure. Pulmonic valve regurgitation is mild.  Aorta: The aortic root is normal in size and structure.  IAS/Shunts: No atrial level shunt detected by color flow Doppler.  Additional Comments: Spectral Doppler performed.  MITRAL VALVE MV Peak grad: 10.0 mmHg MV Mean grad: 3.0 mmHg MV Vmax:      1.58 m/s MV Vmean:     79.1 cm/s MR Peak grad:    131.8 mmHg MR Mean grad:    86.0 mmHg MR Vmax:         574.00 cm/s MR Vmean:        444.0 cm/s MR PISA:         9.05 cm MR  PISA Eff ROA: 49 mm MR PISA Radius:  1.20 cm  Charlton Haws MD Electronically signed by Charlton Haws MD Signature Date/Time: 05/01/2022/11:01:24 AM    Final            EKG:  EKG is not ordered today.    Recent Labs: 01/02/2022: Magnesium 2.2 07/19/2022: ALT 25; B Natriuretic Peptide 256.9; TSH 3.144 08/15/2022: NT-Pro BNP 435 10/15/2022: BUN 24; Creatinine, Ser 1.25;  Hemoglobin 12.4; Platelets 308; Potassium 4.4; Sodium 139   Recent Lipid Panel    Component Value Date/Time   CHOL 156 06/18/2022 1458   TRIG 133 06/18/2022 1458   HDL 47 06/18/2022 1458   CHOLHDL 3.3 06/18/2022 1458   VLDL 27 06/18/2022 1458   LDLCALC 82 06/18/2022 1458   Physical Exam:    VS:  BP 124/68   Pulse (!) 48   Ht 5\' 1"  (1.549 m)   Wt 134 lb (60.8 kg)   SpO2 96%   BMI 25.32 kg/m     Wt Readings from Last 3 Encounters:  10/15/22 134 lb (60.8 kg)  08/15/22 124 lb 6.4 oz (56.4 kg)  08/06/22 127 lb (57.6 kg)    General: Well developed, well nourished, NAD Lungs:Clear to ausculation bilaterally. No wheezes, rales, or rhonchi. Breathing is unlabored. Cardiovascular: RRR with S1 S2. No murmurs Extremities: No edema.  Neuro: Alert and oriented. No focal deficits. No facial asymmetry. MAE spontaneously. Psych: Responds to questions appropriately with normal affect.    ASSESSMENT/PLAN:    Severe MR: Initially seen by Dr. 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 Dr. Leafy Ro felt surgery was an option however given prior sternotomy, she was of course felt to be higher risk. Plan for TEE to re-evaluate MV. Instruction letter reviewed with all questions answered.   After careful review of history and examination, the risks and benefits of transesophageal echocardiogram have been explained including risks of esophageal damage, perforation (1:10,000 risk), bleeding, pharyngeal hematoma as well as other potential complications associated with conscious sedation including aspiration, arrhythmia, respiratory failure and death. Alternatives to treatment were discussed, questions were answered. Patient is willing to proceed.   CAD: s/p CABG and PCI to LAD. Denies anginal symptoms. Continue current regimen.   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 No change  today. Continue Entresto 24-26mg  BID, spiro 12.5mg  daily  COPD: Stable with no change today.   HLD: LDL stable, 82. Continue   HTN: Stable today at 124/68. No changes today. Continue   Paroxsymal atrial fibrillation: Maintaining NSR on Amio 200mg  daily, Toprol 12.5mg  daily. Anticoagulated with Eliquis 5mg  bid.   Medication Adjustments/Labs and Tests Ordered: Current medicines are reviewed at length with the patient today.  Concerns regarding medicines are outlined above.  Orders Placed This Encounter  Procedures   CBC   Basic metabolic panel   EKG 12-Lead   No orders of the defined types were placed in this encounter.   Patient Instructions  Medication Instructions:  Your physician recommends that you continue on your current medications as directed. Please refer to the Current Medication list given to you today.  *If you need a refill on your cardiac medications before your next appointment, please call your pharmacy*   Lab Work: BMP, CBC - TODAY   If you have labs (blood work) drawn today and your tests are completely normal, you will receive your results only by: MyChart Message (if you have  MyChart) OR A paper copy in the mail If you have any lab test that is abnormal or we need to change your treatment, we will call you to review the results.   Testing/Procedures: None ordered    Follow-Up: Follow up as scheduled   Other Instructions     Signed, Georgie Chard, NP  10/16/2022 9:00 AM    [facility]

## 2022-10-15 ENCOUNTER — Encounter: Payer: Self-pay | Admitting: Internal Medicine

## 2022-10-15 ENCOUNTER — Ambulatory Visit: Payer: 59 | Attending: Cardiology | Admitting: Cardiology

## 2022-10-15 VITALS — BP 124/68 | HR 48 | Ht 61.0 in | Wt 134.0 lb

## 2022-10-15 DIAGNOSIS — I5022 Chronic systolic (congestive) heart failure: Secondary | ICD-10-CM | POA: Diagnosis not present

## 2022-10-15 DIAGNOSIS — I255 Ischemic cardiomyopathy: Secondary | ICD-10-CM | POA: Diagnosis not present

## 2022-10-15 DIAGNOSIS — I480 Paroxysmal atrial fibrillation: Secondary | ICD-10-CM

## 2022-10-15 DIAGNOSIS — I340 Nonrheumatic mitral (valve) insufficiency: Secondary | ICD-10-CM | POA: Diagnosis not present

## 2022-10-15 DIAGNOSIS — I25118 Atherosclerotic heart disease of native coronary artery with other forms of angina pectoris: Secondary | ICD-10-CM

## 2022-10-15 DIAGNOSIS — J449 Chronic obstructive pulmonary disease, unspecified: Secondary | ICD-10-CM

## 2022-10-15 DIAGNOSIS — I10 Essential (primary) hypertension: Secondary | ICD-10-CM

## 2022-10-15 LAB — BASIC METABOLIC PANEL
BUN/Creatinine Ratio: 19 (ref 12–28)
BUN: 24 mg/dL (ref 8–27)
CO2: 24 mmol/L (ref 20–29)
Calcium: 9.1 mg/dL (ref 8.7–10.3)
Chloride: 104 mmol/L (ref 96–106)
Creatinine, Ser: 1.25 mg/dL — ABNORMAL HIGH (ref 0.57–1.00)
Glucose: 78 mg/dL (ref 70–99)
Potassium: 4.4 mmol/L (ref 3.5–5.2)
Sodium: 139 mmol/L (ref 134–144)
eGFR: 45 mL/min/{1.73_m2} — ABNORMAL LOW (ref >59)

## 2022-10-15 LAB — CBC
Hematocrit: 37.6 % (ref 34.0–46.6)
Hemoglobin: 12.4 g/dL (ref 11.1–15.9)
MCH: 30.6 pg (ref 26.6–33.0)
MCHC: 33 g/dL (ref 31.5–35.7)
MCV: 93 fL (ref 79–97)
Platelets: 308 10*3/uL (ref 150–450)
RBC: 4.05 x10E6/uL (ref 3.77–5.28)
RDW: 15.9 % — ABNORMAL HIGH (ref 11.7–15.4)
WBC: 7.1 10*3/uL (ref 3.4–10.8)

## 2022-10-15 NOTE — Telephone Encounter (Signed)
error 

## 2022-10-15 NOTE — Patient Instructions (Signed)
Medication Instructions:  Your physician recommends that you continue on your current medications as directed. Please refer to the Current Medication list given to you today.  *If you need a refill on your cardiac medications before your next appointment, please call your pharmacy*   Lab Work: BMP, CBC - TODAY   If you have labs (blood work) drawn today and your tests are completely normal, you will receive your results only by: MyChart Message (if you have MyChart) OR A paper copy in the mail If you have any lab test that is abnormal or we need to change your treatment, we will call you to review the results.   Testing/Procedures: None ordered    Follow-Up: Follow up as scheduled   Other Instructions

## 2022-10-16 ENCOUNTER — Telehealth: Payer: Self-pay | Admitting: *Deleted

## 2022-10-16 NOTE — Telephone Encounter (Signed)
Left message to call office

## 2022-10-16 NOTE — Telephone Encounter (Signed)
-----   Message from Georgie Chard sent at 10/16/2022 10:25 AM EDT ----- Please let the patient know that her Cr is a little elevated above her baseline. She can still undergo her TEE but lets have her reduce her Lasix to 20mg  daily and can take an additional 20mg  for weight gain of 3lb in one day or 5lb in a week or ankle/leg edema. This should certainly help that come down nicely.

## 2022-10-18 MED ORDER — FUROSEMIDE 20 MG PO TABS: ORAL_TABLET | ORAL | Status: DC

## 2022-10-18 NOTE — Telephone Encounter (Signed)
Pt advised Jill's recommendations based on her labs and she verbalized understanding and was able to read back to me.

## 2022-10-25 NOTE — Progress Notes (Signed)
 Unable to reach patient about procedure, but was able to leave a detailed message. Stated that the patient needed to arrive at the hospital at 0830 , remain NPO after 0000, needs to have a ride home and a responsible adult to stay with them for 24 hours after the procedure. Instructed the patient to call back if they had any questions.

## 2022-10-26 ENCOUNTER — Ambulatory Visit (HOSPITAL_COMMUNITY)
Admission: RE | Admit: 2022-10-26 | Discharge: 2022-10-26 | Disposition: A | Discharge status: Home / Self Care | Payer: 59 | Attending: Cardiovascular Disease | Admitting: Cardiovascular Disease

## 2022-10-26 ENCOUNTER — Ambulatory Visit (HOSPITAL_BASED_OUTPATIENT_CLINIC_OR_DEPARTMENT_OTHER): Payer: 59 | Admitting: Anesthesiology

## 2022-10-26 ENCOUNTER — Encounter (HOSPITAL_COMMUNITY)
Admission: RE | Disposition: A | Discharge status: Home / Self Care | Payer: Self-pay | Source: Home / Self Care | Attending: Cardiovascular Disease

## 2022-10-26 ENCOUNTER — Ambulatory Visit (HOSPITAL_BASED_OUTPATIENT_CLINIC_OR_DEPARTMENT_OTHER)
Admission: RE | Admit: 2022-10-26 | Discharge: 2022-10-26 | Disposition: A | Discharge status: Home / Self Care | Payer: 59 | Source: Ambulatory Visit | Attending: Internal Medicine | Admitting: Internal Medicine

## 2022-10-26 ENCOUNTER — Ambulatory Visit (HOSPITAL_COMMUNITY): Payer: 59 | Admitting: Anesthesiology

## 2022-10-26 ENCOUNTER — Other Ambulatory Visit: Payer: Self-pay

## 2022-10-26 DIAGNOSIS — I340 Nonrheumatic mitral (valve) insufficiency: Secondary | ICD-10-CM

## 2022-10-26 DIAGNOSIS — Z87891 Personal history of nicotine dependence: Secondary | ICD-10-CM

## 2022-10-26 DIAGNOSIS — Z955 Presence of coronary angioplasty implant and graft: Secondary | ICD-10-CM | POA: Insufficient documentation

## 2022-10-26 DIAGNOSIS — I5042 Chronic combined systolic (congestive) and diastolic (congestive) heart failure: Secondary | ICD-10-CM | POA: Insufficient documentation

## 2022-10-26 DIAGNOSIS — Z79899 Other long term (current) drug therapy: Secondary | ICD-10-CM | POA: Diagnosis not present

## 2022-10-26 DIAGNOSIS — Z7901 Long term (current) use of anticoagulants: Secondary | ICD-10-CM | POA: Insufficient documentation

## 2022-10-26 DIAGNOSIS — Z8673 Personal history of transient ischemic attack (TIA), and cerebral infarction without residual deficits: Secondary | ICD-10-CM | POA: Diagnosis not present

## 2022-10-26 DIAGNOSIS — Z951 Presence of aortocoronary bypass graft: Secondary | ICD-10-CM | POA: Diagnosis not present

## 2022-10-26 DIAGNOSIS — E785 Hyperlipidemia, unspecified: Secondary | ICD-10-CM | POA: Insufficient documentation

## 2022-10-26 DIAGNOSIS — I480 Paroxysmal atrial fibrillation: Secondary | ICD-10-CM | POA: Insufficient documentation

## 2022-10-26 DIAGNOSIS — J449 Chronic obstructive pulmonary disease, unspecified: Secondary | ICD-10-CM | POA: Insufficient documentation

## 2022-10-26 DIAGNOSIS — I2510 Atherosclerotic heart disease of native coronary artery without angina pectoris: Secondary | ICD-10-CM | POA: Diagnosis not present

## 2022-10-26 DIAGNOSIS — I252 Old myocardial infarction: Secondary | ICD-10-CM | POA: Insufficient documentation

## 2022-10-26 DIAGNOSIS — I083 Combined rheumatic disorders of mitral, aortic and tricuspid valves: Secondary | ICD-10-CM | POA: Diagnosis not present

## 2022-10-26 DIAGNOSIS — I110 Hypertensive heart disease with heart failure: Secondary | ICD-10-CM | POA: Diagnosis not present

## 2022-10-26 HISTORY — PX: TEE WITHOUT CARDIOVERSION: SHX5443

## 2022-10-26 LAB — ECHO TEE
AV Mean grad: 5 mmHg
AV Peak grad: 9.2 mmHg
Ao pk vel: 1.52 m/s
MV M vel: 5.31 m/s
MV Peak grad: 112.8 mmHg
Radius: 1.2 cm

## 2022-10-26 SURGERY — ECHOCARDIOGRAM, TRANSESOPHAGEAL
Anesthesia: Monitor Anesthesia Care

## 2022-10-26 MED ORDER — SODIUM CHLORIDE 0.9 % IV SOLN: INTRAVENOUS | Status: DC

## 2022-10-26 MED ORDER — PROPOFOL 10 MG/ML IV BOLUS
INTRAVENOUS | Status: DC | PRN
  Administered 2022-10-26 (×3): 20 mg via INTRAVENOUS

## 2022-10-26 MED ORDER — EPHEDRINE SULFATE-NACL 50-0.9 MG/10ML-% IV SOSY
PREFILLED_SYRINGE | INTRAVENOUS | Status: DC | PRN
  Administered 2022-10-26: 2.5 mg via INTRAVENOUS

## 2022-10-26 MED ORDER — LIDOCAINE 2% (20 MG/ML) 5 ML SYRINGE
INTRAMUSCULAR | Status: DC | PRN
  Administered 2022-10-26: 40 mg via INTRAVENOUS

## 2022-10-26 MED ORDER — PROPOFOL 500 MG/50ML IV EMUL
INTRAVENOUS | Status: DC | PRN
  Administered 2022-10-26: 40 ug/kg/min via INTRAVENOUS

## 2022-10-26 MED ORDER — PHENYLEPHRINE HCL-NACL 20-0.9 MG/250ML-% IV SOLN
INTRAVENOUS | Status: DC | PRN
  Administered 2022-10-26: 10 ug/min via INTRAVENOUS

## 2022-10-26 NOTE — Anesthesia Preprocedure Evaluation (Addendum)
Anesthesia Evaluation  Patient identified by MRN, date of birth, ID band Patient awake    Reviewed: Allergy & Precautions, NPO status , Patient's Chart, lab work & pertinent test results, reviewed documented beta blocker date and time   Airway Mallampati: III  TM Distance: >3 FB Neck ROM: Full    Dental  (+) Edentulous Upper, Edentulous Lower   Pulmonary COPD,  COPD inhaler, former smoker   Pulmonary exam normal breath sounds clear to auscultation       Cardiovascular hypertension, Pt. on home beta blockers and Pt. on medications + CAD, + Past MI and +CHF  Normal cardiovascular exam+ Valvular Problems/Murmurs (severe MR) MR  Rhythm:Regular Rate:Normal  TTE 2024  1. Left ventricular ejection fraction, by estimation, is 45 to 50%. The  left ventricle has mildly decreased function. The left ventricle  demonstrates global hypokinesis. There is mild left ventricular  hypertrophy. Left ventricular diastolic parameters  are consistent with Grade II diastolic dysfunction (pseudonormalization).   2. Right ventricular systolic function is normal. The right ventricular  size is normal.   3. Left atrial size was severely dilated.   4. The mitral valve is normal in structure. Severe mitral valve  regurgitation.   5. The aortic valve is tricuspid. Aortic valve regurgitation is not  visualized. Aortic valve sclerosis is present, with no evidence of aortic  valve stenosis.   6. The inferior vena cava is normal in size with greater than 50%  respiratory variability, suggesting right atrial pressure of 3 mmHg.   Cath 2024 1.  Patent proximal and mid LAD stents with mild disease elsewhere. 2.  LVEDP of 12 mmHg. 3.  Mean RA pressure of 1 mmHg, RV pressure 50/-2 with RV end-diastolic pressure of 4 mmHg, mean wedge pressure of 15 mmHg with V waves to 33 mmHg, and PA pressure 55/15 with a mean of 27 mmHg.  The cardiac output was 5.38 L/min and the  cardiac index was 3.47 L/min/m.    Neuro/Psych CVA  negative psych ROS   GI/Hepatic negative GI ROS, Neg liver ROS,,,  Endo/Other    Renal/GU negative Renal ROS  negative genitourinary   Musculoskeletal negative musculoskeletal ROS (+)    Abdominal   Peds  Hematology negative hematology ROS (+) Plavix, eliquis   Anesthesia Other Findings   Reproductive/Obstetrics                             Anesthesia Physical Anesthesia Plan  ASA: 3  Anesthesia Plan: MAC   Post-op Pain Management:    Induction: Intravenous  PONV Risk Score and Plan: 3 and Treatment may vary due to age or medical condition, Ondansetron and Dexamethasone  Airway Management Planned: Natural Airway, Simple Face Mask and Mask  Additional Equipment: None  Intra-op Plan:   Post-operative Plan:   Informed Consent: I have reviewed the patients History and Physical, chart, labs and discussed the procedure including the risks, benefits and alternatives for the proposed anesthesia with the patient or authorized representative who has indicated his/her understanding and acceptance.     Dental advisory given  Plan Discussed with: CRNA and Anesthesiologist  Anesthesia Plan Comments:        Anesthesia Quick Evaluation

## 2022-10-26 NOTE — Interval H&P Note (Signed)
History and Physical Interval Note:  10/26/2022 11:24 AM  Penny Mccullough  has presented today for surgery, with the diagnosis of MR.  The various methods of treatment have been discussed with the patient and family. After consideration of risks, benefits and other options for treatment, the patient has consented to  Procedure(s): TRANSESOPHAGEAL ECHOCARDIOGRAM (N/A) as a surgical intervention.  The patient's history has been reviewed, patient examined, no change in status, stable for surgery.  I have reviewed the patient's chart and labs.  Questions were answered to the patient's satisfaction.     Charlton Haws

## 2022-10-26 NOTE — Anesthesia Postprocedure Evaluation (Signed)
Anesthesia Post Note  Patient: Penny Mccullough  Procedure(s) Performed: TRANSESOPHAGEAL ECHOCARDIOGRAM     Patient location during evaluation: PACU Anesthesia Type: MAC Level of consciousness: awake and alert Pain management: pain level controlled Vital Signs Assessment: post-procedure vital signs reviewed and stable Respiratory status: spontaneous breathing, nonlabored ventilation, respiratory function stable and patient connected to nasal cannula oxygen Cardiovascular status: stable and blood pressure returned to baseline Postop Assessment: no apparent nausea or vomiting Anesthetic complications: no  No notable events documented.  Last Vitals:  Vitals:   10/26/22 1140 10/26/22 1145  BP: (!) 109/59 122/60  Pulse: (!) 49 (!) 50  Resp: 20 17  Temp:  36.7 C  SpO2: 96% 93%    Last Pain:  Vitals:   10/26/22 1122  TempSrc: Temporal  PainSc: 0-No pain                 Kaynan Klonowski

## 2022-10-26 NOTE — CV Procedure (Signed)
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

## 2022-10-26 NOTE — Transfer of Care (Signed)
Immediate Anesthesia Transfer of Care Note  Patient: Penny Mccullough  Procedure(s) Performed: TRANSESOPHAGEAL ECHOCARDIOGRAM  Patient Location: PACU  Anesthesia Type:MAC  Level of Consciousness: awake and drowsy  Airway & Oxygen Therapy: Patient Spontanous Breathing and Patient connected to nasal cannula oxygen  Post-op Assessment: Report given to RN and Post -op Vital signs reviewed and stable  Post vital signs: Reviewed and stable  Last Vitals:  Vitals Value Taken Time  BP    Temp    Pulse    Resp    SpO2      Last Pain:  Vitals:   10/26/22 1037  TempSrc:   PainSc: 0-No pain         Complications: No notable events documented.

## 2022-10-29 ENCOUNTER — Telehealth: Payer: Self-pay

## 2022-10-29 ENCOUNTER — Encounter (HOSPITAL_COMMUNITY): Payer: Self-pay | Admitting: Cardiovascular Disease

## 2022-10-29 NOTE — Telephone Encounter (Signed)
-----   Message from Orbie Pyo sent at 10/26/2022 12:49 PM EDT ----- No go for mTEER, needs surgery with Leafy Ro

## 2022-10-29 NOTE — Telephone Encounter (Signed)
Per Dr. Lynnette Caffey, reiterated to the patient that due to her valve anatomy, she is not a candidate for mTEER.  After much discussion, she wishes to wait a bit before seeing Dr. Leafy Ro as she does not want to do surgery for a couple months.  Scheduled her with Dr. Leafy Ro 12/17/2022. She was grateful for call and agreed with plan.

## 2022-12-16 NOTE — Progress Notes (Unsigned)
[address]       [place] [address]             [phone]           Eylin String [facility] Medical Record #[id number] Date of Birth: 1945/08/01  Orbie Pyo, MD Pcp, No  Chief Complaint: Rheumatic mitral regurgitation    History of Present Illness:     Pt known to me from previous consult back in June. Pt is 77 yo and has had a previous CABG (LIMA to LAD) which had been found to be occluded 2 yrs ago with an MI and had LAD stented. PT with afib on eliquis and amiodarone and was noted to have severe MR and an EF of 45%. She was relatively asymptomatic in June and was felt to continue with medical management. A stress test in July failed to achieve target heart rate and was inconclusive. She had repeat TEE this past September with ongoing rheumatic mitral regurgitation and unchanged EF. She was not a candidate for mteer and was felt best to undergo surgical MVR. She wanted to discuss the operation and timing. She has COPD and not on inhalers or O2. 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 This has not changed much  from June. She was told to cut her diuretics in half but yesterday started to notice lower ext edema      Past Medical History:  Diagnosis Date   CAD (coronary artery disease)    CHF (congestive heart failure) (HCC)    COPD (chronic obstructive pulmonary disease) (HCC)    CVA (cerebral vascular accident) (HCC)    HLD (hyperlipidemia)    Hypertension    MI (myocardial infarction) (HCC) 10/2020    Past Surgical History:  Procedure Laterality Date   CARDIOVERSION N/A 03/16/2021   Procedure: CARDIOVERSION;  Surgeon: Antonieta Iba, MD;  Location: [facility] ORS;  Service: Cardiovascular;  Laterality: N/A;   CORONARY ARTERY BYPASS GRAFT  1989   CORONARY STENT INTERVENTION N/A 10/28/2020   Procedure: CORONARY STENT INTERVENTION;  Surgeon: Alwyn Pea, MD;   Location: [facility] INVASIVE CV LAB;  Service: Cardiovascular;  Laterality: N/A;   LEFT HEART CATH AND CORONARY ANGIOGRAPHY N/A 10/28/2020   Procedure: LEFT HEART CATH AND CORONARY ANGIOGRAPHY;  Surgeon: Alwyn Pea, MD;  Location: [facility] INVASIVE CV LAB;  Service: Cardiovascular;  Laterality: N/A;   RIGHT/LEFT HEART CATH AND CORONARY ANGIOGRAPHY N/A 04/18/2022   Procedure: RIGHT/LEFT HEART CATH AND CORONARY ANGIOGRAPHY;  Surgeon: Orbie Pyo, MD;  Location: [facility] INVASIVE CV LAB;  Service: Cardiovascular;  Laterality: N/A;   TEE WITHOUT CARDIOVERSION N/A 05/01/2022   Procedure: TRANSESOPHAGEAL ECHOCARDIOGRAM (TEE);  Surgeon: Wendall Stade, MD;  Location: [facility] ENDOSCOPY;  Service: Cardiovascular;  Laterality: N/A;   TEE WITHOUT CARDIOVERSION N/A 10/26/2022   Procedure: TRANSESOPHAGEAL ECHOCARDIOGRAM;  Surgeon: Wendall Stade, MD;  Location: [facility] INVASIVE CV LAB;  Service: Cardiovascular;  Laterality: N/A;    Social History   Tobacco Use  Smoking Status Former   Current packs/day: 0.00   Average packs/day: 1 pack/day for 30.0 years (30.0 ttl pk-yrs)   Types: Cigarettes   Start date: 02/20/1957   Quit date: 02/21/1987   Years since quitting: 35.8  Smokeless Tobacco Never    Social History   Substance and Sexual Activity  Alcohol Use Not Currently    Social History  Socioeconomic History   Marital status: Single    Spouse name: Not on file   Number of children: Not on file   Years of education: Not on file   Highest education level: Not on file  Occupational History   Not on file  Tobacco Use   Smoking status: Former    Current packs/day: 0.00    Average packs/day: 1 pack/day for 30.0 years (30.0 ttl pk-yrs)    Types: Cigarettes    Start date: 02/20/1957    Quit date: 02/21/1987    Years since quitting: 35.8   Smokeless tobacco: Never  Vaping Use   Vaping status: Never Used  Substance and Sexual Activity   Alcohol use: Not Currently   Drug use: Never   Sexual activity: Not  Currently  Other Topics Concern   Not on file  Social History Narrative   Not on file   Social Determinants of Health   Financial Resource Strain: Not on file  Food Insecurity: Not on file  Transportation Needs: Not on file  Physical Activity: Not on file  Stress: Not on file  Social Connections: Not on file  Intimate Partner Violence: Not on file    Allergies  Allergen Reactions   Codeine Nausea And Vomiting    Current Outpatient Medications  Medication Sig Dispense Refill   acetaminophen (TYLENOL) 500 MG tablet Take 500-1,000 mg by mouth every 6 (six) hours as needed (pain.).     albuterol (VENTOLIN HFA) 108 (90 Base) MCG/ACT inhaler Inhale 2 puffs into the lungs every 6 (six) hours as needed for wheezing or shortness of breath. 8 g 0   amiodarone (PACERONE) 200 MG tablet Take 1 tablet (200 mg total) by mouth daily. Take 1 tablet daily 90 tablet 3   apixaban (ELIQUIS) 5 MG TABS tablet Take 1 tablet (5 mg total) by mouth 2 (two) times daily. 180 tablet 3   atorvastatin (LIPITOR) 80 MG tablet Take 1 tablet (80 mg total) by mouth daily. 90 tablet 3   empagliflozin (JARDIANCE) 10 MG TABS tablet TAKE 1 TABLET BY MOUTH EVERY DAY BEFORE BREAKFAST 30 tablet 11   ezetimibe (ZETIA) 10 MG tablet Take 1 tablet (10 mg total) by mouth daily. 90 tablet 3   furosemide (LASIX) 20 MG tablet Take one tablet (20 mg) by mouth every day but may take an extra one tablet (20 mg) as needed.     metoprolol succinate (TOPROL-XL) 25 MG 24 hr tablet Take 0.5 tablet (12.5 mg) by mouth once daily 45 tablet 3   nitroGLYCERIN (NITROSTAT) 0.4 MG SL tablet Place 1 tablet (0.4 mg total) under the tongue every 5 (five) minutes as needed for chest pain. 25 tablet 2   sacubitril-valsartan (ENTRESTO) 24-26 MG Take 1 tablet by mouth 2 (two) times daily. 90 tablet 7   spironolactone (ALDACTONE) 25 MG tablet Take 0.5 tablets (12.5 mg total) by mouth daily. 45 tablet 3   No current facility-administered medications for  this visit.     Family History  Problem Relation Age of Onset   Hypertension Mother        Physical Exam:      Diagnostic Studies & Laboratory data: I have personally reviewed the following studies and agree with the findings   TTE (10/2022) IMPRESSIONS     1. Septal hypokinesis . Left ventricular ejection fraction, by  estimation, is 45 to 50%. The left ventricle has mildly decreased  function. The left ventricular internal cavity size was mildly dilated.  Left  ventricular diastolic function could not be  evaluated.   2. Right ventricular systolic function is normal. The right ventricular  size is normal.   3. Left atrial size was severely dilated. No left atrial/left atrial  appendage thrombus was detected.   4. Right atrial size was mildly dilated.   5. Thickened rheumatic valve with severe central MR ERO 0.52 cm2 RV 101  cc Mean gradient across valve 5 peak 13 mmHg at HR 65 bpm. The mitral  valve is rheumatic. Severe mitral valve regurgitation. Mild mitral  stenosis.   6. Tricuspid valve regurgitation is moderate.   7. The aortic valve is tricuspid. There is moderate calcification of the  aortic valve. There is moderate thickening of the aortic valve. Aortic  valve regurgitation is mild. Aortic valve sclerosis is present, with no  evidence of aortic valve stenosis.   FINDINGS   Left Ventricle: Septal hypokinesis. Left ventricular ejection fraction,  by estimation, is 45 to 50%. The left ventricle has mildly decreased  function. The left ventricular internal cavity size was mildly dilated.  There is no left ventricular  hypertrophy. Left ventricular diastolic function could not be evaluated.   Right Ventricle: The right ventricular size is normal. Right vetricular  wall thickness was not assessed. Right ventricular systolic function is  normal.   Left Atrium: Left atrial size was severely dilated. No left atrial/left  atrial appendage thrombus was detected.    Right Atrium: Right atrial size was mildly dilated.   Pericardium: There is no evidence of pericardial effusion.   Mitral Valve: Thickened rheumatic valve with severe central MR ERO 0.52  cm2 RV 101 cc Mean gradient across valve 5 peak 13 mmHg at HR 65 bpm. The  mitral valve is rheumatic. There is moderate thickening of the mitral  valve leaflet(s). There is moderate  calcification of the mitral valve leaflet(s). Mild mitral annular  calcification. Severe mitral valve regurgitation. Mild mitral valve  stenosis. MV peak gradient, 13.4 mmHg. The mean mitral valve gradient is  5.0 mmHg.   Tricuspid Valve: The tricuspid valve is normal in structure. Tricuspid  valve regurgitation is moderate.   Aortic Valve: The aortic valve is tricuspid. There is moderate  calcification of the aortic valve. There is moderate thickening of the  aortic valve. Aortic valve regurgitation is mild. Aortic valve sclerosis  is present, with no evidence of aortic valve  stenosis. Aortic valve mean gradient measures 5.0 mmHg. Aortic valve peak  gradient measures 9.2 mmHg.   Pulmonic Valve: The pulmonic valve was normal in structure. Pulmonic valve  regurgitation is trivial.   Aorta: The aortic root is normal in size and structure.   IAS/Shunts: No atrial level shunt detected by color flow Doppler.   Additional Comments: Spectral Doppler performed.   AORTIC VALVE  AV Vmax:      152.00 cm/s  AV Vmean:     103.000 cm/s  AV VTI:       0.389 m  AV Peak Grad: 9.2 mmHg  AV Mean Grad: 5.0 mmHg   MITRAL VALVE  MV Peak grad: 13.4 mmHg  MV Mean grad: 5.0 mmHg  MV Vmax:      1.83 m/s  MV Vmean:     104.0 cm/s  MR Peak grad:    112.8 mmHg  MR Mean grad:    78.0 mmHg  MR Vmax:         531.00 cm/s  MR Vmean:        429.0 cm/s  MR  PISA:         9.05 cm  MR PISA Eff ROA: 52 mm  MR PISA Radius:  1.20 cm   Recent Radiology Findings:       Recent Lab Findings: Lab Results  Component Value Date   WBC  7.1 10/15/2022   HGB 12.4 10/15/2022   HCT 37.6 10/15/2022   PLT 308 10/15/2022   GLUCOSE 78 10/15/2022   CHOL 156 06/18/2022   TRIG 133 06/18/2022   HDL 47 06/18/2022   LDLCALC 82 06/18/2022   ALT 25 07/19/2022   AST 22 07/19/2022   NA 139 10/15/2022   K 4.4 10/15/2022   CL 104 10/15/2022   CREATININE 1.25 (H) 10/15/2022   BUN 24 10/15/2022   CO2 24 10/15/2022   TSH 3.144 07/19/2022   INR 0.9 01/22/2021   HGBA1C 5.2 10/27/2020      Assessment / Plan:     77 yo female with 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 We discussed that her only option would be redo MVR with possible MAZE depending on scar tissue but that it would carry about a 4% mortality. We discussed the goals, risks, and recovery and she would like to proceed after the first of the year. She will need a repeat R heart cath and PFTs and carotid duplex. She will need to be off eliquis for 5 days. Our office will call her for appt in January for surgery later.    I have spent 60 min in review of the records, viewing studies and in face to face with patient and in coordination of future care    Eugenio Hoes 12/16/2022 9:59 AM

## 2022-12-17 ENCOUNTER — Encounter: Payer: Self-pay | Admitting: Thoracic Surgery (Cardiothoracic Vascular Surgery)

## 2022-12-17 ENCOUNTER — Other Ambulatory Visit: Payer: Self-pay | Admitting: Thoracic Surgery (Cardiothoracic Vascular Surgery)

## 2022-12-17 ENCOUNTER — Ambulatory Visit: Payer: 59 | Admitting: Thoracic Surgery (Cardiothoracic Vascular Surgery)

## 2022-12-17 VITALS — BP 145/72 | HR 55 | Resp 20 | Ht 61.0 in | Wt 136.0 lb

## 2022-12-17 DIAGNOSIS — I340 Nonrheumatic mitral (valve) insufficiency: Secondary | ICD-10-CM | POA: Diagnosis not present

## 2022-12-17 NOTE — Patient Instructions (Signed)
Call Dr Trula Ore office for lasix recommendation Carotid duplex, R heart cath, PFTs Off eliquis for 5 days prior to surgery in January

## 2022-12-18 ENCOUNTER — Encounter: Payer: Self-pay | Admitting: *Deleted

## 2022-12-18 ENCOUNTER — Other Ambulatory Visit: Payer: Self-pay | Admitting: *Deleted

## 2022-12-18 DIAGNOSIS — I340 Nonrheumatic mitral (valve) insufficiency: Secondary | ICD-10-CM

## 2022-12-19 ENCOUNTER — Telehealth: Payer: Self-pay | Admitting: Cardiovascular Disease

## 2022-12-19 NOTE — Telephone Encounter (Signed)
Pt c/o medication issue:  1. Name of Medication:   furosemide (LASIX) 20 MG tablet    2. How are you currently taking this medication (dosage and times per day)?   Take one tablet (20 mg) by mouth every day but may take an extra one tablet (20 mg) as needed.    3. Are you having a reaction (difficulty breathing--STAT)? No  4. What is your medication issue? Pt would like a callback regarding whether she is able to increase to 2 tablets. Reason being is for continued swelling. Please advise

## 2022-12-19 NOTE — Telephone Encounter (Signed)
Called patient, LVM to call back to discuss swelling.  Left call back number.

## 2022-12-19 NOTE — Telephone Encounter (Signed)
Called patient, she states she was on 40 mg Lasix daily, but blood work back in August showed that she should decrease to 20 mg daily, and take extra as needed for swelling or weight gain.   Patient states she has been having swelling located in her ankles and legs, and wanted to know if she could increase to 2 tablets, advised that per her Lasix instructions she could take an extra tablet as needed, we discussed trying the extra today and tomorrow, and then seeing if the swelling improves. Patient aware if she has further questions or if swelling does not improve to call back.  Patient verbalized understanding.

## 2022-12-28 NOTE — Progress Notes (Signed)
Cardiology Office Note  Date:  01/08/2023   ID:  Penny Mccullough, DOB 01-27-1946, MRN [id number]  PCP:  Patient, No Pcp Per   Chief Complaint  Patient presents with   6 month follow up     Discuss right cardiac cath per Dr. Leafy Ro. Patient c/o bilateral LE edema. Medications reviewed by the patient verbally.     HPI:  Penny Mccullough is a 77 y.o. female with a hx of  CAD  remote CABG approximately 30 years ago (suspected to be a LIMA-LAD per history),  CVA,  HLD,  COPD /tobacco /chronic bronchitis Ejection fraction 45 to 50% Moderate MR, severely dilated left atrium NSTEMI s/p PCI to ostial and mid LAD 10/28/20  Hard of hearing Atrial fibrillation in December 2022 cardioversion Mar 16, 2021, normal sinus rhythm restored Who presents for follow-up of her coronary disease,  PCI to ostial and mid LAD, atrial fibrillation (diagnosed December 2022)  Last seen by myself in clinic May 2024. Followed by advanced heart failure clinic Felt not to be a candidate for MitraClip  Seen by CT surgery, Dr. Leafy Ro   for Severe MR/Afib  Evaluated for MVR/possible MAZE, tentatively scheduled on Wednesday 03/06/23.  CT surgery requesting a repeat RHC prior to that.   Recent imaging reviewed Echo January 2024, EF 45 to 50%, severe MR  On further discussion today, she is having hesitation on fixing valve "Already had open heart surgery" On further reflection, she is willing to proceed given worsening lower extremity edema requiring increasing doses of diuretic  Denies tachypalpitations, no arrhythmia on amiodarone, metoprolol  EKG personally reviewed by myself on todays visit EKG Interpretation Date/Time:  Tuesday January 08 2023 15:17:32 EST Ventricular Rate:  46 PR Interval:  174 QRS Duration:  80 QT Interval:  524 QTC Calculation: 458 R Axis:   15  Text Interpretation: Sinus bradycardia Nonspecific T wave abnormality When compared with ECG of 15-Oct-2022 10:06, No significant change was  found Confirmed by [name] [phone]) on 01/08/2023 3:33:15 PM    History reviewed Last seen by myself in clinic February 06, 2021 Noted to be in atrial fibrillation at that time, rate 119 bpm, asymptomatic Entresto was held, started on low-dose digoxin, metoprolol succinate increased, Entresto held, Lasix 40 continued  September 2022 NSTEMI s/p PCI to ostial and mid LAD 10/28/20.   Echo showed EF 45-50% with HK of basal to mid inferior and inferolateral wall.   remote CABG x 1, at which time she was told "they took a main artery from my chest," and appears to be a LIMA to LAD.     PMH:   has a past medical history of CAD (coronary artery disease), CHF (congestive heart failure) (HCC), COPD (chronic obstructive pulmonary disease) (HCC), CVA (cerebral vascular accident) (HCC), HLD (hyperlipidemia), Hypertension, and MI (myocardial infarction) (HCC) (10/2020).  PSH:    Past Surgical History:  Procedure Laterality Date   CARDIOVERSION N/A 03/16/2021   Procedure: CARDIOVERSION;  Surgeon: Antonieta Iba, MD;  Location: [facility] ORS;  Service: Cardiovascular;  Laterality: N/A;   CORONARY ARTERY BYPASS GRAFT  1989   CORONARY STENT INTERVENTION N/A 10/28/2020   Procedure: CORONARY STENT INTERVENTION;  Surgeon: Alwyn Pea, MD;  Location: [facility] INVASIVE CV LAB;  Service: Cardiovascular;  Laterality: N/A;   LEFT HEART CATH AND CORONARY ANGIOGRAPHY N/A 10/28/2020   Procedure: LEFT HEART CATH AND CORONARY ANGIOGRAPHY;  Surgeon: Alwyn Pea, MD;  Location: [facility] INVASIVE CV LAB;  Service: Cardiovascular;  Laterality: N/A;  RIGHT/LEFT HEART CATH AND CORONARY ANGIOGRAPHY N/A 04/18/2022   Procedure: RIGHT/LEFT HEART CATH AND CORONARY ANGIOGRAPHY;  Surgeon: Orbie Pyo, MD;  Location: [facility] INVASIVE CV LAB;  Service: Cardiovascular;  Laterality: N/A;   TEE WITHOUT CARDIOVERSION N/A 05/01/2022   Procedure: TRANSESOPHAGEAL ECHOCARDIOGRAM (TEE);  Surgeon: Wendall Stade, MD;  Location: [facility]  ENDOSCOPY;  Service: Cardiovascular;  Laterality: N/A;   TEE WITHOUT CARDIOVERSION N/A 10/26/2022   Procedure: TRANSESOPHAGEAL ECHOCARDIOGRAM;  Surgeon: Wendall Stade, MD;  Location: [facility] INVASIVE CV LAB;  Service: Cardiovascular;  Laterality: N/A;    Current Outpatient Medications  Medication Sig Dispense Refill   acetaminophen (TYLENOL) 500 MG tablet Take 500-1,000 mg by mouth every 6 (six) hours as needed (pain.).     albuterol (VENTOLIN HFA) 108 (90 Base) MCG/ACT inhaler Inhale 2 puffs into the lungs every 6 (six) hours as needed for wheezing or shortness of breath. 8 g 0   amiodarone (PACERONE) 200 MG tablet Take 1 tablet (200 mg total) by mouth daily. Take 1 tablet daily 90 tablet 3   apixaban (ELIQUIS) 5 MG TABS tablet Take 1 tablet (5 mg total) by mouth 2 (two) times daily. 180 tablet 3   atorvastatin (LIPITOR) 80 MG tablet Take 1 tablet (80 mg total) by mouth daily. 90 tablet 3   empagliflozin (JARDIANCE) 10 MG TABS tablet TAKE 1 TABLET BY MOUTH EVERY DAY BEFORE BREAKFAST 30 tablet 11   ezetimibe (ZETIA) 10 MG tablet Take 1 tablet (10 mg total) by mouth daily. 90 tablet 3   furosemide (LASIX) 20 MG tablet Take one tablet (20 mg) by mouth every day but may take an extra one tablet (20 mg) as needed.     metoprolol succinate (TOPROL-XL) 25 MG 24 hr tablet Take 0.5 tablet (12.5 mg) by mouth once daily 45 tablet 3   nitroGLYCERIN (NITROSTAT) 0.4 MG SL tablet Place 1 tablet (0.4 mg total) under the tongue every 5 (five) minutes as needed for chest pain. 25 tablet 2   sacubitril-valsartan (ENTRESTO) 24-26 MG Take 1 tablet by mouth 2 (two) times daily. 90 tablet 7   spironolactone (ALDACTONE) 25 MG tablet Take 0.5 tablets (12.5 mg total) by mouth daily. 45 tablet 3   No current facility-administered medications for this visit.    Allergies:   Codeine   Social History:  The patient  reports that she quit smoking about 35 years ago. Her smoking use included cigarettes. She started smoking  about 65 years ago. She has a 30 pack-year smoking history. She has never used smokeless tobacco. She reports that she does not currently use alcohol. She reports that she does not use drugs.   Family History:   family history includes Hypertension in her mother.    Review of Systems: Review of Systems  Constitutional: Negative.   HENT: Negative.    Respiratory: Negative.    Cardiovascular: Negative.   Gastrointestinal: Negative.   Musculoskeletal: Negative.   Neurological: Negative.   Psychiatric/Behavioral: Negative.    All other systems reviewed and are negative.  2-minute PHYSICAL EXAM: VS:  BP 100/60 (BP Location: Left Arm, Patient Position: Sitting, Cuff Size: Normal)   Pulse (!) 46   Ht 5\' 1"  (1.549 m)   Wt 135 lb (61.2 kg)   SpO2 96%   BMI 25.51 kg/m  , BMI Body mass index is 25.51 kg/m. Constitutional:  oriented to person, place, and time. No distress.  HENT:  Head: Grossly normal Eyes:  no discharge. No scleral icterus.  Neck:  No JVD, no carotid bruits  Cardiovascular: Regular rate and rhythm, 2/6 systolic ejection murmur left sternal border Pulmonary/Chest: Clear to auscultation bilaterally, no wheezes or rails Abdominal: Soft.  no distension.  no tenderness.  Musculoskeletal: Normal range of motion Neurological:  normal muscle tone. Coordination normal. No atrophy Skin: Skin warm and dry Psychiatric: normal affect, pleasant   Recent Labs: 07/19/2022: ALT 25; B Natriuretic Peptide 256.9; TSH 3.144 08/15/2022: NT-Pro BNP 435 10/15/2022: BUN 24; Creatinine, Ser 1.25; Hemoglobin 12.4; Platelets 308; Potassium 4.4; Sodium 139    Lipid Panel Lab Results  Component Value Date   CHOL 156 06/18/2022   HDL 47 06/18/2022   LDLCALC 82 06/18/2022   TRIG 133 06/18/2022      Wt Readings from Last 3 Encounters:  01/08/23 135 lb (61.2 kg)  12/17/22 136 lb (61.7 kg)  10/26/22 134 lb 0.6 oz (60.8 kg)     ASSESSMENT AND PLAN:  Problem List Items Addressed This  Visit       Cardiology Problems   CAD (coronary artery disease)     Other   COPD (chronic obstructive pulmonary disease) (HCC)   Other Visit Diagnoses     Paroxysmal atrial fibrillation (HCC)    -  Primary   Relevant Orders   EKG 12-Lead (Completed)   Severe mitral valve regurgitation       Relevant Orders   EKG 12-Lead (Completed)   Chronic systolic heart failure (HCC)       Relevant Orders   EKG 12-Lead (Completed)   Essential hypertension       Relevant Orders   EKG 12-Lead (Completed)   Ischemic cardiomyopathy       Relevant Orders   EKG 12-Lead (Completed)   Irregular heart rate       Relevant Orders   EKG 12-Lead (Completed)   Hypotension, unspecified hypotension type       Relevant Orders   EKG 12-Lead (Completed)      Coronary disease with stable angina Prior PCI to ostial and mid LAD Denies anginal symptoms On statin, goal LDL less than 70 Nonobstructive disease on recent heart catheterization February 2024  Atrial fibrillation with RVR December 2022 Underwent cardioversion January 2023, recurrent A-fib August 2023 Started on amiodarone November 2023 converting to normal sinus rhythm Maintaining normal sinus rhythm on amiodarone, metoprolol Tolerating Eliquis 5 twice daily  Severe mitral valve regurgitation Ejection fraction 45 to 50% with severe MR Has been seen by CT surgery, tentative surgery January 2025 As requested by CT surgery, will schedule for repeat right heart catheterization to provide updated numbers prior to surgery She would like to schedule this beginning of December, following holidays  Lots of questions today, discussion concerning procedure,   Mild hard of hearing   Signed, Dossie Arbour, M.D., Ph.D. [facility] [place], [place] [phone]

## 2022-12-28 NOTE — H&P (View-Only) (Signed)
 Cardiology Office Note  Date:  01/08/2023   ID:  Penny Mccullough, DOB 01-27-1946, MRN [id number]  PCP:  Patient, No Pcp Per   Chief Complaint  Patient presents with   6 month follow up     Discuss right cardiac cath per Dr. Leafy Ro. Patient c/o bilateral LE edema. Medications reviewed by the patient verbally.     HPI:  Penny Mccullough is a 77 y.o. female with a hx of  CAD  remote CABG approximately 30 years ago (suspected to be a LIMA-LAD per history),  CVA,  HLD,  COPD /tobacco /chronic bronchitis Ejection fraction 45 to 50% Moderate MR, severely dilated left atrium NSTEMI s/p PCI to ostial and mid LAD 10/28/20  Hard of hearing Atrial fibrillation in December 2022 cardioversion Mar 16, 2021, normal sinus rhythm restored Who presents for follow-up of her coronary disease,  PCI to ostial and mid LAD, atrial fibrillation (diagnosed December 2022)  Last seen by myself in clinic May 2024. Followed by advanced heart failure clinic Felt not to be a candidate for MitraClip  Seen by CT surgery, Dr. Leafy Ro   for Severe MR/Afib  Evaluated for MVR/possible MAZE, tentatively scheduled on Wednesday 03/06/23.  CT surgery requesting a repeat RHC prior to that.   Recent imaging reviewed Echo January 2024, EF 45 to 50%, severe MR  On further discussion today, she is having hesitation on fixing valve "Already had open heart surgery" On further reflection, she is willing to proceed given worsening lower extremity edema requiring increasing doses of diuretic  Denies tachypalpitations, no arrhythmia on amiodarone, metoprolol  EKG personally reviewed by myself on todays visit EKG Interpretation Date/Time:  Tuesday January 08 2023 15:17:32 EST Ventricular Rate:  46 PR Interval:  174 QRS Duration:  80 QT Interval:  524 QTC Calculation: 458 R Axis:   15  Text Interpretation: Sinus bradycardia Nonspecific T wave abnormality When compared with ECG of 15-Oct-2022 10:06, No significant change was  found Confirmed by [name] [phone]) on 01/08/2023 3:33:15 PM    History reviewed Last seen by myself in clinic February 06, 2021 Noted to be in atrial fibrillation at that time, rate 119 bpm, asymptomatic Entresto was held, started on low-dose digoxin, metoprolol succinate increased, Entresto held, Lasix 40 continued  September 2022 NSTEMI s/p PCI to ostial and mid LAD 10/28/20.   Echo showed EF 45-50% with HK of basal to mid inferior and inferolateral wall.   remote CABG x 1, at which time she was told "they took a main artery from my chest," and appears to be a LIMA to LAD.     PMH:   has a past medical history of CAD (coronary artery disease), CHF (congestive heart failure) (HCC), COPD (chronic obstructive pulmonary disease) (HCC), CVA (cerebral vascular accident) (HCC), HLD (hyperlipidemia), Hypertension, and MI (myocardial infarction) (HCC) (10/2020).  PSH:    Past Surgical History:  Procedure Laterality Date   CARDIOVERSION N/A 03/16/2021   Procedure: CARDIOVERSION;  Surgeon: Antonieta Iba, MD;  Location: [facility] ORS;  Service: Cardiovascular;  Laterality: N/A;   CORONARY ARTERY BYPASS GRAFT  1989   CORONARY STENT INTERVENTION N/A 10/28/2020   Procedure: CORONARY STENT INTERVENTION;  Surgeon: Alwyn Pea, MD;  Location: [facility] INVASIVE CV LAB;  Service: Cardiovascular;  Laterality: N/A;   LEFT HEART CATH AND CORONARY ANGIOGRAPHY N/A 10/28/2020   Procedure: LEFT HEART CATH AND CORONARY ANGIOGRAPHY;  Surgeon: Alwyn Pea, MD;  Location: [facility] INVASIVE CV LAB;  Service: Cardiovascular;  Laterality: N/A;  RIGHT/LEFT HEART CATH AND CORONARY ANGIOGRAPHY N/A 04/18/2022   Procedure: RIGHT/LEFT HEART CATH AND CORONARY ANGIOGRAPHY;  Surgeon: Orbie Pyo, MD;  Location: [facility] INVASIVE CV LAB;  Service: Cardiovascular;  Laterality: N/A;   TEE WITHOUT CARDIOVERSION N/A 05/01/2022   Procedure: TRANSESOPHAGEAL ECHOCARDIOGRAM (TEE);  Surgeon: Wendall Stade, MD;  Location: [facility]  ENDOSCOPY;  Service: Cardiovascular;  Laterality: N/A;   TEE WITHOUT CARDIOVERSION N/A 10/26/2022   Procedure: TRANSESOPHAGEAL ECHOCARDIOGRAM;  Surgeon: Wendall Stade, MD;  Location: [facility] INVASIVE CV LAB;  Service: Cardiovascular;  Laterality: N/A;    Current Outpatient Medications  Medication Sig Dispense Refill   acetaminophen (TYLENOL) 500 MG tablet Take 500-1,000 mg by mouth every 6 (six) hours as needed (pain.).     albuterol (VENTOLIN HFA) 108 (90 Base) MCG/ACT inhaler Inhale 2 puffs into the lungs every 6 (six) hours as needed for wheezing or shortness of breath. 8 g 0   amiodarone (PACERONE) 200 MG tablet Take 1 tablet (200 mg total) by mouth daily. Take 1 tablet daily 90 tablet 3   apixaban (ELIQUIS) 5 MG TABS tablet Take 1 tablet (5 mg total) by mouth 2 (two) times daily. 180 tablet 3   atorvastatin (LIPITOR) 80 MG tablet Take 1 tablet (80 mg total) by mouth daily. 90 tablet 3   empagliflozin (JARDIANCE) 10 MG TABS tablet TAKE 1 TABLET BY MOUTH EVERY DAY BEFORE BREAKFAST 30 tablet 11   ezetimibe (ZETIA) 10 MG tablet Take 1 tablet (10 mg total) by mouth daily. 90 tablet 3   furosemide (LASIX) 20 MG tablet Take one tablet (20 mg) by mouth every day but may take an extra one tablet (20 mg) as needed.     metoprolol succinate (TOPROL-XL) 25 MG 24 hr tablet Take 0.5 tablet (12.5 mg) by mouth once daily 45 tablet 3   nitroGLYCERIN (NITROSTAT) 0.4 MG SL tablet Place 1 tablet (0.4 mg total) under the tongue every 5 (five) minutes as needed for chest pain. 25 tablet 2   sacubitril-valsartan (ENTRESTO) 24-26 MG Take 1 tablet by mouth 2 (two) times daily. 90 tablet 7   spironolactone (ALDACTONE) 25 MG tablet Take 0.5 tablets (12.5 mg total) by mouth daily. 45 tablet 3   No current facility-administered medications for this visit.    Allergies:   Codeine   Social History:  The patient  reports that she quit smoking about 35 years ago. Her smoking use included cigarettes. She started smoking  about 65 years ago. She has a 30 pack-year smoking history. She has never used smokeless tobacco. She reports that she does not currently use alcohol. She reports that she does not use drugs.   Family History:   family history includes Hypertension in her mother.    Review of Systems: Review of Systems  Constitutional: Negative.   HENT: Negative.    Respiratory: Negative.    Cardiovascular: Negative.   Gastrointestinal: Negative.   Musculoskeletal: Negative.   Neurological: Negative.   Psychiatric/Behavioral: Negative.    All other systems reviewed and are negative.  2-minute PHYSICAL EXAM: VS:  BP 100/60 (BP Location: Left Arm, Patient Position: Sitting, Cuff Size: Normal)   Pulse (!) 46   Ht 5\' 1"  (1.549 m)   Wt 135 lb (61.2 kg)   SpO2 96%   BMI 25.51 kg/m  , BMI Body mass index is 25.51 kg/m. Constitutional:  oriented to person, place, and time. No distress.  HENT:  Head: Grossly normal Eyes:  no discharge. No scleral icterus.  Neck:  No JVD, no carotid bruits  Cardiovascular: Regular rate and rhythm, 2/6 systolic ejection murmur left sternal border Pulmonary/Chest: Clear to auscultation bilaterally, no wheezes or rails Abdominal: Soft.  no distension.  no tenderness.  Musculoskeletal: Normal range of motion Neurological:  normal muscle tone. Coordination normal. No atrophy Skin: Skin warm and dry Psychiatric: normal affect, pleasant   Recent Labs: 07/19/2022: ALT 25; B Natriuretic Peptide 256.9; TSH 3.144 08/15/2022: NT-Pro BNP 435 10/15/2022: BUN 24; Creatinine, Ser 1.25; Hemoglobin 12.4; Platelets 308; Potassium 4.4; Sodium 139    Lipid Panel Lab Results  Component Value Date   CHOL 156 06/18/2022   HDL 47 06/18/2022   LDLCALC 82 06/18/2022   TRIG 133 06/18/2022      Wt Readings from Last 3 Encounters:  01/08/23 135 lb (61.2 kg)  12/17/22 136 lb (61.7 kg)  10/26/22 134 lb 0.6 oz (60.8 kg)     ASSESSMENT AND PLAN:  Problem List Items Addressed This  Visit       Cardiology Problems   CAD (coronary artery disease)     Other   COPD (chronic obstructive pulmonary disease) (HCC)   Other Visit Diagnoses     Paroxysmal atrial fibrillation (HCC)    -  Primary   Relevant Orders   EKG 12-Lead (Completed)   Severe mitral valve regurgitation       Relevant Orders   EKG 12-Lead (Completed)   Chronic systolic heart failure (HCC)       Relevant Orders   EKG 12-Lead (Completed)   Essential hypertension       Relevant Orders   EKG 12-Lead (Completed)   Ischemic cardiomyopathy       Relevant Orders   EKG 12-Lead (Completed)   Irregular heart rate       Relevant Orders   EKG 12-Lead (Completed)   Hypotension, unspecified hypotension type       Relevant Orders   EKG 12-Lead (Completed)      Coronary disease with stable angina Prior PCI to ostial and mid LAD Denies anginal symptoms On statin, goal LDL less than 70 Nonobstructive disease on recent heart catheterization February 2024  Atrial fibrillation with RVR December 2022 Underwent cardioversion January 2023, recurrent A-fib August 2023 Started on amiodarone November 2023 converting to normal sinus rhythm Maintaining normal sinus rhythm on amiodarone, metoprolol Tolerating Eliquis 5 twice daily  Severe mitral valve regurgitation Ejection fraction 45 to 50% with severe MR Has been seen by CT surgery, tentative surgery January 2025 As requested by CT surgery, will schedule for repeat right heart catheterization to provide updated numbers prior to surgery She would like to schedule this beginning of December, following holidays  Lots of questions today, discussion concerning procedure,   Mild hard of hearing   Signed, Dossie Arbour, M.D., Ph.D. [facility] [place], [place] [phone]

## 2023-01-08 ENCOUNTER — Ambulatory Visit: Payer: 59 | Attending: Cardiovascular Disease | Admitting: Cardiovascular Disease

## 2023-01-08 ENCOUNTER — Encounter: Payer: Self-pay | Admitting: Cardiovascular Disease

## 2023-01-08 VITALS — BP 100/60 | HR 46 | Ht 61.0 in | Wt 135.0 lb

## 2023-01-08 DIAGNOSIS — I499 Cardiac arrhythmia, unspecified: Secondary | ICD-10-CM

## 2023-01-08 DIAGNOSIS — I25118 Atherosclerotic heart disease of native coronary artery with other forms of angina pectoris: Secondary | ICD-10-CM

## 2023-01-08 DIAGNOSIS — Z79899 Other long term (current) drug therapy: Secondary | ICD-10-CM

## 2023-01-08 DIAGNOSIS — I480 Paroxysmal atrial fibrillation: Secondary | ICD-10-CM

## 2023-01-08 DIAGNOSIS — I255 Ischemic cardiomyopathy: Secondary | ICD-10-CM

## 2023-01-08 DIAGNOSIS — I5022 Chronic systolic (congestive) heart failure: Secondary | ICD-10-CM | POA: Diagnosis not present

## 2023-01-08 DIAGNOSIS — I340 Nonrheumatic mitral (valve) insufficiency: Secondary | ICD-10-CM | POA: Diagnosis not present

## 2023-01-08 DIAGNOSIS — I10 Essential (primary) hypertension: Secondary | ICD-10-CM

## 2023-01-08 DIAGNOSIS — J432 Centrilobular emphysema: Secondary | ICD-10-CM

## 2023-01-08 DIAGNOSIS — J449 Chronic obstructive pulmonary disease, unspecified: Secondary | ICD-10-CM

## 2023-01-08 DIAGNOSIS — I959 Hypotension, unspecified: Secondary | ICD-10-CM

## 2023-01-08 NOTE — Patient Instructions (Addendum)
Medication Instructions:  No changes  If you need a refill on your cardiac medications before your next appointment, please call your pharmacy.   Lab work: Your provider would like for you to have following labs drawn today CBC and BMP.    Testing/Procedures:  [place] [facility] DEPT OF [facility] [place] [address] [place] [address] Dept: [phone] Loc: [phone]  Penny Mccullough  01/08/2023  You are scheduled for a Cardiac Catheterization on Tuesday, December 3 with Dr. Cristal De[place] End.  1. Please arrive at the [facility] Entrance of [facility], [address], [place] [address] at 10:00 AM (This is 1 hour(s) prior to your procedure time).  Proceed to the Check-In Desk directly inside the entrance.  Procedure Parking: Use the entrance off of the [address] side of the hospital. Turn right upon entering and follow the driveway to parking that is directly in front of the [facility] Center. There is no valet parking available at this entrance, however there is an awning directly in front of the [facility] Vascular Center for drop off/ pick up for patients.  Special note: Every effort is made to have your procedure done on time. Please understand that emergencies sometimes delay scheduled procedures.  2. Diet: Do not eat solid foods after midnight.  The patient may have clear liquids until 5am upon the day of the procedure.  4. Medication instructions in preparation for your procedure:   Contrast Allergy: No  Stop taking Eliquis (Apixiban) on Sunday, December 1.  Hold Jardiance 10 MG and Spironolactone 12.5 MG the morning of procedure.   5. Plan to go home the same day, you will only stay overnight if medically necessary. 6. Bring a current list of your medications and current insurance cards. 7. You MUST have a responsible person to drive you home. 8. Someone MUST be with  you the first 24 hours after you arrive home or your discharge will be delayed. 9. Please wear clothes that are easy to get on and off and wear slip-on shoes.  Thank you for allowing [place] to care for you!   -- [facility]    Follow-Up: At [facility], you and your health needs are our priority.  As part of our continuing mission to provide you with exceptional heart care, we have created designated Provider Care Teams.  These Care Teams include your primary Cardiologist (physician) and Advanced Practice Providers (APPs -  Physician Assistants and Nurse Practitioners) who all work together to provide you with the care you need, when you need it.  You will need a follow up appointment in 6 months  Providers on your designated Care Team:   Nicolasa Ducking, NP Eula Listen, PA-C Cadence Fransico [place], [place]  COVID-19 Vaccine Information can be found at: [email or web address] For questions related to vaccine distribution or appointments, please email [email or web address] or call [phone].

## 2023-01-17 LAB — BASIC METABOLIC PANEL
BUN/Creatinine Ratio: 12 (ref 12–28)
BUN: 14 mg/dL (ref 8–27)
CO2: 23 mmol/L (ref 20–29)
Calcium: 9.3 mg/dL (ref 8.7–10.3)
Chloride: 106 mmol/L (ref 96–106)
Creatinine, Ser: 1.2 mg/dL — ABNORMAL HIGH (ref 0.57–1.00)
Glucose: 120 mg/dL — ABNORMAL HIGH (ref 70–99)
Potassium: 5.2 mmol/L (ref 3.5–5.2)
Sodium: 144 mmol/L (ref 134–144)
eGFR: 47 mL/min/{1.73_m2} — ABNORMAL LOW (ref >59)

## 2023-01-17 LAB — CBC
Hematocrit: 43 % (ref 34.0–46.6)
Hemoglobin: 13.9 g/dL (ref 11.1–15.9)
MCH: 31.6 pg (ref 26.6–33.0)
MCHC: 32.3 g/dL (ref 31.5–35.7)
MCV: 98 fL — ABNORMAL HIGH (ref 79–97)
Platelets: 324 10*3/uL (ref 150–450)
RBC: 4.4 x10E6/uL (ref 3.77–5.28)
RDW: 12 % (ref 11.7–15.4)
WBC: 6.3 10*3/uL (ref 3.4–10.8)

## 2023-01-21 ENCOUNTER — Telehealth: Payer: Self-pay | Admitting: Cardiovascular Disease

## 2023-01-21 NOTE — Telephone Encounter (Signed)
Pt said, to call her at [phone]

## 2023-01-21 NOTE — Telephone Encounter (Signed)
?  Pt is returning call to get result ?

## 2023-01-21 NOTE — Telephone Encounter (Signed)
Called patient and left message for call back.

## 2023-01-22 ENCOUNTER — Encounter: Payer: Self-pay | Admitting: Internal Medicine

## 2023-01-22 ENCOUNTER — Ambulatory Visit
Admission: RE | Admit: 2023-01-22 | Discharge: 2023-01-22 | Disposition: A | Discharge status: Home / Self Care | Payer: 59 | Attending: Internal Medicine | Admitting: Internal Medicine

## 2023-01-22 ENCOUNTER — Encounter
Admission: RE | Disposition: A | Discharge status: Home / Self Care | Payer: Self-pay | Source: Home / Self Care | Attending: Internal Medicine

## 2023-01-22 ENCOUNTER — Other Ambulatory Visit: Payer: Self-pay

## 2023-01-22 DIAGNOSIS — Z955 Presence of coronary angioplasty implant and graft: Secondary | ICD-10-CM | POA: Diagnosis not present

## 2023-01-22 DIAGNOSIS — Z7901 Long term (current) use of anticoagulants: Secondary | ICD-10-CM | POA: Diagnosis not present

## 2023-01-22 DIAGNOSIS — I2510 Atherosclerotic heart disease of native coronary artery without angina pectoris: Secondary | ICD-10-CM | POA: Insufficient documentation

## 2023-01-22 DIAGNOSIS — I5022 Chronic systolic (congestive) heart failure: Secondary | ICD-10-CM | POA: Diagnosis not present

## 2023-01-22 DIAGNOSIS — J449 Chronic obstructive pulmonary disease, unspecified: Secondary | ICD-10-CM | POA: Insufficient documentation

## 2023-01-22 DIAGNOSIS — I252 Old myocardial infarction: Secondary | ICD-10-CM | POA: Insufficient documentation

## 2023-01-22 DIAGNOSIS — Z87891 Personal history of nicotine dependence: Secondary | ICD-10-CM | POA: Diagnosis not present

## 2023-01-22 DIAGNOSIS — I340 Nonrheumatic mitral (valve) insufficiency: Secondary | ICD-10-CM | POA: Insufficient documentation

## 2023-01-22 DIAGNOSIS — I110 Hypertensive heart disease with heart failure: Secondary | ICD-10-CM | POA: Insufficient documentation

## 2023-01-22 DIAGNOSIS — Z8249 Family history of ischemic heart disease and other diseases of the circulatory system: Secondary | ICD-10-CM | POA: Diagnosis not present

## 2023-01-22 DIAGNOSIS — Z951 Presence of aortocoronary bypass graft: Secondary | ICD-10-CM | POA: Diagnosis not present

## 2023-01-22 DIAGNOSIS — I480 Paroxysmal atrial fibrillation: Secondary | ICD-10-CM | POA: Insufficient documentation

## 2023-01-22 DIAGNOSIS — I272 Pulmonary hypertension, unspecified: Secondary | ICD-10-CM | POA: Insufficient documentation

## 2023-01-22 HISTORY — PX: RIGHT HEART CATH: CATH118263

## 2023-01-22 LAB — POCT I-STAT EG7
Acid-base deficit: 1 mmol/L (ref 0.0–2.0)
Bicarbonate: 24.6 mmol/L (ref 20.0–28.0)
Calcium, Ion: 1.17 mmol/L (ref 1.15–1.40)
HCT: 37 % (ref 36.0–46.0)
Hemoglobin: 12.6 g/dL (ref 12.0–15.0)
O2 Saturation: 62 %
Potassium: 3.9 mmol/L (ref 3.5–5.1)
Sodium: 144 mmol/L (ref 135–145)
TCO2: 26 mmol/L (ref 22–32)
pCO2, Ven: 42.5 mm[Hg] — ABNORMAL LOW (ref 44–60)
pH, Ven: 7.371 (ref 7.25–7.43)
pO2, Ven: 33 mm[Hg] (ref 32–45)

## 2023-01-22 SURGERY — RIGHT HEART CATH
Anesthesia: Moderate Sedation | Laterality: Right

## 2023-01-22 MED ORDER — HYDRALAZINE HCL 20 MG/ML IJ SOLN: 10.0000 mg | INTRAMUSCULAR | Status: DC | PRN

## 2023-01-22 MED ORDER — SODIUM CHLORIDE 0.9% FLUSH: 3.0000 mL | Freq: Two times a day (BID) | INTRAVENOUS | Status: DC

## 2023-01-22 MED ORDER — LIDOCAINE HCL 1 % IJ SOLN
INTRAMUSCULAR | Status: DC | PRN
  Administered 2023-01-22: 1 mL

## 2023-01-22 MED ORDER — FUROSEMIDE 40 MG PO TABS: 40.0000 mg | ORAL_TABLET | Freq: Every day | ORAL | 5 refills | Status: DC

## 2023-01-22 MED ORDER — SODIUM CHLORIDE 0.9% FLUSH: 3.0000 mL | INTRAVENOUS | Status: DC | PRN

## 2023-01-22 MED ORDER — HEPARIN (PORCINE) IN NACL 1000-0.9 UT/500ML-% IV SOLN
INTRAVENOUS | Status: DC | PRN
  Administered 2023-01-22: 1000 mL

## 2023-01-22 MED ORDER — SODIUM CHLORIDE 0.9 % IV SOLN: 250.0000 mL | INTRAVENOUS | Status: DC | PRN

## 2023-01-22 MED ORDER — SODIUM CHLORIDE 0.9% FLUSH: 10.0000 mL | Freq: Two times a day (BID) | INTRAVENOUS | Status: DC

## 2023-01-22 SURGICAL SUPPLY — 7 items
CATH BALLN WEDGE 5F 110CM (CATHETERS) IMPLANT
DRAPE BRACHIAL (DRAPES) IMPLANT
PACK CARDIAC CATH (CUSTOM PROCEDURE TRAY) ×1 IMPLANT
PROTECTION STATION PRESSURIZED (MISCELLANEOUS) ×1
SET ATX-X65L (MISCELLANEOUS) IMPLANT
SHEATH GLIDE SLENDER 4/5FR (SHEATH) IMPLANT
STATION PROTECTION PRESSURIZED (MISCELLANEOUS) IMPLANT

## 2023-01-22 NOTE — Discharge Instructions (Signed)
Right Heart Cath, Care After This sheet gives you information about how to care for yourself after your procedure. Your health care provider may also give you more specific instructions. If you have problems or questions, contact your health care provider. What can I expect after the procedure? After the procedure, it is common to have: Bruising or mild discomfort in the area where the IV was inserted (insertion site). Follow these instructions at home: Eating and drinking  You may eat and drink after your procedure.  Drink a lot of fluids for the first several days after the procedure, as directed by your health care provider. This helps to wash (flush) the contrast out of your body. Examples of healthy fluids include water or low-calorie drinks. General instructions Check your IV insertion area and also your venous access site every day for signs of infection. Check for: Redness, swelling, or pain. Fluid or blood. Warmth. Pus or a bad smell. Take over-the-counter and prescription medicines only as told by your health care provider. Rest and return to your normal activities as told by your health care provider. Ask your health care provider what activities are safe for you. Do not drive for 24 hours if you were given a medicine to help you relax (sedative), or until your health care provider approves. Keep all follow-up visits as told by your health care provider. This is important. Contact a health care provider if: Your skin becomes itchy or you develop a rash or hives. You have a fever that does not get better with medicine. You feel nauseous. You vomit. You have redness, swelling, or pain around the insertion site. You have fluid or blood coming from the insertion site. Your insertion area feels warm to the touch. You have pus or a bad smell coming from the insertion site. Get help right away if: You have difficulty breathing or shortness of breath. You develop chest pain. You  faint. You feel very dizzy. These symptoms may represent a serious problem that is an emergency. Do not wait to see if the symptoms will go away. Get medical help right away. Call your local emergency services (911 in the [place] Do not drive yourself to the hospital. Summary After your procedure, it is common to have bruising or mild discomfort in the area where the IV was inserted. You should check your IV insertion area every day for signs of infection. Take over-the-counter and prescription medicines only as told by your health care provider. You should drink a lot of fluids for the first several days after the procedure to help flush the contrast from your body. This information is not intended to replace advice given to you by your health care provider. Make sure you discuss any questions you have with your health care provider. Document Released: 11/26/2012 Document Revised: 01/18/2017 Document Reviewed: 12/31/2015 Elsevier Patient Education  2020 [employer] 

## 2023-01-22 NOTE — Telephone Encounter (Signed)
See result note.  

## 2023-01-22 NOTE — Interval H&P Note (Signed)
History and Physical Interval Note:  01/22/2023 11:13 AM  Penny Mccullough  has presented today for surgery, with the diagnosis of mitral valve regurgitation and pulmonary hypertension.  The various methods of treatment have been discussed with the patient and family. After consideration of risks, benefits and other options for treatment, the patient has consented to  Procedure(s): RIGHT HEART CATH (Right) as a surgical intervention.  The patient's history has been reviewed, patient examined, no change in status, stable for surgery.  I have reviewed the patient's chart and labs.  Questions were answered to the patient's satisfaction.     Dallin Mccorkel

## 2023-01-28 ENCOUNTER — Telehealth: Payer: Self-pay | Admitting: Emergency Medicine

## 2023-01-28 NOTE — Telephone Encounter (Signed)
Called patient no answer and unable to leave message.   Potassium level running little bit low when she had cardiac catheterization  Would restart spironolactone 12.5 mg daily  This had been held week prior for high potassium  (Unclear if her elevated potassium was from hemolysis at the time of lab work in our office)  Penny Mccullough

## 2023-01-28 NOTE — Telephone Encounter (Signed)
Spoke with patient and notified her of the following.   Potassium level running little bit low when she had cardiac catheterization  Would restart spironolactone 12.5 mg daily  This had been held week prior for high potassium  (Unclear if her elevated potassium was from hemolysis at the time of lab work in our office)  Laretta Alstrom   Patient verbalizes understanding.

## 2023-02-04 NOTE — Progress Notes (Signed)
Cardiology Office Note  Date:  02/05/2023   ID:  Penny Mccullough, DOB August 13, 1945, MRN [id number]  PCP:  Patient, No Pcp Per   Chief Complaint  Patient presents with   Follow up cardiac cath    "Doing well."     HPI:  Penny Mccullough is a 77 y.o. female with a hx of  CAD  remote CABG approximately 30 years ago (suspected to be a LIMA-LAD per history),  CVA,  HLD,  COPD /tobacco /chronic bronchitis Ejection fraction 45 to 50% Moderate MR, severely dilated left atrium NSTEMI s/p PCI to ostial and mid LAD 10/28/20  Hard of hearing Atrial fibrillation in December 2022 cardioversion Mar 16, 2021, normal sinus rhythm restored Who presents for follow-up of her coronary disease,  PCI to ostial and mid LAD, atrial fibrillation (diagnosed December 2022)  Last seen by myself in clinic November 2024. Followed by advanced heart failure clinic Felt not to be a candidate for MitraClip  Scheduled for mitral valve surgery January 2025  Dr. Leafy Ro   for Severe MR/Afib, MVR possible maze  Right heart catheterization Mildly elevated left and right heart filling pressures (mean RA 7 mmHg, PCWP 23 mmHg). Moderate pulmonary hypertension (PA 68/23, mean 38 mmHg, PVR 3.8 WU). Low normal Fick cardiac output/index (CO 3.9 L/min, CI 2.4 L/min/m^2).  Lasix was increased up to 40 mg daily  In follow-up today she reports that she feels well, denies significant shortness of breath or chest discomfort No knee orthopnea, no significant leg swelling She has remained on spironolactone 12.5 daily Lasix 40 daily  Asymptomatic sinus bradycardia rates in the high 40s to 50 on amiodarone 200 daily metoprolol succinate 12.5 daily Denies any tachypalpitations concerning for recurrent arrhythmia  EKG personally reviewed by myself on todays visit EKG Interpretation Date/Time:  Tuesday February 05 2023 11:05:03 EST Ventricular Rate:  48 PR Interval:  152 QRS Duration:  86 QT Interval:  524 QTC Calculation: 468 R  Axis:   17  Text Interpretation: Sinus bradycardia When compared with ECG of 05-Feb-2023 11:04, No significant change was found Confirmed by [name] [phone]) on 02/05/2023 11:07:00 AM   History reviewed Last seen by myself in clinic February 06, 2021 Noted to be in atrial fibrillation at that time, rate 119 bpm, asymptomatic Entresto was held, started on low-dose digoxin, metoprolol succinate increased, Entresto held, Lasix 40 continued  September 2022 NSTEMI s/p PCI to ostial and mid LAD 10/28/20.   Echo showed EF 45-50% with HK of basal to mid inferior and inferolateral wall.   remote CABG x 1, at which time she was told "they took a main artery from my chest," and appears to be a LIMA to LAD.     PMH:   has a past medical history of CAD (coronary artery disease), CHF (congestive heart failure) (HCC), COPD (chronic obstructive pulmonary disease) (HCC), CVA (cerebral vascular accident) (HCC), HLD (hyperlipidemia), Hypertension, and MI (myocardial infarction) (HCC) (10/2020).  PSH:    Past Surgical History:  Procedure Laterality Date   CARDIOVERSION N/A 03/16/2021   Procedure: CARDIOVERSION;  Surgeon: Antonieta Iba, MD;  Location: [facility] ORS;  Service: Cardiovascular;  Laterality: N/A;   CORONARY ARTERY BYPASS GRAFT  1989   CORONARY STENT INTERVENTION N/A 10/28/2020   Procedure: CORONARY STENT INTERVENTION;  Surgeon: Alwyn Pea, MD;  Location: [facility] INVASIVE CV LAB;  Service: Cardiovascular;  Laterality: N/A;   LEFT HEART CATH AND CORONARY ANGIOGRAPHY N/A 10/28/2020   Procedure: LEFT HEART CATH AND CORONARY ANGIOGRAPHY;  Surgeon: Alwyn Pea, MD;  Location: [facility] INVASIVE CV LAB;  Service: Cardiovascular;  Laterality: N/A;   RIGHT HEART CATH Right 01/22/2023   Procedure: RIGHT HEART CATH;  Surgeon: Yvonne Kendall, MD;  Location: [facility] INVASIVE CV LAB;  Service: Cardiovascular;  Laterality: Right;   RIGHT/LEFT HEART CATH AND CORONARY ANGIOGRAPHY N/A 04/18/2022    Procedure: RIGHT/LEFT HEART CATH AND CORONARY ANGIOGRAPHY;  Surgeon: Orbie Pyo, MD;  Location: [facility] INVASIVE CV LAB;  Service: Cardiovascular;  Laterality: N/A;   TEE WITHOUT CARDIOVERSION N/A 05/01/2022   Procedure: TRANSESOPHAGEAL ECHOCARDIOGRAM (TEE);  Surgeon: Wendall Stade, MD;  Location: [facility] ENDOSCOPY;  Service: Cardiovascular;  Laterality: N/A;   TEE WITHOUT CARDIOVERSION N/A 10/26/2022   Procedure: TRANSESOPHAGEAL ECHOCARDIOGRAM;  Surgeon: Wendall Stade, MD;  Location: [facility] INVASIVE CV LAB;  Service: Cardiovascular;  Laterality: N/A;    Current Outpatient Medications  Medication Sig Dispense Refill   amiodarone (PACERONE) 200 MG tablet Take 1 tablet (200 mg total) by mouth daily. Take 1 tablet daily 90 tablet 3   apixaban (ELIQUIS) 5 MG TABS tablet Take 1 tablet (5 mg total) by mouth 2 (two) times daily. 180 tablet 3   atorvastatin (LIPITOR) 80 MG tablet Take 1 tablet (80 mg total) by mouth daily. 90 tablet 3   empagliflozin (JARDIANCE) 10 MG TABS tablet TAKE 1 TABLET BY MOUTH EVERY DAY BEFORE BREAKFAST 30 tablet 11   ezetimibe (ZETIA) 10 MG tablet Take 1 tablet (10 mg total) by mouth daily. 90 tablet 3   furosemide (LASIX) 40 MG tablet Take 1 tablet (40 mg total) by mouth daily. 30 tablet 5   metoprolol succinate (TOPROL-XL) 25 MG 24 hr tablet Take 0.5 tablet (12.5 mg) by mouth once daily 45 tablet 3   sacubitril-valsartan (ENTRESTO) 24-26 MG Take 1 tablet by mouth 2 (two) times daily. 90 tablet 7   spironolactone (ALDACTONE) 25 MG tablet Take 0.5 tablets (12.5 mg total) by mouth daily. 45 tablet 3   acetaminophen (TYLENOL) 500 MG tablet Take 500-1,000 mg by mouth every 6 (six) hours as needed (pain.). (Patient not taking: Reported on 01/22/2023)     albuterol (VENTOLIN HFA) 108 (90 Base) MCG/ACT inhaler Inhale 2 puffs into the lungs every 6 (six) hours as needed for wheezing or shortness of breath. (Patient not taking: Reported on 01/22/2023) 8 g 0   nitroGLYCERIN (NITROSTAT) 0.4 MG SL  tablet Place 1 tablet (0.4 mg total) under the tongue every 5 (five) minutes as needed for chest pain. (Patient not taking: Reported on 02/05/2023) 25 tablet 2   No current facility-administered medications for this visit.    Allergies:   Codeine   Social History:  The patient  reports that she quit smoking about 35 years ago. Her smoking use included cigarettes. She started smoking about 66 years ago. She has a 30 pack-year smoking history. She has never used smokeless tobacco. She reports that she does not currently use alcohol. She reports that she does not use drugs.   Family History:   family history includes Hypertension in her mother.    Review of Systems: Review of Systems  Constitutional: Negative.   HENT: Negative.    Respiratory: Negative.    Cardiovascular: Negative.   Gastrointestinal: Negative.   Musculoskeletal: Negative.   Neurological: Negative.   Psychiatric/Behavioral: Negative.    All other systems reviewed and are negative.  2-minute PHYSICAL EXAM: VS:  BP 114/62 (BP Location: Left Arm, Patient Position: Sitting, Cuff Size: Normal)   Pulse (!) 48  SpO2 96%  , BMI There is no height or weight on file to calculate BMI. Constitutional:  oriented to person, place, and time. No distress.  HENT:  Head: Grossly normal Eyes:  no discharge. No scleral icterus.  Neck: No JVD, no carotid bruits  Cardiovascular: Regular rate and rhythm, 2/6 systolic ejection murmur left sternal border Pulmonary/Chest: Clear to auscultation bilaterally, no wheezes or rails Abdominal: Soft.  no distension.  no tenderness.  Musculoskeletal: Normal range of motion Neurological:  normal muscle tone. Coordination normal. No atrophy Skin: Skin warm and dry Psychiatric: normal affect, pleasant   Recent Labs: 07/19/2022: ALT 25; B Natriuretic Peptide 256.9; TSH 3.144 08/15/2022: NT-Pro BNP 435 01/16/2023: BUN 14; Creatinine, Ser 1.20; Platelets 324 01/22/2023: Hemoglobin 12.6; Potassium  3.9; Sodium 144    Lipid Panel Lab Results  Component Value Date   CHOL 156 06/18/2022   HDL 47 06/18/2022   LDLCALC 82 06/18/2022   TRIG 133 06/18/2022      Wt Readings from Last 3 Encounters:  01/22/23 133 lb 12.8 oz (60.7 kg)  01/08/23 135 lb (61.2 kg)  12/17/22 136 lb (61.7 kg)     ASSESSMENT AND PLAN:  Problem List Items Addressed This Visit       Cardiology Problems   Mitral regurgitation - Primary   Relevant Orders   EKG 12-Lead (Completed)   Basic metabolic panel   B Nat Peptide   CAD (coronary artery disease)   Relevant Orders   EKG 12-Lead (Completed)   Basic metabolic panel   B Nat Peptide     Other   COPD (chronic obstructive pulmonary disease) (HCC)   Relevant Orders   Basic metabolic panel   B Nat Peptide   Basic metabolic panel   B Nat Peptide   Other Visit Diagnoses       Paroxysmal atrial fibrillation (HCC)       Relevant Orders   EKG 12-Lead (Completed)   Basic metabolic panel   B Nat Peptide     Severe mitral valve regurgitation       Relevant Orders   EKG 12-Lead (Completed)   Basic metabolic panel   B Nat Peptide     Chronic systolic heart failure (HCC)       Relevant Orders   EKG 12-Lead (Completed)   Basic metabolic panel   B Nat Peptide     Essential hypertension       Relevant Orders   EKG 12-Lead (Completed)   Basic metabolic panel   B Nat Peptide     Ischemic cardiomyopathy       Relevant Orders   EKG 12-Lead (Completed)   Basic metabolic panel   B Nat Peptide     Irregular heart rate       Relevant Orders   Basic metabolic panel   B Nat Peptide       Coronary disease with stable angina History of PCI to ostial and mid LAD Nonobstructive disease on heart catheterization February 2024 Currently with no symptoms of angina. No further workup at this time. Continue current medication regimen.  Atrial fibrillation with RVR December 2022,  cardioversion January 2023, recurrent A-fib August 2023 Started on  amiodarone November 2023 converting to normal sinus rhythm Asymptomatic sinus bradycardia on amiodarone, metoprolol Tolerating Eliquis 5 twice daily  Severe mitral valve regurgitation Ejection fraction 45 to 50% with severe MR tentative surgery January 15th 2025 for valve replacement Stop Eliquis 2 days prior to surgery January 13  Pulmonary hypertension  Elevated right heart pressures on right heart catheterization January 22, 2023 Lasix increased up to 40 daily, continue Lasix 40 daily with spironolactone 12.5 daily Stable BMP January 16, 2023 Repeat BMP and BNP today   Signed, Dossie Arbour, M.D., Ph.D. [facility] [place], [place] [phone]

## 2023-02-05 ENCOUNTER — Ambulatory Visit: Payer: 59 | Attending: Cardiovascular Disease | Admitting: Cardiovascular Disease

## 2023-02-05 ENCOUNTER — Encounter: Payer: Self-pay | Admitting: Cardiovascular Disease

## 2023-02-05 VITALS — BP 114/62 | HR 48

## 2023-02-05 DIAGNOSIS — I5022 Chronic systolic (congestive) heart failure: Secondary | ICD-10-CM

## 2023-02-05 DIAGNOSIS — I10 Essential (primary) hypertension: Secondary | ICD-10-CM

## 2023-02-05 DIAGNOSIS — I480 Paroxysmal atrial fibrillation: Secondary | ICD-10-CM

## 2023-02-05 DIAGNOSIS — I340 Nonrheumatic mitral (valve) insufficiency: Secondary | ICD-10-CM | POA: Diagnosis not present

## 2023-02-05 DIAGNOSIS — I499 Cardiac arrhythmia, unspecified: Secondary | ICD-10-CM

## 2023-02-05 DIAGNOSIS — J432 Centrilobular emphysema: Secondary | ICD-10-CM

## 2023-02-05 DIAGNOSIS — I25118 Atherosclerotic heart disease of native coronary artery with other forms of angina pectoris: Secondary | ICD-10-CM

## 2023-02-05 DIAGNOSIS — I255 Ischemic cardiomyopathy: Secondary | ICD-10-CM

## 2023-02-05 DIAGNOSIS — J449 Chronic obstructive pulmonary disease, unspecified: Secondary | ICD-10-CM

## 2023-02-05 NOTE — Patient Instructions (Addendum)
Medication Instructions:  No eliquis before surgery starting Jan 13  Stay on spironolactone 1/2 pill Take lasix 40 daily  If you need a refill on your cardiac medications before your next appointment, please call your pharmacy.   Lab work: BMP, BNP  Testing/Procedures: No new testing needed  Follow-Up: At [facility], you and your health needs are our priority.  As part of our continuing mission to provide you with exceptional heart care, we have created designated Provider Care Teams.  These Care Teams include your primary Cardiologist (physician) and Advanced Practice Providers (APPs -  Physician Assistants and Nurse Practitioners) who all work together to provide you with the care you need, when you need it.  You will need a follow up appointment in 3 months  Providers on your designated Care Team:   Penny Ducking, NP Penny Listen, PA-C Penny Mccullough, [place]  COVID-19 Vaccine Information can be found at: [email or web address] For questions related to vaccine distribution or appointments, please email [email or web address] or call [phone].

## 2023-02-06 ENCOUNTER — Ambulatory Visit: Payer: 59

## 2023-02-07 LAB — BASIC METABOLIC PANEL
BUN/Creatinine Ratio: 13 (ref 12–28)
BUN: 14 mg/dL (ref 8–27)
CO2: 24 mmol/L (ref 20–29)
Calcium: 8.9 mg/dL (ref 8.7–10.3)
Chloride: 105 mmol/L (ref 96–106)
Creatinine, Ser: 1.12 mg/dL — ABNORMAL HIGH (ref 0.57–1.00)
Glucose: 97 mg/dL (ref 70–99)
Potassium: 4.3 mmol/L (ref 3.5–5.2)
Sodium: 143 mmol/L (ref 134–144)
eGFR: 51 mL/min/{1.73_m2} — ABNORMAL LOW (ref >59)

## 2023-02-07 LAB — BRAIN NATRIURETIC PEPTIDE: BNP: 519 pg/mL — ABNORMAL HIGH (ref 0.0–100.0)

## 2023-02-07 IMAGING — CT CT HEAD W/O CM
3 series · 15 of 46 positions shown, 18 images · non-contrast
Comparison: None.

CLINICAL DATA: Mental status change.

EXAM:
CT HEAD WITHOUT CONTRAST
TECHNIQUE: Contiguous axial images were obtained from the base of the skull
through the vertex without intravenous contrast.

[Series 3: head wo · axial · 0.40mm/px · z∈[-121,-1]mm · 9 of 29 slices shown, 12 images]
[im 3/29  brain]
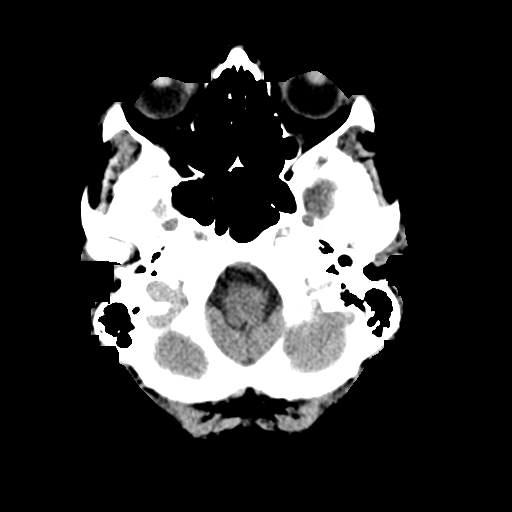
[im 3/29  bone]
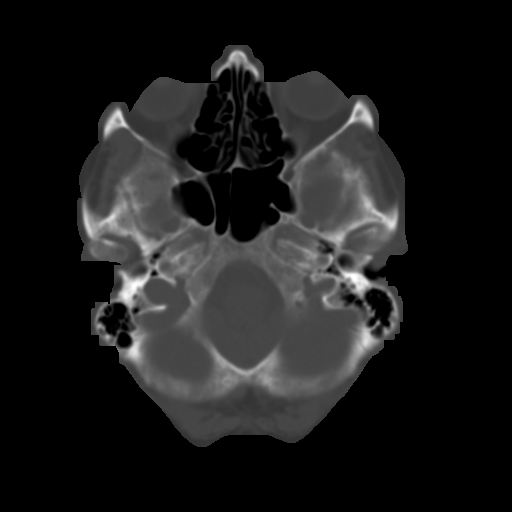
[im 6/29  brain]
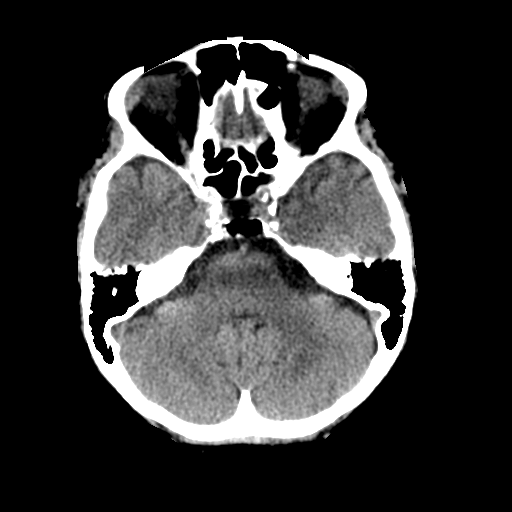
[im 9/29  brain]
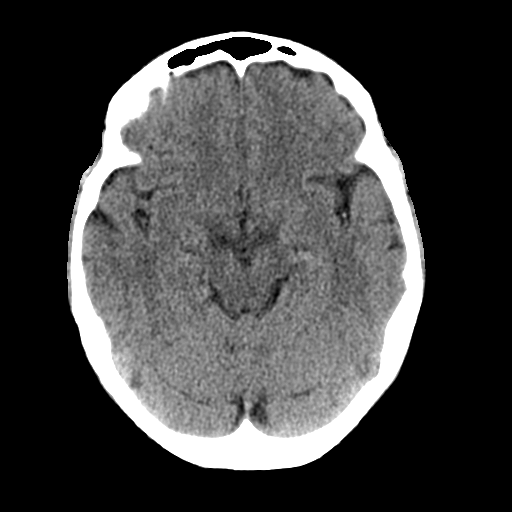
[im 12/29  brain]
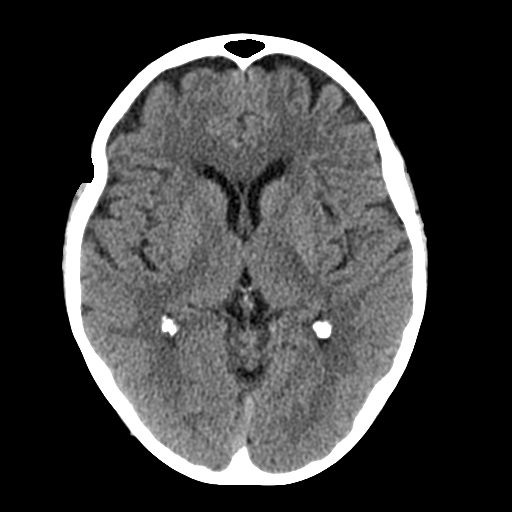
[im 15/29  brain]
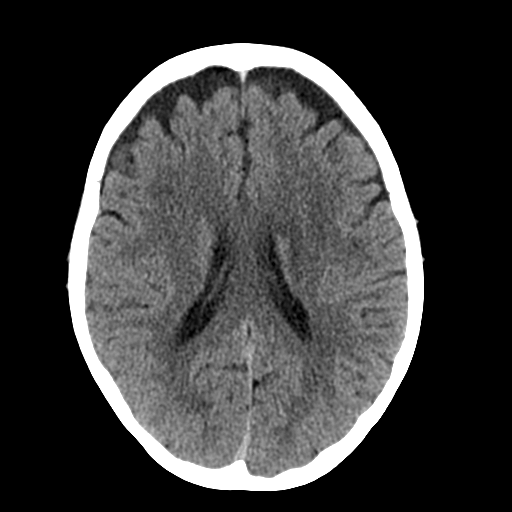
[im 15/29  bone]
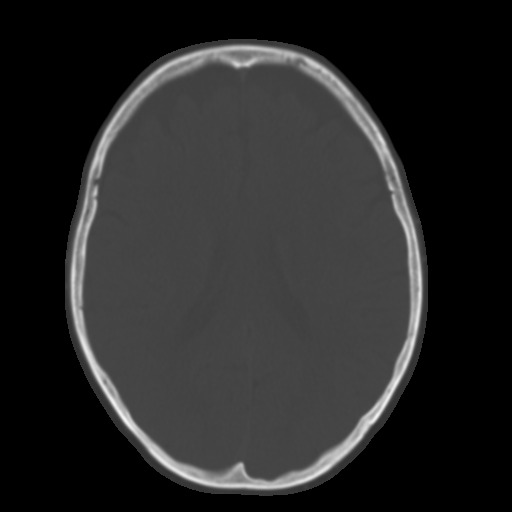
[im 18/29  brain]
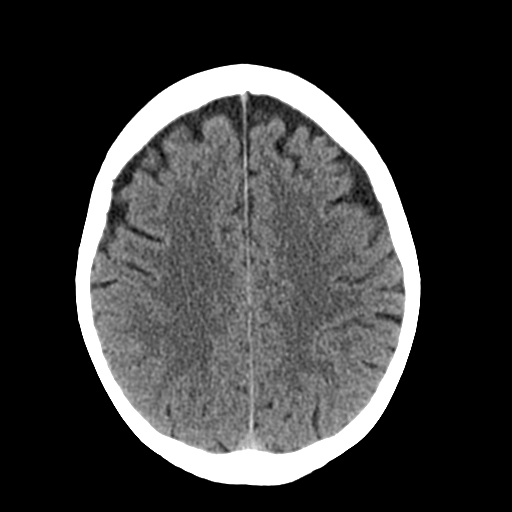
[im 21/29  brain]
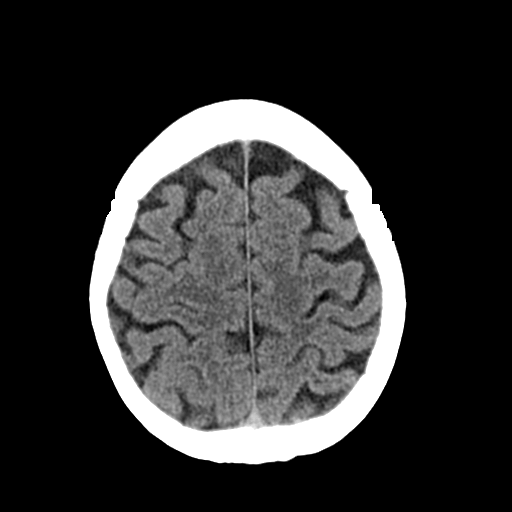
[im 24/29  brain]
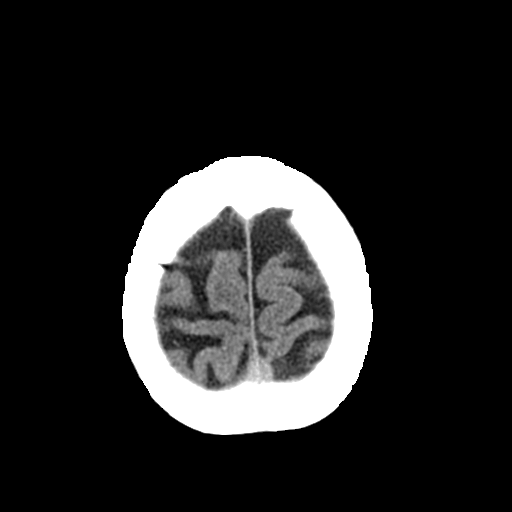
[im 27/29  brain]
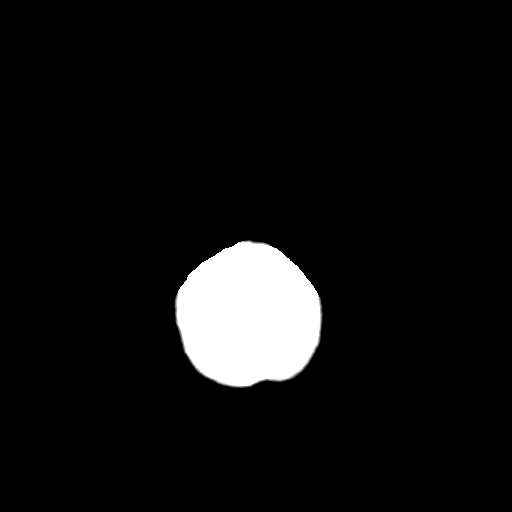
[im 27/29  bone]
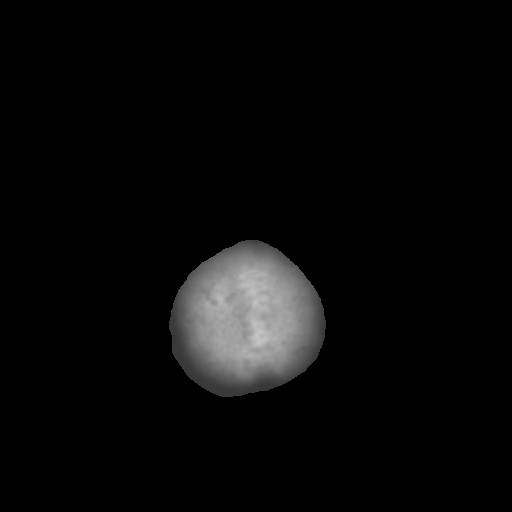

[Series 4: coronal soft tissue · coronal · 0.29mm/px · 3 of 80 slices shown]
[im 27/80  brain]
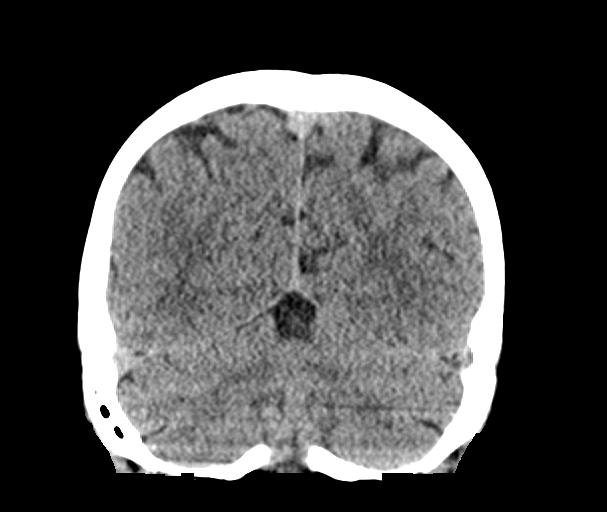
[im 36/80  brain]
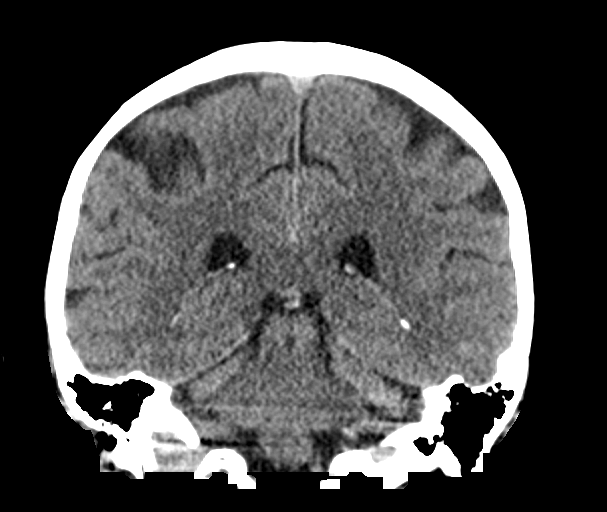
[im 44/80  brain]
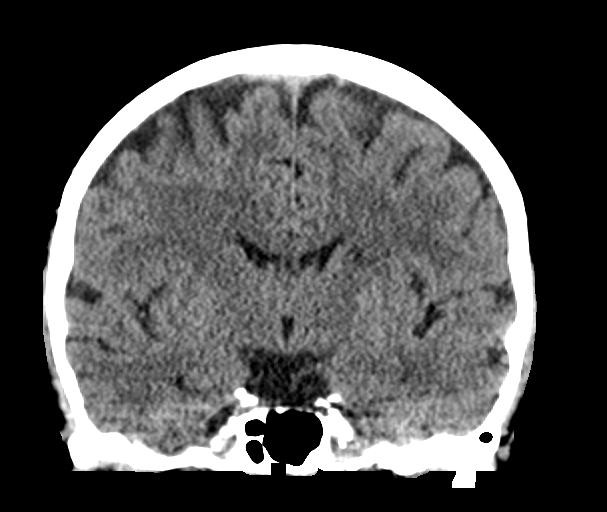

[Series 5: sagittal soft tissue · sagittal · 0.33mm/px · 3 of 59 slices shown]
[im 20/59  brain]
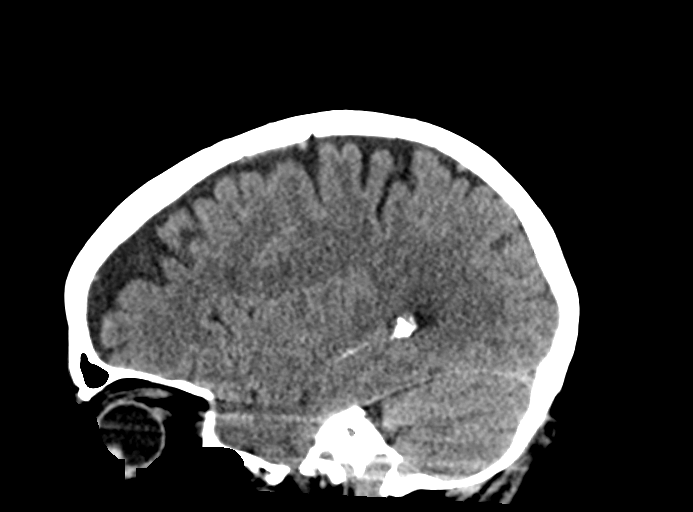
[im 30/59  brain]
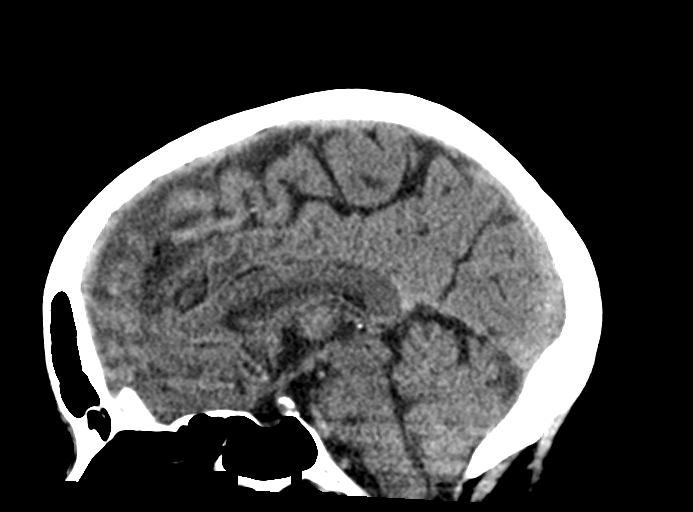
[im 39/59  brain]
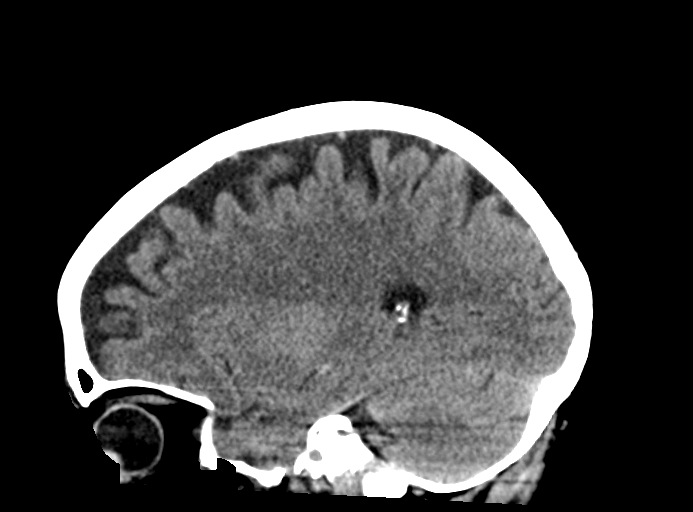

[15 of 46 positions shown; findings below may reference images not displayed]

FINDINGS: Brain: Within the left basal ganglia there is a focal area of low
attenuation measuring 1 cm, image [DATE]. This is consistent with a
subacute to chronic lacunar infarct. No signs of acute intracranial
hemorrhage, mass or abnormal extra-axial fluid collection.

Vascular: No hyperdense vessel or unexpected calcification.

Skull: Normal. Negative for fracture or focal lesion.

Sinuses/Orbits: No acute finding.

Other: None.
IMPRESSION: 1. No acute intracranial abnormalities.
2. Subacute to chronic left basal ganglia lacunar infarct.

## 2023-02-07 IMAGING — CR DG CHEST 2V
1 series · 2 of 2 positions shown · non-contrast
Comparison: None.

CLINICAL DATA: Chest pain abnormal EKG

EXAM:
CHEST - 2 VIEW

[Series 1: dg chest 2 view · 0.14mm/px · 2 of 2 slices shown]
[im 1/2]
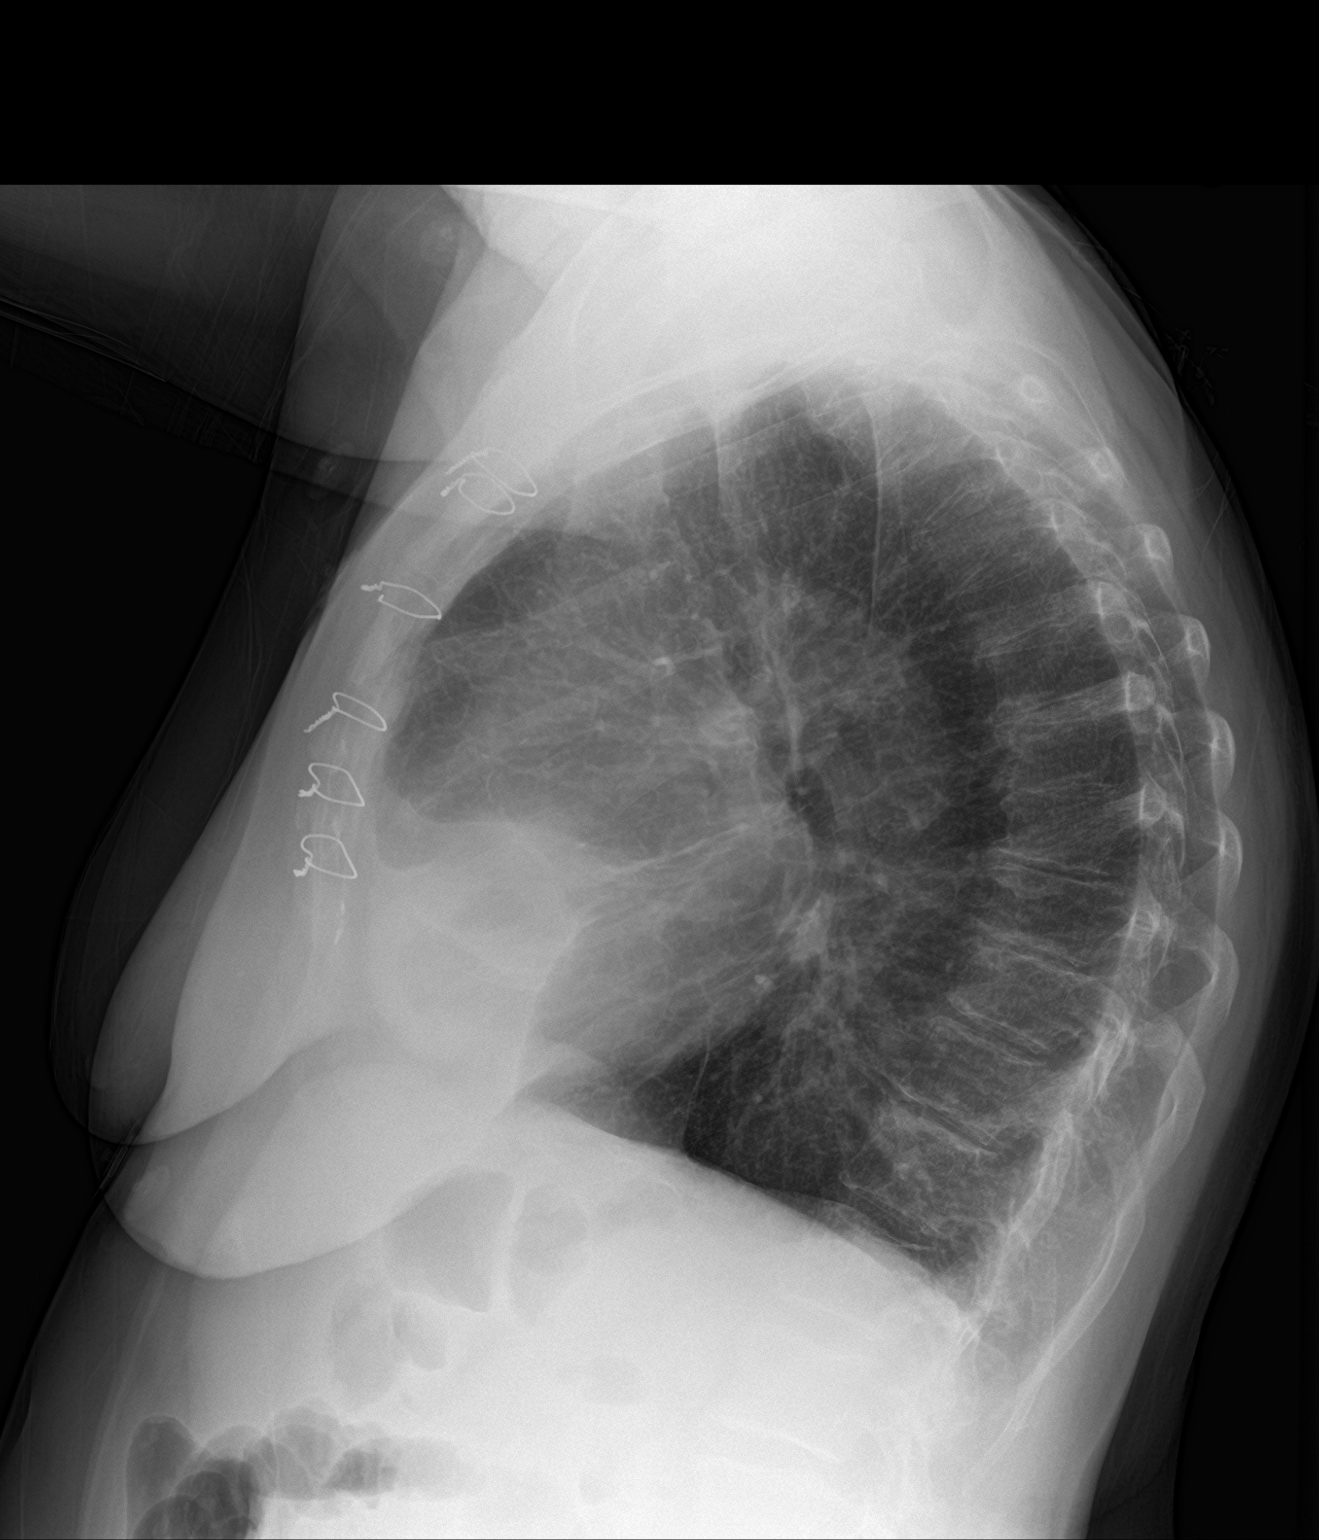
[im 2/2]
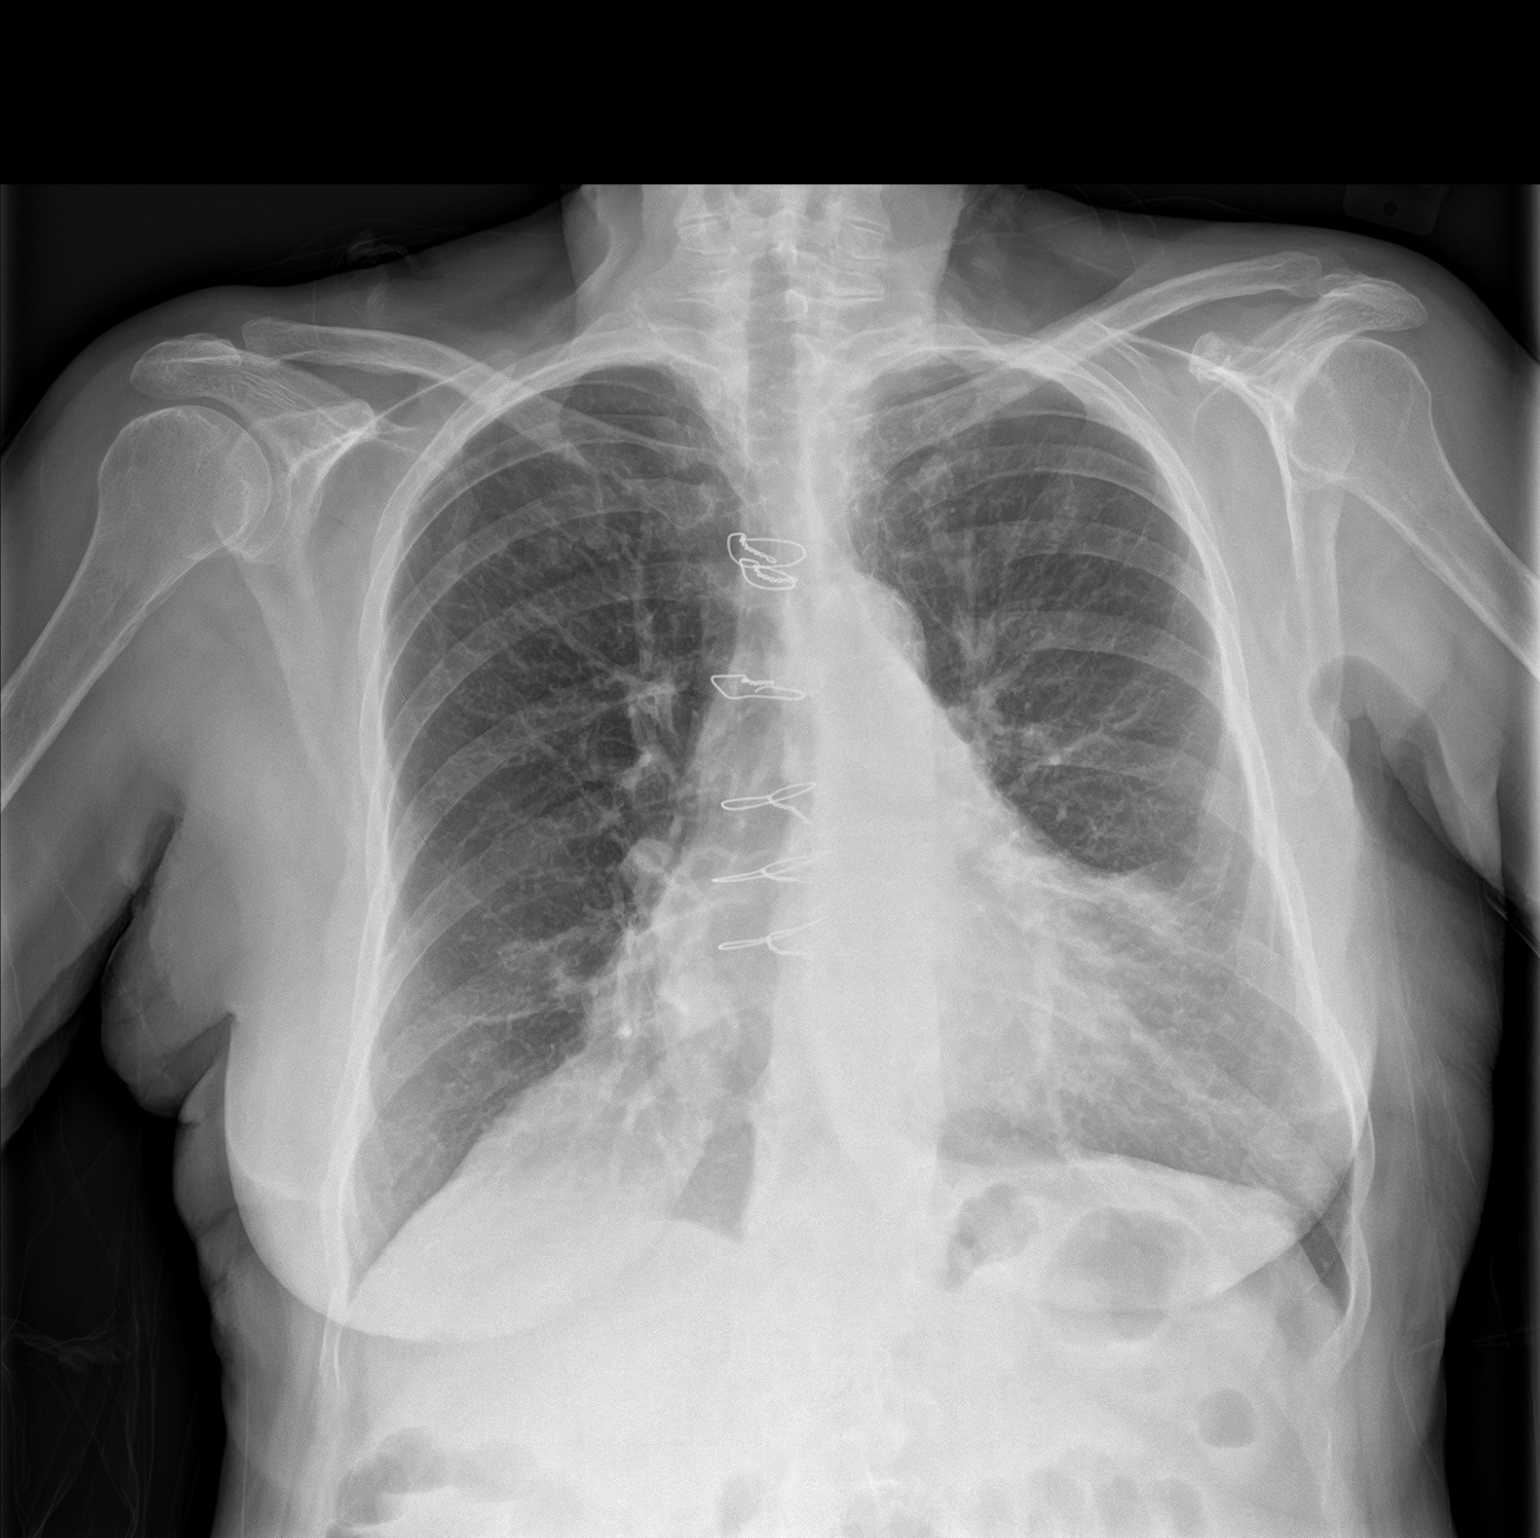

[2 of 2 positions shown; findings below may reference images not displayed]

FINDINGS: Post sternotomy changes. Mild diffuse bronchitic changes. No pleural
effusion. Patchy lingular and right middle lobe opacity. Cardiac
size within normal limits. Aortic atherosclerosis. No pneumothorax
IMPRESSION: 1. Patchy lingular and right middle lobe opacity which may be due to
atelectasis, scar, or minimal infiltrate.

## 2023-02-08 IMAGING — US US CAROTID DUPLEX BILAT
1 series · 14 of 24 positions shown · non-contrast
Comparison: None.

CLINICAL DATA: Stroke

EXAM:
BILATERAL CAROTID DUPLEX ULTRASOUND
TECHNIQUE: Gray scale imaging, color Doppler and duplex ultrasound were
performed of bilateral carotid and vertebral arteries in the neck.

[Series 1: us carotid bilateral · 14 of 67 slices shown]
[im 1/67]
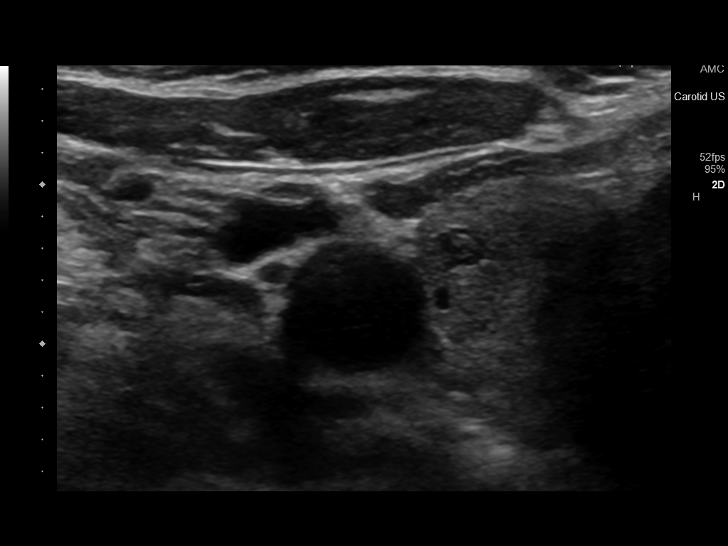
[im 6/67]
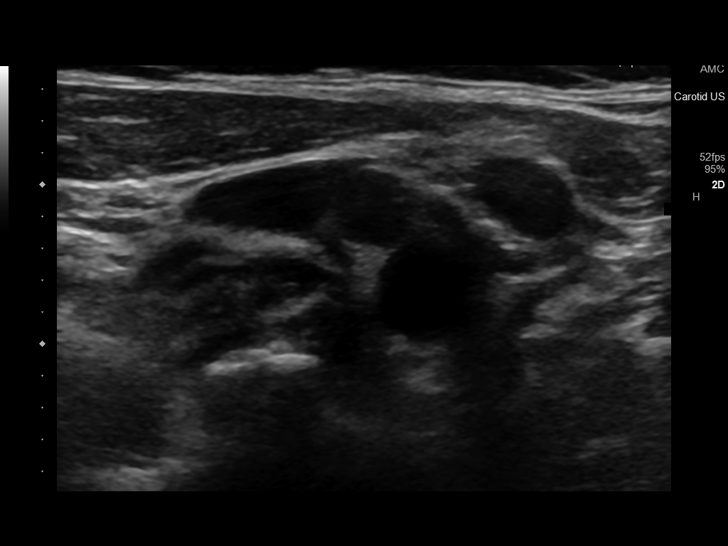
[im 12/67]
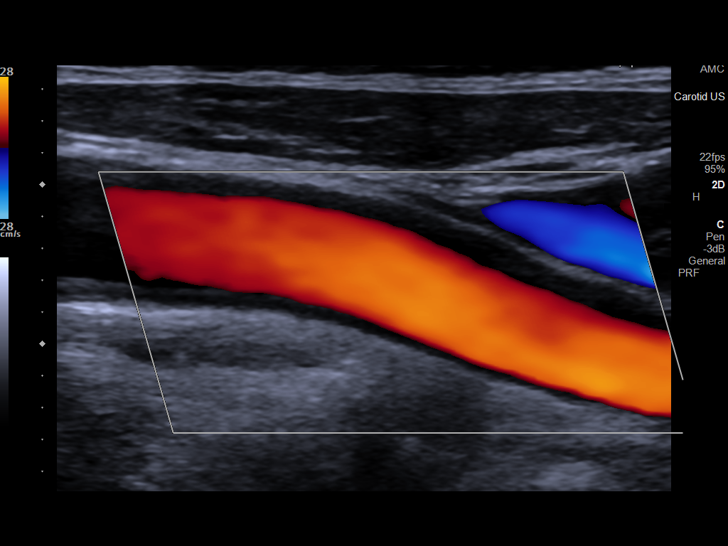
[im 18/67]
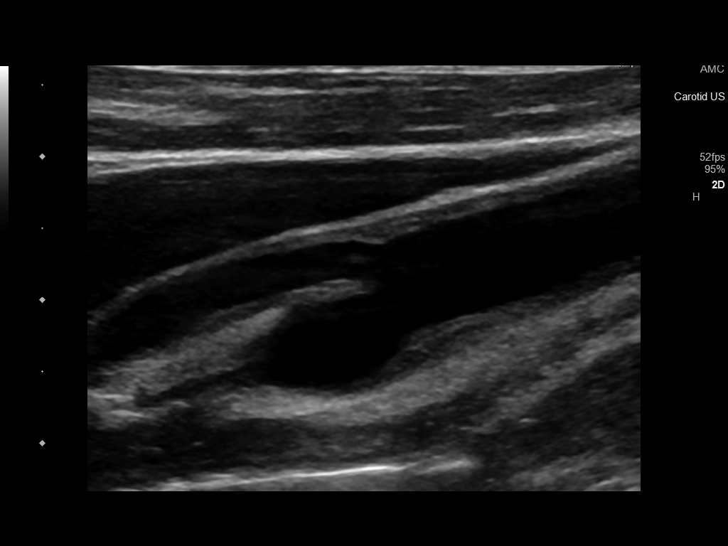
[im 21/67]
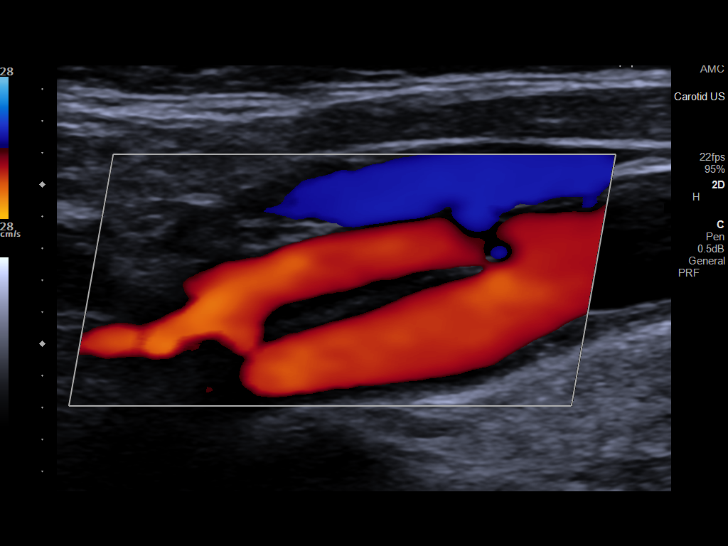
[im 26/67]
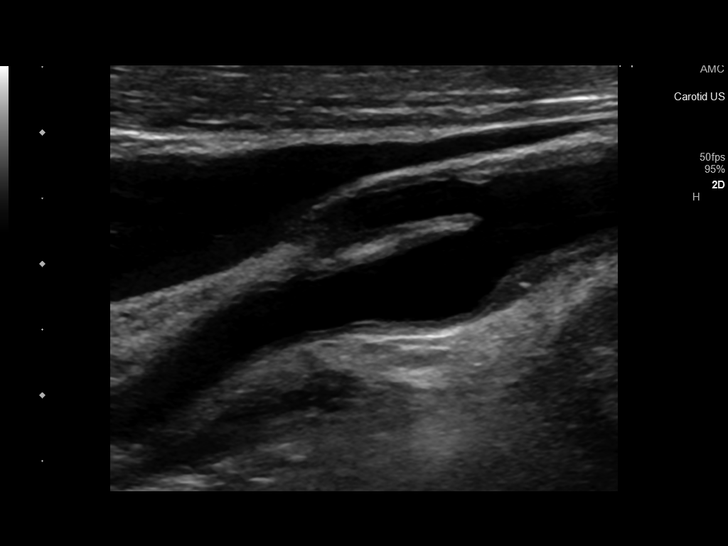
[im 32/67]
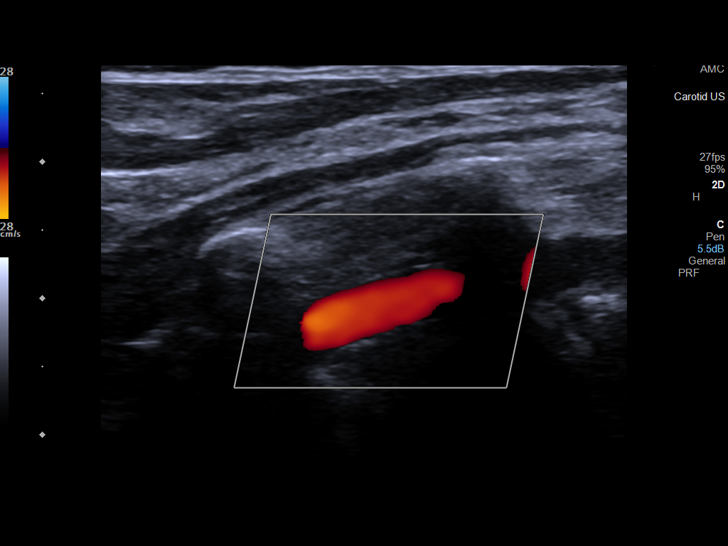
[im 35/67]
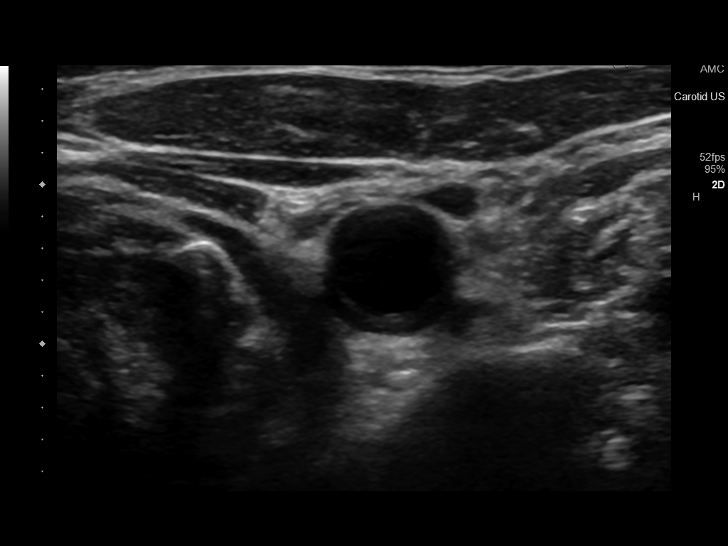
[im 41/67]
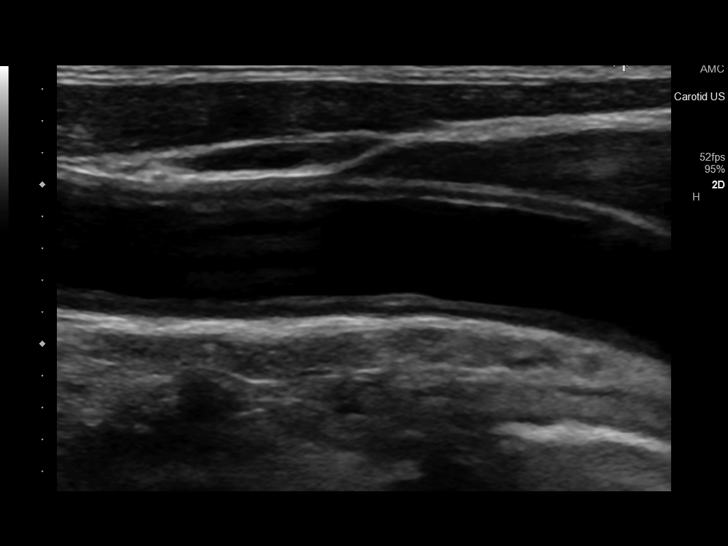
[im 46/67]
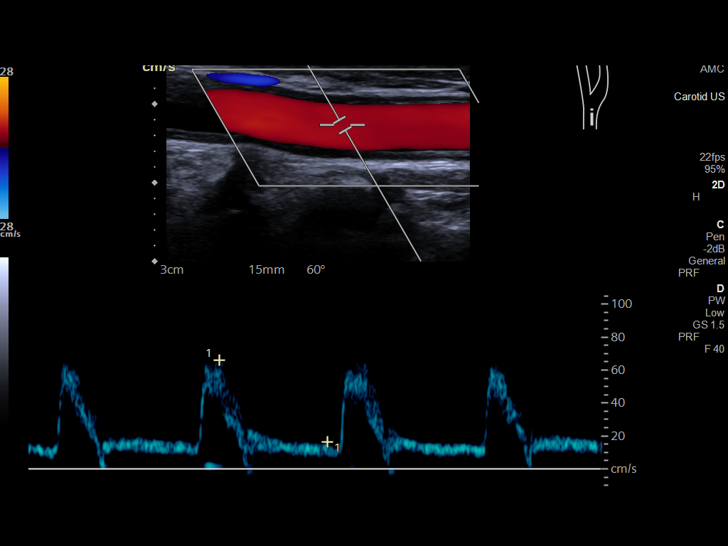
[im 52/67]
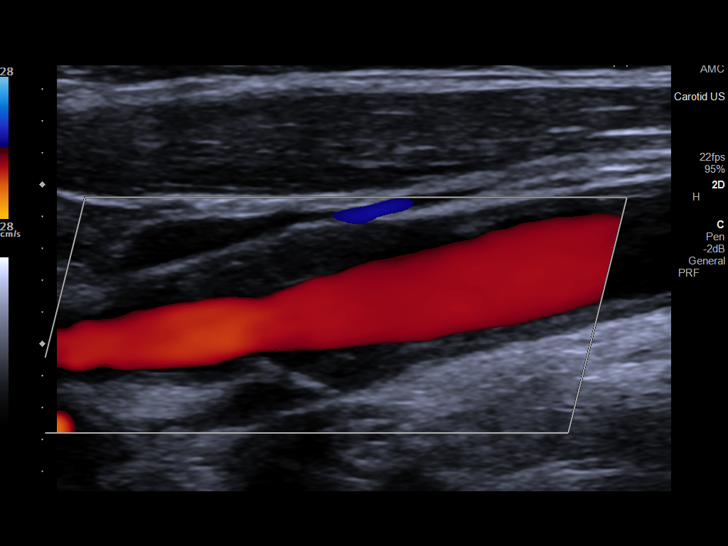
[im 55/67]
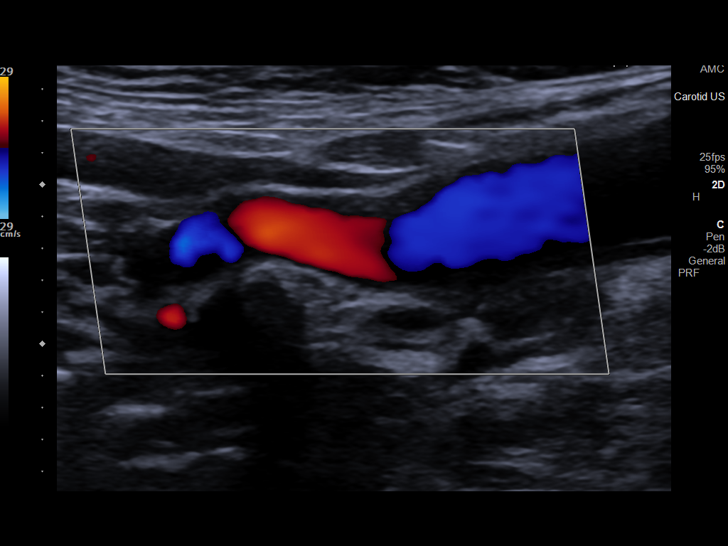
[im 61/67]
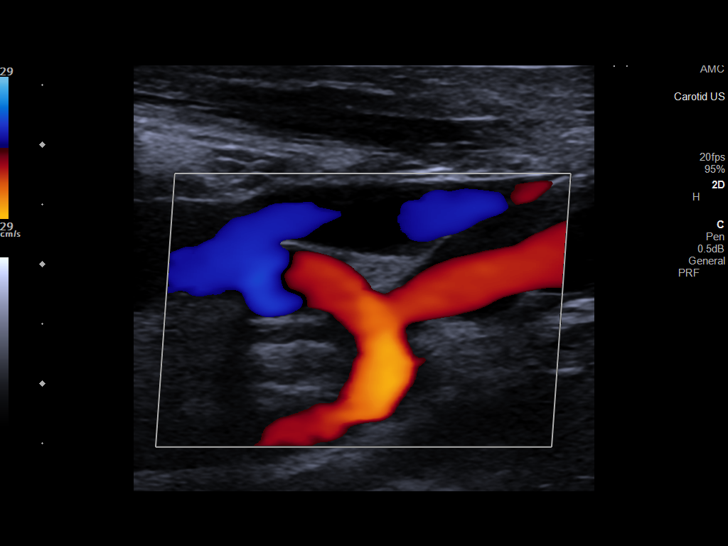
[im 67/67]
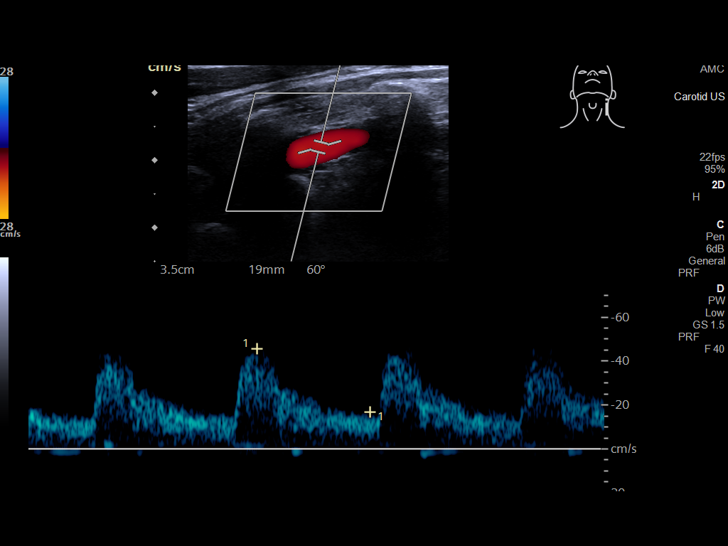

[14 of 24 positions shown; findings below may reference images not displayed]

FINDINGS: Criteria: Quantification of carotid stenosis is based on velocity
parameters that correlate the residual internal carotid diameter
with NASCET-based stenosis levels, using the diameter of the distal
internal carotid lumen as the denominator for stenosis measurement.

The following velocity measurements were obtained:

RIGHT

ICA: 53/17 cm/sec

CCA: 55/12 cm/sec

SYSTOLIC ICA/CCA RATIO:

ECA: 77 cm/sec

LEFT

ICA: 86/28 cm/sec

CCA: 58/16 cm/sec

SYSTOLIC ICA/CCA RATIO:

ECA: 59 cm/sec

RIGHT CAROTID ARTERY: Mild heterogeneous atheromatous plaque noted
at the right ICA origin.

RIGHT VERTEBRAL ARTERY:  Antegrade flow.

LEFT CAROTID ARTERY: Moderate predominantly noncalcified plaque
noted at the carotid bifurcation.

LEFT VERTEBRAL ARTERY:  Antegrade flow.
IMPRESSION: Left greater than right carotid bifurcation atheromatous plaque with
Doppler measurements indicative of less than 50% stenosis.

## 2023-02-11 ENCOUNTER — Encounter: Payer: Self-pay | Admitting: Emergency Medicine

## 2023-03-01 NOTE — Pre-Procedure Instructions (Signed)
 Surgical Instructions   Your procedure is scheduled on March 06, 2023. Report to [facility] Main Entrance A at 6:30 A.M., then check in with the Admitting office. Any questions or running late day of surgery: call [phone]  Questions prior to your surgery date: call [phone], Monday-Friday, 8am-4pm. If you experience any cold or flu symptoms such as cough, fever, chills, shortness of breath, etc. between now and your scheduled surgery, please notify us  at the above number.     Remember:  Do not eat or drink after midnight the night before your surgery    Take these medicines the morning of surgery with A SIP OF WATER: amiodarone  (PACERONE )  atorvastatin  (LIPITOR )  ezetimibe  (ZETIA )  metoprolol  succinate (TOPROL -XL)    May take these medicines IF NEEDED: acetaminophen  (TYLENOL )  albuterol  (VENTOLIN  HFA) inhaler - please bring your inhaler with you morning of surgery nitroGLYCERIN  (NITROSTAT ) - if dose taken prior to surgery, please call either of the above phone numbers   STOP taking your apixaban  (ELIQUIS ) five days prior to surgery. Your last dose will be January 9th.  STOP empagliflozin  (JARDIANCE ) and sacubitril-valsartan (ENTRESTO ) three days prior to surgery. Your last doses will be January 11th.   One week prior to surgery, STOP taking any Aspirin  (unless otherwise instructed by your surgeon) Aleve, Naproxen, Ibuprofen, Motrin, Advil, Goody's, BC's, all herbal medications, fish oil, and non-prescription vitamins.                     Do NOT Smoke (Tobacco/Vaping) for 24 hours prior to your procedure.  If you use a CPAP at night, you may bring your mask/headgear for your overnight stay.   You will be asked to remove any contacts, glasses, piercing's, hearing aid's, dentures/partials prior to surgery. Please bring cases for these items if needed.    Patients discharged the day of surgery will not be allowed to drive home, and someone needs to stay with them  for 24 hours.  SURGICAL WAITING ROOM VISITATION Patients may have no more than 2 support people in the waiting area - these visitors may rotate.   Pre-op nurse will coordinate an appropriate time for 1 ADULT support person, who may not rotate, to accompany patient in pre-op.  Children under the age of 37 must have an adult with them who is not the patient and must remain in the main waiting area with an adult.  If the patient needs to stay at the hospital during part of their recovery, the visitor guidelines for inpatient rooms apply.  Please refer to the [facility] website for the visitor guidelines for any additional information.   If you received a COVID test during your pre-op visit  it is requested that you wear a mask when out in public, stay away from anyone that may not be feeling well and notify your surgeon if you develop symptoms. If you have been in contact with anyone that has tested positive in the last 10 days please notify you surgeon.      Pre-operative CHG Bathing Instructions   You can play a key role in reducing the risk of infection after surgery. Your skin needs to be as free of germs as possible. You can reduce the number of germs on your skin by washing with CHG (chlorhexidine  gluconate) soap before surgery. CHG is an antiseptic soap that kills germs and continues to kill germs even after washing.   DO NOT use if you have an allergy to chlorhexidine /CHG or  antibacterial soaps. If your skin becomes reddened or irritated, stop using the CHG and notify one of our RNs at [phone].              TAKE A SHOWER THE NIGHT BEFORE SURGERY AND THE DAY OF SURGERY    Please keep in mind the following:  DO NOT shave, including legs and underarms, 48 hours prior to surgery.   You may shave your face before/day of surgery.  Place clean sheets on your bed the night before surgery Use a clean washcloth (not used since being washed) for each shower. DO NOT sleep with pet's  night before surgery.  CHG Shower Instructions:  Wash your face and private area with normal soap. If you choose to wash your hair, wash first with your normal shampoo.  After you use shampoo/soap, rinse your hair and body thoroughly to remove shampoo/soap residue.  Turn the water OFF and apply half the bottle of CHG soap to a CLEAN washcloth.  Apply CHG soap ONLY FROM YOUR NECK DOWN TO YOUR TOES (washing for 3-5 minutes)  DO NOT use CHG soap on face, private areas, open wounds, or sores.  Pay special attention to the area where your surgery is being performed.  If you are having back surgery, having someone wash your back for you may be helpful. Wait 2 minutes after CHG soap is applied, then you may rinse off the CHG soap.  Pat dry with a clean towel  Put on clean pajamas    Additional instructions for the day of surgery: DO NOT APPLY any lotions, deodorants, cologne, or perfumes.   Do not wear jewelry or makeup Do not wear nail polish, gel polish, artificial nails, or any other type of covering on natural nails (fingers and toes) Do not bring valuables to the hospital. [facility] is not responsible for valuables/personal belongings. Put on clean/comfortable clothes.  Please brush your teeth.  Ask your nurse before applying any prescription medications to the skin.

## 2023-03-04 ENCOUNTER — Other Ambulatory Visit: Payer: Self-pay

## 2023-03-04 ENCOUNTER — Ambulatory Visit (HOSPITAL_BASED_OUTPATIENT_CLINIC_OR_DEPARTMENT_OTHER)
Admission: RE | Admit: 2023-03-04 | Discharge: 2023-03-04 | Disposition: A | Discharge status: Home / Self Care | Payer: 59 | Source: Ambulatory Visit | Attending: Thoracic Surgery (Cardiothoracic Vascular Surgery) | Admitting: Thoracic Surgery (Cardiothoracic Vascular Surgery)

## 2023-03-04 ENCOUNTER — Encounter (HOSPITAL_COMMUNITY)
Admission: RE | Admit: 2023-03-04 | Discharge: 2023-03-04 | Disposition: A | Discharge status: Home / Self Care | Payer: 59 | Source: Ambulatory Visit | Attending: Thoracic Surgery (Cardiothoracic Vascular Surgery) | Admitting: Thoracic Surgery (Cardiothoracic Vascular Surgery)

## 2023-03-04 ENCOUNTER — Encounter (HOSPITAL_COMMUNITY): Payer: Self-pay

## 2023-03-04 ENCOUNTER — Ambulatory Visit (HOSPITAL_COMMUNITY)
Admission: RE | Admit: 2023-03-04 | Discharge: 2023-03-04 | Disposition: A | Discharge status: Home / Self Care | Payer: 59 | Source: Ambulatory Visit | Attending: Thoracic Surgery (Cardiothoracic Vascular Surgery)

## 2023-03-04 ENCOUNTER — Ambulatory Visit (HOSPITAL_COMMUNITY)
Admission: RE | Admit: 2023-03-04 | Discharge: 2023-03-04 | Disposition: A | Discharge status: Home / Self Care | Payer: 59 | Source: Ambulatory Visit | Attending: Thoracic Surgery (Cardiothoracic Vascular Surgery) | Admitting: Thoracic Surgery (Cardiothoracic Vascular Surgery)

## 2023-03-04 VITALS — BP 111/51 | HR 108 | Temp 98.0°F | Resp 17 | Ht 61.0 in | Wt 132.9 lb

## 2023-03-04 DIAGNOSIS — Z01818 Encounter for other preprocedural examination: Secondary | ICD-10-CM

## 2023-03-04 DIAGNOSIS — I340 Nonrheumatic mitral (valve) insufficiency: Secondary | ICD-10-CM | POA: Insufficient documentation

## 2023-03-04 HISTORY — DX: Cardiac arrhythmia, unspecified: I49.9

## 2023-03-04 HISTORY — DX: Cardiac murmur, unspecified: R01.1

## 2023-03-04 HISTORY — DX: Personal history of urinary calculi: Z87.442

## 2023-03-04 LAB — PULMONARY FUNCTION TEST
DL/VA % pred: 77 %
DL/VA: 3.25 ml/min/mmHg/L
DLCO unc % pred: 64 %
DLCO unc: 11.02 ml/min/mmHg
FEF 25-75 Post: 0.77 L/s
FEF 25-75 Pre: 0.63 L/s
FEF2575-%Change-Post: 21 %
FEF2575-%Pred-Post: 53 %
FEF2575-%Pred-Pre: 43 %
FEV1-%Change-Post: 6 %
FEV1-%Pred-Post: 75 %
FEV1-%Pred-Pre: 70 %
FEV1-Post: 1.35 L
FEV1-Pre: 1.28 L
FEV1FVC-%Change-Post: 3 %
FEV1FVC-%Pred-Pre: 84 %
FEV6-%Change-Post: 3 %
FEV6-%Pred-Post: 89 %
FEV6-%Pred-Pre: 86 %
FEV6-Post: 2.04 L
FEV6-Pre: 1.97 L
FEV6FVC-%Change-Post: 1 %
FEV6FVC-%Pred-Post: 105 %
FEV6FVC-%Pred-Pre: 103 %
FVC-%Change-Post: 2 %
FVC-%Pred-Post: 85 %
FVC-%Pred-Pre: 83 %
FVC-Post: 2.08 L
FVC-Pre: 2.02 L
Post FEV1/FVC ratio: 65 %
Post FEV6/FVC ratio: 99 %
Pre FEV1/FVC ratio: 63 %
Pre FEV6/FVC Ratio: 98 %
RV % pred: 92 %
RV: 1.99 L
TLC % pred: 87 %
TLC: 4.02 L

## 2023-03-04 LAB — COMPREHENSIVE METABOLIC PANEL
ALT: 25 U/L (ref 0–44)
AST: 21 U/L (ref 15–41)
Albumin: 4 g/dL (ref 3.5–5.0)
Alkaline Phosphatase: 102 U/L (ref 38–126)
Anion gap: 10 (ref 5–15)
BUN: 24 mg/dL — ABNORMAL HIGH (ref 8–23)
CO2: 26 mmol/L (ref 22–32)
Calcium: 9.4 mg/dL (ref 8.9–10.3)
Chloride: 103 mmol/L (ref 98–111)
Creatinine, Ser: 1.31 mg/dL — ABNORMAL HIGH (ref 0.44–1.00)
GFR, Estimated: 42 mL/min — ABNORMAL LOW (ref >60)
Glucose, Bld: 106 mg/dL — ABNORMAL HIGH (ref 70–99)
Potassium: 4.8 mmol/L (ref 3.5–5.1)
Sodium: 139 mmol/L (ref 135–145)
Total Bilirubin: 1.1 mg/dL (ref 0.0–1.2)
Total Protein: 7 g/dL (ref 6.5–8.1)

## 2023-03-04 LAB — PROTIME-INR
INR: 1 (ref 0.8–1.2)
Prothrombin Time: 12.9 s (ref 11.4–15.2)

## 2023-03-04 LAB — URINALYSIS, ROUTINE W REFLEX MICROSCOPIC
Bilirubin Urine: NEGATIVE
Glucose, UA: >=500 mg/dL — AB
Ketones, ur: NEGATIVE mg/dL
Nitrite: NEGATIVE
Protein, ur: NEGATIVE mg/dL
Specific Gravity, Urine: 1.015 (ref 1.005–1.030)
WBC, UA: >50 WBC/hpf (ref 0–5)
pH: 5 (ref 5.0–8.0)

## 2023-03-04 LAB — HEMOGLOBIN A1C
Hgb A1c MFr Bld: 6.1 % — ABNORMAL HIGH (ref 4.8–5.6)
Mean Plasma Glucose: 128.37 mg/dL

## 2023-03-04 LAB — CBC
HCT: 43.2 % (ref 36.0–46.0)
Hemoglobin: 14.2 g/dL (ref 12.0–15.0)
MCH: 31.1 pg (ref 26.0–34.0)
MCHC: 32.9 g/dL (ref 30.0–36.0)
MCV: 94.5 fL (ref 80.0–100.0)
Platelets: 327 10*3/uL (ref 150–400)
RBC: 4.57 MIL/uL (ref 3.87–5.11)
RDW: 13.6 % (ref 11.5–15.5)
WBC: 8.7 10*3/uL (ref 4.0–10.5)
nRBC: 0 % (ref 0.0–0.2)

## 2023-03-04 LAB — SURGICAL PCR SCREEN
MRSA, PCR: NEGATIVE
Staphylococcus aureus: NEGATIVE

## 2023-03-04 LAB — APTT: aPTT: 27 s (ref 24–36)

## 2023-03-04 MED ORDER — ALBUTEROL SULFATE (2.5 MG/3ML) 0.083% IN NEBU
2.5000 mg | INHALATION_SOLUTION | Freq: Once | RESPIRATORY_TRACT | Status: AC
  Administered 2023-03-04: 2.5 mg via RESPIRATORY_TRACT

## 2023-03-04 NOTE — Progress Notes (Signed)
 PCP - None Cardiologist - Dr. Timothy Gollan - Last office visit 02/05/2023 Heart Failure MD - Dr. Ezra Shuck - Last office visit 07/31/2022  PPM/ICD - Denies Device Orders - n/a Rep Notified - n/a  Chest x-ray - 03/04/2023 EKG - 02/05/2023 Stress Test - 09/13/2022 ECHO - 10/26/2022 Cardiac Cath - 01/22/2023  Sleep Study - Denies CPAP - n/a  No DM  Last dose of GLP1 agonist- n/a GLP1 instructions: n/a  Blood Thinner Instructions: Pt instructed to hold Eliquis  for 5 days. Last dose 02/28/23 Aspirin  Instructions: n/a  Instructed to hold Jardiance  and Entresto  for 3 days. Last dose 03/02/2023  NPO after midnight  COVID TEST- n/a   Anesthesia review: Yes. Previous CABG in 1989, CHF, A. Fib, NSTEMI, CVA (no deficits) and COPD  Patient denies shortness of breath, fever, cough and chest pain at PAT appointment. Pt denies any respiratory illness/infection in the last two months.   All instructions explained to the patient, with a verbal understanding of the material. Patient agrees to go over the instructions while at home for a better understanding. Patient also instructed to self quarantine after being tested for COVID-19. The opportunity to ask questions was provided.

## 2023-03-04 NOTE — Progress Notes (Signed)
 VASCULAR LAB    Carotid duplex has been performed.  See CV proc for preliminary results.   Tokiko Diefenderfer, RVT 03/04/2023, 1:48 PM

## 2023-03-05 MED ORDER — PHENYLEPHRINE HCL-NACL 20-0.9 MG/250ML-% IV SOLN
30.0000 ug/min | INTRAVENOUS | Status: AC
  Administered 2023-03-06: 160 ug via INTRAVENOUS
  Filled 2023-03-05: qty 250

## 2023-03-05 MED ORDER — TRANEXAMIC ACID (OHS) PUMP PRIME SOLUTION
2.0000 mg/kg | INTRAVENOUS | Status: DC
  Filled 2023-03-05: qty 1.21

## 2023-03-05 MED ORDER — PLASMA-LYTE A IV SOLN
INTRAVENOUS | Status: DC
  Filled 2023-03-05: qty 2.5

## 2023-03-05 MED ORDER — HEPARIN 30,000 UNITS/1000 ML (OHS) CELLSAVER SOLUTION
Status: DC
  Filled 2023-03-05: qty 1000

## 2023-03-05 MED ORDER — MANNITOL 20 % IV SOLN
INTRAVENOUS | Status: DC
  Filled 2023-03-05: qty 13

## 2023-03-05 MED ORDER — TRANEXAMIC ACID 1000 MG/10ML IV SOLN
1.5000 mg/kg/h | INTRAVENOUS | Status: AC
  Administered 2023-03-06: 1.5 mg/kg/h via INTRAVENOUS
  Filled 2023-03-05: qty 25

## 2023-03-05 MED ORDER — CEFAZOLIN SODIUM-DEXTROSE 2-4 GM/100ML-% IV SOLN
2.0000 g | INTRAVENOUS | Status: AC
  Administered 2023-03-06 (×2): 2 g via INTRAVENOUS
  Filled 2023-03-05: qty 100

## 2023-03-05 MED ORDER — EPINEPHRINE HCL 5 MG/250ML IV SOLN IN NS
0.0000 ug/min | INTRAVENOUS | Status: DC
  Filled 2023-03-05: qty 250

## 2023-03-05 MED ORDER — VANCOMYCIN HCL 1250 MG/250ML IV SOLN
1250.0000 mg | INTRAVENOUS | Status: AC
  Administered 2023-03-06: 1250 mg via INTRAVENOUS
  Filled 2023-03-05: qty 250

## 2023-03-05 MED ORDER — CEFAZOLIN SODIUM-DEXTROSE 2-4 GM/100ML-% IV SOLN
2.0000 g | INTRAVENOUS | Status: AC
  Administered 2023-03-06: 2 g via INTRAVENOUS
  Filled 2023-03-05: qty 100

## 2023-03-05 MED ORDER — POTASSIUM CHLORIDE 2 MEQ/ML IV SOLN
80.0000 meq | INTRAVENOUS | Status: DC
  Filled 2023-03-05: qty 40

## 2023-03-05 MED ORDER — NITROGLYCERIN IN D5W 200-5 MCG/ML-% IV SOLN
2.0000 ug/min | INTRAVENOUS | Status: DC
  Filled 2023-03-05: qty 250

## 2023-03-05 MED ORDER — DEXMEDETOMIDINE HCL IN NACL 400 MCG/100ML IV SOLN
0.1000 ug/kg/h | INTRAVENOUS | Status: AC
  Administered 2023-03-06: .4 ug/kg/h via INTRAVENOUS
  Filled 2023-03-05: qty 100

## 2023-03-05 MED ORDER — NOREPINEPHRINE 4 MG/250ML-% IV SOLN
0.0000 ug/min | INTRAVENOUS | Status: AC
  Administered 2023-03-06: 2 ug/min via INTRAVENOUS
  Filled 2023-03-05: qty 250

## 2023-03-05 MED ORDER — TRANEXAMIC ACID (OHS) BOLUS VIA INFUSION
15.0000 mg/kg | INTRAVENOUS | Status: AC
  Administered 2023-03-06: 904.5 mg via INTRAVENOUS
  Filled 2023-03-05: qty 905

## 2023-03-05 MED ORDER — INSULIN REGULAR(HUMAN) IN NACL 100-0.9 UT/100ML-% IV SOLN
INTRAVENOUS | Status: AC
  Administered 2023-03-06: 1.8 [IU]/h via INTRAVENOUS
  Filled 2023-03-05: qty 100

## 2023-03-05 MED ORDER — MILRINONE LACTATE IN DEXTROSE 20-5 MG/100ML-% IV SOLN
0.3000 ug/kg/min | INTRAVENOUS | Status: DC
  Filled 2023-03-05: qty 100

## 2023-03-05 MED ORDER — VANCOMYCIN HCL 1000 MG IV SOLR
INTRAVENOUS | Status: DC
  Filled 2023-03-05: qty 20

## 2023-03-05 NOTE — Anesthesia Preprocedure Evaluation (Addendum)
 Anesthesia Evaluation  Patient identified by MRN, date of birth, ID band Patient awake    Reviewed: Allergy & Precautions, H&P , NPO status , Patient's Chart, lab work & pertinent test results  Airway Mallampati: II  TM Distance: >3 FB Neck ROM: Full    Dental  (+) Edentulous Upper, Edentulous Lower   Pulmonary COPD, former smoker   Pulmonary exam normal breath sounds clear to auscultation       Cardiovascular hypertension, + CAD, + Past MI and + CABG  Normal cardiovascular exam+ dysrhythmias Atrial Fibrillation + Valvular Problems/Murmurs MR  Rhythm:Regular Rate:Normal  TEE:  IMPRESSIONS     1. Septal hypokinesis . Left ventricular ejection fraction, by  estimation, is 45 to 50%. The left ventricle has mildly decreased  function. The left ventricular internal cavity size was mildly dilated.  Left ventricular diastolic function could not be  evaluated.   2. Right ventricular systolic function is normal. The right ventricular  size is normal.   3. Left atrial size was severely dilated. No left atrial/left atrial  appendage thrombus was detected.   4. Right atrial size was mildly dilated.   5. Thickened rheumatic valve with severe central MR ERO 0.52 cm2 RV 101  cc Mean gradient across valve 5 peak 13 mmHg at HR 65 bpm. The mitral  valve is rheumatic. Severe mitral valve regurgitation. Mild mitral  stenosis.   6. Tricuspid valve regurgitation is moderate.   7. The aortic valve is tricuspid. There is moderate calcification of the  aortic valve. There is moderate thickening of the aortic valve. Aortic  valve regurgitation is mild. Aortic valve sclerosis is present, with no  evidence of aortic valve stenosis.     Neuro/Psych CVA  negative psych ROS   GI/Hepatic negative GI ROS, Neg liver ROS,,,  Endo/Other  negative endocrine ROS    Renal/GU Renal disease  negative genitourinary   Musculoskeletal negative  musculoskeletal ROS (+)    Abdominal   Peds negative pediatric ROS (+)  Hematology negative hematology ROS (+)   Anesthesia Other Findings   Reproductive/Obstetrics negative OB ROS                             Anesthesia Physical Anesthesia Plan  ASA: 4  Anesthesia Plan: General   Post-op Pain Management:    Induction: Intravenous  PONV Risk Score and Plan: 3 and Treatment may vary due to age or medical condition  Airway Management Planned: Oral ETT  Additional Equipment: Arterial line, CVP, PA Cath, 3D TEE and Ultrasound Guidance Line Placement  Intra-op Plan:   Post-operative Plan: Extubation in OR  Informed Consent: I have reviewed the patients History and Physical, chart, labs and discussed the procedure including the risks, benefits and alternatives for the proposed anesthesia with the patient or authorized representative who has indicated his/her understanding and acceptance.     Dental advisory given  Plan Discussed with: CRNA  Anesthesia Plan Comments: (4 pRBC in room for sternotomy)        Anesthesia Quick Evaluation

## 2023-03-05 NOTE — H&P (Signed)
 [address]       [place] [address]             [phone]                                   Penny Mccullough [facility] Medical Record #[id number] Date of Birth: 1945-07-28   [name], MD Pcp, No   Chief Complaint: Rheumatic mitral regurgitation     History of Present Illness:     Pt known to me from previous consult back in June. Pt is 78 yo and has had a previous CABG (LIMA to LAD) which had been found to be occluded 2 yrs ago with an MI and had LAD stented. PT with afib on eliquis  and amiodarone  and was noted to have severe MR and an EF of 45%. She was relatively asymptomatic in June and was felt to continue with medical management. A stress test in July failed to achieve target heart rate and was inconclusive. She had repeat TEE this past September with ongoing rheumatic mitral regurgitation and unchanged EF. She was not a candidate for mteer and was felt best to undergo surgical MVR. She wanted to discuss the operation and timing. She has COPD and not on inhalers or O2. 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 This has not changed much  from June. She was told to cut her diuretics in half but yesterday started to notice lower ext edema             Past Medical History:  Diagnosis Date   CAD (coronary artery disease)     CHF (congestive heart failure) (HCC)     COPD (chronic obstructive pulmonary disease) (HCC)     CVA (cerebral vascular accident) (HCC)     HLD (hyperlipidemia)     Hypertension     MI (myocardial infarction) (HCC) 10/2020               Past Surgical History:  Procedure Laterality Date   CARDIOVERSION N/A 03/16/2021    Procedure: CARDIOVERSION;  Surgeon: Perla Evalene PARAS, MD;  Location: [facility] ORS;  Service: Cardiovascular;  Laterality: N/A;   CORONARY ARTERY BYPASS GRAFT   1989   CORONARY STENT INTERVENTION N/A 10/28/2020    Procedure:  CORONARY STENT INTERVENTION;  Surgeon: Florencio Cara BIRCH, MD;  Location: [facility] INVASIVE CV LAB;  Service: Cardiovascular;  Laterality: N/A;   LEFT HEART CATH AND CORONARY ANGIOGRAPHY N/A 10/28/2020    Procedure: LEFT HEART CATH AND CORONARY ANGIOGRAPHY;  Surgeon: Florencio Cara BIRCH, MD;  Location: [facility] INVASIVE CV LAB;  Service: Cardiovascular;  Laterality: N/A;   RIGHT/LEFT HEART CATH AND CORONARY ANGIOGRAPHY N/A 04/18/2022    Procedure: RIGHT/LEFT HEART CATH AND CORONARY ANGIOGRAPHY;  Surgeon: [name], MD;  Location: [facility] INVASIVE CV LAB;  Service: Cardiovascular;  Laterality: N/A;   TEE WITHOUT CARDIOVERSION N/A 05/01/2022    Procedure: TRANSESOPHAGEAL ECHOCARDIOGRAM (TEE);  Surgeon: Delford Maude BROCKS, MD;  Location: [facility] ENDOSCOPY;  Service: Cardiovascular;  Laterality: N/A;   TEE WITHOUT CARDIOVERSION N/A 10/26/2022    Procedure: TRANSESOPHAGEAL ECHOCARDIOGRAM;  Surgeon: Delford Maude BROCKS, MD;  Location: [facility] INVASIVE CV LAB;  Service: Cardiovascular;  Laterality: N/A;          Tobacco Use History  Social History  Tobacco Use  Smoking Status Former   Current packs/day: 0.00   Average packs/day: 1 pack/day for 30.0 years (30.0 ttl pk-yrs)   Types: Cigarettes   Start date: 02/20/1957   Quit date: 02/21/1987   Years since quitting: 35.8  Smokeless Tobacco Never      Social History       Substance and Sexual Activity  Alcohol Use Not Currently      Social History         Socioeconomic History   Marital status: Single      Spouse name: Not on file   Number of children: Not on file   Years of education: Not on file   Highest education level: Not on file  Occupational History   Not on file  Tobacco Use   Smoking status: Former      Current packs/day: 0.00      Average packs/day: 1 pack/day for 30.0 years (30.0 ttl pk-yrs)      Types: Cigarettes      Start date: 02/20/1957      Quit date: 02/21/1987      Years since quitting: 35.8   Smokeless tobacco: Never  Vaping Use    Vaping status: Never Used  Substance and Sexual Activity   Alcohol use: Not Currently   Drug use: Never   Sexual activity: Not Currently  Other Topics Concern   Not on file  Social History Narrative   Not on file    Social Determinants of Health    Financial Resource Strain: Not on file  Food Insecurity: Not on file  Transportation Needs: Not on file  Physical Activity: Not on file  Stress: Not on file  Social Connections: Not on file  Intimate Partner Violence: Not on file      Allergies      Allergies  Allergen Reactions   Codeine Nausea And Vomiting              Current Outpatient Medications  Medication Sig Dispense Refill   acetaminophen  (TYLENOL ) 500 MG tablet Take 500-1,000 mg by mouth every 6 (six) hours as needed (pain.).       albuterol  (VENTOLIN  HFA) 108 (90 Base) MCG/ACT inhaler Inhale 2 puffs into the lungs every 6 (six) hours as needed for wheezing or shortness of breath. 8 g 0   amiodarone  (PACERONE ) 200 MG tablet Take 1 tablet (200 mg total) by mouth daily. Take 1 tablet daily 90 tablet 3   apixaban  (ELIQUIS ) 5 MG TABS tablet Take 1 tablet (5 mg total) by mouth 2 (two) times daily. 180 tablet 3   atorvastatin  (LIPITOR ) 80 MG tablet Take 1 tablet (80 mg total) by mouth daily. 90 tablet 3   empagliflozin  (JARDIANCE ) 10 MG TABS tablet TAKE 1 TABLET BY MOUTH EVERY DAY BEFORE BREAKFAST 30 tablet 11   ezetimibe  (ZETIA ) 10 MG tablet Take 1 tablet (10 mg total) by mouth daily. 90 tablet 3   furosemide  (LASIX ) 20 MG tablet Take one tablet (20 mg) by mouth every day but may take an extra one tablet (20 mg) as needed.       metoprolol  succinate (TOPROL -XL) 25 MG 24 hr tablet Take 0.5 tablet (12.5 mg) by mouth once daily 45 tablet 3   nitroGLYCERIN  (NITROSTAT ) 0.4 MG SL tablet Place 1 tablet (0.4 mg total) under the tongue every 5 (five) minutes as needed for chest pain. 25 tablet 2   sacubitril-valsartan (ENTRESTO ) 24-26 MG Take 1 tablet by mouth 2 (two) times daily.  90  tablet 7   spironolactone  (ALDACTONE ) 25 MG tablet Take 0.5 tablets (12.5 mg total) by mouth daily. 45 tablet 3      No current facility-administered medications for this visit.               Family History  Problem Relation Age of Onset   Hypertension Mother                  Physical Exam:           Diagnostic Studies & Laboratory data: I have personally reviewed the following studies and agree with the findings   TTE (10/2022) IMPRESSIONS     1. Septal hypokinesis . Left ventricular ejection fraction, by  estimation, is 45 to 50%. The left ventricle has mildly decreased  function. The left ventricular internal cavity size was mildly dilated.  Left ventricular diastolic function could not be  evaluated.   2. Right ventricular systolic function is normal. The right ventricular  size is normal.   3. Left atrial size was severely dilated. No left atrial/left atrial  appendage thrombus was detected.   4. Right atrial size was mildly dilated.   5. Thickened rheumatic valve with severe central MR ERO 0.52 cm2 RV 101  cc Mean gradient across valve 5 peak 13 mmHg at HR 65 bpm. The mitral  valve is rheumatic. Severe mitral valve regurgitation. Mild mitral  stenosis.   6. Tricuspid valve regurgitation is moderate.   7. The aortic valve is tricuspid. There is moderate calcification of the  aortic valve. There is moderate thickening of the aortic valve. Aortic  valve regurgitation is mild. Aortic valve sclerosis is present, with no  evidence of aortic valve stenosis.   FINDINGS   Left Ventricle: Septal hypokinesis. Left ventricular ejection fraction,  by estimation, is 45 to 50%. The left ventricle has mildly decreased  function. The left ventricular internal cavity size was mildly dilated.  There is no left ventricular  hypertrophy. Left ventricular diastolic function could not be evaluated.   Right Ventricle: The right ventricular size is normal. Right vetricular   wall thickness was not assessed. Right ventricular systolic function is  normal.   Left Atrium: Left atrial size was severely dilated. No left atrial/left  atrial appendage thrombus was detected.   Right Atrium: Right atrial size was mildly dilated.   Pericardium: There is no evidence of pericardial effusion.   Mitral Valve: Thickened rheumatic valve with severe central MR ERO 0.52  cm2 RV 101 cc Mean gradient across valve 5 peak 13 mmHg at HR 65 bpm. The  mitral valve is rheumatic. There is moderate thickening of the mitral  valve leaflet(s). There is moderate  calcification of the mitral valve leaflet(s). Mild mitral annular  calcification. Severe mitral valve regurgitation. Mild mitral valve  stenosis. MV peak gradient, 13.4 mmHg. The mean mitral valve gradient is  5.0 mmHg.   Tricuspid Valve: The tricuspid valve is normal in structure. Tricuspid  valve regurgitation is moderate.   Aortic Valve: The aortic valve is tricuspid. There is moderate  calcification of the aortic valve. There is moderate thickening of the  aortic valve. Aortic valve regurgitation is mild. Aortic valve sclerosis  is present, with no evidence of aortic valve  stenosis. Aortic valve mean gradient measures 5.0 mmHg. Aortic valve peak  gradient measures 9.2 mmHg.   Pulmonic Valve: The pulmonic valve was normal in structure. Pulmonic valve  regurgitation is trivial.   Aorta: The aortic root is  normal in size and structure.   IAS/Shunts: No atrial level shunt detected by color flow Doppler.   Additional Comments: Spectral Doppler performed.   AORTIC VALVE  AV Vmax:      152.00 cm/s  AV Vmean:     103.000 cm/s  AV VTI:       0.389 m  AV Peak Grad: 9.2 mmHg  AV Mean Grad: 5.0 mmHg   MITRAL VALVE  MV Peak grad: 13.4 mmHg  MV Mean grad: 5.0 mmHg  MV Vmax:      1.83 m/s  MV Vmean:     104.0 cm/s  MR Peak grad:    112.8 mmHg  MR Mean grad:    78.0 mmHg  MR Vmax:         531.00 cm/s  MR Vmean:         429.0 cm/s  MR PISA:         9.05 cm  MR PISA Eff ROA: 52 mm  MR PISA Radius:  1.20 cm   Recent Radiology Findings:        Recent Lab Findings: Recent Labs       Lab Results  Component Value Date    WBC 7.1 10/15/2022    HGB 12.4 10/15/2022    HCT 37.6 10/15/2022    PLT 308 10/15/2022    GLUCOSE 78 10/15/2022    CHOL 156 06/18/2022    TRIG 133 06/18/2022    HDL 47 06/18/2022    LDLCALC 82 06/18/2022    ALT 25 07/19/2022    AST 22 07/19/2022    NA 139 10/15/2022    K 4.4 10/15/2022    CL 104 10/15/2022    CREATININE 1.25 (H) 10/15/2022    BUN 24 10/15/2022    CO2 24 10/15/2022    TSH 3.144 07/19/2022    INR 0.9 01/22/2021    HGBA1C 5.2 10/27/2020            Assessment / Plan:     78 yo female with 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 We discussed that her only option would be redo MVR with possible MAZE depending on scar tissue but that it would carry about a 4% mortality. We discussed the goals, risks, and recovery and she would like to proceed after the first of the year. She will need a repeat R heart cath and PFTs and carotid duplex. She will need to be off eliquis  for 5 days. Our office will call her for appt in January for surgery later.

## 2023-03-06 ENCOUNTER — Inpatient Hospital Stay (HOSPITAL_COMMUNITY)
Admission: RE | Admit: 2023-03-06 | Discharge: 2023-03-22 | DRG: 219 | Disposition: A | Discharge status: Home Health | Payer: 59 | Source: Ambulatory Visit | Attending: Thoracic Surgery (Cardiothoracic Vascular Surgery) | Admitting: Thoracic Surgery (Cardiothoracic Vascular Surgery)

## 2023-03-06 ENCOUNTER — Other Ambulatory Visit: Payer: Self-pay

## 2023-03-06 ENCOUNTER — Inpatient Hospital Stay (HOSPITAL_COMMUNITY): Payer: 59 | Admitting: Anesthesiology

## 2023-03-06 ENCOUNTER — Encounter (HOSPITAL_COMMUNITY)
Admission: RE | Disposition: A | Discharge status: Home / Self Care | Payer: Self-pay | Source: Ambulatory Visit | Attending: Thoracic Surgery (Cardiothoracic Vascular Surgery)

## 2023-03-06 ENCOUNTER — Inpatient Hospital Stay (HOSPITAL_COMMUNITY): Payer: 59

## 2023-03-06 ENCOUNTER — Encounter (HOSPITAL_COMMUNITY): Payer: Self-pay | Admitting: Thoracic Surgery (Cardiothoracic Vascular Surgery)

## 2023-03-06 DIAGNOSIS — I340 Nonrheumatic mitral (valve) insufficiency: Secondary | ICD-10-CM

## 2023-03-06 DIAGNOSIS — Z8249 Family history of ischemic heart disease and other diseases of the circulatory system: Secondary | ICD-10-CM

## 2023-03-06 DIAGNOSIS — I10 Essential (primary) hypertension: Secondary | ICD-10-CM

## 2023-03-06 DIAGNOSIS — Z87891 Personal history of nicotine dependence: Secondary | ICD-10-CM

## 2023-03-06 DIAGNOSIS — E119 Type 2 diabetes mellitus without complications: Secondary | ICD-10-CM | POA: Diagnosis present

## 2023-03-06 DIAGNOSIS — Z952 Presence of prosthetic heart valve: Secondary | ICD-10-CM | POA: Diagnosis not present

## 2023-03-06 DIAGNOSIS — I2510 Atherosclerotic heart disease of native coronary artery without angina pectoris: Secondary | ICD-10-CM | POA: Diagnosis present

## 2023-03-06 DIAGNOSIS — J810 Acute pulmonary edema: Secondary | ICD-10-CM | POA: Diagnosis not present

## 2023-03-06 DIAGNOSIS — I480 Paroxysmal atrial fibrillation: Secondary | ICD-10-CM | POA: Diagnosis present

## 2023-03-06 DIAGNOSIS — E785 Hyperlipidemia, unspecified: Secondary | ICD-10-CM | POA: Diagnosis present

## 2023-03-06 DIAGNOSIS — K660 Peritoneal adhesions (postprocedural) (postinfection): Secondary | ICD-10-CM | POA: Diagnosis not present

## 2023-03-06 DIAGNOSIS — J9811 Atelectasis: Secondary | ICD-10-CM | POA: Diagnosis not present

## 2023-03-06 DIAGNOSIS — I5082 Biventricular heart failure: Secondary | ICD-10-CM | POA: Diagnosis not present

## 2023-03-06 DIAGNOSIS — I081 Rheumatic disorders of both mitral and tricuspid valves: Secondary | ICD-10-CM | POA: Diagnosis present

## 2023-03-06 DIAGNOSIS — E876 Hypokalemia: Secondary | ICD-10-CM | POA: Diagnosis not present

## 2023-03-06 DIAGNOSIS — I5021 Acute systolic (congestive) heart failure: Secondary | ICD-10-CM

## 2023-03-06 DIAGNOSIS — Z79899 Other long term (current) drug therapy: Secondary | ICD-10-CM | POA: Diagnosis not present

## 2023-03-06 DIAGNOSIS — N390 Urinary tract infection, site not specified: Secondary | ICD-10-CM | POA: Diagnosis present

## 2023-03-06 DIAGNOSIS — I361 Nonrheumatic tricuspid (valve) insufficiency: Secondary | ICD-10-CM | POA: Diagnosis not present

## 2023-03-06 DIAGNOSIS — J449 Chronic obstructive pulmonary disease, unspecified: Secondary | ICD-10-CM | POA: Diagnosis present

## 2023-03-06 DIAGNOSIS — I051 Rheumatic mitral insufficiency: Secondary | ICD-10-CM | POA: Diagnosis not present

## 2023-03-06 DIAGNOSIS — Z7901 Long term (current) use of anticoagulants: Secondary | ICD-10-CM | POA: Diagnosis not present

## 2023-03-06 DIAGNOSIS — K5900 Constipation, unspecified: Secondary | ICD-10-CM

## 2023-03-06 DIAGNOSIS — I110 Hypertensive heart disease with heart failure: Secondary | ICD-10-CM | POA: Diagnosis present

## 2023-03-06 DIAGNOSIS — R570 Cardiogenic shock: Secondary | ICD-10-CM | POA: Diagnosis not present

## 2023-03-06 DIAGNOSIS — E871 Hypo-osmolality and hyponatremia: Secondary | ICD-10-CM | POA: Diagnosis not present

## 2023-03-06 DIAGNOSIS — Z9889 Other specified postprocedural states: Secondary | ICD-10-CM

## 2023-03-06 DIAGNOSIS — Z951 Presence of aortocoronary bypass graft: Secondary | ICD-10-CM

## 2023-03-06 DIAGNOSIS — N179 Acute kidney failure, unspecified: Secondary | ICD-10-CM | POA: Diagnosis not present

## 2023-03-06 DIAGNOSIS — J9601 Acute respiratory failure with hypoxia: Secondary | ICD-10-CM | POA: Diagnosis not present

## 2023-03-06 DIAGNOSIS — Z7982 Long term (current) use of aspirin: Secondary | ICD-10-CM

## 2023-03-06 DIAGNOSIS — D75839 Thrombocytosis, unspecified: Secondary | ICD-10-CM | POA: Diagnosis not present

## 2023-03-06 DIAGNOSIS — Z7984 Long term (current) use of oral hypoglycemic drugs: Secondary | ICD-10-CM

## 2023-03-06 DIAGNOSIS — I4891 Unspecified atrial fibrillation: Secondary | ICD-10-CM | POA: Diagnosis not present

## 2023-03-06 DIAGNOSIS — Z8673 Personal history of transient ischemic attack (TIA), and cerebral infarction without residual deficits: Secondary | ICD-10-CM

## 2023-03-06 DIAGNOSIS — D6959 Other secondary thrombocytopenia: Secondary | ICD-10-CM | POA: Diagnosis present

## 2023-03-06 DIAGNOSIS — I5023 Acute on chronic systolic (congestive) heart failure: Secondary | ICD-10-CM | POA: Diagnosis not present

## 2023-03-06 DIAGNOSIS — I5032 Chronic diastolic (congestive) heart failure: Secondary | ICD-10-CM | POA: Diagnosis present

## 2023-03-06 DIAGNOSIS — K567 Ileus, unspecified: Secondary | ICD-10-CM | POA: Diagnosis not present

## 2023-03-06 DIAGNOSIS — Z955 Presence of coronary angioplasty implant and graft: Secondary | ICD-10-CM

## 2023-03-06 DIAGNOSIS — Z01818 Encounter for other preprocedural examination: Secondary | ICD-10-CM

## 2023-03-06 DIAGNOSIS — I493 Ventricular premature depolarization: Secondary | ICD-10-CM | POA: Diagnosis not present

## 2023-03-06 DIAGNOSIS — I252 Old myocardial infarction: Secondary | ICD-10-CM

## 2023-03-06 DIAGNOSIS — H9190 Unspecified hearing loss, unspecified ear: Secondary | ICD-10-CM | POA: Diagnosis not present

## 2023-03-06 DIAGNOSIS — K5903 Drug induced constipation: Secondary | ICD-10-CM | POA: Diagnosis not present

## 2023-03-06 DIAGNOSIS — I509 Heart failure, unspecified: Secondary | ICD-10-CM | POA: Diagnosis not present

## 2023-03-06 HISTORY — PX: TRICUSPID VALVE REPLACEMENT: SHX816

## 2023-03-06 HISTORY — PX: TEE WITHOUT CARDIOVERSION: SHX5443

## 2023-03-06 HISTORY — PX: MITRAL VALVE REPLACEMENT: SHX147

## 2023-03-06 LAB — POCT I-STAT 7, (LYTES, BLD GAS, ICA,H+H)
Acid-Base Excess: 1 mmol/L (ref 0.0–2.0)
Acid-base deficit: 2 mmol/L (ref 0.0–2.0)
Acid-base deficit: 2 mmol/L (ref 0.0–2.0)
Acid-base deficit: 2 mmol/L (ref 0.0–2.0)
Acid-base deficit: 5 mmol/L — ABNORMAL HIGH (ref 0.0–2.0)
Acid-base deficit: 1 mmol/L (ref 0.0–2.0)
Acid-base deficit: 4 mmol/L — ABNORMAL HIGH (ref 0.0–2.0)
Acid-base deficit: 4 mmol/L — ABNORMAL HIGH (ref 0.0–2.0)
Bicarbonate: 23.7 mmol/L (ref 20.0–28.0)
Bicarbonate: 24 mmol/L (ref 20.0–28.0)
Bicarbonate: 24.8 mmol/L (ref 20.0–28.0)
Bicarbonate: 22.9 mmol/L (ref 20.0–28.0)
Bicarbonate: 21.6 mmol/L (ref 20.0–28.0)
Bicarbonate: 23.5 mmol/L (ref 20.0–28.0)
Bicarbonate: 22.3 mmol/L (ref 20.0–28.0)
Bicarbonate: 22 mmol/L (ref 20.0–28.0)
Calcium, Ion: 1.13 mmol/L — ABNORMAL LOW (ref 1.15–1.40)
Calcium, Ion: 0.88 mmol/L — CL (ref 1.15–1.40)
Calcium, Ion: 0.99 mmol/L — ABNORMAL LOW (ref 1.15–1.40)
Calcium, Ion: 1.03 mmol/L — ABNORMAL LOW (ref 1.15–1.40)
Calcium, Ion: 1.19 mmol/L (ref 1.15–1.40)
Calcium, Ion: 1.19 mmol/L (ref 1.15–1.40)
Calcium, Ion: 1.24 mmol/L (ref 1.15–1.40)
Calcium, Ion: 1.17 mmol/L (ref 1.15–1.40)
HCT: 34 % — ABNORMAL LOW (ref 36.0–46.0)
HCT: 26 % — ABNORMAL LOW (ref 36.0–46.0)
HCT: 25 % — ABNORMAL LOW (ref 36.0–46.0)
HCT: 26 % — ABNORMAL LOW (ref 36.0–46.0)
HCT: 28 % — ABNORMAL LOW (ref 36.0–46.0)
HCT: 31 % — ABNORMAL LOW (ref 36.0–46.0)
HCT: 31 % — ABNORMAL LOW (ref 36.0–46.0)
HCT: 28 % — ABNORMAL LOW (ref 36.0–46.0)
Hemoglobin: 11.6 g/dL — ABNORMAL LOW (ref 12.0–15.0)
Hemoglobin: 8.8 g/dL — ABNORMAL LOW (ref 12.0–15.0)
Hemoglobin: 8.5 g/dL — ABNORMAL LOW (ref 12.0–15.0)
Hemoglobin: 8.8 g/dL — ABNORMAL LOW (ref 12.0–15.0)
Hemoglobin: 9.5 g/dL — ABNORMAL LOW (ref 12.0–15.0)
Hemoglobin: 10.5 g/dL — ABNORMAL LOW (ref 12.0–15.0)
Hemoglobin: 10.5 g/dL — ABNORMAL LOW (ref 12.0–15.0)
Hemoglobin: 9.5 g/dL — ABNORMAL LOW (ref 12.0–15.0)
O2 Saturation: 100 %
O2 Saturation: 100 %
O2 Saturation: 100 %
O2 Saturation: 100 %
O2 Saturation: 92 %
O2 Saturation: 98 %
O2 Saturation: 93 %
O2 Saturation: 97 %
Patient temperature: 35.5
Patient temperature: 36.6
Patient temperature: 36
Patient temperature: 36.1
Potassium: 3.7 mmol/L (ref 3.5–5.1)
Potassium: 4.5 mmol/L (ref 3.5–5.1)
Potassium: 4.5 mmol/L (ref 3.5–5.1)
Potassium: 4.2 mmol/L (ref 3.5–5.1)
Potassium: 3.4 mmol/L — ABNORMAL LOW (ref 3.5–5.1)
Potassium: 3.7 mmol/L (ref 3.5–5.1)
Potassium: 3.2 mmol/L — ABNORMAL LOW (ref 3.5–5.1)
Potassium: 3.2 mmol/L — ABNORMAL LOW (ref 3.5–5.1)
Sodium: 138 mmol/L (ref 135–145)
Sodium: 139 mmol/L (ref 135–145)
Sodium: 137 mmol/L (ref 135–145)
Sodium: 139 mmol/L (ref 135–145)
Sodium: 140 mmol/L (ref 135–145)
Sodium: 141 mmol/L (ref 135–145)
Sodium: 142 mmol/L (ref 135–145)
Sodium: 141 mmol/L (ref 135–145)
TCO2: 25 mmol/L (ref 22–32)
TCO2: 25 mmol/L (ref 22–32)
TCO2: 26 mmol/L (ref 22–32)
TCO2: 24 mmol/L (ref 22–32)
TCO2: 23 mmol/L (ref 22–32)
TCO2: 25 mmol/L (ref 22–32)
TCO2: 24 mmol/L (ref 22–32)
TCO2: 23 mmol/L (ref 22–32)
pCO2 arterial: 43.4 mm[Hg] (ref 32–48)
pCO2 arterial: 45.6 mm[Hg] (ref 32–48)
pCO2 arterial: 35 mm[Hg] (ref 32–48)
pCO2 arterial: 40 mm[Hg] (ref 32–48)
pCO2 arterial: 43.5 mm[Hg] (ref 32–48)
pCO2 arterial: 38.8 mm[Hg] (ref 32–48)
pCO2 arterial: 45.1 mm[Hg] (ref 32–48)
pCO2 arterial: 39.7 mm[Hg] (ref 32–48)
pH, Arterial: 7.344 — ABNORMAL LOW (ref 7.35–7.45)
pH, Arterial: 7.33 — ABNORMAL LOW (ref 7.35–7.45)
pH, Arterial: 7.459 — ABNORMAL HIGH (ref 7.35–7.45)
pH, Arterial: 7.366 (ref 7.35–7.45)
pH, Arterial: 7.295 — ABNORMAL LOW (ref 7.35–7.45)
pH, Arterial: 7.388 (ref 7.35–7.45)
pH, Arterial: 7.297 — ABNORMAL LOW (ref 7.35–7.45)
pH, Arterial: 7.347 — ABNORMAL LOW (ref 7.35–7.45)
pO2, Arterial: 385 mm[Hg] — ABNORMAL HIGH (ref 83–108)
pO2, Arterial: 342 mm[Hg] — ABNORMAL HIGH (ref 83–108)
pO2, Arterial: 427 mm[Hg] — ABNORMAL HIGH (ref 83–108)
pO2, Arterial: 389 mm[Hg] — ABNORMAL HIGH (ref 83–108)
pO2, Arterial: 66 mm[Hg] — ABNORMAL LOW (ref 83–108)
pO2, Arterial: 107 mm[Hg] (ref 83–108)
pO2, Arterial: 70 mm[Hg] — ABNORMAL LOW (ref 83–108)
pO2, Arterial: 88 mm[Hg] (ref 83–108)

## 2023-03-06 LAB — POCT I-STAT, CHEM 8
BUN: 23 mg/dL (ref 8–23)
BUN: 24 mg/dL — ABNORMAL HIGH (ref 8–23)
BUN: 22 mg/dL (ref 8–23)
BUN: 24 mg/dL — ABNORMAL HIGH (ref 8–23)
BUN: 23 mg/dL (ref 8–23)
Calcium, Ion: 1.14 mmol/L — ABNORMAL LOW (ref 1.15–1.40)
Calcium, Ion: 1.15 mmol/L (ref 1.15–1.40)
Calcium, Ion: 1 mmol/L — ABNORMAL LOW (ref 1.15–1.40)
Calcium, Ion: 0.98 mmol/L — ABNORMAL LOW (ref 1.15–1.40)
Calcium, Ion: 1.03 mmol/L — ABNORMAL LOW (ref 1.15–1.40)
Chloride: 102 mmol/L (ref 98–111)
Chloride: 104 mmol/L (ref 98–111)
Chloride: 99 mmol/L (ref 98–111)
Chloride: 102 mmol/L (ref 98–111)
Chloride: 104 mmol/L (ref 98–111)
Creatinine, Ser: 1.3 mg/dL — ABNORMAL HIGH (ref 0.44–1.00)
Creatinine, Ser: 1.4 mg/dL — ABNORMAL HIGH (ref 0.44–1.00)
Creatinine, Ser: 1.2 mg/dL — ABNORMAL HIGH (ref 0.44–1.00)
Creatinine, Ser: 1.2 mg/dL — ABNORMAL HIGH (ref 0.44–1.00)
Creatinine, Ser: 1.1 mg/dL — ABNORMAL HIGH (ref 0.44–1.00)
Glucose, Bld: 138 mg/dL — ABNORMAL HIGH (ref 70–99)
Glucose, Bld: 169 mg/dL — ABNORMAL HIGH (ref 70–99)
Glucose, Bld: 140 mg/dL — ABNORMAL HIGH (ref 70–99)
Glucose, Bld: 124 mg/dL — ABNORMAL HIGH (ref 70–99)
Glucose, Bld: 158 mg/dL — ABNORMAL HIGH (ref 70–99)
HCT: 34 % — ABNORMAL LOW (ref 36.0–46.0)
HCT: 36 % (ref 36.0–46.0)
HCT: 25 % — ABNORMAL LOW (ref 36.0–46.0)
HCT: 24 % — ABNORMAL LOW (ref 36.0–46.0)
HCT: 26 % — ABNORMAL LOW (ref 36.0–46.0)
Hemoglobin: 11.6 g/dL — ABNORMAL LOW (ref 12.0–15.0)
Hemoglobin: 12.2 g/dL (ref 12.0–15.0)
Hemoglobin: 8.5 g/dL — ABNORMAL LOW (ref 12.0–15.0)
Hemoglobin: 8.2 g/dL — ABNORMAL LOW (ref 12.0–15.0)
Hemoglobin: 8.8 g/dL — ABNORMAL LOW (ref 12.0–15.0)
Potassium: 3.8 mmol/L (ref 3.5–5.1)
Potassium: 4.2 mmol/L (ref 3.5–5.1)
Potassium: 4.5 mmol/L (ref 3.5–5.1)
Potassium: 5 mmol/L (ref 3.5–5.1)
Potassium: 4.2 mmol/L (ref 3.5–5.1)
Sodium: 138 mmol/L (ref 135–145)
Sodium: 139 mmol/L (ref 135–145)
Sodium: 135 mmol/L (ref 135–145)
Sodium: 136 mmol/L (ref 135–145)
Sodium: 139 mmol/L (ref 135–145)
TCO2: 26 mmol/L (ref 22–32)
TCO2: 26 mmol/L (ref 22–32)
TCO2: 27 mmol/L (ref 22–32)
TCO2: 28 mmol/L (ref 22–32)
TCO2: 26 mmol/L (ref 22–32)

## 2023-03-06 LAB — PREPARE RBC (CROSSMATCH)

## 2023-03-06 LAB — GLUCOSE, CAPILLARY
Glucose-Capillary: 142 mg/dL — ABNORMAL HIGH (ref 70–99)
Glucose-Capillary: 126 mg/dL — ABNORMAL HIGH (ref 70–99)
Glucose-Capillary: 152 mg/dL — ABNORMAL HIGH (ref 70–99)
Glucose-Capillary: 142 mg/dL — ABNORMAL HIGH (ref 70–99)
Glucose-Capillary: 139 mg/dL — ABNORMAL HIGH (ref 70–99)
Glucose-Capillary: 141 mg/dL — ABNORMAL HIGH (ref 70–99)
Glucose-Capillary: 164 mg/dL — ABNORMAL HIGH (ref 70–99)
Glucose-Capillary: 135 mg/dL — ABNORMAL HIGH (ref 70–99)
Glucose-Capillary: 183 mg/dL — ABNORMAL HIGH (ref 70–99)
Glucose-Capillary: 175 mg/dL — ABNORMAL HIGH (ref 70–99)

## 2023-03-06 LAB — ECHO INTRAOPERATIVE TEE
Height: 61 in
Weight: 2144 [oz_av]

## 2023-03-06 LAB — POCT I-STAT EG7
Acid-Base Excess: 1 mmol/L (ref 0.0–2.0)
Bicarbonate: 26.7 mmol/L (ref 20.0–28.0)
Calcium, Ion: 0.95 mmol/L — ABNORMAL LOW (ref 1.15–1.40)
HCT: 26 % — ABNORMAL LOW (ref 36.0–46.0)
Hemoglobin: 8.8 g/dL — ABNORMAL LOW (ref 12.0–15.0)
O2 Saturation: 78 %
Potassium: 4.1 mmol/L (ref 3.5–5.1)
Sodium: 141 mmol/L (ref 135–145)
TCO2: 28 mmol/L (ref 22–32)
pCO2, Ven: 49.6 mm[Hg] (ref 44–60)
pH, Ven: 7.339 (ref 7.25–7.43)
pO2, Ven: 45 mm[Hg] (ref 32–45)

## 2023-03-06 LAB — CBC
HCT: 29.3 % — ABNORMAL LOW (ref 36.0–46.0)
HCT: 31.8 % — ABNORMAL LOW (ref 36.0–46.0)
Hemoglobin: 9.3 g/dL — ABNORMAL LOW (ref 12.0–15.0)
Hemoglobin: 10.5 g/dL — ABNORMAL LOW (ref 12.0–15.0)
MCH: 30.6 pg (ref 26.0–34.0)
MCH: 31.3 pg (ref 26.0–34.0)
MCHC: 31.7 g/dL (ref 30.0–36.0)
MCHC: 33 g/dL (ref 30.0–36.0)
MCV: 96.4 fL (ref 80.0–100.0)
MCV: 94.9 fL (ref 80.0–100.0)
Platelets: 124 10*3/uL — ABNORMAL LOW (ref 150–400)
Platelets: 178 10*3/uL (ref 150–400)
RBC: 3.04 MIL/uL — ABNORMAL LOW (ref 3.87–5.11)
RBC: 3.35 MIL/uL — ABNORMAL LOW (ref 3.87–5.11)
RDW: 13.8 % (ref 11.5–15.5)
RDW: 13.9 % (ref 11.5–15.5)
WBC: 17.2 10*3/uL — ABNORMAL HIGH (ref 4.0–10.5)
WBC: 16.4 10*3/uL — ABNORMAL HIGH (ref 4.0–10.5)
nRBC: 0 % (ref 0.0–0.2)
nRBC: 0 % (ref 0.0–0.2)

## 2023-03-06 LAB — PROTIME-INR
INR: 1.5 — ABNORMAL HIGH (ref 0.8–1.2)
Prothrombin Time: 18.3 s — ABNORMAL HIGH (ref 11.4–15.2)

## 2023-03-06 LAB — COOXEMETRY PANEL
Carboxyhemoglobin: 2.3 % — ABNORMAL HIGH (ref 0.5–1.5)
Methemoglobin: <0.7 % (ref 0.0–1.5)
O2 Saturation: 61.5 %
Total hemoglobin: 10.4 g/dL — ABNORMAL LOW (ref 12.0–16.0)

## 2023-03-06 LAB — ABO/RH: ABO/RH(D): A NEG

## 2023-03-06 LAB — BASIC METABOLIC PANEL
Anion gap: 9 (ref 5–15)
BUN: 19 mg/dL (ref 8–23)
CO2: 22 mmol/L (ref 22–32)
Calcium: 8.7 mg/dL — ABNORMAL LOW (ref 8.9–10.3)
Chloride: 108 mmol/L (ref 98–111)
Creatinine, Ser: 1.3 mg/dL — ABNORMAL HIGH (ref 0.44–1.00)
GFR, Estimated: 43 mL/min — ABNORMAL LOW (ref >60)
Glucose, Bld: 155 mg/dL — ABNORMAL HIGH (ref 70–99)
Potassium: 3.4 mmol/L — ABNORMAL LOW (ref 3.5–5.1)
Sodium: 139 mmol/L (ref 135–145)

## 2023-03-06 LAB — APTT: aPTT: 35 s (ref 24–36)

## 2023-03-06 LAB — HEMOGLOBIN AND HEMATOCRIT, BLOOD
HCT: 26 % — ABNORMAL LOW (ref 36.0–46.0)
Hemoglobin: 8.5 g/dL — ABNORMAL LOW (ref 12.0–15.0)

## 2023-03-06 LAB — PLATELET COUNT: Platelets: 166 10*3/uL (ref 150–400)

## 2023-03-06 LAB — MAGNESIUM: Magnesium: 3.8 mg/dL — ABNORMAL HIGH (ref 1.7–2.4)

## 2023-03-06 SURGERY — REDO STERNOTOMY
Anesthesia: General | Site: Chest

## 2023-03-06 MED ORDER — CALCIUM GLUCONATE-NACL 2-0.675 GM/100ML-% IV SOLN
2.0000 g | Freq: Once | INTRAVENOUS | Status: AC
  Administered 2023-03-06: 2000 mg via INTRAVENOUS
  Filled 2023-03-06: qty 100

## 2023-03-06 MED ORDER — ALBUMIN HUMAN 5 % IV SOLN: 250.0000 mL | INTRAVENOUS | Status: DC | PRN

## 2023-03-06 MED ORDER — ORAL CARE MOUTH RINSE
15.0000 mL | OROMUCOSAL | Status: DC
  Administered 2023-03-07 – 2023-03-18 (×43): 15 mL via OROMUCOSAL

## 2023-03-06 MED ORDER — SODIUM CHLORIDE 0.9% FLUSH
3.0000 mL | Freq: Two times a day (BID) | INTRAVENOUS | Status: DC
  Administered 2023-03-06: 10 mL via INTRAVENOUS
  Administered 2023-03-06: 5 mL via INTRAVENOUS

## 2023-03-06 MED ORDER — ASPIRIN 81 MG PO CHEW
324.0000 mg | CHEWABLE_TABLET | Freq: Once | ORAL | Status: AC
  Administered 2023-03-06: 324 mg via ORAL
  Filled 2023-03-06: qty 4

## 2023-03-06 MED ORDER — LIDOCAINE 2% (20 MG/ML) 5 ML SYRINGE
INTRAMUSCULAR | Status: AC
  Filled 2023-03-06: qty 5

## 2023-03-06 MED ORDER — LACTATED RINGERS IV SOLN: INTRAVENOUS | Status: DC

## 2023-03-06 MED ORDER — SODIUM CHLORIDE 0.9% FLUSH: 3.0000 mL | INTRAVENOUS | Status: DC | PRN

## 2023-03-06 MED ORDER — LIDOCAINE HCL (CARDIAC) PF 100 MG/5ML IV SOSY
PREFILLED_SYRINGE | INTRAVENOUS | Status: DC | PRN
  Administered 2023-03-06: 100 mg via INTRATRACHEAL

## 2023-03-06 MED ORDER — PANTOPRAZOLE SODIUM 40 MG PO TBEC
40.0000 mg | DELAYED_RELEASE_TABLET | Freq: Every day | ORAL | Status: DC
  Administered 2023-03-08 – 2023-03-17 (×10): 40 mg via ORAL
  Filled 2023-03-06 (×11): qty 1

## 2023-03-06 MED ORDER — INSULIN REGULAR(HUMAN) IN NACL 100-0.9 UT/100ML-% IV SOLN
INTRAVENOUS | Status: DC
  Administered 2023-03-06: 1.6 [IU]/h via INTRAVENOUS

## 2023-03-06 MED ORDER — CHLORHEXIDINE GLUCONATE 4 % EX SOLN: 30.0000 mL | CUTANEOUS | Status: DC

## 2023-03-06 MED ORDER — SODIUM CHLORIDE 0.9% IV SOLUTION: Freq: Once | INTRAVENOUS | Status: DC

## 2023-03-06 MED ORDER — VANCOMYCIN HCL 1000 MG IV SOLR
INTRAVENOUS | Status: DC | PRN
  Administered 2023-03-06: 1000 mL

## 2023-03-06 MED ORDER — SODIUM CHLORIDE 0.9 % IV SOLN
250.0000 mL | INTRAVENOUS | Status: DC
  Administered 2023-03-07: 250 mL via INTRAVENOUS

## 2023-03-06 MED ORDER — SUGAMMADEX SODIUM 200 MG/2ML IV SOLN
INTRAVENOUS | Status: DC | PRN
  Administered 2023-03-06: 200 mg via INTRAVENOUS

## 2023-03-06 MED ORDER — ONDANSETRON HCL 4 MG/2ML IJ SOLN
4.0000 mg | Freq: Four times a day (QID) | INTRAMUSCULAR | Status: DC | PRN
  Administered 2023-03-08 – 2023-03-11 (×6): 4 mg via INTRAVENOUS
  Filled 2023-03-06 (×6): qty 2

## 2023-03-06 MED ORDER — CEFAZOLIN SODIUM-DEXTROSE 2-4 GM/100ML-% IV SOLN: 2.0000 g | Freq: Three times a day (TID) | INTRAVENOUS | Status: DC

## 2023-03-06 MED ORDER — METOPROLOL TARTRATE 5 MG/5ML IV SOLN: 2.5000 mg | INTRAVENOUS | Status: DC | PRN

## 2023-03-06 MED ORDER — DEXTROSE 50 % IV SOLN: 0.0000 mL | INTRAVENOUS | Status: DC | PRN

## 2023-03-06 MED ORDER — CHLORHEXIDINE GLUCONATE CLOTH 2 % EX PADS
6.0000 | MEDICATED_PAD | Freq: Every day | CUTANEOUS | Status: DC
  Administered 2023-03-06 – 2023-03-22 (×16): 6 via TOPICAL

## 2023-03-06 MED ORDER — DOCUSATE SODIUM 100 MG PO CAPS
200.0000 mg | ORAL_CAPSULE | Freq: Every day | ORAL | Status: DC
  Administered 2023-03-07 – 2023-03-20 (×11): 200 mg via ORAL
  Filled 2023-03-06 (×15): qty 2

## 2023-03-06 MED ORDER — SODIUM BICARBONATE 8.4 % IV SOLN
100.0000 meq | Freq: Once | INTRAVENOUS | Status: AC
  Administered 2023-03-06: 100 meq via INTRAVENOUS

## 2023-03-06 MED ORDER — METOCLOPRAMIDE HCL 5 MG/ML IJ SOLN
10.0000 mg | Freq: Four times a day (QID) | INTRAMUSCULAR | Status: AC
  Administered 2023-03-06 – 2023-03-07 (×6): 10 mg via INTRAVENOUS
  Filled 2023-03-06 (×6): qty 2

## 2023-03-06 MED ORDER — PROPOFOL 10 MG/ML IV BOLUS
INTRAVENOUS | Status: DC | PRN
  Administered 2023-03-06: 50 mg via INTRAVENOUS

## 2023-03-06 MED ORDER — HEPARIN SODIUM (PORCINE) 1000 UNIT/ML IJ SOLN
INTRAMUSCULAR | Status: AC
  Filled 2023-03-06: qty 1

## 2023-03-06 MED ORDER — AMIODARONE LOAD VIA INFUSION
150.0000 mg | Freq: Once | INTRAVENOUS | Status: AC
  Administered 2023-03-06: 150 mg via INTRAVENOUS

## 2023-03-06 MED ORDER — FENTANYL CITRATE (PF) 250 MCG/5ML IJ SOLN
INTRAMUSCULAR | Status: AC
  Filled 2023-03-06: qty 5

## 2023-03-06 MED ORDER — POTASSIUM CHLORIDE 10 MEQ/50ML IV SOLN
10.0000 meq | INTRAVENOUS | Status: AC
  Administered 2023-03-06 – 2023-03-07 (×3): 10 meq via INTRAVENOUS
  Filled 2023-03-06 (×3): qty 50

## 2023-03-06 MED ORDER — MIDAZOLAM HCL 2 MG/2ML IJ SOLN
INTRAMUSCULAR | Status: AC
  Filled 2023-03-06: qty 2

## 2023-03-06 MED ORDER — ~~LOC~~ CARDIAC SURGERY, PATIENT & FAMILY EDUCATION
Freq: Once | Status: DC
  Filled 2023-03-06: qty 1

## 2023-03-06 MED ORDER — MIDAZOLAM HCL 2 MG/2ML IJ SOLN
2.0000 mg | INTRAMUSCULAR | Status: DC | PRN
  Administered 2023-03-06: 2 mg via INTRAVENOUS
  Filled 2023-03-06: qty 2

## 2023-03-06 MED ORDER — MORPHINE SULFATE (PF) 2 MG/ML IV SOLN
1.0000 mg | INTRAVENOUS | Status: DC | PRN
  Administered 2023-03-06 – 2023-03-07 (×4): 2 mg via INTRAVENOUS
  Filled 2023-03-06 (×4): qty 1

## 2023-03-06 MED ORDER — ROCURONIUM BROMIDE 10 MG/ML (PF) SYRINGE
PREFILLED_SYRINGE | INTRAVENOUS | Status: AC
  Filled 2023-03-06: qty 10

## 2023-03-06 MED ORDER — LACTATED RINGERS IV SOLN
INTRAVENOUS | Status: DC
  Administered 2023-03-06: 10 mL/h via INTRAVENOUS

## 2023-03-06 MED ORDER — POTASSIUM CHLORIDE 10 MEQ/50ML IV SOLN
10.0000 meq | INTRAVENOUS | Status: AC
  Administered 2023-03-06 (×3): 10 meq via INTRAVENOUS

## 2023-03-06 MED ORDER — EPINEPHRINE HCL 5 MG/250ML IV SOLN IN NS
0.0000 ug/min | INTRAVENOUS | Status: DC
  Filled 2023-03-06: qty 250

## 2023-03-06 MED ORDER — PROPOFOL 10 MG/ML IV BOLUS
INTRAVENOUS | Status: AC
  Filled 2023-03-06: qty 20

## 2023-03-06 MED ORDER — AMIODARONE HCL 200 MG PO TABS
200.0000 mg | ORAL_TABLET | Freq: Every day | ORAL | Status: DC
  Administered 2023-03-07: 200 mg via ORAL
  Filled 2023-03-06: qty 1

## 2023-03-06 MED ORDER — PROTAMINE SULFATE 10 MG/ML IV SOLN
INTRAVENOUS | Status: DC | PRN
  Administered 2023-03-06: 250 mg via INTRAVENOUS

## 2023-03-06 MED ORDER — ASPIRIN 325 MG PO TBEC: 325.0000 mg | DELAYED_RELEASE_TABLET | Freq: Every day | ORAL | Status: DC

## 2023-03-06 MED ORDER — ACETAMINOPHEN 160 MG/5ML PO SOLN
650.0000 mg | Freq: Once | ORAL | Status: AC
Start: 2023-03-06 — End: 2023-03-06
  Administered 2023-03-06: 650 mg
  Filled 2023-03-06: qty 20.3

## 2023-03-06 MED ORDER — AMIODARONE HCL IN DEXTROSE 360-4.14 MG/200ML-% IV SOLN
30.0000 mg/h | INTRAVENOUS | Status: DC
  Administered 2023-03-07: 30 mg/h via INTRAVENOUS
  Filled 2023-03-06: qty 200

## 2023-03-06 MED ORDER — ACETAMINOPHEN 160 MG/5ML PO SOLN
1000.0000 mg | Freq: Four times a day (QID) | ORAL | Status: AC
  Administered 2023-03-07: 1000 mg
  Filled 2023-03-06: qty 40.6

## 2023-03-06 MED ORDER — ORAL CARE MOUTH RINSE: 15.0000 mL | OROMUCOSAL | Status: DC | PRN

## 2023-03-06 MED ORDER — OXYCODONE HCL 5 MG PO TABS
5.0000 mg | ORAL_TABLET | ORAL | Status: DC | PRN
  Administered 2023-03-07 – 2023-03-14 (×7): 10 mg via ORAL
  Administered 2023-03-14: 5 mg via ORAL
  Filled 2023-03-06: qty 1
  Filled 2023-03-06 (×8): qty 2

## 2023-03-06 MED ORDER — PHENYLEPHRINE HCL-NACL 20-0.9 MG/250ML-% IV SOLN
0.0000 ug/min | INTRAVENOUS | Status: DC
Start: 2023-03-06 — End: 2023-03-06

## 2023-03-06 MED ORDER — MIDAZOLAM HCL (PF) 5 MG/ML IJ SOLN
INTRAMUSCULAR | Status: DC | PRN
  Administered 2023-03-06: 1 mg via INTRAVENOUS
  Administered 2023-03-06: 2 mg via INTRAVENOUS

## 2023-03-06 MED ORDER — PHENYLEPHRINE 80 MCG/ML (10ML) SYRINGE FOR IV PUSH (FOR BLOOD PRESSURE SUPPORT)
PREFILLED_SYRINGE | INTRAVENOUS | Status: AC
  Filled 2023-03-06: qty 10

## 2023-03-06 MED ORDER — BISACODYL 10 MG RE SUPP
10.0000 mg | Freq: Every day | RECTAL | Status: DC
  Filled 2023-03-06: qty 1

## 2023-03-06 MED ORDER — DEXMEDETOMIDINE HCL IN NACL 400 MCG/100ML IV SOLN
0.0000 ug/kg/h | INTRAVENOUS | Status: DC
  Administered 2023-03-06: 0.7 ug/kg/h via INTRAVENOUS

## 2023-03-06 MED ORDER — ONDANSETRON HCL 4 MG/2ML IJ SOLN
INTRAMUSCULAR | Status: DC | PRN
  Administered 2023-03-06: 8 mg via INTRAVENOUS

## 2023-03-06 MED ORDER — AMIODARONE HCL IN DEXTROSE 360-4.14 MG/200ML-% IV SOLN
INTRAVENOUS | Status: AC
  Administered 2023-03-06: 150 mg/h
  Filled 2023-03-06: qty 200

## 2023-03-06 MED ORDER — METOPROLOL TARTRATE 12.5 MG HALF TABLET: 12.5000 mg | ORAL_TABLET | Freq: Two times a day (BID) | ORAL | Status: DC

## 2023-03-06 MED ORDER — HEPARIN SODIUM (PORCINE) 1000 UNIT/ML IJ SOLN
INTRAMUSCULAR | Status: DC | PRN
  Administered 2023-03-06: 25000 [IU] via INTRAVENOUS

## 2023-03-06 MED ORDER — PROTAMINE SULFATE 10 MG/ML IV SOLN
INTRAVENOUS | Status: AC
  Filled 2023-03-06: qty 25

## 2023-03-06 MED ORDER — LACTATED RINGERS IV SOLN: INTRAVENOUS | Status: DC | PRN

## 2023-03-06 MED ORDER — PANTOPRAZOLE SODIUM 40 MG IV SOLR
40.0000 mg | Freq: Every day | INTRAVENOUS | Status: AC
  Administered 2023-03-06 – 2023-03-07 (×2): 40 mg via INTRAVENOUS
  Filled 2023-03-06 (×2): qty 10

## 2023-03-06 MED ORDER — ASPIRIN 81 MG PO CHEW: 324.0000 mg | CHEWABLE_TABLET | Freq: Every day | ORAL | Status: DC

## 2023-03-06 MED ORDER — PLASMA-LYTE A IV SOLN: INTRAVENOUS | Status: DC | PRN

## 2023-03-06 MED ORDER — ROCURONIUM BROMIDE 10 MG/ML (PF) SYRINGE
PREFILLED_SYRINGE | INTRAVENOUS | Status: DC | PRN
  Administered 2023-03-06: 100 mg via INTRAVENOUS
  Administered 2023-03-06: 50 mg via INTRAVENOUS

## 2023-03-06 MED ORDER — METOPROLOL TARTRATE 12.5 MG HALF TABLET
12.5000 mg | ORAL_TABLET | Freq: Once | ORAL | Status: DC
  Filled 2023-03-06: qty 1

## 2023-03-06 MED ORDER — TRAMADOL HCL 50 MG PO TABS
50.0000 mg | ORAL_TABLET | ORAL | Status: DC | PRN
Start: 2023-03-06 — End: 2023-03-22
  Administered 2023-03-07 – 2023-03-09 (×3): 100 mg via ORAL
  Administered 2023-03-12 – 2023-03-19 (×12): 50 mg via ORAL
  Administered 2023-03-20 (×2): 100 mg via ORAL
  Administered 2023-03-20 – 2023-03-22 (×6): 50 mg via ORAL
  Filled 2023-03-06 (×2): qty 1
  Filled 2023-03-06: qty 2
  Filled 2023-03-06 (×4): qty 1
  Filled 2023-03-06: qty 2
  Filled 2023-03-06 (×5): qty 1
  Filled 2023-03-06 (×2): qty 2
  Filled 2023-03-06: qty 1
  Filled 2023-03-06: qty 2
  Filled 2023-03-06 (×7): qty 1

## 2023-03-06 MED ORDER — CALCIUM CHLORIDE 10 % IV SOLN
INTRAVENOUS | Status: DC | PRN
  Administered 2023-03-06: 200 mg via INTRAVENOUS
  Administered 2023-03-06: 100 mg via INTRAVENOUS
  Administered 2023-03-06 (×3): 200 mg via INTRAVENOUS
  Administered 2023-03-06: 100 mg via INTRAVENOUS

## 2023-03-06 MED ORDER — CHLORHEXIDINE GLUCONATE 0.12 % MT SOLN
15.0000 mL | OROMUCOSAL | Status: AC
  Administered 2023-03-06: 15 mL via OROMUCOSAL
  Filled 2023-03-06: qty 15

## 2023-03-06 MED ORDER — MAGNESIUM SULFATE 4 GM/100ML IV SOLN
4.0000 g | Freq: Once | INTRAVENOUS | Status: AC
  Administered 2023-03-06: 4 g via INTRAVENOUS
  Filled 2023-03-06: qty 100

## 2023-03-06 MED ORDER — EZETIMIBE 10 MG PO TABS
10.0000 mg | ORAL_TABLET | Freq: Every day | ORAL | Status: DC
Start: 2023-03-07 — End: 2023-03-22
  Administered 2023-03-07 – 2023-03-22 (×15): 10 mg via ORAL
  Filled 2023-03-06 (×15): qty 1

## 2023-03-06 MED ORDER — MILRINONE LACTATE IN DEXTROSE 20-5 MG/100ML-% IV SOLN
0.2500 ug/kg/min | INTRAVENOUS | Status: DC
  Administered 2023-03-06 – 2023-03-08 (×3): 0.25 ug/kg/min via INTRAVENOUS
  Filled 2023-03-06 (×3): qty 100

## 2023-03-06 MED ORDER — NOREPINEPHRINE 4 MG/250ML-% IV SOLN: 0.0000 ug/min | INTRAVENOUS | Status: DC

## 2023-03-06 MED ORDER — VANCOMYCIN HCL IN DEXTROSE 1-5 GM/200ML-% IV SOLN
1000.0000 mg | Freq: Once | INTRAVENOUS | Status: AC
  Administered 2023-03-06: 1000 mg via INTRAVENOUS
  Filled 2023-03-06: qty 200

## 2023-03-06 MED ORDER — SODIUM CHLORIDE 0.9 % IV SOLN
2.0000 g | INTRAVENOUS | Status: DC
  Administered 2023-03-06 – 2023-03-07 (×2): 2 g via INTRAVENOUS
  Filled 2023-03-06 (×2): qty 20

## 2023-03-06 MED ORDER — METOPROLOL TARTRATE 25 MG/10 ML ORAL SUSPENSION: 12.5000 mg | Freq: Two times a day (BID) | ORAL | Status: DC

## 2023-03-06 MED ORDER — CHLORHEXIDINE GLUCONATE 0.12 % MT SOLN
15.0000 mL | Freq: Once | OROMUCOSAL | Status: AC
  Administered 2023-03-06: 15 mL via OROMUCOSAL
  Filled 2023-03-06: qty 15

## 2023-03-06 MED ORDER — NITROGLYCERIN 0.2 MG/ML ON CALL CATH LAB
INTRAVENOUS | Status: DC | PRN
  Administered 2023-03-06 (×2): 20 ug via INTRAVENOUS
  Administered 2023-03-06 (×2): 10 ug via INTRAVENOUS

## 2023-03-06 MED ORDER — SODIUM CHLORIDE 0.45 % IV SOLN: INTRAVENOUS | Status: DC | PRN

## 2023-03-06 MED ORDER — 0.9 % SODIUM CHLORIDE (POUR BTL) OPTIME
TOPICAL | Status: DC | PRN
  Administered 2023-03-06: 6000 mL

## 2023-03-06 MED ORDER — EPINEPHRINE HCL 5 MG/250ML IV SOLN IN NS
INTRAVENOUS | Status: DC | PRN
  Administered 2023-03-06: 2 ug/min via INTRAVENOUS

## 2023-03-06 MED ORDER — SODIUM CHLORIDE 0.9% FLUSH: 3.0000 mL | Freq: Two times a day (BID) | INTRAVENOUS | Status: DC

## 2023-03-06 MED ORDER — FENTANYL CITRATE (PF) 250 MCG/5ML IJ SOLN
INTRAMUSCULAR | Status: DC | PRN
  Administered 2023-03-06 (×3): 50 ug via INTRAVENOUS
  Administered 2023-03-06: 150 ug via INTRAVENOUS

## 2023-03-06 MED ORDER — NICARDIPINE HCL IN NACL 20-0.86 MG/200ML-% IV SOLN
0.0000 mg/h | INTRAVENOUS | Status: DC
Start: 2023-03-06 — End: 2023-03-07

## 2023-03-06 MED ORDER — ALBUMIN HUMAN 5 % IV SOLN: INTRAVENOUS | Status: DC | PRN

## 2023-03-06 MED ORDER — BISACODYL 5 MG PO TBEC
10.0000 mg | DELAYED_RELEASE_TABLET | Freq: Every day | ORAL | Status: DC
  Administered 2023-03-07 – 2023-03-18 (×9): 10 mg via ORAL
  Filled 2023-03-06 (×12): qty 2

## 2023-03-06 MED ORDER — PHENYLEPHRINE HCL (PRESSORS) 10 MG/ML IV SOLN
INTRAVENOUS | Status: DC | PRN
  Administered 2023-03-06: 80 ug via INTRAVENOUS

## 2023-03-06 MED ORDER — AMIODARONE HCL IN DEXTROSE 360-4.14 MG/200ML-% IV SOLN
60.0000 mg/h | INTRAVENOUS | Status: DC
  Administered 2023-03-06 (×2): 60 mg/h via INTRAVENOUS

## 2023-03-06 MED ORDER — SODIUM CHLORIDE 0.9 % IV SOLN: INTRAVENOUS | Status: DC | PRN

## 2023-03-06 MED ORDER — ATORVASTATIN CALCIUM 80 MG PO TABS
80.0000 mg | ORAL_TABLET | Freq: Every day | ORAL | Status: DC
  Administered 2023-03-07 – 2023-03-22 (×15): 80 mg via ORAL
  Filled 2023-03-06 (×15): qty 1

## 2023-03-06 MED ORDER — ACETAMINOPHEN 500 MG PO TABS
1000.0000 mg | ORAL_TABLET | Freq: Four times a day (QID) | ORAL | Status: AC
  Administered 2023-03-07 – 2023-03-11 (×14): 1000 mg via ORAL
  Filled 2023-03-06 (×16): qty 2

## 2023-03-06 SURGICAL SUPPLY — 84 items
ADAPTER CARDIO PERF ANTE/RETRO (ADAPTER) IMPLANT
ANTIFOG SOL W/FOAM PAD STRL (MISCELLANEOUS) ×3
BAG DECANTER FOR FLEXI CONT (MISCELLANEOUS) ×3 IMPLANT
BLADE CLIPPER SURG (BLADE) ×3 IMPLANT
BLADE CORE FAN STRYKER (BLADE) ×3 IMPLANT
BLADE STERNUM SYSTEM 6 (BLADE) ×3 IMPLANT
BLADE SURG 11 STRL SS (BLADE) IMPLANT
BLADE SURG 15 STRL LF DISP TIS (BLADE) IMPLANT
CANISTER SUCT 3000ML PPV (MISCELLANEOUS) ×3 IMPLANT
CANN PRFSN 3/8XCNCT ST RT ANG (MISCELLANEOUS) ×3
CANNULA NON VENT 20FR 12 (CANNULA) ×3 IMPLANT
CANNULA NON VENT 22FR 12 (CANNULA) ×3 IMPLANT
CANNULA PRFSN 3/8XCNCT RT ANG (MISCELLANEOUS) IMPLANT
CANNULA SUMP PERICARDIAL (CANNULA) ×3 IMPLANT
CANNULA VRC MALB SNGL STG 36FR (MISCELLANEOUS) IMPLANT
CATH HEART VENT LEFT (CATHETERS) ×3 IMPLANT
CATH RETROPLEGIA CORONARY 14FR (CATHETERS) IMPLANT
CATH ROBINSON RED A/P 18FR (CATHETERS) ×9 IMPLANT
CATH THOR STR 32F SOFT 20 RADI (CATHETERS) ×3 IMPLANT
CATH THORACIC 28FR RT ANG (CATHETERS) ×3 IMPLANT
CLAMP SUTURE YELLOW 5 PAIRS (MISCELLANEOUS) ×3 IMPLANT
CLIP TI MEDIUM 24 (CLIP) IMPLANT
CLIP TI WIDE RED SMALL 24 (CLIP) IMPLANT
CONNECTOR 1/2X3/8X1/2 3WAY (MISCELLANEOUS) IMPLANT
CONTAINER PROTECT SURGISLUSH (MISCELLANEOUS) ×6 IMPLANT
DEVICE SUT CK QUICK LOAD MINI (Prosthesis & Implant Heart) IMPLANT
DRAPE CV SPLIT W-CLR ANES SCRN (DRAPES) ×3 IMPLANT
DRAPE INCISE IOBAN 66X45 STRL (DRAPES) ×3 IMPLANT
DRAPE PERI GROIN 82X75IN TIB (DRAPES) ×3 IMPLANT
DRSG AQUACEL AG ADV 3.5X10 (GAUZE/BANDAGES/DRESSINGS) ×3 IMPLANT
ELECT CAUTERY BLADE 6.4 (BLADE) ×3 IMPLANT
ELECT REM PT RETURN 9FT ADLT (ELECTROSURGICAL) ×6
ELECTRODE REM PT RTRN 9FT ADLT (ELECTROSURGICAL) ×6 IMPLANT
FELT TEFLON 1X6 (MISCELLANEOUS) ×3 IMPLANT
GAUZE 4X4 16PLY ~~LOC~~+RFID DBL (SPONGE) ×3 IMPLANT
GAUZE SPONGE 4X4 12PLY STRL (GAUZE/BANDAGES/DRESSINGS) ×3 IMPLANT
GAUZE XEROFORM 1X8 LF (GAUZE/BANDAGES/DRESSINGS) IMPLANT
GLOVE SS BIOGEL STRL SZ 6 (GLOVE) IMPLANT
GLOVE SS BIOGEL STRL SZ 6.5 (GLOVE) IMPLANT
GLOVE SS BIOGEL STRL SZ 7.5 (GLOVE) ×3 IMPLANT
GOWN STRL REUS W/ TWL LRG LVL3 (GOWN DISPOSABLE) ×18 IMPLANT
HEMOSTAT SURGICEL 2X14 (HEMOSTASIS) IMPLANT
INSERT FOGARTY XLG (MISCELLANEOUS) ×3 IMPLANT
KIT BASIN OR (CUSTOM PROCEDURE TRAY) ×3 IMPLANT
KIT SUT CK MINI COMBO 4X17 (Prosthesis & Implant Heart) IMPLANT
KIT TURNOVER KIT B (KITS) ×3 IMPLANT
LINE VENT (MISCELLANEOUS) IMPLANT
NS IRRIG 1000ML POUR BTL (IV SOLUTION) ×18 IMPLANT
ORGANIZER SUTURE GABBAY-FRATER (MISCELLANEOUS) ×3 IMPLANT
PACK E OPEN HEART (SUTURE) ×3 IMPLANT
PACK OPEN HEART (CUSTOM PROCEDURE TRAY) ×3 IMPLANT
PAD ARMBOARD 7.5X6 YLW CONV (MISCELLANEOUS) ×6 IMPLANT
PAD ELECT DEFIB RADIOL ZOLL (MISCELLANEOUS) ×3 IMPLANT
PENCIL BUTTON HOLSTER BLD 10FT (ELECTRODE) ×3 IMPLANT
POSITIONER HEAD DONUT 9IN (MISCELLANEOUS) ×3 IMPLANT
RING TRICUSPID T30 (Prosthesis & Implant Heart) IMPLANT
SET MPS 3-ND DEL (MISCELLANEOUS) IMPLANT
SOLUTION ANTFG W/FOAM PAD STRL (MISCELLANEOUS) ×3 IMPLANT
SPONGE T-LAP 18X18 ~~LOC~~+RFID (SPONGE) ×12 IMPLANT
SPONGE T-LAP 4X18 ~~LOC~~+RFID (SPONGE) ×3 IMPLANT
SUT ETHIBOND 2 0 SH (SUTURE) IMPLANT
SUT ETHIBOND 3 0 SH 1 (SUTURE) IMPLANT
SUT MNCRL AB 4-0 PS2 18 (SUTURE) ×6 IMPLANT
SUT PROLENE 4 0 SH DA (SUTURE) ×9 IMPLANT
SUT PROLENE 4-0 RB1 .5 CRCL 36 (SUTURE) ×6 IMPLANT
SUT PROLENE 5 0 RB 2 (SUTURE) IMPLANT
SUT PROLENE 6 0 C 1 30 (SUTURE) IMPLANT
SUT STEEL 6MS V (SUTURE) ×3 IMPLANT
SUT STEEL SZ 6 DBL 3X14 BALL (SUTURE) ×6 IMPLANT
SUT VIC AB 0 CTX36XBRD ANTBCTR (SUTURE) ×6 IMPLANT
SUT VIC AB 2-0 CT1 TAPERPNT 27 (SUTURE) ×6 IMPLANT
SYSTEM SAHARA CHEST DRAIN ATS (WOUND CARE) ×3 IMPLANT
TAG SUTURE CLAMP YLW 5PR (MISCELLANEOUS) ×3
TAPE PAPER 2X10 WHT MICROPORE (GAUZE/BANDAGES/DRESSINGS) IMPLANT
TAPE PAPER 3X10 WHT MICROPORE (GAUZE/BANDAGES/DRESSINGS) IMPLANT
TOWEL GREEN STERILE (TOWEL DISPOSABLE) ×3 IMPLANT
TOWEL GREEN STERILE FF (TOWEL DISPOSABLE) ×3 IMPLANT
TRAY FOLEY SLVR 16FR TEMP STAT (SET/KITS/TRAYS/PACK) ×3 IMPLANT
TUBE CONNECTING 20X1/4 (TUBING) IMPLANT
UNDERPAD 30X36 HEAVY ABSORB (UNDERPADS AND DIAPERS) ×3 IMPLANT
VALVE MITRAL SZ 29 (Prosthesis & Implant Heart) IMPLANT
VENT LEFT HEART 12002 (CATHETERS) ×3
VRC MALLEABLE SINGLE STG 36FR (MISCELLANEOUS) ×3
WATER STERILE IRR 1000ML POUR (IV SOLUTION) ×6 IMPLANT

## 2023-03-06 NOTE — Procedures (Signed)
Extubation Procedure Note  Patient Details:   Name: Penny Mccullough DOB: 05/09/1945 MRN: [id number]   Airway Documentation:    Vent end date: 03/06/23 Vent end time: 1749   Evaluation  O2 sats: stable throughout Complications: No apparent complications Patient did tolerate procedure well. Bilateral Breath Sounds: Rhonchi, Inspiratory wheezes   Yes Pt was successfully extubated with no apparent complications.  Audible cuff leak heard prior to extubation and volume loss noted on ventilator- no signs of stridor at this time. Extubate per CCM.  Loletha Carrow 03/06/2023, 5:51 PM

## 2023-03-06 NOTE — Consult Note (Signed)
 NAME:  Penny Mccullough, MRN:  [id number], DOB:  [birth year], LOS: 0 ADMISSION DATE:  03/06/2023, CONSULTATION DATE: 03/06/2023 REFERRING MD: [name], CHIEF COMPLAINT: Status post mitral valve replacement/tricuspid valve repair  History of Present Illness:  78 year old female with COPD, ex-smoker coronary artery disease status post CABG and MI status post LAD stent, paroxysmal A-fib on Eliquis  and amiodarone  and rheumatic mitral valve disease who underwent workup for exertional dyspnea, noted to have severe mitral regurgitation and tricuspid regurgitation, today she underwent mitral valve replacement and tricuspid valve repair.  She was transferred to ICU postop, PCCM was consulted for help evaluation medical management  Pertinent  Medical History   Past Medical History:  Diagnosis Date   CAD (coronary artery disease)    CHF (congestive heart failure) (HCC)    COPD (chronic obstructive pulmonary disease) (HCC)    CVA (cerebral vascular accident) (HCC)    Dysrhythmia    A.Fib   Heart murmur    History of kidney stones    HLD (hyperlipidemia)    Hypertension    MI (myocardial infarction) (HCC) 10/2020     Significant Hospital Events: Including procedures, antibiotic start and stop dates in addition to other pertinent events     Interim History / Subjective:  As above  Objective   Blood pressure 99/79, pulse 91, temperature 97.7 F (36.5 C), resp. rate 16, height 5\' 1"  (1.549 m), weight 60.8 kg, SpO2 98%. PAP: (19-48)/(-6-22) 28/12      Intake/Output Summary (Last 24 hours) at 03/06/2023 1245 Last data filed at 03/06/2023 1223 Gross per 24 hour  Intake 2429 ml  Output 800 ml  Net 1629 ml   Filed Weights   03/06/23 0644  Weight: 60.8 kg    Examination: General: Elderly female, orally intubated HEENT: [place]/AT, eyes anicteric.  ETT and OGT in place Neuro: Sedated, not following commands.  Eyes are closed.  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 Continue aspirin  and statin Chest tube management TCTS Continue to titrate Precedex  with RASS goal 0/-1 Continue pain control with tramadol , oxycodone  and morphine  Closely monitor chest tube output  Postop vasoplegia/cardiogenic shock Patient is currently on epinephrine  at 4 mics and Levophed  at 6 mics Continue to titrate vasopressor support with MAP goal 65  Paroxysmal A-fib on Eliquis  at home Continue amiodarone  Hold metoprolol  for now in the setting of shock Patient is currently paced rhythm  Acute respiratory insufficiency, postop Continue on protective ventilation VAP prevention bundle in place Rapid weaning protocol ordered is in place  Chronic biventricular HFrEF Monitor intake and output Patient echocardiogram showed EF 45% with septal wall motion abnormality preop, postop TEE showed EF around 40% with moderate reduced RV function GDMT once able to tolerate  Hypertension Holding antihypertensive for now, as patient is requiring vasopressor support  Hyperlipidemia Continue atorvastatin   Pre- Diabetes type 2 Patient hemoglobin A1c is 6.1 Currently on insulin  infusion, monitor fingerstick with goal 140-180  COPD, not in exacerbation Continue albuterol  nebs every 6 hours as needed  Acute urinary tract infection, POA Patient had urinalysis done on January 13 which showing leukocyte esterase positive Will try to add urine culture to prior  sample Started on IV ceftriaxone   Expected perioperative blood loss anemia Thrombocytopenia due to CPB Monitor H/H and PLT counts   Best Practice  (right click and "Reselect all SmartList Selections" daily)   Diet/type: NPO DVT prophylaxis: SCD GI prophylaxis: PPI Lines: Central line, Arterial Line, and yes and it is still needed Foley:  Yes, and it is still needed Code Status:  full code Last date of multidisciplinary goals of care discussion [Per primary team]   Labs   CBC: Recent Labs  Lab 03/04/23 1430 03/06/23 0845 03/06/23 1035 03/06/23 1043 03/06/23 1113 03/06/23 1142 03/06/23 1145  WBC 8.7  --   --   --   --   --   --   HGB 14.2   < > 8.5* 8.5* 8.2* 8.8* 8.8*  HCT 43.2   < > 26.0* 25.0* 24.0* 26.0* 26.0*  MCV 94.5  --   --   --   --   --   --   PLT 327  --  166  --   --   --   --    < > = values in this interval not displayed.    Basic Metabolic Panel: Recent Labs  Lab 03/04/23 1430 03/06/23 0845 03/06/23 0848 03/06/23 0926 03/06/23 0943 03/06/23 1013 03/06/23 1043 03/06/23 1113 03/06/23 1142 03/06/23 1145  NA 139 139   < > 138   < > 135 137 136 139 139  K 4.8 3.8   < > 4.2   < > 4.5 4.5 5.0 4.2 4.2  CL 103 102  --  104  --  99  --  102 104  --   CO2 26  --   --   --   --   --   --   --   --   --   GLUCOSE 106* 138*  --  169*  --  140*  --  124* 158*  --   BUN 24* 23  --  24*  --  22  --  24* 23  --   CREATININE 1.31* 1.30*  --  1.40*  --  1.20*  --  1.20* 1.10*  --   CALCIUM  9.4  --   --   --   --   --   --   --   --   --    < > = values in this interval not displayed.   GFR: Estimated Creatinine Clearance: 36.4 mL/min (A) (by C-G formula based on SCr of 1.1 mg/dL (H)). Recent Labs  Lab 03/04/23 1430  WBC 8.7    Liver Function Tests: Recent Labs  Lab 03/04/23 1430  AST 21  ALT 25  ALKPHOS 102  BILITOT 1.1  PROT 7.0  ALBUMIN  4.0   No results for input(s): "LIPASE", "AMYLASE" in the last 168 hours. No results for input(s): "AMMONIA" in the last 168 hours.  ABG    Component Value Date/Time   PHART 7.366 03/06/2023 1145   PCO2ART 40.0 03/06/2023 1145   PO2ART 389 (H)  03/06/2023 1145   HCO3 22.9 03/06/2023 1145   TCO2 24 03/06/2023 1145   ACIDBASEDEF 2.0 03/06/2023 1145   O2SAT 100 03/06/2023 1145     Coagulation Profile: Recent Labs  Lab 03/04/23 1430  INR 1.0    Cardiac Enzymes: No results for input(s): "CKTOTAL", "CKMB", "CKMBINDEX", "TROPONINI" in the last 168 hours.  HbA1C: Hgb A1c MFr Bld  Date/Time Value Ref Range Status  03/04/2023 02:30 PM 6.1 (H)  4.8 - 5.6 % Final    Comment:    (NOTE) Pre diabetes:          5.7%-6.4%  Diabetes:              >6.4%  Glycemic control for   <7.0% adults with diabetes   10/27/2020 09:58 PM 5.2 4.8 - 5.6 % Final    Comment:    (NOTE) Pre diabetes:          5.7%-6.4%  Diabetes:              >6.4%  Glycemic control for   <7.0% adults with diabetes     CBG: No results for input(s): "GLUCAP" in the last 168 hours.  Review of Systems:   Unable to obtain as patient is intubated and sedated  Past Medical History:  She,  has a past medical history of CAD (coronary artery disease), CHF (congestive heart failure) (HCC), COPD (chronic obstructive pulmonary disease) (HCC), CVA (cerebral vascular accident) (HCC), Dysrhythmia, Heart murmur, History of kidney stones, HLD (hyperlipidemia), Hypertension, and MI (myocardial infarction) (HCC) (10/2020).   Surgical History:   Past Surgical History:  Procedure Laterality Date   CARDIOVERSION N/A 03/16/2021   Procedure: CARDIOVERSION;  Surgeon: Devorah Fonder, MD;  Location: [facility] ORS;  Service: Cardiovascular;  Laterality: N/A;   CORONARY ARTERY BYPASS GRAFT  1989   CORONARY STENT INTERVENTION N/A 10/28/2020   Procedure: CORONARY STENT INTERVENTION;  Surgeon: Antonette Batters, MD;  Location: [facility] INVASIVE CV LAB;  Service: Cardiovascular;  Laterality: N/A;   LEFT HEART CATH AND CORONARY ANGIOGRAPHY N/A 10/28/2020   Procedure: LEFT HEART CATH AND CORONARY ANGIOGRAPHY;  Surgeon: Antonette Batters, MD;  Location: [facility] INVASIVE CV LAB;  Service:  Cardiovascular;  Laterality: N/A;   RIGHT HEART CATH Right 01/22/2023   Procedure: RIGHT HEART CATH;  Surgeon: Sammy Crisp, MD;  Location: [facility] INVASIVE CV LAB;  Service: Cardiovascular;  Laterality: Right;   RIGHT/LEFT HEART CATH AND CORONARY ANGIOGRAPHY N/A 04/18/2022   Procedure: RIGHT/LEFT HEART CATH AND CORONARY ANGIOGRAPHY;  Surgeon: Kyra Phy, MD;  Location: [facility] INVASIVE CV LAB;  Service: Cardiovascular;  Laterality: N/A;   TEE WITHOUT CARDIOVERSION N/A 05/01/2022   Procedure: TRANSESOPHAGEAL ECHOCARDIOGRAM (TEE);  Surgeon: Loyde Rule, MD;  Location: [facility] ENDOSCOPY;  Service: Cardiovascular;  Laterality: N/A;   TEE WITHOUT CARDIOVERSION N/A 10/26/2022   Procedure: TRANSESOPHAGEAL ECHOCARDIOGRAM;  Surgeon: Loyde Rule, MD;  Location: [facility] INVASIVE CV LAB;  Service: Cardiovascular;  Laterality: N/A;     Social History:   reports that she quit smoking about 36 years ago. Her smoking use included cigarettes. She started smoking about 66 years ago. She has a 30 pack-year smoking history. She has never used smokeless tobacco. She reports that she does not currently use alcohol. She reports that she does not use drugs.   Family History:  Her family history includes Hypertension in her mother.   Allergies Allergies  Allergen Reactions   Codeine Nausea And Vomiting     Home Medications  Prior to Admission medications   Medication Sig Start Date End Date Taking? Authorizing Provider  amiodarone  (PACERONE ) 200 MG tablet Take 1 tablet (200 mg total) by mouth daily. Take 1 tablet daily 07/03/22  Yes Gollan, Timothy J, MD  apixaban  (ELIQUIS ) 5 MG TABS tablet Take 1 tablet (5 mg total) by mouth 2 (two) times daily. 07/03/22  Yes Gollan, Timothy J, MD  atorvastatin  (LIPITOR ) 80 MG tablet Take 1 tablet (80 mg total) by  mouth daily. 07/03/22  Yes Gollan, Timothy J, MD  empagliflozin  (JARDIANCE ) 10 MG TABS tablet TAKE 1 TABLET BY MOUTH EVERY DAY BEFORE BREAKFAST 07/03/22  Yes Gollan, Timothy  J, MD  ezetimibe  (ZETIA ) 10 MG tablet Take 1 tablet (10 mg total) by mouth daily. 07/03/22 01/15/25 Yes Gollan, Timothy J, MD  furosemide  (LASIX ) 20 MG tablet Take 40 mg by mouth daily.   Yes [provider]  metoprolol  succinate (TOPROL -XL) 25 MG 24 hr tablet Take 0.5 tablet (12.5 mg) by mouth once daily 07/03/22  Yes Gollan, Timothy J, MD  sacubitril-valsartan (ENTRESTO ) 24-26 MG Take 1 tablet by mouth 2 (two) times daily. 07/19/22  Yes Darlis Eisenmenger, MD  spironolactone  (ALDACTONE ) 25 MG tablet Take 0.5 tablets (12.5 mg total) by mouth daily. 07/03/22  Yes Gollan, Timothy J, MD  acetaminophen  (TYLENOL ) 500 MG tablet Take 500-1,000 mg by mouth every 6 (six) hours as needed for moderate pain (pain score 4-6) or headache.    [provider]  albuterol  (VENTOLIN  HFA) 108 (90 Base) MCG/ACT inhaler Inhale 2 puffs into the lungs every 6 (six) hours as needed for wheezing or shortness of breath. 01/09/21   Gollan, Timothy J, MD  furosemide  (LASIX ) 40 MG tablet Take 1 tablet (40 mg total) by mouth daily. Patient not taking: Reported on 03/01/2023 01/22/23   End, Veryl Gottron, MD  nitroGLYCERIN  (NITROSTAT ) 0.4 MG SL tablet Place 1 tablet (0.4 mg total) under the tongue every 5 (five) minutes as needed for chest pain. 12/09/20   Furth, Cadence H, PA-C     Critical care time:      The patient is critically ill due to mitral valve regurgitation status post mitral valve replacement/acute respiratory insufficiency requiring titration of ventilator.  Critical care was necessary to treat or prevent imminent or life-threatening deterioration.  Critical care was time spent personally by me on the following activities: development of treatment plan with patient and/or surrogate as well as nursing, discussions with consultants, evaluation of patient's response to treatment, examination of patient, obtaining history from patient or surrogate, ordering and performing treatments and interventions, ordering  and review of laboratory studies, ordering and review of radiographic studies, pulse oximetry, re-evaluation of patient's condition and participation in multidisciplinary rounds.   During this encounter critical care time was devoted to patient care services described in this note for 35 minutes.    Trevor Fudge, MD [facility] See Amion for pager If no response to pager, please call [phone] until 7pm After 7pm, Please call E-link [phone]

## 2023-03-06 NOTE — Progress Notes (Signed)
 Dr Zaida Hertz notified of 1 hr post extubation abg on 28% VM.  Order received for pt to be placed on bipap.

## 2023-03-06 NOTE — Interval H&P Note (Signed)
 History and Physical Interval Note:  03/06/2023 6:53 AM  Penny Mccullough  has presented today for surgery, with the diagnosis of SEVERE MR AFIB.  The various methods of treatment have been discussed with the patient and family. After consideration of risks, benefits and other options for treatment, the patient has consented to  Procedure(s): REDO STERNOTOMY (N/A) MITRAL VALVE (MV) REPLACEMENT (N/A) possible MAZE (N/A) TRANSESOPHAGEAL ECHOCARDIOGRAM (N/A) as a surgical intervention.  The patient's history has been reviewed, patient examined, no change in status, stable for surgery.  I have reviewed the patient's chart and labs.  Questions were answered to the patient's satisfaction.     [name]

## 2023-03-06 NOTE — Transfer of Care (Signed)
 Immediate Anesthesia Transfer of Care Note  Patient: Penny Mccullough  Procedure(s) Performed: REDO STERNOTOMY MITRAL VALVE (MV) REPLACEMENT, USING 29 MM MEDTRONIC MOSAIC PORCINE HEART VALVE (Chest) TRANSESOPHAGEAL ECHOCARDIOGRAM TRICUSPID VALVE REPAIR, USING 30 MM MC3 ANNULOPLASTY RING  Patient Location: ICU  Anesthesia Type:General  Level of Consciousness: sedated and Patient remains intubated per anesthesia plan  Airway & Oxygen Therapy: Patient remains intubated per anesthesia plan and Patient placed on Ventilator (see vital sign flow sheet for setting)  Post-op Assessment: Report given to RN and Post -op Vital signs reviewed and stable  Post vital signs: Reviewed and stable  Last Vitals:  Vitals Value Taken Time  BP    Temp 35.5 C 03/06/23 1240  Pulse 79 03/06/23 1240  Resp 19 03/06/23 1240  SpO2 98 % 03/06/23 1240  Vitals shown include unfiled device data.  Last Pain:  Vitals:   03/06/23 0703  PainSc: 0-No pain         Complications: No notable events documented.

## 2023-03-06 NOTE — Brief Op Note (Signed)
03/06/2023  9:49 AM  PATIENT:  Penny Mccullough  78 y.o. female  PRE-OPERATIVE DIAGNOSIS:  SEVERE MITRAL REGURGITATION, ATRIAL FIBRILLATION  POST-OPERATIVE DIAGNOSIS:  SEVERE MITRAL REGURGITATION, ATRIAL FIBRILLATION  PROCEDURE:  Procedure(s): REDO STERNOTOMY (N/A) MITRAL VALVE (MV) REPLACEMENT, USING 29 MM MEDTRONIC MOSAIC PORCINE HEART VALVE (N/A) TRANSESOPHAGEAL ECHOCARDIOGRAM (N/A) TRICUSPID VALVE REPAIR, USING 30 MM MC3 ANNULOPLASTY RING  SURGEON:  Surgeons and Role:    * Eugenio Hoes, MD - Primary  PHYSICIAN ASSISTANT: Vincient Vanaman PA-C  ASSISTANTS: MALLORY HARDY RNFA   ANESTHESIA:   general  EBL:  481 ml  BLOOD ADMINISTERED:none  DRAINS:  MEDIASTINAL CHEST TUBES    LOCAL MEDICATIONS USED:  NONE  SPECIMEN:  Source of Specimen:  ANTERIOR MITRAL LEAFLETS   DISPOSITION OF SPECIMEN:  PATHOLOGY  COUNTS:  YES  TOURNIQUET:  * No tourniquets in log *  DICTATION: .Dragon Dictation  PLAN OF CARE: Admit to inpatient   PATIENT DISPOSITION:  ICU - intubated and hemodynamically stable.   Delay start of Pharmacological VTE agent (>24hrs) due to surgical blood loss or risk of bleeding: yes  COMPLICATIONS: NO KNOWN

## 2023-03-06 NOTE — Anesthesia Procedure Notes (Addendum)
 Arterial Line Insertion Start/End1/15/2025 7:40 AM, 03/06/2023 7:45 AM Performed by: Lethaniel Rave, MD, Robert Chimes, CRNA, CRNA  Patient location: Pre-op. Preanesthetic checklist: patient identified, IV checked, site marked, risks and benefits discussed, surgical consent, monitors and equipment checked, pre-op evaluation, timeout performed and anesthesia consent Lidocaine  1% used for infiltration Left, radial was placed Catheter size: 20 G Hand hygiene performed  and maximum sterile barriers used   Attempts: 1 Procedure performed without using ultrasound guided technique. Following insertion, dressing applied and Biopatch. Post procedure assessment: normal and unchanged  Patient tolerated the procedure well with no immediate complications. Additional procedure comments: Placed by Valeen Gartner, SRNA.

## 2023-03-06 NOTE — Hospital Course (Addendum)
Penny Pyo, MD Pcp, No   Chief Complaint: Rheumatic mitral regurgitation     History of Present Illness:     Pt known to me from previous consult back in June. Pt is 78 yo and has had a previous CABG (LIMA to LAD) which had been found to be occluded 2 yrs ago with an MI and had LAD stented. PT with afib on eliquis and amiodarone and was noted to have severe MR and an EF of 45%. She was relatively asymptomatic in June and was felt to continue with medical management. A stress test in July failed to achieve target heart rate and was inconclusive. She had repeat TEE this past September with ongoing rheumatic mitral regurgitation and unchanged EF. She was not a candidate for mteer and was felt best to undergo surgical MVR. She wanted to discuss the operation and timing. She has COPD and not on inhalers or O2. 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 This has not changed much  from June. She was told to cut her diuretics in half but yesterday started to notice lower ext edema.  Following full review of the patient and her relevant studies Dr. Leafy Ro recommended proceeding with redo sternotomy for mitral valve replacement and tricuspid valve repair.  Hospital course:  Patient was admitted electively and on 03/06/2023 taken the operating room at which time she underwent redo sternotomy with mitral valve replacement and tricuspid valve ring annuloplasty.  She tolerated the procedure well and was taken to the surgical intensive care unit in stable condition.  Postoperative hospital course:  The patient was weaned off the ventilator without difficulty but did initially require BiPAP for hypoxia.  The PCCM team was consulted to assist with ICU management.  She initially required epinephrine and milrinone for inotropic support this is being weaned off based on hemodynamic parameters.  She has chronic atrial fibrillation  remained in this postoperatively.  Plan is for Eliquis as anticoagulant therapy.  She initially was started on IV amiodarone due to significant ventricular ectopy and this has been transitioned to oral dosing.  She has postoperative volume overload as expected following cardiac surgery but not considered to be clinically significant and was diuresed accordingly.  She is noted to have a preoperative UA showing UTI and was treated postoperatively with antibiotics.  She has an expected acute blood loss anemia which is being trended and overall stable, not in the infusion threshold. Chest tubes were removed on 01/17 without complication. Coumadin was started on 01/18.  By postop day 3, all inotropic support had been weaned off.  Co. ox was stable at 58.  Diuresis was continued for interstitial pulmonary edema.  She developed nausea with mild abdominal distention and hypoactive bowel sounds consistent with ileus.  She was started on IV Reglan. By postop day 4, the nausea had improved and the abdominal exam was benign.  Penny Mccullough had a history of paroxysmal atrial fibrillation and was on Eliquis prior to admission.  She had recurrent A-fib postoperatively with RVR. Coumadin was started with the pharmacy team managing dosing while she was an inpatient.  She was also treated with both oral and intravenous amiodarone.  Pulmonary toilet was started since she continued to require 12L HFNC. She was diuresed for postoperative pulmonary edema likely caused by administration of fluids leading to acute exacerbation of chronic HFrEF. Echocardiogram on 1/21 showed EF 25-30%, no pericardial effusion  and valves were functioning appropriately. The advanced heart failure team was consulted for assistance with management.  Cooximetry was noted to be low and they placed her on milrinone.  Her Co-ox level improved and her diuretics were able to be discontinued.  INR reached goal on 2.3, she underwent DCCV on 1/28. She was felt stable for  transfer to the progressive unit.  She is continue to progress and is maintaining a sinus bradycardia in the 50s.  The advanced heart failure team have maximized her current therapy/GDMT as able.  Her most recent Co. oximetry is 65.  Oxygen has been weaned and she maintains good saturations.  Incisions are healing well without evidence of infection.  She is tolerating diet and having bowel movements.  She is having gradually increasing activities using standard protocols with cardiac rehab as well as the assistance of physical therapy.  Home health arrangements have been made.  Coumadin clinic for follow-up has been arranged.  Overall, at the time of discharge the patient is felt to be quite stable.

## 2023-03-06 NOTE — Progress Notes (Signed)
 Dr Honey Lusty notified of abnormal U/A result. No new orders at this time. Pt asymptomatic.

## 2023-03-06 NOTE — Anesthesia Procedure Notes (Signed)
 Central Venous Catheter Insertion Performed by: Lethaniel Rave, MD, anesthesiologist Start/End1/15/2025 8:00 AM, 03/06/2023 8:10 AM Patient location: Pre-op. Preanesthetic checklist: patient identified, IV checked, site marked, risks and benefits discussed, surgical consent, monitors and equipment checked, pre-op evaluation, timeout performed and anesthesia consent Lidocaine  1% used for infiltration and patient sedated Hand hygiene performed  and maximum sterile barriers used  Catheter size: 8.5 Fr PA cath was placed.MAC introducer Swan type:thermodilution PA Cath depth:50 Procedure performed using ultrasound guided technique. Ultrasound Notes:anatomy identified, needle tip was noted to be adjacent to the nerve/plexus identified, no ultrasound evidence of intravascular and/or intraneural injection and image(s) printed for medical record Attempts: 1 Following insertion, line sutured and dressing applied. Post procedure assessment: blood return through all ports, free fluid flow and no air  Patient tolerated the procedure well with no immediate complications.

## 2023-03-06 NOTE — Anesthesia Postprocedure Evaluation (Signed)
 Anesthesia Post Note  Patient: Penny Mccullough  Procedure(s) Performed: REDO STERNOTOMY MITRAL VALVE (MV) REPLACEMENT, USING 29 MM MEDTRONIC MOSAIC PORCINE HEART VALVE (Chest) TRANSESOPHAGEAL ECHOCARDIOGRAM TRICUSPID VALVE REPAIR, USING 30 MM MC3 ANNULOPLASTY RING     Patient location during evaluation: SICU Anesthesia Type: General Level of consciousness: sedated Pain management: pain level controlled Vital Signs Assessment: post-procedure vital signs reviewed and stable Respiratory status: patient remains intubated per anesthesia plan Cardiovascular status: stable Postop Assessment: no apparent nausea or vomiting Anesthetic complications: no   No notable events documented.  Last Vitals:  Vitals:   03/06/23 0808 03/06/23 0810  BP:  99/79  Pulse:    Resp: 17 16  Temp:    SpO2:      Last Pain:  Vitals:   03/06/23 0703  PainSc: 0-No pain                 Penny Mccullough

## 2023-03-06 NOTE — Op Note (Signed)
 CARDIOVASCULAR SURGERY OPERATIVE NOTE  03/06/2023 Penny Mccullough [id number]  Surgeon:  Wiley Hanger, MD  First Assistant: Matt Song [facility]                               An experienced assistant was required given the complexity of this surgery and the standard of surgical care. The assistant was needed for exposure, dissection, suctioning, retraction of delicate tissues and sutures, instrument exchange and for overall help during this procedure.     Preoperative Diagnosis:  Rheumatic severe mitral regurgitation                                             Tricuspid regurgitation                                              Atrial fibrillation                                              Previous CABG  Postoperative Diagnosis:  Same   Procedure: Redo Sternotomy Mitral valve replacement with a 29mm Mosaic Tissue Valve prosthesis Tricuspid ring annuloplasty with a  30mm MC3 edward tricuspid ring  Anesthesia:  General Endotracheal   Clinical History/Surgical Indication: 78 yo female with NYHA class 2 symptoms of severe rheumatic mitral regurgitation,slightly depressed LV function, sp CABG with occluded graft, patent LAD stent, afib on elquis and with evidence of moderate PHTN past right heart cath in February this year. We discussed that her only option would be redo MVR with possible MAZE depending on scar tissue but that it would carry about a 4% mortality. We discussed the goals, risks, and recovery and she would like to proceed after the first of the year.   Findings: Ejection fraction appeared approximately be 45%.  The mitral valve was rheumatic with scarring of the posterior leaflet to the papillary muscles and the left atrium was extremely large.  Tricuspid valve measured over 4.2 cm and with mild to moderate regurgitation was felt also to require annuloplasty.  Scar tissue was excessive over the lateral aspect of the heart which precluded performing an maze  procedure.  Preparation:  The patient was seen in the preoperative holding area and the correct patient, correct operation were confirmed with the patient after reviewing the medical record and catheterization. The consent was signed by me. Preoperative antibiotics were given. A pulmonary arterial line and radial arterial line were placed by the anesthesia team. The patient was taken back to the operating room and positioned supine on the operating room table. After being placed under general endotracheal anesthesia by the anesthesia team a foley catheter was placed. The neck, chest, abdomen, and both legs were prepped with betadine soap and solution and draped in the usual sterile manner. A surgical time-out was taken and the correct patient and operative procedure were confirmed with the nursing and anesthesia staff.  Operation: Repeat median sternotomy incision was then created and sick sternal wires were removed.  Utilizing the oscillating saw the sternotomy raised reopened and dense adhesions were taken down from the  posterior sternal table.  Following this the heart was dissected free of pericardial adhesions enough so that the mitral retractor was put in place and heparin  delivered. The aorta was cannulated with a 20 [place] Starns cannula and a single straight 36 French venous cannula was advanced from the right atrial appendage and directed towards the inferior vena cava.  An additional 68 French right angle cannula was placed in the superior vena cava and both cavae were controlled with cable tourniquets. An antegrade cardioplegia catheter was placed in the ascending aorta with adequate confirmation of anticoagulation cardiopulmonary bypass was instituted.  Aortic cross-clamp was placed and 1 L of cold blood [place] cardioplegia was delivered through the aortic root and reanimation dose was delivered just prior to cross-clamp removal. Left atrium was opened in the intra-atrial groove and  excellent exposure of the mitral valve was obtained and again the left atrial size was excessive.  The left atrial appendage was oversewn with a running 4-0 Prolene suture. The anterior leaflet of the mitral valve was resected and utilizing 2-0 [place] pledgeted sutures these were placed along the anterior annulus with the pledgets on the atrial surface.  The posterior annulus had preservation of the leaflets and chordal structures the additional Tevdek pledgeted sutures were placed again with the pledgets on the atrial surface. Utilizing a 29 mm Mosaic valve this was secured with the cor knot system. Left atrium was then closed over ventricular sump with 4-0 Prolene sutures. Cable tourniquets were then secured and the right atrium opened and utilizing 3-0 Ethibond sutures these were placed in the tricuspid annulus in anticipation of the MC3 ring which was sized 30 mm and secured with the cor knot system.  The right atrium was closed with a running 4 oh pledgeted Prolene suture. The patient in headdown position following delivery of the reanimation dose the aortic cross-clamp was removed and multiple de-airing maneuvers performed.  Ventricular and atrial pacing wires were placed about the inferior stab was and secured. The patient was then weaned from cardiopulmonary bypass on epinephrine  and norepinephrine  support and with adequate hemodynamics protamine  was delivered and patient was decannulated and sites oversewn were necessary.  Chest tubes were brought inferior stab was and secured with adequate hemostasis the sternum was reapproximated with interrupted stainless steel wires and the presternal subcutaneous tissue and skin were closed in multiple layers absorbable suture.  Sterile dressings were applied.

## 2023-03-06 NOTE — Anesthesia Procedure Notes (Signed)
 Procedure Name: Intubation Date/Time: 03/06/2023 8:32 AM  Performed by: Robert Chimes, CRNAPre-anesthesia Checklist: Patient identified, Emergency Drugs available, Suction available and Patient being monitored Patient Re-evaluated:Patient Re-evaluated prior to induction Oxygen Delivery Method: Circle system utilized Preoxygenation: Pre-oxygenation with 100% oxygen Induction Type: IV induction Ventilation: Mask ventilation without difficulty and Oral airway inserted - appropriate to patient size Laryngoscope Size: Mac and 3 Grade View: Grade I Tube type: Oral Tube size: 8.0 mm Number of attempts: 1 Airway Equipment and Method: Stylet and Oral airway Placement Confirmation: ETT inserted through vocal cords under direct vision, positive ETCO2 and breath sounds checked- equal and bilateral Secured at: 21 cm Tube secured with: Tape Dental Injury: Teeth and Oropharynx as per pre-operative assessment

## 2023-03-06 NOTE — Progress Notes (Signed)
 Pt placed on BIPAP via servo per MD order. Pt tolerating well at this time with stable vitals.

## 2023-03-07 ENCOUNTER — Encounter (HOSPITAL_COMMUNITY): Payer: Self-pay | Admitting: Thoracic Surgery (Cardiothoracic Vascular Surgery)

## 2023-03-07 ENCOUNTER — Inpatient Hospital Stay (HOSPITAL_COMMUNITY): Payer: 59

## 2023-03-07 DIAGNOSIS — R570 Cardiogenic shock: Secondary | ICD-10-CM | POA: Diagnosis not present

## 2023-03-07 LAB — POCT I-STAT 7, (LYTES, BLD GAS, ICA,H+H)
Acid-base deficit: 3 mmol/L — ABNORMAL HIGH (ref 0.0–2.0)
Bicarbonate: 22.2 mmol/L (ref 20.0–28.0)
Calcium, Ion: 1.2 mmol/L (ref 1.15–1.40)
HCT: 29 % — ABNORMAL LOW (ref 36.0–46.0)
Hemoglobin: 9.9 g/dL — ABNORMAL LOW (ref 12.0–15.0)
O2 Saturation: 89 %
Patient temperature: 37.5
Potassium: 3.8 mmol/L (ref 3.5–5.1)
Sodium: 138 mmol/L (ref 135–145)
TCO2: 23 mmol/L (ref 22–32)
pCO2 arterial: 38.3 mm[Hg] (ref 32–48)
pH, Arterial: 7.373 (ref 7.35–7.45)
pO2, Arterial: 60 mm[Hg] — ABNORMAL LOW (ref 83–108)

## 2023-03-07 LAB — GLUCOSE, CAPILLARY
Glucose-Capillary: 108 mg/dL — ABNORMAL HIGH (ref 70–99)
Glucose-Capillary: 119 mg/dL — ABNORMAL HIGH (ref 70–99)
Glucose-Capillary: 122 mg/dL — ABNORMAL HIGH (ref 70–99)
Glucose-Capillary: 127 mg/dL — ABNORMAL HIGH (ref 70–99)
Glucose-Capillary: 128 mg/dL — ABNORMAL HIGH (ref 70–99)
Glucose-Capillary: 129 mg/dL — ABNORMAL HIGH (ref 70–99)
Glucose-Capillary: 140 mg/dL — ABNORMAL HIGH (ref 70–99)
Glucose-Capillary: 150 mg/dL — ABNORMAL HIGH (ref 70–99)
Glucose-Capillary: 155 mg/dL — ABNORMAL HIGH (ref 70–99)
Glucose-Capillary: 155 mg/dL — ABNORMAL HIGH (ref 70–99)
Glucose-Capillary: 98 mg/dL (ref 70–99)

## 2023-03-07 LAB — MAGNESIUM
Magnesium: 2.7 mg/dL — ABNORMAL HIGH (ref 1.7–2.4)
Magnesium: 2.3 mg/dL (ref 1.7–2.4)

## 2023-03-07 LAB — BASIC METABOLIC PANEL
Anion gap: 10 (ref 5–15)
Anion gap: 9 (ref 5–15)
BUN: 18 mg/dL (ref 8–23)
BUN: 17 mg/dL (ref 8–23)
CO2: 21 mmol/L — ABNORMAL LOW (ref 22–32)
CO2: 22 mmol/L (ref 22–32)
Calcium: 8.5 mg/dL — ABNORMAL LOW (ref 8.9–10.3)
Calcium: 8.3 mg/dL — ABNORMAL LOW (ref 8.9–10.3)
Chloride: 106 mmol/L (ref 98–111)
Chloride: 101 mmol/L (ref 98–111)
Creatinine, Ser: 1.36 mg/dL — ABNORMAL HIGH (ref 0.44–1.00)
Creatinine, Ser: 1.24 mg/dL — ABNORMAL HIGH (ref 0.44–1.00)
GFR, Estimated: 40 mL/min — ABNORMAL LOW (ref >60)
GFR, Estimated: 45 mL/min — ABNORMAL LOW (ref >60)
Glucose, Bld: 134 mg/dL — ABNORMAL HIGH (ref 70–99)
Glucose, Bld: 131 mg/dL — ABNORMAL HIGH (ref 70–99)
Potassium: 3.8 mmol/L (ref 3.5–5.1)
Potassium: 4.3 mmol/L (ref 3.5–5.1)
Sodium: 137 mmol/L (ref 135–145)
Sodium: 132 mmol/L — ABNORMAL LOW (ref 135–145)

## 2023-03-07 LAB — CBC
HCT: 29.7 % — ABNORMAL LOW (ref 36.0–46.0)
HCT: 27.7 % — ABNORMAL LOW (ref 36.0–46.0)
Hemoglobin: 9.5 g/dL — ABNORMAL LOW (ref 12.0–15.0)
Hemoglobin: 9.1 g/dL — ABNORMAL LOW (ref 12.0–15.0)
MCH: 30.2 pg (ref 26.0–34.0)
MCH: 30.8 pg (ref 26.0–34.0)
MCHC: 32 g/dL (ref 30.0–36.0)
MCHC: 32.9 g/dL (ref 30.0–36.0)
MCV: 94.3 fL (ref 80.0–100.0)
MCV: 93.9 fL (ref 80.0–100.0)
Platelets: 159 10*3/uL (ref 150–400)
Platelets: 128 10*3/uL — ABNORMAL LOW (ref 150–400)
RBC: 3.15 MIL/uL — ABNORMAL LOW (ref 3.87–5.11)
RBC: 2.95 MIL/uL — ABNORMAL LOW (ref 3.87–5.11)
RDW: 13.9 % (ref 11.5–15.5)
RDW: 14.1 % (ref 11.5–15.5)
WBC: 17 10*3/uL — ABNORMAL HIGH (ref 4.0–10.5)
WBC: 18.4 10*3/uL — ABNORMAL HIGH (ref 4.0–10.5)
nRBC: 0 % (ref 0.0–0.2)
nRBC: 0 % (ref 0.0–0.2)

## 2023-03-07 LAB — SURGICAL PATHOLOGY

## 2023-03-07 LAB — LIPID PANEL
Cholesterol: 53 mg/dL (ref 0–200)
HDL: 31 mg/dL — ABNORMAL LOW (ref >40)
LDL Cholesterol: 14 mg/dL (ref 0–99)
Total CHOL/HDL Ratio: 1.7 {ratio}
Triglycerides: 42 mg/dL (ref <150)
VLDL: 8 mg/dL (ref 0–40)

## 2023-03-07 LAB — COOXEMETRY PANEL
Carboxyhemoglobin: 0.7 % (ref 0.5–1.5)
Methemoglobin: <0.7 % (ref 0.0–1.5)
O2 Saturation: 58.3 %
Total hemoglobin: 9.7 g/dL — ABNORMAL LOW (ref 12.0–16.0)

## 2023-03-07 MED ORDER — IPRATROPIUM-ALBUTEROL 0.5-2.5 (3) MG/3ML IN SOLN
3.0000 mL | Freq: Four times a day (QID) | RESPIRATORY_TRACT | Status: DC
  Administered 2023-03-07 – 2023-03-10 (×12): 3 mL via RESPIRATORY_TRACT
  Filled 2023-03-07 (×12): qty 3

## 2023-03-07 MED ORDER — FUROSEMIDE 10 MG/ML IJ SOLN
40.0000 mg | Freq: Once | INTRAMUSCULAR | Status: AC
  Administered 2023-03-07: 40 mg via INTRAVENOUS
  Filled 2023-03-07: qty 4

## 2023-03-07 MED ORDER — SODIUM CHLORIDE 0.9 % IV SOLN
1.0000 g | INTRAVENOUS | Status: DC
  Administered 2023-03-08 – 2023-03-09 (×2): 1 g via INTRAVENOUS
  Filled 2023-03-07 (×3): qty 10

## 2023-03-07 MED ORDER — INSULIN ASPART 100 UNIT/ML IJ SOLN
0.0000 [IU] | INTRAMUSCULAR | Status: DC
  Administered 2023-03-08 (×3): 2 [IU] via SUBCUTANEOUS

## 2023-03-07 MED ORDER — ENOXAPARIN SODIUM 40 MG/0.4ML IJ SOSY: 40.0000 mg | PREFILLED_SYRINGE | Freq: Every day | INTRAMUSCULAR | Status: DC

## 2023-03-07 MED ORDER — ASPIRIN 81 MG PO CHEW
81.0000 mg | CHEWABLE_TABLET | Freq: Every day | ORAL | Status: DC
  Administered 2023-03-07 – 2023-03-22 (×15): 81 mg via ORAL
  Filled 2023-03-07 (×15): qty 1

## 2023-03-07 MED ORDER — POTASSIUM CHLORIDE CRYS ER 20 MEQ PO TBCR
20.0000 meq | EXTENDED_RELEASE_TABLET | Freq: Once | ORAL | Status: AC
  Administered 2023-03-07: 20 meq via ORAL
  Filled 2023-03-07: qty 1

## 2023-03-07 MED ORDER — ENOXAPARIN SODIUM 40 MG/0.4ML IJ SOSY
40.0000 mg | PREFILLED_SYRINGE | Freq: Every day | INTRAMUSCULAR | Status: DC
  Administered 2023-03-07 – 2023-03-10 (×4): 40 mg via SUBCUTANEOUS
  Filled 2023-03-07 (×4): qty 0.4

## 2023-03-07 MED ORDER — HYDROMORPHONE HCL 1 MG/ML IJ SOLN
0.5000 mg | INTRAMUSCULAR | Status: DC | PRN
  Administered 2023-03-08: 0.5 mg via INTRAVENOUS
  Filled 2023-03-07: qty 1

## 2023-03-07 MED ORDER — ENOXAPARIN SODIUM 30 MG/0.3ML IJ SOSY: 30.0000 mg | PREFILLED_SYRINGE | Freq: Every day | INTRAMUSCULAR | Status: DC

## 2023-03-07 MED FILL — Mannitol IV Soln 20%: INTRAVENOUS | Qty: 500 | Status: AC

## 2023-03-07 MED FILL — Lidocaine HCl Local Soln Prefilled Syringe 100 MG/5ML (2%): INTRAMUSCULAR | Qty: 5 | Status: AC

## 2023-03-07 MED FILL — Heparin Sodium (Porcine) Inj 1000 Unit/ML: INTRAMUSCULAR | Qty: 10 | Status: AC

## 2023-03-07 MED FILL — Sodium Chloride IV Soln 0.9%: INTRAVENOUS | Qty: 2000 | Status: AC

## 2023-03-07 MED FILL — Electrolyte-R (PH 7.4) Solution: INTRAVENOUS | Qty: 4000 | Status: AC

## 2023-03-07 MED FILL — Sodium Bicarbonate IV Soln 8.4%: INTRAVENOUS | Qty: 50 | Status: AC

## 2023-03-07 NOTE — Progress Notes (Signed)
NAME:  Penny Mccullough, MRN:  [id number], DOB:  01/29/1946, LOS: 1 ADMISSION DATE:  03/06/2023, CONSULTATION DATE: 03/06/2023 REFERRING MD: Eugenio Hoes, CHIEF COMPLAINT: Status post mitral valve replacement/tricuspid valve repair  History of Present Illness:  78 year old female with COPD, ex-smoker coronary artery disease status post CABG and MI status post LAD stent, paroxysmal A-fib on Eliquis and amiodarone and rheumatic mitral valve disease who underwent workup for exertional dyspnea, noted to have severe mitral regurgitation and tricuspid regurgitation, today she underwent mitral valve replacement and tricuspid valve repair.  She was transferred to ICU postop, PCCM was consulted for help evaluation medical management  Pertinent  Medical History   Past Medical History:  Diagnosis Date   CAD (coronary artery disease)    CHF (congestive heart failure) (HCC)    COPD (chronic obstructive pulmonary disease) (HCC)    CVA (cerebral vascular accident) (HCC)    Dysrhythmia    A.Fib   Heart murmur    History of kidney stones    HLD (hyperlipidemia)    Hypertension    MI (myocardial infarction) (HCC) 10/2020     Significant Hospital Events: Including procedures, antibiotic start and stop dates in addition to other pertinent events     Interim History / Subjective:  Patient is complaining of surgical site pain as well as back pain Stated breathing is okay but overall uncomfortable She was extubated per rapid weaning protocol She was on BiPAP overnight, currently on 4 L nasal cannula oxygen Remain on milrinone and epinephrine  Objective   Blood pressure 99/79, pulse (!) 102, temperature 99.9 F (37.7 C), resp. rate (!) 26, height 5\' 1"  (1.549 m), weight 66.3 kg, SpO2 93%. PAP: (19-62)/(-6-42) 47/21 CVP:  [2 mmHg-30 mmHg] 9 mmHg CO:  [2 L/min-5.2 L/min] 4 L/min CI:  [1.25 L/min/m2-3.3 L/min/m2] 2.5 L/min/m2  Vent Mode: BIPAP;PCV FiO2 (%):  [30 %-50 %] 40 % Set Rate:  [4 bmp-20 bmp]  20 bmp Vt Set:  [380 mL] 380 mL PEEP:  [5 cmH20] 5 cmH20 Pressure Support:  [10 cmH20] 10 cmH20 Plateau Pressure:  [13 cmH20] 13 cmH20   Intake/Output Summary (Last 24 hours) at 03/07/2023 0756 Last data filed at 03/07/2023 0700 Gross per 24 hour  Intake 4585.05 ml  Output 2477 ml  Net 2108.05 ml   Filed Weights   03/06/23 0644 03/07/23 0500  Weight: 60.8 kg 66.3 kg    Examination: General: Elderly female, lying on the bed HEENT: [place]/AT, eyes anicteric.  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 Continue aspirin and statin Chest tube management TCTS Chest tube output was 400 cc since coming to ICU, OR Continue pain control with tramadol, oxycodone and morphine Closely monitor chest tube output  Postop vasoplegia/cardiogenic shock Chronic biventricular HFrEF Patient continued to require vasopressor and inotropic support due to her biventricular HFrEF with cardiogenic shock She is currently on epinephrine at 3 mics and milrinone at 0.25 Levophed was titrated off Continue to titrate vasopressor support with MAP goal 65 Patient echocardiogram showed EF 45% with septal  wall motion abnormality preop, postop TEE showed EF around 40% with moderate reduced RV function Monitor intake and output GDMT when tolerating  Paroxysmal A-fib on Eliquis at home Amiodarone infusion was switched to p.o. Continue as needed  metoprolol Patient is in sinus rhythm  Acute respiratory failure with hypoxia Patient was successfully extubated per rapid weaning protocol Her PaO2 remain low, required BiPAP overnight Currently on 4 L nasal cannula oxygen, titrate with O2 sat goal 92% Incentive spirometry  Hypertension Holding antihypertensive for now, as patient is requiring vasopressor support  Hyperlipidemia Continue atorvastatin and Zetia  Pre- Diabetes type 2 Patient hemoglobin A1c is 6.1 Transition insulin infusion to sliding scale with CBG goal 140-180  COPD, not in exacerbation Continue DuoNebs every 6 hours scheduled as she has expiratory wheezes  Acute urinary tract infection, POA Patient had urinalysis done on January 13 which showing leukocyte esterase positive Will try to add urine culture to prior sample Continue IV ceftriaxone to complete 5 days therapy  Expected perioperative blood loss anemia Thrombocytopenia due to CPB Monitor H/H and PLT counts Platelet counts improved  Hypokalemia, resolved  Best Practice (right click and "Reselect all SmartList Selections" daily)   Diet/type: Regular consistency DVT prophylaxis: Subcu Lovenox GI prophylaxis: PPI Lines: Central line, Arterial Line, and yes and it is still needed Foley:  Yes, and it is still needed Code Status:  full code Last date of multidisciplinary goals of care discussion [Per primary team]   Labs   CBC: Recent Labs  Lab 03/04/23 1430 03/06/23 0845 03/06/23 1035 03/06/23 1043 03/06/23 1245 03/06/23 1649 03/06/23 1850 03/06/23 2105 03/07/23 0457 03/07/23 0506  WBC 8.7  --   --   --  17.2*  --  16.4*  --  17.0*  --   HGB 14.2   < > 8.5*   < > 9.3* 10.5* 10.5*  10.5* 9.5* 9.5* 9.9*  HCT 43.2   < > 26.0*   < > 29.3* 31.0* 31.0*  31.8* 28.0* 29.7* 29.0*  MCV 94.5  --   --   --  96.4  --  94.9  --  94.3  --   PLT 327  --  166  --  124*  --  178  --  159  --    < > = values in this interval not displayed.     Basic Metabolic Panel: Recent Labs  Lab 03/04/23 1430 03/06/23 0845 03/06/23 1013 03/06/23 1043 03/06/23 1113 03/06/23 1142 03/06/23 1145 03/06/23 1649 03/06/23 1850 03/06/23 2105 03/07/23 0411 03/07/23 0506  NA 139   < > 135   < > 136 139   < > 141 139  142 141 137 138  K 4.8   < > 4.5   < > 5.0 4.2   < > 3.7 3.4*  3.2* 3.2* 3.8 3.8  CL 103   < > 99  --  102 104  --   --  108  --  106  --   CO2 26  --   --   --   --   --   --   --  22  --  21*  --   GLUCOSE 106*   < > 140*  --  124* 158*  --   --  155*  --  134*  --   BUN 24*   < > 22  --  24* 23  --   --  19  --  18  --  CREATININE 1.31*   < > 1.20*  --  1.20* 1.10*  --   --  1.30*  --  1.36*  --   CALCIUM 9.4  --   --   --   --   --   --   --  8.7*  --  8.5*  --   MG  --   --   --   --   --   --   --   --  3.8*  --  2.7*  --    < > = values in this interval not displayed.   GFR: Estimated Creatinine Clearance: 30.7 mL/min (A) (by C-G formula based on SCr of 1.36 mg/dL (H)). Recent Labs  Lab 03/04/23 1430 03/06/23 1245 03/06/23 1850 03/07/23 0457  WBC 8.7 17.2* 16.4* 17.0*    Liver Function Tests: Recent Labs  Lab 03/04/23 1430  AST 21  ALT 25  ALKPHOS 102  BILITOT 1.1  PROT 7.0  ALBUMIN 4.0   No results for input(s): "LIPASE", "AMYLASE" in the last 168 hours. No results for input(s): "AMMONIA" in the last 168 hours.  ABG    Component Value Date/Time   PHART 7.373 03/07/2023 0506   PCO2ART 38.3 03/07/2023 0506   PO2ART 60 (L) 03/07/2023 0506   HCO3 22.2 03/07/2023 0506   TCO2 23 03/07/2023 0506   ACIDBASEDEF 3.0 (H) 03/07/2023 0506   O2SAT 58.3 03/07/2023 0506   O2SAT 89 03/07/2023 0506     Coagulation Profile: Recent Labs  Lab 03/04/23 1430 03/06/23 1245  INR 1.0 1.5*    Cardiac Enzymes: No results for input(s): "CKTOTAL", "CKMB", "CKMBINDEX", "TROPONINI" in the last 168 hours.  HbA1C: Hgb A1c MFr Bld  Date/Time Value Ref Range Status  03/04/2023 02:30 PM 6.1 (H) 4.8 - 5.6 %  Final    Comment:    (NOTE) Pre diabetes:          5.7%-6.4%  Diabetes:              >6.4%  Glycemic control for   <7.0% adults with diabetes   10/27/2020 09:58 PM 5.2 4.8 - 5.6 % Final    Comment:    (NOTE) Pre diabetes:          5.7%-6.4%  Diabetes:              >6.4%  Glycemic control for   <7.0% adults with diabetes     CBG: Recent Labs  Lab 03/07/23 0038 03/07/23 0143 03/07/23 0303 03/07/23 0423 03/07/23 0503  GLUCAP 150* 155* 140* 129* 119*    Critical care time:      The patient is critically ill due to mitral valve regurgitation status post mitral valve replacement/acute respiratory failure with hypoxia/cardiogenic shock requiring titration of vasopressors.  Critical care was necessary to treat or prevent imminent or life-threatening deterioration.  Critical care was time spent personally by me on the following activities: development of treatment plan with patient and/or surrogate as well as nursing, discussions with consultants, evaluation of patient's response to treatment, examination of patient, obtaining history from patient or surrogate, ordering and performing treatments and interventions, ordering and review of laboratory studies, ordering and review of radiographic studies, pulse oximetry, re-evaluation of patient's condition and participation in multidisciplinary rounds.   During this encounter critical care time was devoted to patient care services described in this note for 34 minutes.    Cheri Fowler, MD [facility] See Amion for pager If no response to pager,  please call [phone] until 7pm After 7pm, Please call E-link [phone]

## 2023-03-07 NOTE — Plan of Care (Signed)
  Problem: Clinical Measurements: Goal: Ability to maintain clinical measurements within normal limits will improve Outcome: Progressing   

## 2023-03-07 NOTE — Progress Notes (Addendum)
[facility] DAILY ICU PROGRESS NOTE                   [address]            [name] [address]          [phone]   1 Day Post-Op Procedure(s) (LRB): REDO STERNOTOMY (N/A) MITRAL VALVE (MV) REPLACEMENT, USING 29 MM MEDTRONIC MOSAIC PORCINE HEART VALVE (N/A) TRANSESOPHAGEAL ECHOCARDIOGRAM (N/A) TRICUSPID VALVE REPAIR, USING 30 MM MC3 ANNULOPLASTY RING  Total Length of Stay:  LOS: 1 day   Subjective: Some  soreness  Objective: Vital signs in last 24 hours: Temp:  [95.5 F (35.3 C)-99.9 F (37.7 C)] 99.9 F (37.7 C) (01/16 0700) Pulse Rate:  [74-101] 89 (01/16 0700) Cardiac Rhythm: Ventricular paced (01/16 0400) Resp:  [10-27] 18 (01/16 0700) BP: (80-103)/(61-79) 99/79 (01/15 0810) SpO2:  [85 %-100 %] 94 % (01/16 0700) FiO2 (%):  [30 %-50 %] 40 % (01/16 0000) Weight:  [66.3 kg] 66.3 kg (01/16 0500)  Filed Weights   03/06/23 0644 03/07/23 0500  Weight: 60.8 kg 66.3 kg    Weight change: 5.518 kg   Hemodynamic parameters for last 24 hours: PAP: (19-62)/(-6-42) 39/21 CVP:  [2 mmHg-30 mmHg] 5 mmHg CO:  [2 L/min-5.2 L/min] 4 L/min CI:  [1.25 L/min/m2-3.3 L/min/m2] 2.5 L/min/m2  Intake/Output from previous day: 01/15 0701 - 01/16 0700 In: 4585.1 [I.V.:2853.3; Blood:229; IV Piggyback:1502.8] Out: 2477 [Urine:2075; Chest Tube:402]  Intake/Output this shift: No intake/output data recorded.  Current Meds: Scheduled Meds:  acetaminophen  1,000 mg Oral Q6H   Or   acetaminophen (TYLENOL) oral liquid 160 mg/5 mL  1,000 mg Per Tube Q6H   amiodarone  200 mg Oral Daily   aspirin EC  325 mg Oral Daily   Or   aspirin  324 mg Per Tube Daily   atorvastatin  80 mg Oral Daily   bisacodyl  10 mg Oral Daily   Or   bisacodyl  10 mg Rectal Daily   Chlorhexidine Gluconate Cloth  6 each Topical Daily   docusate sodium  200 mg Oral Daily   ezetimibe  10 mg Oral Daily   metoCLOPramide (REGLAN) injection  10 mg Intravenous Q6H   metoprolol tartrate  12.5 mg Oral BID    Or   metoprolol tartrate  12.5 mg Per Tube BID   mouth rinse  15 mL Mouth Rinse 4 times per day   [START ON 03/08/2023] pantoprazole  40 mg Oral Daily   pantoprazole (PROTONIX) IV  40 mg Intravenous QHS   sodium chloride flush  3 mL Intravenous Q12H   sodium chloride flush  3-10 mL Intravenous Q12H   Continuous Infusions:  sodium chloride Stopped (03/06/23 1257)   sodium chloride 1 mL/hr at 03/07/23 0700   albumin human     amiodarone 30 mg/hr (03/07/23 0700)   cefTRIAXone (ROCEPHIN)  IV Stopped (03/06/23 1644)   dexmedetomidine (PRECEDEX) IV infusion Stopped (03/06/23 1603)   epinephrine 3 mcg/min (03/07/23 0700)   insulin 1.5 Units/hr (03/07/23 0700)   lactated ringers 10 mL/hr at 03/07/23 0700   lactated ringers 10 mL/hr at 03/07/23 0700   milrinone 0.25 mcg/kg/min (03/07/23 0700)   niCARDipine     norepinephrine (LEVOPHED) Adult infusion Stopped (03/06/23 2026)   PRN Meds:.sodium chloride, albumin human, dextrose, metoprolol tartrate, midazolam, morphine injection, ondansetron (ZOFRAN) IV, mouth rinse, oxyCODONE, sodium chloride flush, sodium chloride flush, traMADol  General appearance: alert, cooperative, and no distress Heart: irregularly irregular rhythm Lungs: +  wheezing throughout Abdomen: soft, non tender Extremities: minimal edema Wound: dressings CDI  Lab Results: CBC: Recent Labs    03/06/23 1850 03/06/23 2105 03/07/23 0457 03/07/23 0506  WBC 16.4*  --  17.0*  --   HGB 10.5*  10.5*   < > 9.5* 9.9*  HCT 31.0*  31.8*   < > 29.7* 29.0*  PLT 178  --  159  --    < > = values in this interval not displayed.   BMET:  Recent Labs    03/06/23 1850 03/06/23 2105 03/07/23 0411 03/07/23 0506  NA 139  142   < > 137 138  K 3.4*  3.2*   < > 3.8 3.8  CL 108  --  106  --   CO2 22  --  21*  --   GLUCOSE 155*  --  134*  --   BUN 19  --  18  --   CREATININE 1.30*  --  1.36*  --   CALCIUM 8.7*  --  8.5*  --    < > = values in this interval not displayed.     CMET: Lab Results  Component Value Date   WBC 17.0 (H) 03/07/2023   HGB 9.9 (L) 03/07/2023   HCT 29.0 (L) 03/07/2023   PLT 159 03/07/2023   GLUCOSE 134 (H) 03/07/2023   CHOL 53 03/07/2023   TRIG 42 03/07/2023   HDL 31 (L) 03/07/2023   LDLCALC 14 03/07/2023   ALT 25 03/04/2023   AST 21 03/04/2023   NA 138 03/07/2023   K 3.8 03/07/2023   CL 106 03/07/2023   CREATININE 1.36 (H) 03/07/2023   BUN 18 03/07/2023   CO2 21 (L) 03/07/2023   TSH 3.144 07/19/2022   INR 1.5 (H) 03/06/2023   HGBA1C 6.1 (H) 03/04/2023      PT/INR:  Recent Labs    03/06/23 1245  LABPROT 18.3*  INR 1.5*   Radiology: [facility] Chest Port 1 View Result Date: 03/06/2023 CLINICAL DATA:  Status post mitral bowel repair. EXAM: PORTABLE CHEST 1 VIEW COMPARISON:  Chest radiograph dated 03/04/2023. FINDINGS: Endotracheal tube with tip approximately 2 cm above the carina. Recommend retraction by 2 cm for optimal positioning. Enteric tube extends below the diaphragm with tip beyond the margin of the image. Right IJ Swan-Ganz with tip in the region of the right main pulmonary artery. Inferiorly accessed drainage catheter. Bibasilar reticulonodular densities may represent atelectasis or infiltrate. There is mild cardiomegaly with mild central vascular congestion. A small left pleural effusion may be present. No pneumothorax. Atherosclerotic calcification of the aorta. No acute osseous pathology. IMPRESSION: 1. Endotracheal tube with tip approximately 2 cm above the carina. Recommend retraction by 2 cm for optimal positioning. 2. Bibasilar atelectasis or infiltrate. 3. Mild cardiomegaly with mild central vascular congestion. Electronically Signed   By: Elgie Collard M.D.   On: 03/06/2023 14:56   ECHO INTRAOPERATIVE TEE Result Date: 03/06/2023  *INTRAOPERATIVE TRANSESOPHAGEAL REPORT *  Patient Name:   Penny Mccullough   Date of Exam: 03/06/2023 Medical Rec #:  [id number]      Height:       61.0 in Accession #:    [id number]      Weight:       134.0 lb Date of Birth:  [birth year]     BSA:          1.59 m Patient Age:    78 years       BP: Patient Gender: F  HR: Exam Location:  Anesthesiology Transesophogeal exam was perform intraoperatively during surgical procedure. Patient was closely monitored under general anesthesia during the entirety of examination. Indications:     MVR Performing Phys: [id number] Eugenio Hoes Complications: No known complications during this procedure. POST-OP IMPRESSIONS _ Left Ventricle: The left ventriclar function is approximately 40% on inotropic support. _ Right Ventricle: The right ventriclar function is moderately reduced on inotropic support. _ Aorta: The aorta appears unchanged from pre-bypass. There is no dissection. _ Left Atrial Appendage: There is no residual left atrial appendage following surgical excision. _ Aortic Valve: The aortic valve appears unchanged from pre-bypass. 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 Multiple spectral doppler profiles showing no increased gradients across the LVOT. Mean PG . 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 The valve is well seated with no paravalvular leak. The mean PG is . _ Tricuspid Valve: There is an annuloplasty ring in the tricuspid position. There is mild TR. _ Pulmonic Valve: The pulmonic valve appears unchanged from pre-bypass. _ Pericardium: The pericardium appears unchanged from pre-bypass. PRE-OP FINDINGS  Left Ventricle: The left ventricle has mildly reduced systolic function, with an ejection fraction of approximately 45%. The cavity size was normal. Right Ventricle: The right ventricle has normal systolic function. The cavity was dialated. Left Atrium: Left atrial size was dilated. No left atrial/left atrial appendage thrombus was detected. Interatrial Septum: No atrial level shunt detected by color flow Doppler. There is no  evidence of a patent foramen ovale. Pericardium: There is no evidence of pericardial effusion. Mitral Valve: The mitral valve is rheumatic. Mitral valve regurgitation is severe by color flow Doppler. There is Moderate mitral stenosis. Tricuspid Valve: The tricuspid valve was dilated in appearance. Tricuspid valve regurgitation is mild by color flow Doppler. Tricuspid annulus measures 4.3cm (10mm/m^2). Aortic Valve: The aortic valve is tricuspid. Aortic valve regurgitation is trivial by color flow Doppler. There is no stenosis of the aortic valve. Pulmonic Valve: The pulmonic valve was normal in structure. Pulmonic valve regurgitation is mild by color flow Doppler. Aorta: The aortic root and ascending aorta are normal in size and structure. There is evidence of plaque in the descending aorta and aortic arch.  Anice Paganini Electronically signed by Anice Paganini Signature Date/Time: 03/06/2023/2:12:29 PM    Final    EP STUDY Result Date: 03/06/2023 See surgical note for result.    Assessment/Plan: S/P Procedure(s) (LRB): REDO STERNOTOMY (N/A) MITRAL VALVE (MV) REPLACEMENT, USING 29 MM MEDTRONIC MOSAIC PORCINE HEART VALVE (N/A) TRANSESOPHAGEAL ECHOCARDIOGRAM (N/A) TRICUSPID VALVE REPAIR, USING 30 MM MC3 ANNULOPLASTY RING POD#1  1 Tmax 99.9, s BP 80-103, PA pressures elevated, trending better,  good CO/CI, CVP 5, gtts- milrinone and amiodarone, afib w/ CVR , was on amio preop so may not need gtt 2 O2 sats good on 4 liters, ABG w/hypoxemia 3 good UOP, weight 6 kg>preop, will need some diuresis 4 CT 402 ml recorded 5 CBG- good control, normal protocols for non diabetic 7 creat dlow trend higher post op, 1.36 8 likely reactive leukocytosis 8 expected ABLA, H/H appears stable 9 CXR some basilar atx/ASD 10 PCCM is assisting w/ management , will defer nebs management to them   Rowe Clack 03/07/2023 7:07 AM  Doing well Weaning epi then milrinone Diuretics Will use eliquis post for  anticoagulation Needs pulmonary support  with inhalers Antibiotics for preop UA Stop amio drip

## 2023-03-07 NOTE — Progress Notes (Signed)
Patient ID: Joellie Dellatorre, female   DOB: 1946-01-06, 78 y.o.   MRN: [id number]  [facility] Evening Rounds:  Hemodynamically stable in atrial fib rate 70's. Milrinone 0.25, epi down to 1.  UO good.  BMET    Component Value Date/Time   NA 132 (L) 03/07/2023 1715   NA 143 02/05/2023 1130   K 4.3 03/07/2023 1715   CL 101 03/07/2023 1715   CO2 22 03/07/2023 1715   GLUCOSE 131 (H) 03/07/2023 1715   BUN 17 03/07/2023 1715   BUN 14 02/05/2023 1130   CREATININE 1.24 (H) 03/07/2023 1715   CALCIUM 8.3 (L) 03/07/2023 1715   EGFR 51 (L) 02/05/2023 1130   GFRNONAA 45 (L) 03/07/2023 1715   CBC    Component Value Date/Time   WBC 18.4 (H) 03/07/2023 1715   RBC 2.95 (L) 03/07/2023 1715   HGB 9.1 (L) 03/07/2023 1715   HGB 13.9 01/16/2023 0908   HCT 27.7 (L) 03/07/2023 1715   HCT 43.0 01/16/2023 0908   PLT 128 (L) 03/07/2023 1715   PLT 324 01/16/2023 0908   MCV 93.9 03/07/2023 1715   MCV 98 (H) 01/16/2023 0908   MCH 30.8 03/07/2023 1715   MCHC 32.9 03/07/2023 1715   RDW 14.1 03/07/2023 1715   RDW 12.0 01/16/2023 0908   LYMPHSABS 1.8 11/21/2020 1518   MONOABS 1.6 (H) 10/28/2020 0655   EOSABS 0.2 11/21/2020 1518   BASOSABS 0.1 11/21/2020 1518

## 2023-03-07 NOTE — Discharge Summary (Signed)
[address]       [place] [address]             [phone]    Physician Discharge Summary  Patient ID: Penny Mccullough MRN: [id number] DOB/AGE: 77-Feb-1947 77 y.o.  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/06/2023   Mitral regurgitation 08/06/2022   CAD (coronary artery disease) 01/09/2021   COPD (chronic obstructive pulmonary disease) (HCC) 01/09/2021   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   NSTEMI (non-ST elevated myocardial infarction) (HCC) 10/27/2020   Acute lower UTI 10/27/2020   Lacunar infarction (HCC) 10/27/2020   Hyperglycemia 10/27/2020   Breast lesion 10/27/2020   Pulmonary nodule 10/27/2020     Discharge Diagnoses:  Patient Active Problem List   Diagnosis Date Noted   Constipation 03/17/2023   Acute HFrEF (heart failure with reduced ejection fraction) (HCC) 03/13/2023   Atrial fibrillation with RVR (HCC) 03/12/2023   Acute pulmonary edema (HCC) 03/11/2023   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 Pt is 78 yo and  has had a previous CABG (LIMA to LAD) which had been found to be occluded 2 yrs ago with an MI and had LAD stented. PT with afib on eliquis and amiodarone and was noted to have severe MR and an EF of 45%. She was relatively asymptomatic in June and was felt to continue with medical management. A stress test in July failed to achieve target heart rate and was inconclusive. She had repeat TEE this past September with ongoing rheumatic mitral regurgitation and unchanged EF. She was not a candidate for mteer and was felt best to undergo surgical MVR. She wanted to discuss the operation and timing. She has COPD and not on inhalers or O2. 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 This has not changed much  from June. She was told to cut her diuretics in half but yesterday started to notice lower ext edema.  Following full review of the patient and her relevant studies Dr. Leafy Ro recommended proceeding with redo sternotomy for mitral valve replacement and tricuspid valve repair.  Hospital course:  Patient was admitted electively and on 03/06/2023 taken the operating room at which time she underwent redo sternotomy with mitral valve replacement and tricuspid valve ring annuloplasty.  She tolerated the procedure well and was taken to the surgical intensive care unit in stable condition.  Postoperative hospital course:  The patient was weaned off the ventilator without difficulty but did initially require BiPAP for hypoxia.  The  PCCM team was consulted to assist with ICU management.  She initially required epinephrine and milrinone for inotropic support this is being weaned off based on hemodynamic parameters.  She has chronic atrial fibrillation remained in this postoperatively.  Plan is for Eliquis as anticoagulant therapy.  She initially was started on IV amiodarone due to significant ventricular ectopy and this has been  transitioned to oral dosing.  She has postoperative volume overload as expected following cardiac surgery but not considered to be clinically significant and was diuresed accordingly.  She is noted to have a preoperative UA showing UTI and was treated postoperatively with antibiotics.  She has an expected acute blood loss anemia which is being trended and overall stable, not in the infusion threshold. Chest tubes were removed on 01/17 without complication. Coumadin was started on 01/18.  By postop day 3, all inotropic support had been weaned off.  Co. ox was stable at 58.  Diuresis was continued for interstitial pulmonary edema.  She developed nausea with mild abdominal distention and hypoactive bowel sounds consistent with ileus.  She was started on IV Reglan. By postop day 4, the nausea had improved and the abdominal exam was benign.  Penny Mccullough had a history of paroxysmal atrial fibrillation and was on Eliquis prior to admission.  She had recurrent A-fib postoperatively with RVR. Coumadin was started with the pharmacy team managing dosing while she was an inpatient.  She was also treated with both oral and intravenous amiodarone.  Pulmonary toilet was started since she continued to require 12L HFNC. She was diuresed for postoperative pulmonary edema likely caused by administration of fluids leading to acute exacerbation of chronic HFrEF. Echocardiogram on 1/21 showed EF 25-30%, no pericardial effusion and valves were functioning appropriately. The advanced heart failure team was consulted for assistance with management.  Cooximetry was noted to be low and they placed her on milrinone.  Her Co-ox level improved and her diuretics were able to be discontinued.  INR reached goal on 2.3, she underwent DCCV on 1/28. She was felt stable for transfer to the progressive unit.  She is continue to progress and is maintaining a sinus bradycardia in the 50s.  The advanced heart failure team have maximized her current  therapy/GDMT as able.  Her most recent Co. oximetry is 65.  Oxygen has been weaned and she maintains good saturations.  Incisions are healing well without evidence of infection.  She is tolerating diet and having bowel movements.  She is having gradually increasing activities using standard protocols with cardiac rehab as well as the assistance of physical therapy.  Home health arrangements have been made.  Coumadin clinic for follow-up has been arranged.  Overall, at the time of discharge the patient is felt to be quite stable.   Consults: cardiology and pulmonary/intensive care (Advanced heart failure team and electrophysiology) Significant Diagnostic Studies:  DG Chest 2 View Result Date: 03/20/2023 CLINICAL DATA:  Status post mitral valve repair. EXAM: CHEST - 2 VIEW COMPARISON:  March 13, 2023. FINDINGS: Stable cardiomegaly with central pulmonary vascular congestion. Bilateral pulmonary edema is noted with small pleural effusions. Right-sided PICC line is unchanged. Sternotomy wires are noted. Bony thorax is unremarkable. IMPRESSION: Increased central pulmonary vascular congestion and bilateral pulmonary edema is noted with small pleural effusions. Electronically Signed   By: Lupita Raider M.D.   On: 03/20/2023 09:55   EP STUDY Result Date: 03/19/2023 See surgical note for result.  DG CHEST PORT 1 VIEW Result Date: 03/13/2023 CLINICAL DATA:  PICC line placement EXAM: PORTABLE CHEST 1 VIEW COMPARISON:  03/12/2023 FINDINGS: Single frontal view of the chest demonstrates stable enlargement of the cardiac silhouette. Interval right-sided PICC placement, tip overlying superior vena cava. There is persistent vascular congestion, with interstitial opacities and small bilateral effusions consistent with volume overload. No pneumothorax. No acute bony abnormalities. IMPRESSION: 1. Stable findings of congestive heart failure and mild edema. 2. Stable small bilateral pleural effusions. 3. PICC line tip  projecting over superior vena cava. Electronically Signed   By: Sharlet Salina M.D.   On: 03/13/2023 18:45   [place] EKG SITE RITE Result Date: 03/13/2023 If [facility] image not attached, placement could not be confirmed due to current cardiac rhythm.  ECHOCARDIOGRAM COMPLETE Result Date: 03/12/2023    ECHOCARDIOGRAM REPORT   Patient Name:   Penny Mccullough Date of Exam: 03/12/2023 Medical Rec #:  [id number]    Height:       61.0 in Accession #:    [id number]   Weight:       140.9 lb Date of Birth:  June 16, 1945   BSA:          1.627 m Patient Age:    77 years     BP:           122/85 mmHg Patient Gender: F            HR:           124 bpm. Exam Location:  Inpatient Procedure: 2D Echo, Color Doppler, Cardiac Doppler and Intracardiac            Opacification Agent Indications:    Status Post MVR z95.2  History:        Patient has prior history of Echocardiogram examinations, most                 recent 03/06/2023. CHF, CAD, COPD; Risk Factors:Hypertension and                 Dyslipidemia.                  Mitral Valve: 29 mm Medtronic Mosaic Bioprosthesis valve is                 present in the mitral position. Procedure Date: 03/06/23.  Sonographer:    Irving Burton Senior RDCS Referring Phys: [id number] Eugenio Hoes IMPRESSIONS  1. Left ventricular ejection fraction, by estimation, is 25 to 30%. The left ventricle has severely decreased function. The left ventricle demonstrates global hypokinesis. There is mild concentric left ventricular hypertrophy. Left ventricular diastolic  function could not be evaluated.  2. Right ventricular systolic function is severely reduced. The right ventricular size is moderately enlarged. There is moderately elevated pulmonary artery systolic pressure. The estimated right ventricular systolic pressure is 48.6 mmHg.  3. Left atrial size was severely dilated.  4. Right atrial size was mildly dilated.  5. The mitral valve has been repaired/replaced. Trivial mitral valve regurgitation. No evidence of  mitral stenosis. The mean mitral valve gradient is 3.7 mmHg with average heart rate of 120 bpm. There is a 29 mm Medtronic Mosaic Bioprosthesis present in  the mitral position. Procedure Date: 03/06/23. Echo findings are consistent with normal structure and function of the mitral valve prosthesis.  6. 30mm MC3 Tricuspid ring implanted 03/06/23     . The tricuspid valve is has been repaired/replaced. The tricuspid valve is status post repair with an annuloplasty ring.  7. The aortic valve is tricuspid. Aortic valve regurgitation  is trivial. Aortic valve sclerosis is present, with no evidence of aortic valve stenosis.  8. The inferior vena cava is dilated in size with <50% respiratory variability, suggesting right atrial pressure of 15 mmHg. Comparison(s): Prior images reviewed side by side. The left ventricular function is significantly worse. The right ventricular systolic function is significantly worse. The mitral valve has been replaced with a bioprosthesis and a tricuspid valve annuloplasty has been performed. FINDINGS  Left Ventricle: There is no left ventricular thrombus (Definity contrast was used). Left ventricular ejection fraction, by estimation, is 25 to 30%. The left ventricle has severely decreased function. The left ventricle demonstrates global hypokinesis. Definity contrast agent was given IV to delineate the left ventricular endocardial borders. The left ventricular internal cavity size was normal in size. There is mild concentric left ventricular hypertrophy. Abnormal (paradoxical) septal motion consistent with post-operative status. Left ventricular diastolic function could not be evaluated due to mitral valve replacement. Left ventricular diastolic function could not be evaluated. Right Ventricle: The right ventricular size is moderately enlarged. No increase in right ventricular wall thickness. Right ventricular systolic function is severely reduced. There is moderately elevated pulmonary artery  systolic pressure. The tricuspid regurgitant velocity is 2.90 m/s, and with an assumed right atrial pressure of 15 mmHg, the estimated right ventricular systolic pressure is 48.6 mmHg. Left Atrium: Left atrial size was severely dilated. Right Atrium: Right atrial size was mildly dilated. Pericardium: There is no evidence of pericardial effusion. Mitral Valve: The mitral valve has been repaired/replaced. Trivial mitral valve regurgitation. There is a 29 mm Medtronic Mosaic Bioprosthesis present in the mitral position. Procedure Date: 03/06/23. Echo findings are consistent with normal structure and  function of the mitral valve prosthesis. No evidence of mitral valve stenosis. MV peak gradient, 10.2 mmHg. The mean mitral valve gradient is 3.7 mmHg with average heart rate of 120 bpm. Tricuspid Valve: 30mm MC3 Tricuspid ring implanted 03/06/23. The tricuspid valve is has been repaired/replaced. Tricuspid valve regurgitation is mild . No evidence of tricuspid stenosis. The tricuspid valve is status post repair with an annuloplasty ring. Aortic Valve: The aortic valve is tricuspid. Aortic valve regurgitation is trivial. Aortic valve sclerosis is present, with no evidence of aortic valve stenosis. Pulmonic Valve: The pulmonic valve was normal in structure. Pulmonic valve regurgitation is mild. Aorta: The aortic root and ascending aorta are structurally normal, with no evidence of dilitation. Venous: The inferior vena cava is dilated in size with less than 50% respiratory variability, suggesting right atrial pressure of 15 mmHg. IAS/Shunts: No atrial level shunt detected by color flow Doppler.  LEFT VENTRICLE PLAX 2D LVIDd:         4.00 cm LVIDs:         3.50 cm LV PW:         1.20 cm LV IVS:        1.30 cm LVOT diam:     1.80 cm LV SV:         33 LV SV Index:   20 LVOT Area:     2.54 cm  LV Volumes (MOD) LV vol d, MOD A2C: 102.0 ml LV vol d, MOD A4C: 113.0 ml LV vol s, MOD A2C: 75.5 ml LV vol s, MOD A4C: 84.7 ml LV SV MOD  A2C:     26.5 ml LV SV MOD A4C:     113.0 ml LV SV MOD BP:      27.5 ml RIGHT VENTRICLE RV S prime:     2.94  cm/s LEFT ATRIUM              Index        RIGHT ATRIUM           Index LA diam:        4.90 cm  3.01 cm/m   RA Area:     19.30 cm LA Vol (A2C):   67.2 ml  41.29 ml/m  RA Volume:   60.80 ml  37.36 ml/m LA Vol (A4C):   104.0 ml 63.91 ml/m LA Biplane Vol: 85.3 ml  52.42 ml/m  AORTIC VALVE LVOT Vmax:   91.25 cm/s LVOT Vmean:  66.300 cm/s LVOT VTI:    0.130 m  AORTA Ao Root diam: 2.90 cm Ao Asc diam:  2.90 cm MITRAL VALVE             TRICUSPID VALVE MV Area VTI:  1.31 cm   TR Peak grad:   33.6 mmHg MV Peak grad: 10.2 mmHg  TR Vmax:        290.00 cm/s MV Mean grad: 3.7 mmHg MV Vmax:      1.60 m/s   SHUNTS MV Vmean:     86.6 cm/s  Systemic VTI:  0.13 m                          Systemic Diam: 1.80 cm Rachelle Hora Croitoru MD Electronically signed by Thurmon Fair MD Signature Date/Time: 03/12/2023/3:06:52 PM    Final    DG CHEST PORT 1 VIEW Result Date: 03/12/2023 CLINICAL DATA:  Hypoxia. EXAM: PORTABLE CHEST 1 VIEW COMPARISON:  Chest radiograph dated 03/10/2023 FINDINGS: There is cardiomegaly with vascular congestion. Overall slightly improved since the prior radiograph. Small bilateral pleural effusions and bibasilar atelectasis. Pneumonia is not excluded. No pneumothorax. Median sternotomy wires. No acute osseous pathology IMPRESSION: 1. Cardiomegaly with vascular congestion, slightly improved. 2. Small bilateral pleural effusions and bibasilar atelectasis. Electronically Signed   By: Elgie Collard M.D.   On: 03/12/2023 11:23   DG Chest Port 1 View Result Date: 03/10/2023 CLINICAL DATA:  Pulmonary edema. EXAM: PORTABLE CHEST 1 VIEW COMPARISON:  03/09/2023 and older studies. FINDINGS: Stable enlargement the cardiac silhouette and stable changes from prior cardiac surgery and valve replacement. Bilateral coarse interstitial thickening with hazy airspace opacities. Opacity lung bases consistent with small  pleural effusions. Findings are similar to the previous day's exam. Right internal jugular introducer sheath has been removed. No pneumothorax. IMPRESSION: 1. No significant change in lung aeration from the previous day's exam. 2. Findings consistent with congestive heart failure with pulmonary edema and small pleural effusions. Electronically Signed   By: Amie Portland M.D.   On: 03/10/2023 08:59   DG Abd 1 View Result Date: 03/10/2023 CLINICAL DATA:  Ileus. EXAM: ABDOMEN - 1 VIEW COMPARISON:  None Available. FINDINGS: Normal bowel gas pattern. No bowel dilation to suggest obstruction or significant adynamic ileus. Soft tissues are unremarkable. No acute skeletal abnormality. IMPRESSION: No acute findings. No evidence of bowel obstruction or significant adynamic ileus. Electronically Signed   By: Amie Portland M.D.   On: 03/10/2023 08:58   DG CHEST PORT 1 VIEW Result Date: 03/09/2023 CLINICAL DATA:  Mitral valve repair EXAM: PORTABLE CHEST 1 VIEW COMPARISON:  03/08/2023 FINDINGS: Postoperative heart with stable cardiopericardial enlargement. Swan-Ganz catheter and chest drain has been removed. Unchanged interstitial opacity with small pleural effusions. No pneumothorax. IMPRESSION: Stable postoperative chest with pleural fluid and interstitial edema. No visible pneumothorax. Electronically Signed  By: Tiburcio Pea M.D.   On: 03/09/2023 08:33   DG Chest Port 1 View Result Date: 03/08/2023 CLINICAL DATA:  Postop from mitral valve repair. EXAM: PORTABLE CHEST 1 VIEW COMPARISON:  03/07/2023 older studies. FINDINGS: Right internal jugular Swan-Ganz catheter, mediastinal tube and left inferior hemithorax chest tube are unchanged from the previous day's exam. Cardiac silhouette mildly enlarged. No mediastinal widening. Persistent bilateral irregular interstitial thickening as well as lung base opacities consistent with a combination pleural effusions and atelectasis. No pneumothorax. IMPRESSION: 1. No change  from the previous day's exam. 2. Persistent bilateral interstitial thickening, small bilateral pleural effusions and lung base atelectasis. Suspect interstitial pulmonary edema. 3. Remaining support apparatus is stable and well positioned. Electronically Signed   By: Amie Portland M.D.   On: 03/08/2023 09:45   DG Chest Port 1 View Result Date: 03/07/2023 CLINICAL DATA:  Status post mitral valve replacement. EXAM: PORTABLE CHEST 1 VIEW COMPARISON:  Chest radiograph dated 03/06/2023. FINDINGS: Status post removal of endotracheal and enteric tubes. The Swan-Ganz catheter and inferiorly accessed drains in similar position. No significant interval change in bibasilar pulmonary opacities. No pneumothorax. Stable cardiac silhouette. Median sternotomy wires. No acute osseous pathology. IMPRESSION: 1. Status post removal of endotracheal and enteric tubes. 2. No significant interval change in bibasilar pulmonary opacities. Electronically Signed   By: Elgie Collard M.D.   On: 03/07/2023 09:50   DG Chest Port 1 View Result Date: 03/06/2023 CLINICAL DATA:  Status post mitral bowel repair. EXAM: PORTABLE CHEST 1 VIEW COMPARISON:  Chest radiograph dated 03/04/2023. FINDINGS: Endotracheal tube with tip approximately 2 cm above the carina. Recommend retraction by 2 cm for optimal positioning. Enteric tube extends below the diaphragm with tip beyond the margin of the image. Right IJ Swan-Ganz with tip in the region of the right main pulmonary artery. Inferiorly accessed drainage catheter. Bibasilar reticulonodular densities may represent atelectasis or infiltrate. There is mild cardiomegaly with mild central vascular congestion. A small left pleural effusion may be present. No pneumothorax. Atherosclerotic calcification of the aorta. No acute osseous pathology. IMPRESSION: 1. Endotracheal tube with tip approximately 2 cm above the carina. Recommend retraction by 2 cm for optimal positioning. 2. Bibasilar atelectasis or  infiltrate. 3. Mild cardiomegaly with mild central vascular congestion. Electronically Signed   By: Elgie Collard M.D.   On: 03/06/2023 14:56   ECHO INTRAOPERATIVE TEE Result Date: 03/06/2023  *INTRAOPERATIVE TRANSESOPHAGEAL REPORT *  Patient Name:   Penny Mccullough   Date of Exam: 03/06/2023 Medical Rec #:  [id number]      Height:       61.0 in Accession #:    [id number]     Weight:       134.0 lb Date of Birth:  16-Mar-1945     BSA:          1.59 m Patient Age:    77 years       BP: Patient Gender: F              HR: Exam Location:  Anesthesiology Transesophogeal exam was perform intraoperatively during surgical procedure. Patient was closely monitored under general anesthesia during the entirety of examination. Indications:     MVR Performing Phys: [id number] Eugenio Hoes Complications: No known complications during this procedure. POST-OP IMPRESSIONS _ Left Ventricle: The left ventriclar function is approximately 40% on inotropic support. _ Right Ventricle: The right ventriclar function is moderately reduced on inotropic support. _ Aorta: The aorta appears unchanged from pre-bypass. There is no dissection.  _ Left Atrial Appendage: There is no residual left atrial appendage following surgical excision. _ Aortic Valve: The aortic valve appears unchanged from pre-bypass. 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 Multiple spectral doppler profiles showing no increased gradients across the LVOT. Mean PG . 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 The valve is well seated with no paravalvular leak. The mean PG is . _ Tricuspid Valve: There is an annuloplasty ring in the tricuspid position. There is mild TR. _ Pulmonic Valve: The pulmonic valve appears unchanged from pre-bypass. _ Pericardium: The pericardium appears unchanged from pre-bypass. PRE-OP FINDINGS  Left Ventricle: The left ventricle has mildly  reduced systolic function, with an ejection fraction of approximately 45%. The cavity size was normal. Right Ventricle: The right ventricle has normal systolic function. The cavity was dialated. Left Atrium: Left atrial size was dilated. No left atrial/left atrial appendage thrombus was detected. Interatrial Septum: No atrial level shunt detected by color flow Doppler. There is no evidence of a patent foramen ovale. Pericardium: There is no evidence of pericardial effusion. Mitral Valve: The mitral valve is rheumatic. Mitral valve regurgitation is severe by color flow Doppler. There is Moderate mitral stenosis. Tricuspid Valve: The tricuspid valve was dilated in appearance. Tricuspid valve regurgitation is mild by color flow Doppler. Tricuspid annulus measures 4.3cm (66mm/m^2). Aortic Valve: The aortic valve is tricuspid. Aortic valve regurgitation is trivial by color flow Doppler. There is no stenosis of the aortic valve. Pulmonic Valve: The pulmonic valve was normal in structure. Pulmonic valve regurgitation is mild by color flow Doppler. Aorta: The aortic root and ascending aorta are normal in size and structure. There is evidence of plaque in the descending aorta and aortic arch.  Anice Paganini Electronically signed by Anice Paganini Signature Date/Time: 03/06/2023/2:12:29 PM    Final    EP STUDY Result Date: 03/06/2023 See surgical note for result.  DG Chest 2 View Result Date: 03/04/2023 CLINICAL DATA:  Mitral valve insufficiency.  Preop exam. EXAM: CHEST - 2 VIEW COMPARISON:  01/22/2021 FINDINGS: Stable heart size and mediastinal contours. Coronary stent visualized. Prior median sternotomy. Mild chronic bronchial thickening without focal airspace disease. No pleural effusion or pneumothorax. No pulmonary edema. No acute osseous findings. IMPRESSION: No acute findings.  Chronic bronchial thickening. Electronically Signed   By: Narda Rutherford M.D.   On: 03/04/2023 20:12   VAS US CAROTID Result Date:  03/04/2023 Carotid Arterial Duplex Study Patient Name:  SHONDRA CAPPS  Date of Exam:   03/04/2023 Medical Rec #: [id number]     Accession #:    [id number] Date of Birth: 12/19/1945    Patient Gender: F Patient Age:   58 years Exam Location:  [facility] Procedure:      VAS US CAROTID Referring Phys: Eugenio Hoes --------------------------------------------------------------------------------  Indications:       Pre-operative mitral valve surgery. Risk Factors:      Hypertension, hyperlipidemia, past history of smoking, prior                    MI, coronary artery disease, prior CVA. Other Factors:     Dilated cardiomyopathy, Mitral regurgitation, COPD, CHF. Limitations        Today's exam was limited due to tortuosity, movement and the                    patient's respiratory variation. Comparison Study:  Prior carotid duplex done 10/28/20 indicating <50% ICA stenosis                    bilaterally, at [facility] Performing Technologist: Sherren Kerns RVS  Examination Guidelines: A complete evaluation includes B-mode imaging, spectral Doppler, color Doppler, and power Doppler as needed of all accessible portions of each vessel. Bilateral testing is considered an integral part of a complete examination. Limited examinations for reoccurring indications may be performed as noted.  Right Carotid Findings: +----------+--------+--------+--------+-------------------------------+--------+           PSV cm/sEDV cm/sStenosisPlaque Description             Comments +----------+--------+--------+--------+-------------------------------+--------+ CCA Prox  49      15              homogeneous                             +----------+--------+--------+--------+-------------------------------+--------+ CCA Distal45      18              homogeneous                             +----------+--------+--------+--------+-------------------------------+--------+ ICA Prox  45      21      1-39%   heterogenous,  focal and                                                   calcific                                +----------+--------+--------+--------+-------------------------------+--------+ ICA Mid   45      20                                                      +----------+--------+--------+--------+-------------------------------+--------+ ICA Distal40      14                                                      +----------+--------+--------+--------+-------------------------------+--------+ ECA       75      17                                                      +----------+--------+--------+--------+-------------------------------+--------+ +----------+--------+-------+--------+-------------------+           PSV cm/sEDV cmsDescribeArm Pressure (mmHG) +----------+--------+-------+--------+-------------------+ Subclavian110                                        +----------+--------+-------+--------+-------------------+ +---------+--------+--+--------+--+ VertebralPSV cm/s43EDV cm/s16 +---------+--------+--+--------+--+  Left Carotid Findings: +----------+--------+--------+--------+-----------------+----------------------+           PSV cm/sEDV cm/sStenosisPlaque  Comments                                                 Description                             +----------+--------+--------+--------+-----------------+----------------------+ CCA Prox  58      21              homogeneous                             +----------+--------+--------+--------+-----------------+----------------------+ CCA Distal57      22              homogeneous                             +----------+--------+--------+--------+-----------------+----------------------+ ICA Prox  87      44      1-39%   heterogenous     shadowing and tortuous +----------+--------+--------+--------+-----------------+----------------------+ ICA Mid   53      24                                                       +----------+--------+--------+--------+-----------------+----------------------+ ICA Distal49      23                                                      +----------+--------+--------+--------+-----------------+----------------------+ ECA       112     29                                                      +----------+--------+--------+--------+-----------------+----------------------+ +----------+--------+--------+--------+-------------------+           PSV cm/sEDV cm/sDescribeArm Pressure (mmHG) +----------+--------+--------+--------+-------------------+ Subclavian214                                         +----------+--------+--------+--------+-------------------+ +---------+--------+--+--------+--+ VertebralPSV cm/s37EDV cm/s16 +---------+--------+--+--------+--+   Summary: Right Carotid: Velocities in the right ICA are consistent with a 1-39% stenosis. Left Carotid: Velocities in the left ICA are consistent with a 1-39% stenosis. Vertebrals:  Bilateral vertebral arteries demonstrate antegrade flow. Subclavians: Normal flow hemodynamics were seen in bilateral subclavian              arteries. *See table(s) above for measurements and observations.  Electronically signed by Sherald Hess MD on 03/04/2023 at 4:57:16 PM.    Final     Results for orders placed or performed during the hospital encounter of 03/06/23 (from the past 48 hours)  Glucose, capillary     Status: Abnormal   Collection Time: 03/20/23 11:11 AM  Result Value Ref Range   Glucose-Capillary 159 (H) 70 -  99 mg/dL    Comment: Glucose reference range applies only to samples taken after fasting for at least 8 hours.  Cooxemetry Panel (carboxy, met, total hgb, O2 sat)     Status: Abnormal   Collection Time: 03/20/23  2:30 PM  Result Value Ref Range   Total hemoglobin 8.5 (L) 12.0 - 16.0 g/dL   O2 Saturation 91.4 %   Carboxyhemoglobin 1.6 (H) 0.5 - 1.5 %    Methemoglobin <0.7 0.0 - 1.5 %    Comment: Performed at [facility], [address], [place], [place] [address]  Basic metabolic panel     Status: Abnormal   Collection Time: 03/20/23  2:42 PM  Result Value Ref Range   Sodium 133 (L) 135 - 145 mmol/L   Potassium 3.9 3.5 - 5.1 mmol/L   Chloride 98 98 - 111 mmol/L   CO2 26 22 - 32 mmol/L   Glucose, Bld 123 (H) 70 - 99 mg/dL    Comment: Glucose reference range applies only to samples taken after fasting for at least 8 hours.   BUN 16 8 - 23 mg/dL   Creatinine, Ser 6.21 0.44 - 1.00 mg/dL   Calcium 8.2 (L) 8.9 - 10.3 mg/dL   GFR, Estimated 58 (L) >60 mL/min    Comment: (NOTE) Calculated using the CKD-EPI Creatinine Equation (2021)    Anion gap 9 5 - 15    Comment: Performed at [facility], [address] [place], [place] [address]  Glucose, capillary     Status: None   Collection Time: 03/20/23  4:22 PM  Result Value Ref Range   Glucose-Capillary 91 70 - 99 mg/dL    Comment: Glucose reference range applies only to samples taken after fasting for at least 8 hours.  Glucose, capillary     Status: Abnormal   Collection Time: 03/20/23  9:18 PM  Result Value Ref Range   Glucose-Capillary 106 (H) 70 - 99 mg/dL    Comment: Glucose reference range applies only to samples taken after fasting for at least 8 hours.   Comment 1 Notify RN    Comment 2 Document in Chart   Protime-INR     Status: Abnormal   Collection Time: 03/21/23  4:00 AM  Result Value Ref Range   Prothrombin Time 27.8 (H) 11.4 - 15.2 seconds   INR 2.6 (H) 0.8 - 1.2    Comment: (NOTE) INR goal varies based on device and disease states. Performed at [facility], [address], [place], [place] [address]   Magnesium     Status: None   Collection Time: 03/21/23  4:00 AM  Result Value Ref Range   Magnesium 2.2 1.7 - 2.4 mg/dL    Comment: Performed at [facility], [address], [place], [place] [address]  Basic metabolic panel     Status:  Abnormal   Collection Time: 03/21/23  4:00 AM  Result Value Ref Range   Sodium 133 (L) 135 - 145 mmol/L   Potassium 3.9 3.5 - 5.1 mmol/L   Chloride 97 (L) 98 - 111 mmol/L   CO2 27 22 - 32 mmol/L   Glucose, Bld 117 (H) 70 - 99 mg/dL    Comment: Glucose reference range applies only to samples taken after fasting for at least 8 hours.   BUN 16 8 - 23 mg/dL   Creatinine, Ser 8.41 0.44 - 1.00 mg/dL   Calcium 8.3 (L) 8.9 - 10.3 mg/dL   GFR,  Estimated >60 >60 mL/min    Comment: (NOTE) Calculated using the CKD-EPI Creatinine Equation (2021)    Anion gap 9 5 - 15    Comment: Performed at [facility], [address] [place], [place] [address]  Cooxemetry Panel (carboxy, met, total hgb, O2 sat)     Status: Abnormal   Collection Time: 03/21/23  4:00 AM  Result Value Ref Range   Total hemoglobin 8.3 (L) 12.0 - 16.0 g/dL   O2 Saturation 60.4 %   Carboxyhemoglobin 2.1 (H) 0.5 - 1.5 %   Methemoglobin <0.7 0.0 - 1.5 %    Comment: Performed at [facility], [address], [place], [place] [address]  Glucose, capillary     Status: Abnormal   Collection Time: 03/21/23  6:39 AM  Result Value Ref Range   Glucose-Capillary 102 (H) 70 - 99 mg/dL    Comment: Glucose reference range applies only to samples taken after fasting for at least 8 hours.   Comment 1 Notify RN    Comment 2 Document in Chart   CBC     Status: Abnormal   Collection Time: 03/21/23  9:09 AM  Result Value Ref Range   WBC 9.9 4.0 - 10.5 K/uL   RBC 2.82 (L) 3.87 - 5.11 MIL/uL   Hemoglobin 8.3 (L) 12.0 - 15.0 g/dL   HCT 11.9 (L) 14.7 - 82.9 %   MCV 94.7 80.0 - 100.0 fL   MCH 29.4 26.0 - 34.0 pg   MCHC 31.1 30.0 - 36.0 g/dL   RDW 56.2 13.0 - 86.5 %   Platelets 503 (H) 150 - 400 K/uL   nRBC 0.0 0.0 - 0.2 %    Comment: Performed at [facility], [address] [place], [place] [address]  Glucose, capillary     Status: Abnormal   Collection Time: 03/21/23 11:23 AM  Result Value Ref Range   Glucose-Capillary  112 (H) 70 - 99 mg/dL    Comment: Glucose reference range applies only to samples taken after fasting for at least 8 hours.  Glucose, capillary     Status: Abnormal   Collection Time: 03/21/23  4:07 PM  Result Value Ref Range   Glucose-Capillary 115 (H) 70 - 99 mg/dL    Comment: Glucose reference range applies only to samples taken after fasting for at least 8 hours.  Glucose, capillary     Status: Abnormal   Collection Time: 03/21/23  9:24 PM  Result Value Ref Range   Glucose-Capillary 115 (H) 70 - 99 mg/dL    Comment: Glucose reference range applies only to samples taken after fasting for at least 8 hours.   Comment 1 Notify RN    Comment 2 Document in Chart   Protime-INR     Status: Abnormal   Collection Time: 03/22/23  5:00 AM  Result Value Ref Range   Prothrombin Time 26.9 (H) 11.4 - 15.2 seconds   INR 2.5 (H) 0.8 - 1.2    Comment: (NOTE) INR goal varies based on device and disease states. Performed at [facility], [address], [place], [place] [address]   Cooxemetry Panel (carboxy, met, total hgb, O2 sat)     Status: Abnormal   Collection Time: 03/22/23  5:00 AM  Result Value Ref Range   Total hemoglobin 8.4 (L) 12.0 - 16.0 g/dL   O2 Saturation 84.1 %   Carboxyhemoglobin 1.9 (H) 0.5 - 1.5 %   Methemoglobin <0.7 0.0 - 1.5 %  Comment: Performed at [facility], [address] [place], [place] [address]  Basic metabolic panel     Status: Abnormal   Collection Time: 03/22/23  5:00 AM  Result Value Ref Range   Sodium 136 135 - 145 mmol/L   Potassium 4.1 3.5 - 5.1 mmol/L   Chloride 102 98 - 111 mmol/L   CO2 26 22 - 32 mmol/L   Glucose, Bld 98 70 - 99 mg/dL    Comment: Glucose reference range applies only to samples taken after fasting for at least 8 hours.   BUN 17 8 - 23 mg/dL   Creatinine, Ser 6.04 0.44 - 1.00 mg/dL   Calcium 8.4 (L) 8.9 - 10.3 mg/dL   GFR, Estimated >54 >09 mL/min    Comment: (NOTE) Calculated using the CKD-EPI Creatinine Equation  (2021)    Anion gap 8 5 - 15    Comment: Performed at [facility], [address], [place], [place] [address]  CBC     Status: Abnormal   Collection Time: 03/22/23  5:00 AM  Result Value Ref Range   WBC 8.4 4.0 - 10.5 K/uL   RBC 2.67 (L) 3.87 - 5.11 MIL/uL   Hemoglobin 8.0 (L) 12.0 - 15.0 g/dL   HCT 47.8 (L) 29.5 - 62.1 %   MCV 93.3 80.0 - 100.0 fL   MCH 30.0 26.0 - 34.0 pg   MCHC 32.1 30.0 - 36.0 g/dL   RDW 30.8 65.7 - 84.6 %   Platelets 501 (H) 150 - 400 K/uL   nRBC 0.0 0.0 - 0.2 %    Comment: Performed at [facility], [address], [place], [place] [address]  Magnesium     Status: None   Collection Time: 03/22/23  5:00 AM  Result Value Ref Range   Magnesium 2.3 1.7 - 2.4 mg/dL    Comment: Performed at [facility], [address] [place], [place] [address]  Glucose, capillary     Status: None   Collection Time: 03/22/23  6:14 AM  Result Value Ref Range   Glucose-Capillary 83 70 - 99 mg/dL    Comment: Glucose reference range applies only to samples taken after fasting for at least 8 hours.    Treatments: surgery:  and DC cardioversion  CARDIOVASCULAR SURGERY OPERATIVE NOTE   03/06/2023 Rocco Serene [id number]   Surgeon:  Ashley Akin, MD   First Assistant: Gershon Crane [facility]                                 Preoperative Diagnosis:  Rheumatic severe mitral regurgitation                                             Tricuspid regurgitation                                              Atrial fibrillation                                              Previous CABG  Postoperative Diagnosis:  Same     Procedure: Redo Sternotomy Mitral valve replacement with a 29mm Mosaic Tissue Valve prosthesis Tricuspid ring annuloplasty with a  30mm MC3 edward tricuspid ring   Anesthesia:  General Endotracheal    Discharge Exam: Blood pressure 109/61, pulse (!) 54, temperature 97.7 F (36.5 C), temperature source Oral, resp. rate 12, height 5\' 1"  (1.549 m),  weight 60.2 kg, SpO2 95%.  General appearance: alert, cooperative, and no distress Heart: RRR, no murmur Lungs: mildly dim in bases Abdomen: benign Extremities: no edema Wound: incis healing well   Discharge Medications:  The patient has been discharged on:   1.Beta Blocker:  Yes [   y]                              No   [   ]                              If No, reason:  2.Ace Inhibitor/ARB: Yes [  y ]                                     No  [    ]                                     If No, reason:  3.Statin:   Yes [ y  ]                  No  [   ]                  If No, reason:  4.Ecasa:  Yes  Cove.Etienne   ]                  No   [   ]                  If No, reason:  Patient had ACS upon admission:n  Plavix/P2Y12 inhibitor: Yes [   ]                                      No  [ n  ]  On Coumadin     Discharge Instructions     Amb Referral to Cardiac Rehabilitation   Complete by: As directed    Diagnosis:  Valve Replacement Valve Repair     Valve:  Mitral Tricuspid     After initial evaluation and assessments completed: Virtual Based Care may be provided alone or in conjunction with Phase 2 Cardiac Rehab based on patient barriers.: Yes   Intensive Cardiac Rehabilitation (ICR) MC location only OR Traditional Cardiac Rehabilitation (TCR) *If criteria for ICR are not met will enroll in TCR [facility] only): Yes   Discharge patient   Complete by: As directed    Discharge disposition: 01-Home or Self Care   Discharge patient date: 03/22/2023      Allergies as of 03/22/2023       Reactions   Codeine Nausea And Vomiting        Medication List     STOP taking these  medications    apixaban 5 MG Tabs tablet Commonly known as: Eliquis   Entresto 24-26 MG Generic drug: sacubitril-valsartan   metoprolol succinate 25 MG 24 hr tablet Commonly known as: TOPROL-XL   nitroGLYCERIN 0.4 MG SL tablet Commonly known as: NITROSTAT       TAKE these medications     acetaminophen 500 MG tablet Commonly known as: TYLENOL Take 500-1,000 mg by mouth every 6 (six) hours as needed for moderate pain (pain score 4-6) or headache.   albuterol 108 (90 Base) MCG/ACT inhaler Commonly known as: VENTOLIN HFA Inhale 2 puffs into the lungs every 6 (six) hours as needed for wheezing or shortness of breath.   amiodarone 200 MG tablet Commonly known as: PACERONE Take 1 tablet (200 mg total) by mouth 2 (two) times daily. For 7 days, then 200 mg once daily What changed:  when to take this additional instructions   aspirin 81 MG chewable tablet Chew 1 tablet (81 mg total) by mouth daily. Start taking on: March 23, 2023   atorvastatin 80 MG tablet Commonly known as: LIPITOR Take 1 tablet (80 mg total) by mouth daily.   empagliflozin 10 MG Tabs tablet Commonly known as: Jardiance TAKE 1 TABLET BY MOUTH EVERY DAY BEFORE BREAKFAST   ezetimibe 10 MG tablet Commonly known as: ZETIA Take 1 tablet (10 mg total) by mouth daily.   furosemide 40 MG tablet Commonly known as: LASIX Take 1 tablet (40 mg total) by mouth daily. Start taking on: March 23, 2023 What changed: Another medication with the same name was removed. Continue taking this medication, and follow the directions you see here.   losartan 25 MG tablet Commonly known as: COZAAR Take 1 tablet (25 mg total) by mouth daily. Start taking on: March 23, 2023   oxyCODONE 5 MG immediate release tablet Commonly known as: Oxy IR/ROXICODONE Take 1 tablet (5 mg total) by mouth every 6 (six) hours as needed for up to 7 days for severe pain (pain score 7-10).   potassium chloride SA 20 MEQ tablet Commonly known as: KLOR-CON M Take 1 tablet (20 mEq total) by mouth daily.   spironolactone 25 MG tablet Commonly known as: ALDACTONE Take 1 tablet (25 mg total) by mouth daily. Start taking on: March 23, 2023 What changed: how much to take   warfarin 2 MG tablet Commonly known as: COUMADIN Take 2  tablets (4 mg total) by mouth daily. Adjust as directed by coumadin clinic               Durable Medical Equipment  (From admission, onward)           Start     Ordered   03/15/23 1704  For home use only DME 3 n 1  Once        03/15/23 1703   03/13/23 1106  For home use only DME 4 wheeled rolling walker with seat  Once       Question:  Patient needs a walker to treat with the following condition  Answer:  S/P MVR (mitral valve replacement)   03/13/23 1108            Follow-up Information     Eugenio Hoes, MD Follow up.   Specialty: Cardiothoracic Surgery Why: Please see discharge paperwork for details of follow-up appointment with surgeon. Contact information: [address] [place] [address] [phone]         [facility] AT [address] Follow up.  Specialty: Radiology Why: On the date you are scheduled to be seen and surgeons office obtain a chest x-ray at [facility] 1 hour prior to that appointment. Contact information: [address] [place] [address] [phone]        Charlsie Quest, NP Follow up.   Specialty: Cardiology Why: Hospital follow-up with Cardiologist scheduled for 03/27/2023 at 10:30am. Please arrive 15 minutes early for check-in. If this date/ time does not work for you, please call our office to reschedule. Contact information: [address] [place] [address] [phone]         [facility] Follow up.   Specialty: Cardiology Why: Post-op echocardiogram scheduled for 04/17/2023 at 10:30am. Please arrive 15 minute early for check-in. If this date/ time does not work for you, please call our office to reschedule. Contact information: [address] [place] [address] [phone]        [facility] Follow up.   Why: HHPT/OT arranged- they will  contact you to schedule Contact information: [address] [place] [address] [phone]         [facility] Follow up.   Why: [employer] arranged- to be delivered to room prior to discharge Contact information: [address] [place] [address] [phone]         Corky Downs, MD Follow up.   Specialties: Internal Medicine, Cardiology Why: please call to establish with PCP Contact information: [address] [place] [address] [phone]                 Signed:  Rowe Clack, PA-C  03/22/2023, 11:05 AM

## 2023-03-08 ENCOUNTER — Other Ambulatory Visit: Payer: Self-pay | Admitting: Student

## 2023-03-08 ENCOUNTER — Inpatient Hospital Stay (HOSPITAL_COMMUNITY): Payer: 59

## 2023-03-08 DIAGNOSIS — Z952 Presence of prosthetic heart valve: Secondary | ICD-10-CM

## 2023-03-08 LAB — GLUCOSE, CAPILLARY
Glucose-Capillary: 106 mg/dL — ABNORMAL HIGH (ref 70–99)
Glucose-Capillary: 118 mg/dL — ABNORMAL HIGH (ref 70–99)
Glucose-Capillary: 138 mg/dL — ABNORMAL HIGH (ref 70–99)
Glucose-Capillary: 141 mg/dL — ABNORMAL HIGH (ref 70–99)
Glucose-Capillary: 146 mg/dL — ABNORMAL HIGH (ref 70–99)
Glucose-Capillary: 85 mg/dL (ref 70–99)

## 2023-03-08 LAB — COOXEMETRY PANEL
Carboxyhemoglobin: 1.5 % (ref 0.5–1.5)
Methemoglobin: <0.7 % (ref 0.0–1.5)
O2 Saturation: 55.5 %
Total hemoglobin: 9.3 g/dL — ABNORMAL LOW (ref 12.0–16.0)

## 2023-03-08 LAB — CBC
HCT: 27.8 % — ABNORMAL LOW (ref 36.0–46.0)
Hemoglobin: 9.2 g/dL — ABNORMAL LOW (ref 12.0–15.0)
MCH: 31.3 pg (ref 26.0–34.0)
MCHC: 33.1 g/dL (ref 30.0–36.0)
MCV: 94.6 fL (ref 80.0–100.0)
Platelets: 129 10*3/uL — ABNORMAL LOW (ref 150–400)
RBC: 2.94 MIL/uL — ABNORMAL LOW (ref 3.87–5.11)
RDW: 14.3 % (ref 11.5–15.5)
WBC: 19.7 10*3/uL — ABNORMAL HIGH (ref 4.0–10.5)
nRBC: 0 % (ref 0.0–0.2)

## 2023-03-08 LAB — BASIC METABOLIC PANEL
Anion gap: 10 (ref 5–15)
BUN: 19 mg/dL (ref 8–23)
CO2: 21 mmol/L — ABNORMAL LOW (ref 22–32)
Calcium: 8.3 mg/dL — ABNORMAL LOW (ref 8.9–10.3)
Chloride: 103 mmol/L (ref 98–111)
Creatinine, Ser: 1.21 mg/dL — ABNORMAL HIGH (ref 0.44–1.00)
GFR, Estimated: 46 mL/min — ABNORMAL LOW (ref >60)
Glucose, Bld: 144 mg/dL — ABNORMAL HIGH (ref 70–99)
Potassium: 4 mmol/L (ref 3.5–5.1)
Sodium: 134 mmol/L — ABNORMAL LOW (ref 135–145)

## 2023-03-08 LAB — MAGNESIUM: Magnesium: 2.3 mg/dL (ref 1.7–2.4)

## 2023-03-08 MED ORDER — POTASSIUM CHLORIDE CRYS ER 20 MEQ PO TBCR
20.0000 meq | EXTENDED_RELEASE_TABLET | Freq: Once | ORAL | Status: AC
  Administered 2023-03-08: 20 meq via ORAL
  Filled 2023-03-08: qty 1

## 2023-03-08 MED ORDER — FUROSEMIDE 10 MG/ML IJ SOLN
40.0000 mg | Freq: Two times a day (BID) | INTRAMUSCULAR | Status: DC
  Administered 2023-03-08 – 2023-03-12 (×9): 40 mg via INTRAVENOUS
  Filled 2023-03-08 (×9): qty 4

## 2023-03-08 NOTE — Progress Notes (Signed)
Ordered 6 week post-op Echo following mitral valve replacement and tricuspid valve repair per protocol. Will need a structural reader.  Primary Cardiologist: Dr. Mariah Milling CT Surgeon: Dr. Debarah Crape, PA-C 03/08/2023 9:25 AM

## 2023-03-08 NOTE — Progress Notes (Addendum)
03/08/2023 Sleeping. BMP stable Stable CBC CXR looks a little wet, standing lasix ordered Epi being weaned, pacer adjusted to V paced Coumadin to start tomorrow No major changes from PCCM standpoint.  Myrla Halsted MD PCCM

## 2023-03-08 NOTE — Progress Notes (Signed)
      [address]       [place] [address]             [phone]    POD # 2 MVR, TV repair  BP 114/66   Pulse 80   Temp 97.8 F (36.6 C) (Oral)   Resp 11   Ht 5\' 1"  (1.549 m)   Wt 66 kg   SpO2 94%   BMI 27.49 kg/m  [name] 8L/min 51/15 CI 2.14  Intake/Output Summary (Last 24 hours) at 03/08/2023 1650 Last data filed at 03/08/2023 1300 Gross per 24 hour  Intake 138.7 ml  Output 1425 ml  Net -1286.3 ml   CBG well controlled  Continue diuresis Start coumadin tomorrow  Viviann Spare C. Dorris Fetch, MD [facility] [phone]

## 2023-03-08 NOTE — Progress Notes (Signed)
[address]       [name] [address]             [phone]      2 Days Post-Op  Procedure(s) (LRB): REDO STERNOTOMY (N/A) MITRAL VALVE (MV) REPLACEMENT, USING 29 MM MEDTRONIC MOSAIC PORCINE HEART VALVE (N/A) TRANSESOPHAGEAL ECHOCARDIOGRAM (N/A) TRICUSPID VALVE REPAIR, USING 30 MM MC3 ANNULOPLASTY RING   Total Length of Stay:  LOS: 2 days    SUBJECTIVE: Says she feels better today   Vitals:   03/08/23 0600 03/08/23 0700  BP:    Pulse: (!) 56 (!) 57  Resp: 16 20  Temp: 98.8 F (37.1 C) 99 F (37.2 C)  SpO2: 90% 93%    Intake/Output      01/16 0701 01/17 0700 01/17 0701 01/18 0700   P.O. 250    I.V. (mL/kg) 295.8 (4.5)    Blood     IV Piggyback 100    Total Intake(mL/kg) 645.8 (9.8)    Urine (mL/kg/hr) 1605 (1)    Chest Tube 60    Total Output 1665    Net -1019.2             albumin human     cefTRIAXone (ROCEPHIN)  IV     epinephrine 1 mcg/min (03/08/23 0700)   insulin Stopped (03/07/23 0756)   milrinone 0.25 mcg/kg/min (03/08/23 0700)   norepinephrine (LEVOPHED) Adult infusion Stopped (03/06/23 2026)    CBC    Component Value Date/Time   WBC 19.7 (H) 03/08/2023 0411   RBC 2.94 (L) 03/08/2023 0411   HGB 9.2 (L) 03/08/2023 0411   HGB 13.9 01/16/2023 0908   HCT 27.8 (L) 03/08/2023 0411   HCT 43.0 01/16/2023 0908   PLT 129 (L) 03/08/2023 0411   PLT 324 01/16/2023 0908   MCV 94.6 03/08/2023 0411   MCV 98 (H) 01/16/2023 0908   MCH 31.3 03/08/2023 0411   MCHC 33.1 03/08/2023 0411   RDW 14.3 03/08/2023 0411   RDW 12.0 01/16/2023 0908   LYMPHSABS 1.8 11/21/2020 1518   MONOABS 1.6 (H) 10/28/2020 0655   EOSABS 0.2 11/21/2020 1518   BASOSABS 0.1 11/21/2020 1518   CMP     Component Value Date/Time   NA 134 (L) 03/08/2023 0411   NA 143 02/05/2023 1130   K 4.0 03/08/2023 0411   CL 103 03/08/2023 0411   CO2 21 (L) 03/08/2023 0411   GLUCOSE 144 (H) 03/08/2023 0411   BUN 19 03/08/2023 0411   BUN 14 02/05/2023 1130    CREATININE 1.21 (H) 03/08/2023 0411   CALCIUM 8.3 (L) 03/08/2023 0411   PROT 7.0 03/04/2023 1430   ALBUMIN 4.0 03/04/2023 1430   AST 21 03/04/2023 1430   ALT 25 03/04/2023 1430   ALKPHOS 102 03/04/2023 1430   BILITOT 1.1 03/04/2023 1430   GFRNONAA 46 (L) 03/08/2023 0411   ABG    Component Value Date/Time   PHART 7.373 03/07/2023 0506   PCO2ART 38.3 03/07/2023 0506   PO2ART 60 (L) 03/07/2023 0506   HCO3 22.2 03/07/2023 0506   TCO2 23 03/07/2023 0506   ACIDBASEDEF 3.0 (H) 03/07/2023 0506   O2SAT 55.5 03/08/2023 0411   CBG (last 3)  Recent Labs    03/07/23 2033 03/07/23 2351 03/08/23 0432  GLUCAP 128* 127* 138*  EXAM Lungs: rhonchorous Card: IRR no murmur Ext: Warm Neuro: Intact   ASSESSMENT: POD #2 sp MV replacement TV repair Hemodynamics ok on epi milrinone. Will VVI at 80 today and  work on getting of epi then milrinone Remove aline and Swan after off inotropes. DC Chest tubes OOB to chair Will start anticoagulation tomorrow with coumadin Diurese today  Eugenio Hoes, MD 03/08/2023

## 2023-03-08 NOTE — Plan of Care (Signed)
  Problem: Education: Goal: Knowledge of General Education information will improve Description: Including pain rating scale, medication(s)/side effects and non-pharmacologic comfort measures Outcome: Progressing   Problem: Health Behavior/Discharge Planning: Goal: Ability to manage health-related needs will improve Outcome: Progressing   Problem: Clinical Measurements: Goal: Ability to maintain clinical measurements within normal limits will improve Outcome: Progressing Goal: Will remain free from infection Outcome: Progressing Goal: Diagnostic test results will improve Outcome: Progressing Goal: Respiratory complications will improve Outcome: Progressing Goal: Cardiovascular complication will be avoided Outcome: Progressing   Problem: Activity: Goal: Risk for activity intolerance will decrease Outcome: Progressing   Problem: Nutrition: Goal: Adequate nutrition will be maintained Outcome: Progressing   Problem: Coping: Goal: Level of anxiety will decrease Outcome: Progressing   Problem: Elimination: Goal: Will not experience complications related to bowel motility Outcome: Progressing Goal: Will not experience complications related to urinary retention Outcome: Progressing   Problem: Pain Management: Goal: General experience of comfort will improve Outcome: Progressing   Problem: Safety: Goal: Ability to remain free from injury will improve Outcome: Progressing   Problem: Skin Integrity: Goal: Risk for impaired skin integrity will decrease Outcome: Progressing   Problem: Education: Goal: Will demonstrate proper wound care and an understanding of methods to prevent future damage Outcome: Progressing Goal: Knowledge of disease or condition will improve Outcome: Progressing Goal: Knowledge of the prescribed therapeutic regimen will improve Outcome: Progressing   Problem: Activity: Goal: Risk for activity intolerance will decrease Outcome: Progressing    Problem: Cardiac: Goal: Will achieve and/or maintain hemodynamic stability Outcome: Progressing   Problem: Clinical Measurements: Goal: Postoperative complications will be avoided or minimized Outcome: Progressing   Problem: Respiratory: Goal: Respiratory status will improve Outcome: Progressing   Problem: Skin Integrity: Goal: Wound healing without signs and symptoms of infection Outcome: Progressing Goal: Risk for impaired skin integrity will decrease Outcome: Progressing   Problem: Urinary Elimination: Goal: Ability to achieve and maintain adequate renal perfusion and functioning will improve Outcome: Progressing

## 2023-03-08 NOTE — Progress Notes (Signed)
   03/08/23 2359  BiPAP/CPAP/SIPAP  BiPAP/CPAP/SIPAP Pt Type Adult  BiPAP/CPAP/SIPAP SERVO  Reason BIPAP/CPAP not in use Non-compliant

## 2023-03-09 ENCOUNTER — Inpatient Hospital Stay (HOSPITAL_COMMUNITY): Payer: 59

## 2023-03-09 DIAGNOSIS — R570 Cardiogenic shock: Secondary | ICD-10-CM | POA: Diagnosis not present

## 2023-03-09 LAB — BASIC METABOLIC PANEL
Anion gap: 9 (ref 5–15)
BUN: 27 mg/dL — ABNORMAL HIGH (ref 8–23)
CO2: 24 mmol/L (ref 22–32)
Calcium: 8.4 mg/dL — ABNORMAL LOW (ref 8.9–10.3)
Chloride: 100 mmol/L (ref 98–111)
Creatinine, Ser: 1.26 mg/dL — ABNORMAL HIGH (ref 0.44–1.00)
GFR, Estimated: 44 mL/min — ABNORMAL LOW (ref >60)
Glucose, Bld: 124 mg/dL — ABNORMAL HIGH (ref 70–99)
Potassium: 4.1 mmol/L (ref 3.5–5.1)
Sodium: 133 mmol/L — ABNORMAL LOW (ref 135–145)

## 2023-03-09 LAB — CBC
HCT: 28.2 % — ABNORMAL LOW (ref 36.0–46.0)
Hemoglobin: 9.3 g/dL — ABNORMAL LOW (ref 12.0–15.0)
MCH: 30.8 pg (ref 26.0–34.0)
MCHC: 33 g/dL (ref 30.0–36.0)
MCV: 93.4 fL (ref 80.0–100.0)
Platelets: 131 10*3/uL — ABNORMAL LOW (ref 150–400)
RBC: 3.02 MIL/uL — ABNORMAL LOW (ref 3.87–5.11)
RDW: 13.9 % (ref 11.5–15.5)
WBC: 14.8 10*3/uL — ABNORMAL HIGH (ref 4.0–10.5)
nRBC: 0 % (ref 0.0–0.2)

## 2023-03-09 LAB — MAGNESIUM: Magnesium: 2.1 mg/dL (ref 1.7–2.4)

## 2023-03-09 LAB — GLUCOSE, CAPILLARY
Glucose-Capillary: 112 mg/dL — ABNORMAL HIGH (ref 70–99)
Glucose-Capillary: 100 mg/dL — ABNORMAL HIGH (ref 70–99)
Glucose-Capillary: 127 mg/dL — ABNORMAL HIGH (ref 70–99)
Glucose-Capillary: 100 mg/dL — ABNORMAL HIGH (ref 70–99)

## 2023-03-09 LAB — COOXEMETRY PANEL
Carboxyhemoglobin: 1.7 % — ABNORMAL HIGH (ref 0.5–1.5)
Methemoglobin: <0.7 % (ref 0.0–1.5)
O2 Saturation: 57.8 %
Total hemoglobin: 9.5 g/dL — ABNORMAL LOW (ref 12.0–16.0)

## 2023-03-09 MED ORDER — LACTULOSE 10 GM/15ML PO SOLN
20.0000 g | Freq: Every day | ORAL | Status: DC | PRN
  Administered 2023-03-09 – 2023-03-17 (×3): 20 g via ORAL
  Filled 2023-03-09 (×3): qty 30

## 2023-03-09 MED ORDER — INSULIN ASPART 100 UNIT/ML IJ SOLN
0.0000 [IU] | Freq: Three times a day (TID) | INTRAMUSCULAR | Status: DC
  Administered 2023-03-11 – 2023-03-15 (×7): 2 [IU] via SUBCUTANEOUS

## 2023-03-09 MED ORDER — POLYETHYLENE GLYCOL 3350 17 G PO PACK
17.0000 g | PACK | Freq: Every day | ORAL | Status: DC
  Administered 2023-03-09 – 2023-03-18 (×7): 17 g via ORAL
  Filled 2023-03-09 (×8): qty 1

## 2023-03-09 MED ORDER — ENSURE ENLIVE PO LIQD
237.0000 mL | Freq: Two times a day (BID) | ORAL | Status: DC
  Administered 2023-03-12 – 2023-03-17 (×11): 237 mL via ORAL
  Filled 2023-03-09: qty 237

## 2023-03-09 MED ORDER — METOCLOPRAMIDE HCL 5 MG/ML IJ SOLN
10.0000 mg | Freq: Four times a day (QID) | INTRAMUSCULAR | Status: AC
  Administered 2023-03-09 – 2023-03-10 (×4): 10 mg via INTRAVENOUS
  Filled 2023-03-09 (×4): qty 2

## 2023-03-09 NOTE — Progress Notes (Signed)
   NAME:  Penny Mccullough, MRN:  [id number], DOB:  10-06-1945, LOS: 3 ADMISSION DATE:  03/06/2023, CONSULTATION DATE: 03/06/2023 REFERRING MD: Eugenio Hoes, CHIEF COMPLAINT: Status post mitral valve replacement/tricuspid valve repair  History of Present Illness:  78 year old female with COPD, ex-smoker coronary artery disease status post CABG and MI status post LAD stent, paroxysmal A-fib on Eliquis and amiodarone and rheumatic mitral valve disease who underwent workup for exertional dyspnea, noted to have severe mitral regurgitation and tricuspid regurgitation, today she underwent mitral valve replacement and tricuspid valve repair.  She was transferred to ICU postop, PCCM was consulted for help evaluation medical management  Pertinent  Medical History   Past Medical History:  Diagnosis Date   CAD (coronary artery disease)    CHF (congestive heart failure) (HCC)    COPD (chronic obstructive pulmonary disease) (HCC)    CVA (cerebral vascular accident) (HCC)    Dysrhythmia    A.Fib   Heart murmur    History of kidney stones    HLD (hyperlipidemia)    Hypertension    MI (myocardial infarction) (HCC) 10/2020     Significant Hospital Events: Including procedures, antibiotic start and stop dates in addition to other pertinent events     Interim History / Subjective:  C/o nausea, +flatus Otherwise doing okay Still on fair bit of O2  Objective   Blood pressure 114/62, pulse 80, temperature 97.9 F (36.6 C), temperature source Axillary, resp. rate 15, height 5\' 1"  (1.549 m), weight 65.2 kg, SpO2 92%. 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

## 2023-03-09 NOTE — Progress Notes (Signed)
   03/09/23 2046  BiPAP/CPAP/SIPAP  $ Non-Invasive Ventilator  Non-Invasive Vent Subsequent  BiPAP/CPAP/SIPAP Pt Type Adult  BiPAP/CPAP/SIPAP SERVO  Reason BIPAP/CPAP not in use Non-compliant  Mask Type Full face mask  Mask Size Medium  BiPAP/CPAP /SiPAP Vitals  BP 93/67  Bilateral Breath Sounds Diminished  MEWS Score/Color  MEWS Score 1  MEWS Score Color [place] Si

## 2023-03-09 NOTE — Progress Notes (Signed)
3 Days Post-Op Procedure(s) (LRB): REDO STERNOTOMY (N/A) MITRAL VALVE (MV) REPLACEMENT, USING 29 MM MEDTRONIC MOSAIC PORCINE HEART VALVE (N/A) TRANSESOPHAGEAL ECHOCARDIOGRAM (N/A) TRICUSPID VALVE REPAIR, USING 30 MM MC3 ANNULOPLASTY RING Subjective: C/o nausea  Objective: Vital signs in last 24 hours: Temp:  [97 F (36.1 C)-98.4 F (36.9 C)] 98.1 F (36.7 C) (01/18 0800) Pulse Rate:  [78-84] 79 (01/18 0500) Cardiac Rhythm: Ventricular paced (01/17 2000) Resp:  [8-24] 14 (01/18 0500) BP: (95-122)/(59-87) 117/76 (01/18 0500) SpO2:  [88 %-98 %] 95 % (01/18 0500) Weight:  [65.2 kg] 65.2 kg (01/18 0530)  Hemodynamic parameters for last 24 hours: PAP: (44-71)/(18-32) 58/26 CVP:  [9 mmHg-25 mmHg] 25 mmHg  Intake/Output from previous day: 01/17 0701 - 01/18 0700 In: 125.4 [I.V.:25.4; IV Piggyback:100] Out: 1510 [Urine:1510] Intake/Output this shift: No intake/output data recorded.  General appearance: alert, cooperative, and mild distress Neurologic: intact Heart: regular rate and rhythm Lungs: diminished breath sounds bibasilar Abdomen: distended, nontender  Lab Results: Recent Labs    03/08/23 0411 03/09/23 0311  WBC 19.7* 14.8*  HGB 9.2* 9.3*  HCT 27.8* 28.2*  PLT 129* 131*   BMET:  Recent Labs    03/08/23 0411 03/09/23 0311  NA 134* 133*  K 4.0 4.1  CL 103 100  CO2 21* 24  GLUCOSE 144* 124*  BUN 19 27*  CREATININE 1.21* 1.26*  CALCIUM 8.3* 8.4*    PT/INR:  Recent Labs    03/06/23 1245  LABPROT 18.3*  INR 1.5*   ABG    Component Value Date/Time   PHART 7.373 03/07/2023 0506   HCO3 22.2 03/07/2023 0506   TCO2 23 03/07/2023 0506   ACIDBASEDEF 3.0 (H) 03/07/2023 0506   O2SAT 57.8 03/09/2023 0311   CBG (last 3)  Recent Labs    03/08/23 2354 03/09/23 0315 03/09/23 0804  GLUCAP 85 112* 100*    Assessment/Plan: S/P Procedure(s) (LRB): REDO STERNOTOMY (N/A) MITRAL VALVE (MV) REPLACEMENT, USING 29 MM MEDTRONIC MOSAIC PORCINE HEART VALVE  (N/A) TRANSESOPHAGEAL ECHOCARDIOGRAM (N/A) TRICUSPID VALVE REPAIR, USING 30 MM MC3 ANNULOPLASTY RING - NEURO- intact CV- paced at 80 bpm  BP OK  Co-ox 58 off inotropes  On baby  aspirin,to start warfarin today RESP- atelectasis + interstitial edema  On 10L HFNC  Diurese, pulmonary hygiene RENAL- creatinine stable  Mild hyponatremia- monitor  Diurese ENDO- CBG well controlled  Change SSI to AC and HS GI- nausea persists  Mildly distended with hypoactive BS c/w ileus  Will restart Reglan IV  Diet as tolerated ID- on ceftriaxone for UTI Deconditioning- cardiac rehab    LOS: 3 days    Loreli Slot 03/09/2023

## 2023-03-09 NOTE — Progress Notes (Signed)
      [address]       [name] [address]             [phone]      Just back from walking in hallway  BP 111/69   Pulse 80   Temp 97.7 F (36.5 C) (Axillary)   Resp 16   Ht 5\' 1"  (1.549 m)   Wt 65.2 kg   SpO2 91%   BMI 27.16 kg/m    Intake/Output Summary (Last 24 hours) at 03/09/2023 1747 Last data filed at 03/09/2023 1200 Gross per 24 hour  Intake 460 ml  Output 1075 ml  Net -615 ml  Still on HFNC  Quinlynn Cuthbert C. Dorris Fetch, MD [facility] [phone]

## 2023-03-10 ENCOUNTER — Inpatient Hospital Stay (HOSPITAL_COMMUNITY): Payer: 59

## 2023-03-10 DIAGNOSIS — R570 Cardiogenic shock: Secondary | ICD-10-CM | POA: Diagnosis not present

## 2023-03-10 LAB — CBC
HCT: 28.7 % — ABNORMAL LOW (ref 36.0–46.0)
Hemoglobin: 9.5 g/dL — ABNORMAL LOW (ref 12.0–15.0)
MCH: 30.9 pg (ref 26.0–34.0)
MCHC: 33.1 g/dL (ref 30.0–36.0)
MCV: 93.5 fL (ref 80.0–100.0)
Platelets: 194 10*3/uL (ref 150–400)
RBC: 3.07 MIL/uL — ABNORMAL LOW (ref 3.87–5.11)
RDW: 14.1 % (ref 11.5–15.5)
WBC: 12.5 10*3/uL — ABNORMAL HIGH (ref 4.0–10.5)
nRBC: 0 % (ref 0.0–0.2)

## 2023-03-10 LAB — COOXEMETRY PANEL
Carboxyhemoglobin: 0.6 % (ref 0.5–1.5)
Methemoglobin: <0.7 % (ref 0.0–1.5)
O2 Saturation: 63.9 %
Total hemoglobin: 10.7 g/dL — ABNORMAL LOW (ref 12.0–16.0)

## 2023-03-10 LAB — GLUCOSE, CAPILLARY
Glucose-Capillary: 154 mg/dL — ABNORMAL HIGH (ref 70–99)
Glucose-Capillary: 101 mg/dL — ABNORMAL HIGH (ref 70–99)
Glucose-Capillary: 113 mg/dL — ABNORMAL HIGH (ref 70–99)

## 2023-03-10 LAB — BASIC METABOLIC PANEL
Anion gap: 12 (ref 5–15)
BUN: 31 mg/dL — ABNORMAL HIGH (ref 8–23)
CO2: 25 mmol/L (ref 22–32)
Calcium: 8.1 mg/dL — ABNORMAL LOW (ref 8.9–10.3)
Chloride: 97 mmol/L — ABNORMAL LOW (ref 98–111)
Creatinine, Ser: 1.2 mg/dL — ABNORMAL HIGH (ref 0.44–1.00)
GFR, Estimated: 47 mL/min — ABNORMAL LOW (ref >60)
Glucose, Bld: 131 mg/dL — ABNORMAL HIGH (ref 70–99)
Potassium: 3.4 mmol/L — ABNORMAL LOW (ref 3.5–5.1)
Sodium: 134 mmol/L — ABNORMAL LOW (ref 135–145)

## 2023-03-10 LAB — MAGNESIUM: Magnesium: 2 mg/dL (ref 1.7–2.4)

## 2023-03-10 LAB — PROTIME-INR
INR: 1.1 (ref 0.8–1.2)
Prothrombin Time: 14.3 s (ref 11.4–15.2)

## 2023-03-10 LAB — PROCALCITONIN: Procalcitonin: 0.72 ng/mL

## 2023-03-10 MED ORDER — IPRATROPIUM-ALBUTEROL 0.5-2.5 (3) MG/3ML IN SOLN
3.0000 mL | Freq: Two times a day (BID) | RESPIRATORY_TRACT | Status: DC
  Filled 2023-03-10: qty 3

## 2023-03-10 MED ORDER — SODIUM CHLORIDE 0.9 % IV SOLN
1.0000 g | INTRAVENOUS | Status: AC
  Administered 2023-03-10 – 2023-03-12 (×3): 1 g via INTRAVENOUS
  Filled 2023-03-10 (×3): qty 10

## 2023-03-10 MED ORDER — METOCLOPRAMIDE HCL 5 MG/ML IJ SOLN
10.0000 mg | Freq: Four times a day (QID) | INTRAMUSCULAR | Status: AC
  Administered 2023-03-10 – 2023-03-11 (×4): 10 mg via INTRAVENOUS
  Filled 2023-03-10 (×4): qty 2

## 2023-03-10 MED ORDER — WARFARIN - PHARMACIST DOSING INPATIENT
Freq: Every day | Status: DC
  Administered 2023-03-11: 1

## 2023-03-10 MED ORDER — WARFARIN SODIUM 2.5 MG PO TABS
2.5000 mg | ORAL_TABLET | Freq: Once | ORAL | Status: AC
  Administered 2023-03-10: 2.5 mg via ORAL
  Filled 2023-03-10: qty 1

## 2023-03-10 MED ORDER — POTASSIUM CHLORIDE CRYS ER 20 MEQ PO TBCR
20.0000 meq | EXTENDED_RELEASE_TABLET | ORAL | Status: AC
Start: 2023-03-10 — End: 2023-03-10
  Administered 2023-03-10 (×3): 20 meq via ORAL
  Filled 2023-03-10 (×3): qty 1

## 2023-03-10 NOTE — Progress Notes (Signed)
   NAME:  Penny Mccullough, MRN:  [id number], DOB:  08-09-1945, LOS: 4 ADMISSION DATE:  03/06/2023, CONSULTATION DATE: 03/06/2023 REFERRING MD: Eugenio Hoes, CHIEF COMPLAINT: Status post mitral valve replacement/tricuspid valve repair  History of Present Illness:  78 year old female with COPD, ex-smoker coronary artery disease status post CABG and MI status post LAD stent, paroxysmal A-fib on Eliquis and amiodarone and rheumatic mitral valve disease who underwent workup for exertional dyspnea, noted to have severe mitral regurgitation and tricuspid regurgitation, today she underwent mitral valve replacement and tricuspid valve repair.  She was transferred to ICU postop, PCCM was consulted for help evaluation medical management  Pertinent  Medical History   Past Medical History:  Diagnosis Date   CAD (coronary artery disease)    CHF (congestive heart failure) (HCC)    COPD (chronic obstructive pulmonary disease) (HCC)    CVA (cerebral vascular accident) (HCC)    Dysrhythmia    A.Fib   Heart murmur    History of kidney stones    HLD (hyperlipidemia)    Hypertension    MI (myocardial infarction) (HCC) 10/2020     Significant Hospital Events: Including procedures, antibiotic start and stop dates in addition to other pertinent events     Interim History / Subjective:  Diuresed okay. Walked halls yesterday.  Objective   Blood pressure 113/80, pulse 95, temperature 97.8 F (36.6 C), temperature source Oral, resp. rate (!) 23, height 5\' 1"  (1.549 m), weight 64.7 kg, SpO2 91%.        Intake/Output Summary (Last 24 hours) at 03/10/2023 0701 Last data filed at 03/10/2023 0536 Gross per 24 hour  Intake 360 ml  Output 1080 ml  Net -720 ml   Filed Weights   03/08/23 0500 03/09/23 0530 03/10/23 0500  Weight: 66 kg 65.2 kg 64.7 kg    Examination: Resting comfortably Ext minimal edema Paced rhythm monitor Moves to command Hard of hearing  BMP stable Afebrile CXR still with  bilateral infiltrates edema vs. 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

## 2023-03-10 NOTE — Progress Notes (Signed)
PHARMACY - ANTICOAGULATION CONSULT NOTE  Pharmacy Consult for warfarin Indication: afib + bioprosthetic mitral valve  Allergies  Allergen Reactions   Codeine Nausea And Vomiting    Patient Measurements: Height: 5\' 1"  (154.9 cm) Weight: 64.7 kg (142 lb 10.2 oz) IBW/kg (Calculated) : 47.8  Vital Signs: Temp: 97.9 F (36.6 C) (01/19 0800) Temp Source: Axillary (01/19 0800) BP: 93/63 (01/19 0700) Pulse Rate: 82 (01/19 0700)  Labs: Recent Labs    03/08/23 0411 03/09/23 0311 03/10/23 0321 03/10/23 0945  HGB 9.2* 9.3*  --  9.5*  HCT 27.8* 28.2*  --  28.7*  PLT 129* 131*  --  194  LABPROT  --   --  14.3  --   INR  --   --  1.1  --   CREATININE 1.21* 1.26* 1.20*  --     Estimated Creatinine Clearance: 34.4 mL/min (A) (by C-G formula based on SCr of 1.2 mg/dL (H)).   Medical History: Past Medical History:  Diagnosis Date   CAD (coronary artery disease)    CHF (congestive heart failure) (HCC)    COPD (chronic obstructive pulmonary disease) (HCC)    CVA (cerebral vascular accident) (HCC)    Dysrhythmia    A.Fib   Heart murmur    History of kidney stones    HLD (hyperlipidemia)    Hypertension    MI (myocardial infarction) (HCC) 10/2020    Assessment: 78 yo female previously on apixaban for afib, now pharmacy asked to switch to Coumadin for bioprosthetic mitral valve + afib.    Goal of Therapy:  INR 2-3 Monitor platelets by anticoagulation protocol: Yes   Plan:  Coumadin 2.5 mg po x 1 tonight Daily INR. Will need education prior to discharge.  Reece Leader, Colon Flattery, BCCP Clinical Pharmacist  03/10/2023 10:26 AM   [facility] pharmacy phone numbers are listed on [email or web address]

## 2023-03-10 NOTE — Progress Notes (Signed)
4 Days Post-Op Procedure(s) (LRB): REDO STERNOTOMY (N/A) MITRAL VALVE (MV) REPLACEMENT, USING 29 MM MEDTRONIC MOSAIC PORCINE HEART VALVE (N/A) TRANSESOPHAGEAL ECHOCARDIOGRAM (N/A) TRICUSPID VALVE REPAIR, USING 30 MM MC3 ANNULOPLASTY RING Subjective: Feels better, nausea improved Short of breath with minimal activity  Objective: Vital signs in last 24 hours: Temp:  [97.7 F (36.5 C)-97.9 F (36.6 C)] 97.9 F (36.6 C) (01/19 0800) Pulse Rate:  [79-95] 82 (01/19 0700) Cardiac Rhythm: Atrial fibrillation (01/19 0620) Resp:  [9-29] 11 (01/19 0700) BP: (90-126)/(60-80) 93/63 (01/19 0700) SpO2:  [87 %-98 %] 93 % (01/19 0700) Weight:  [64.7 kg] 64.7 kg (01/19 0500)  Hemodynamic parameters for last 24 hours:    Intake/Output from previous day: 01/18 0701 - 01/19 0700 In: 360 [P.O.:360] Out: 1080 [Urine:1080] Intake/Output this shift: No intake/output data recorded.  General appearance: alert, cooperative, and no distress Neurologic: intact Heart: irregularly irregular rhythm Lungs: diminished breath sounds bibasilar Abdomen: less distended, nontender  Lab Results: Recent Labs    03/08/23 0411 03/09/23 0311  WBC 19.7* 14.8*  HGB 9.2* 9.3*  HCT 27.8* 28.2*  PLT 129* 131*   BMET:  Recent Labs    03/09/23 0311 03/10/23 0321  NA 133* 134*  K 4.1 3.4*  CL 100 97*  CO2 24 25  GLUCOSE 124* 131*  BUN 27* 31*  CREATININE 1.26* 1.20*  CALCIUM 8.4* 8.1*    PT/INR:  Recent Labs    03/10/23 0321  LABPROT 14.3  INR 1.1   ABG    Component Value Date/Time   PHART 7.373 03/07/2023 0506   HCO3 22.2 03/07/2023 0506   TCO2 23 03/07/2023 0506   ACIDBASEDEF 3.0 (H) 03/07/2023 0506   O2SAT 57.8 03/09/2023 0311   CBG (last 3)  Recent Labs    03/09/23 0804 03/09/23 1140 03/09/23 1616  GLUCAP 100* 127* 100*    Assessment/Plan: S/P Procedure(s) (LRB): REDO STERNOTOMY (N/A) MITRAL VALVE (MV) REPLACEMENT, USING 29 MM MEDTRONIC MOSAIC PORCINE HEART VALVE  (N/A) TRANSESOPHAGEAL ECHOCARDIOGRAM (N/A) TRICUSPID VALVE REPAIR, USING 30 MM MC3 ANNULOPLASTY RING POD # 5 NEURO- intact CV- in atrial fib with controlled VR, intermittent paced beat  Co-ox pending off milrinone since central line DC  Pharmacy consult for warfarin RESP- still on 10L HFNC  Continue IS  CXR interstitial edema- continue diuresis RENAL_ creatinine normal  K-= 3.4- supplement ENDO- CBG well controlled Gi- nausea improved  Diet as tolerated  Will continue Reglan one more day ID- ceftriaxone for UTI day 3/5 Deconditioning- mobilize  LOS: 4 days    Loreli Slot 03/10/2023

## 2023-03-10 NOTE — Progress Notes (Signed)
      [address]       [place] [address]             [phone]    Resting comfortably  BP 106/64   Pulse 93   Temp 97.6 F (36.4 C) (Axillary)   Resp 17   Ht 5\' 1"  (1.549 m)   Wt 64.7 kg   SpO2 94%   BMI 26.95 kg/m  Rate controlled A fib  Intake/Output Summary (Last 24 hours) at 03/10/2023 1734 Last data filed at 03/10/2023 1400 Gross per 24 hour  Intake 320 ml  Output 1030 ml  Net -710 ml   WBC 12.5 K trending down  Warfarin 2.5 mg tonight  Jetaime Pinnix C. Dorris Fetch, MD [facility] [phone]

## 2023-03-11 DIAGNOSIS — J9601 Acute respiratory failure with hypoxia: Secondary | ICD-10-CM

## 2023-03-11 DIAGNOSIS — J449 Chronic obstructive pulmonary disease, unspecified: Secondary | ICD-10-CM | POA: Diagnosis not present

## 2023-03-11 DIAGNOSIS — J810 Acute pulmonary edema: Secondary | ICD-10-CM

## 2023-03-11 DIAGNOSIS — I480 Paroxysmal atrial fibrillation: Secondary | ICD-10-CM

## 2023-03-11 DIAGNOSIS — I10 Essential (primary) hypertension: Secondary | ICD-10-CM

## 2023-03-11 LAB — CBC
HCT: 29.7 % — ABNORMAL LOW (ref 36.0–46.0)
Hemoglobin: 9.8 g/dL — ABNORMAL LOW (ref 12.0–15.0)
MCH: 30.4 pg (ref 26.0–34.0)
MCHC: 33 g/dL (ref 30.0–36.0)
MCV: 92.2 fL (ref 80.0–100.0)
Platelets: 206 10*3/uL (ref 150–400)
RBC: 3.22 MIL/uL — ABNORMAL LOW (ref 3.87–5.11)
RDW: 14 % (ref 11.5–15.5)
WBC: 9.2 10*3/uL (ref 4.0–10.5)
nRBC: 0 % (ref 0.0–0.2)

## 2023-03-11 LAB — BASIC METABOLIC PANEL
Anion gap: 9 (ref 5–15)
BUN: 30 mg/dL — ABNORMAL HIGH (ref 8–23)
CO2: 27 mmol/L (ref 22–32)
Calcium: 8.3 mg/dL — ABNORMAL LOW (ref 8.9–10.3)
Chloride: 98 mmol/L (ref 98–111)
Creatinine, Ser: 1.19 mg/dL — ABNORMAL HIGH (ref 0.44–1.00)
GFR, Estimated: 47 mL/min — ABNORMAL LOW (ref >60)
Glucose, Bld: 151 mg/dL — ABNORMAL HIGH (ref 70–99)
Potassium: 4.1 mmol/L (ref 3.5–5.1)
Sodium: 134 mmol/L — ABNORMAL LOW (ref 135–145)

## 2023-03-11 LAB — GLUCOSE, CAPILLARY
Glucose-Capillary: 137 mg/dL — ABNORMAL HIGH (ref 70–99)
Glucose-Capillary: 123 mg/dL — ABNORMAL HIGH (ref 70–99)
Glucose-Capillary: 101 mg/dL — ABNORMAL HIGH (ref 70–99)
Glucose-Capillary: 125 mg/dL — ABNORMAL HIGH (ref 70–99)

## 2023-03-11 LAB — PROTIME-INR
INR: 1.1 (ref 0.8–1.2)
Prothrombin Time: 14.5 s (ref 11.4–15.2)

## 2023-03-11 LAB — MAGNESIUM: Magnesium: 2.1 mg/dL (ref 1.7–2.4)

## 2023-03-11 MED ORDER — WARFARIN SODIUM 2.5 MG PO TABS
2.5000 mg | ORAL_TABLET | Freq: Once | ORAL | Status: AC
Start: 2023-03-11 — End: 2023-03-11
  Administered 2023-03-11: 2.5 mg via ORAL
  Filled 2023-03-11: qty 1

## 2023-03-11 MED ORDER — METOCLOPRAMIDE HCL 5 MG/ML IJ SOLN
10.0000 mg | Freq: Three times a day (TID) | INTRAMUSCULAR | Status: AC
  Administered 2023-03-11 – 2023-03-12 (×4): 10 mg via INTRAVENOUS
  Filled 2023-03-11 (×4): qty 2

## 2023-03-11 MED ORDER — BUDESONIDE 0.5 MG/2ML IN SUSP
0.5000 mg | Freq: Two times a day (BID) | RESPIRATORY_TRACT | Status: DC
  Administered 2023-03-11 – 2023-03-22 (×22): 0.5 mg via RESPIRATORY_TRACT
  Filled 2023-03-11 (×23): qty 2

## 2023-03-11 MED ORDER — IPRATROPIUM-ALBUTEROL 0.5-2.5 (3) MG/3ML IN SOLN
3.0000 mL | Freq: Four times a day (QID) | RESPIRATORY_TRACT | Status: DC
  Administered 2023-03-11 (×3): 3 mL via RESPIRATORY_TRACT
  Filled 2023-03-11 (×3): qty 3

## 2023-03-11 MED ORDER — IPRATROPIUM-ALBUTEROL 0.5-2.5 (3) MG/3ML IN SOLN
3.0000 mL | Freq: Two times a day (BID) | RESPIRATORY_TRACT | Status: DC
  Administered 2023-03-12: 3 mL via RESPIRATORY_TRACT
  Filled 2023-03-11: qty 3

## 2023-03-11 NOTE — TOC Initial Note (Signed)
Transition of Care [facility]) - Initial/Assessment Note    Patient Details  Name: Penny Mccullough MRN: [id number] Date of Birth: 13-Aug-1945  Transition of [facility]) CM/SW Contact:    Gala Lewandowsky, RN Phone Number: 03/11/2023, 4:39 PM  Clinical Narrative:   Patient presented for rheumatic mitral regurgitation-POD-5 redo sternotomy MVR. PTA patient was from home with the support of her granddaughter. Patient has been prearranged outpatient with [facility] for PT/OT. Office to follow up post hospital. Case Manager will continue to follow for additional transition of care needs.              Expected Discharge Plan: Home w Home Health Services Barriers to Discharge: Continued Medical Work up   Expected Discharge Plan and Services   Discharge Planning Services: CM Consult Post Acute Care Choice: Home Health Living arrangements for the past 2 months: Single Family Home     HH Arranged: PT, OT HH Agency: [facility] ([facility])  Prior Living Arrangements/Services Living arrangements for the past 2 months: Single Family Home Lives with:: Relatives Patient language and need for interpreter reviewed:: Yes Do you feel safe going back to the place where you live?: Yes      Need for Family Participation in Patient Care: Yes (Comment) Care giver support system in place?: Yes (comment)   Criminal Activity/Legal Involvement Pertinent to Current Situation/Hospitalization: No - Comment as needed   Permission Sought/Granted Permission sought to share information with : Family Supports, Case Manager     Emotional Assessment Appearance:: Appears stated age     Alcohol / Substance Use: Not Applicable Psych Involvement: No (comment)  Admission diagnosis:  S/P MVR (mitral valve replacement) [Z95.2] Patient Active Problem List   Diagnosis Date Noted   Acute pulmonary edema (HCC) 03/11/2023   S/P MVR (mitral valve replacement) 03/06/2023   Mitral regurgitation 08/06/2022    CAD (coronary artery disease) 01/09/2021   COPD (chronic obstructive pulmonary disease) (HCC) 01/09/2021   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   NSTEMI (non-ST elevated myocardial infarction) (HCC) 10/27/2020   Acute lower UTI 10/27/2020   Lacunar infarction (HCC) 10/27/2020   Hyperglycemia 10/27/2020   Breast lesion 10/27/2020   Pulmonary nodule 10/27/2020   PCP:  Patient, No Pcp Per Pharmacy:   [facility] [address], [place] - [address] [place] [address] Phone: [phone] Fax: [phone]  Social Drivers of Health (SDOH) Social History: SDOH Screenings   Food Insecurity: No Food Insecurity (03/07/2023)  Housing: Low Risk  (03/07/2023)  Transportation Needs: No Transportation Needs (03/07/2023)  Utilities: Not At Risk (03/07/2023)  Depression (PHQ2-9): Low Risk  (02/16/2021)  Social Connections: Unknown (03/07/2023)  Tobacco Use: Medium Risk (03/06/2023)    Readmission Risk Interventions     No data to display

## 2023-03-11 NOTE — Evaluation (Signed)
Physical Therapy Evaluation Patient Details Name: Penny Mccullough MRN: [id number] DOB: [birth year] Today's Date: 03/11/2023  History of Present Illness  78 y.o. female admitted 03/06/23 for planned MVR, tricuspid valve repair, redo sternotomy. PMH includes CAD s/p CABG (1989), afib on Eliquis, CHF, COPD, CVA, HTN, MI.   Clinical Impression  Pt admitted with above. PTA pt was indep without AD, didn't require supplemental O2, driving, and living with granddaughter. Pt now presenting with poor activity tolerance, generalized weakness, is on 11 LO2 via [place], requires use of RW for safe ambulation, and pt with noted DOE of 3/4 with ambulation of 120' with eva walker. Anticipate as patient medically improves pt will functionally improve and be able to transition home with family support, rollator, and [facility] services. Recommend OT to address ADLs under sternal precautions. Acute PT to cont to follow.        If plan is discharge home, recommend the following: A little help with walking and/or transfers;A little help with bathing/dressing/bathroom;Assistance with cooking/housework;Assist for transportation;Help with stairs or ramp for entrance   Can travel by private vehicle        Equipment Recommendations Rollator (4 wheels)  Recommendations for Other Services  OT consult    Functional Status Assessment Patient has had a recent decline in their functional status and demonstrates the ability to make significant improvements in function in a reasonable and predictable amount of time.     Precautions / Restrictions Precautions Precautions: Fall;Sternal Precaution Booklet Issued: No Precaution Comments: pt educated, pt with good verbal understanding but requires cues to adhere functionally Restrictions Weight Bearing Restrictions Per Provider Order: Yes RUE Weight Bearing Per Provider Order: Non weight bearing LUE Weight Bearing Per Provider Order: Non weight bearing      Mobility  Bed  Mobility Overal bed mobility: Needs Assistance Bed Mobility: Sit to Supine       Sit to supine: Mod assist   General bed mobility comments: held onto pillow, modA for LE management back into bed, pt received sitting in chair    Transfers Overall transfer level: Needs assistance Equipment used: 1 person hand held assist Transfers: Sit to/from Stand, Bed to chair/wheelchair/BSC Sit to Stand: Mod assist   Step pivot transfers: Mod assist       General transfer comment: pt requiring 3 trials to stand up the first time, then minA for the following 3 STS, pt initially unsteady but did better later in session    Ambulation/Gait Ambulation/Gait assistance: Min assist, +2 safety/equipment Gait Distance (Feet): 160 Feet Assistive device: Fara Boros Gait Pattern/deviations: Step-through pattern, Decreased stride length Gait velocity: impulsively fast, requires verbal cues to slow down Gait velocity interpretation: 1.31 - 2.62 ft/sec, indicative of limited community ambulator   General Gait Details: minA to aide in walker management and verbal cues to slow down cadence, pt with HR up to 128bpm, noted SOB/DOE, 1 standing rest break  Stairs            Wheelchair Mobility     Tilt Bed    Modified Rankin (Stroke Patients Only)       Balance Overall balance assessment: Mild deficits observed, not formally tested                                           Pertinent Vitals/Pain Pain Assessment Pain Assessment: Faces Faces Pain Scale: Hurts little more    Home  Living Family/patient expects to be discharged to:: Private residence Living Arrangements: Other relatives (granddaughter) Available Help at Discharge: Family;Available PRN/intermittently (grddtr goes to school however pt reports her cousin can help during the day if needed) Type of Home: House Home Access: Stairs to enter Entrance Stairs-Rails: Right;Left;Can reach both Entrance Stairs-Number of  Steps: 3   Home Layout: One level Home Equipment: Agricultural consultant (2 wheels);BSC/3in1      Prior Function Prior Level of Function : Driving;Independent/Modified Independent             Mobility Comments: did grocery shopping, no AD use ADLs Comments: indep     Extremity/Trunk Assessment   Upper Extremity Assessment Upper Extremity Assessment: Generalized weakness    Lower Extremity Assessment Lower Extremity Assessment: Generalized weakness    Cervical / Trunk Assessment Cervical / Trunk Assessment: Other exceptions Cervical / Trunk Exceptions: sternal incision  Communication   Communication Communication: Hearing impairment  Cognition Arousal: Alert Behavior During Therapy: WFL for tasks assessed/performed Overall Cognitive Status: Within Functional Limits for tasks assessed                                 General Comments: pt HOH delaying processing but once heard pt accurately and appropriately following commands        General Comments General comments (skin integrity, edema, etc.): sternal incision without drainage, HR up to 128bpm with amb, SpO2 >90% on 11LO2 via [place]    Exercises     Assessment/Plan    PT Assessment Patient needs continued PT services  PT Problem List Decreased strength;Decreased activity tolerance;Decreased mobility;Decreased balance;Decreased safety awareness       PT Treatment Interventions DME instruction;Stair training;Gait training;Functional mobility training;Therapeutic activities;Therapeutic exercise;Balance training;Neuromuscular re-education    PT Goals (Current goals can be found in the Care Plan section)  Acute Rehab PT Goals Patient Stated Goal: home PT Goal Formulation: With patient Time For Goal Achievement: 03/25/23 Potential to Achieve Goals: Good    Frequency Min 3X/week     Co-evaluation               AM-PAC PT "6 Clicks" Mobility  Outcome Measure Help needed turning from your back to  your side while in a flat bed without using bedrails?: A Little Help needed moving from lying on your back to sitting on the side of a flat bed without using bedrails?: A Little Help needed moving to and from a bed to a chair (including a wheelchair)?: A Lot Help needed standing up from a chair using your arms (e.g., wheelchair or bedside chair)?: A Lot Help needed to walk in hospital room?: A Lot Help needed climbing 3-5 steps with a railing? : A Lot 6 Click Score: 14    End of Session Equipment Utilized During Treatment: Oxygen (11 LO2) Activity Tolerance: Patient limited by fatigue Patient left: in bed;with call bell/phone within reach;with nursing/sitter in room Nurse Communication: Mobility status (RN present to assist PT) PT Visit Diagnosis: Unsteadiness on feet (R26.81);Muscle weakness (generalized) (M62.81)    Time: 1610-9604 PT Time Calculation (min) (ACUTE ONLY): 30 min   Charges:   PT Evaluation $PT Eval Moderate Complexity: 1 Mod PT Treatments $Gait Training: 8-22 mins PT General Charges $$ ACUTE PT VISIT: 1 Visit         Lewis Shock, PT, DPT Acute Rehabilitation Services Secure chat preferred Office #: [phone]   Iona Hansen 03/11/2023, 12:47 PM

## 2023-03-11 NOTE — Progress Notes (Signed)
      [address]       [name] [address]             [phone]      5 Days Post-Op Procedure(s) (LRB): REDO STERNOTOMY (N/A) MITRAL VALVE (MV) REPLACEMENT, USING 29 MM MEDTRONIC MOSAIC PORCINE HEART VALVE (N/A) TRANSESOPHAGEAL ECHOCARDIOGRAM (N/A) TRICUSPID VALVE REPAIR, USING 30 MM MC3 ANNULOPLASTY RING Subjective: The patient states she gets short of breath when she walks.   Objective: Vital signs in last 24 hours: Temp:  [97.6 F (36.4 C)-97.9 F (36.6 C)] 97.8 F (36.6 C) (01/19 2003) Pulse Rate:  [71-103] 81 (01/20 0700) Cardiac Rhythm: Atrial fibrillation (01/20 0400) Resp:  [11-26] 14 (01/20 0700) BP: (88-127)/(38-97) 111/89 (01/20 0700) SpO2:  [80 %-99 %] 94 % (01/20 0700) Weight:  [63.9 kg] 63.9 kg (01/20 0500)  Hemodynamic parameters for last 24 hours:    Intake/Output from previous day: 01/19 0701 - 01/20 0700 In: 320 [P.O.:220; IV Piggyback:100] Out: 1345 [Urine:1345] Intake/Output this shift: No intake/output data recorded.  General appearance: alert, cooperative, and no distress Neurologic: intact Heart: irregularly irregular rhythm Lungs: diminished breath sounds Abdomen: soft, non-tender; bowel sounds normal; no masses,  no organomegaly Extremities: edema 1+ Wound: Clean and dry without sign of infection, 2 staples in place at superior portion of wound  Lab Results: Recent Labs    03/09/23 0311 03/10/23 0945  WBC 14.8* 12.5*  HGB 9.3* 9.5*  HCT 28.2* 28.7*  PLT 131* 194   BMET:  Recent Labs    03/09/23 0311 03/10/23 0321  NA 133* 134*  K 4.1 3.4*  CL 100 97*  CO2 24 25  GLUCOSE 124* 131*  BUN 27* 31*  CREATININE 1.26* 1.20*  CALCIUM 8.4* 8.1*    PT/INR:  Recent Labs    03/10/23 0321  LABPROT 14.3  INR 1.1   ABG    Component Value Date/Time   PHART 7.373 03/07/2023 0506   HCO3 22.2 03/07/2023 0506   TCO2 23 03/07/2023 0506   ACIDBASEDEF 3.0 (H) 03/07/2023 0506   O2SAT 63.9 03/10/2023 1905   CBG  (last 3)  Recent Labs    03/10/23 1148 03/10/23 1610 03/10/23 2152  GLUCAP 154* 101* 113*    Assessment/Plan: S/P Procedure(s) (LRB): REDO STERNOTOMY (N/A) MITRAL VALVE (MV) REPLACEMENT, USING 29 MM MEDTRONIC MOSAIC PORCINE HEART VALVE (N/A) TRANSESOPHAGEAL ECHOCARDIOGRAM (N/A) TRICUSPID VALVE REPAIR, USING 30 MM MC3 ANNULOPLASTY RING  CV: Chronic HFrEF. SBP 110s-120s, some soft readings. Hx of PAF on Eliquis at home. Atrial fibrillation, HR 80-100s, reasonably controlled rate. D/C EPW.   Pulm: CXR yesterday with pulmonary edema and small pleural effusions consistent with congestive heart failure. Saturating on 12L HFNC. Continue nebs, IS and ambulation. Add flutter valve  GI: Small BM yesterday. Nausea improving  Endo: Preop A1C 6.1. On Jardiance at home. CBGs controlled on SSI AC/HS.   Renal: Last Cr 1.2. UO 1345cc/24hrs. +6lbs from preop. Lasix IV 40mg  BID today per Dr. Leafy Ro. Last K 3.4, supplement. No new bmet.   ID: Leukocytosis improving, WBC 12.5. Tmax 97.9. On Ceftriaxone for UTI.   Expected postop ABLA: H/H 9.5/28.7. Reactive thrombocytopenia resolved.    DVT Prophylaxis: Lovenox  INR: Coumadin per pharmacy. Coumadin 2.5mg  yesterday. No INR this AM.   Dispo: Continue ICU care   LOS: 5 days    Jenny Reichmann, PA-C 03/11/2023

## 2023-03-11 NOTE — Progress Notes (Signed)
   NAME:  Penny Mccullough, MRN:  [id number], DOB:  [birth year], LOS: 5 ADMISSION DATE:  03/06/2023, CONSULTATION DATE: 03/06/2023 REFERRING MD: Eugenio Hoes, CHIEF COMPLAINT: Status post mitral valve replacement/tricuspid valve repair  History of Present Illness:  78 year old female with COPD, ex-smoker coronary artery disease status post CABG and MI status post LAD stent, paroxysmal A-fib on Eliquis and amiodarone and rheumatic mitral valve disease who underwent workup for exertional dyspnea, noted to have severe mitral regurgitation and tricuspid regurgitation, today she underwent mitral valve replacement and tricuspid valve repair.  She was transferred to ICU postop, PCCM was consulted for help evaluation medical management  Pertinent  Medical History   Past Medical History:  Diagnosis Date   CAD (coronary artery disease)    CHF (congestive heart failure) (HCC)    COPD (chronic obstructive pulmonary disease) (HCC)    CVA (cerebral vascular accident) (HCC)    Dysrhythmia    A.Fib   Heart murmur    History of kidney stones    HLD (hyperlipidemia)    Hypertension    MI (myocardial infarction) (HCC) 10/2020     Significant Hospital Events: Including procedures, antibiotic start and stop dates in addition to other pertinent events     Interim History / Subjective:  Breathing comfortably at rest, but does c/o dyspnea on exertion.  Cough is minimally productive for clear sputum  Objective   Blood pressure 111/89, pulse 81, temperature 97.8 F (36.6 C), temperature source Oral, resp. rate 14, height 5\' 1"  (1.549 m), weight 63.9 kg, SpO2 94%.        Intake/Output Summary (Last 24 hours) at 03/11/2023 0729 Last data filed at 03/11/2023 0500 Gross per 24 hour  Intake 320 ml  Output 1345 ml  Net -1025 ml   Filed Weights   03/09/23 0530 03/10/23 0500 03/11/23 0500  Weight: 65.2 kg 64.7 kg 63.9 kg    Examination: General:  elderly appearing female in NAD Neuro:  Alert, oriented,  non-focal HEENT:  [place]/AT, No JVD noted, PERRL Cardiovascular:  RRR, no MRG. No peripheral edema.  Lungs:  Coarse wheeze. HFNC. Abdomen:  Soft, non-distended, normoactive Musculoskeletal:  No acute deformity or ROM limitation Skin:  Intact, MMM   Resolved Hospital Problem list     Assessment & Plan:   Acute respiratory failure with hypoxia secondary to pulmonary edema COPD +/- acute exacerbation CXR with L>R interstitial markings. PCT 0.7.  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 Please call Elink 7p-7a. [phone]  03/11/2023 7:38 AM

## 2023-03-11 NOTE — Progress Notes (Signed)
PHARMACY - ANTICOAGULATION CONSULT NOTE  Pharmacy Consult for warfarin Indication: afib + bioprosthetic mitral valve  Allergies  Allergen Reactions   Codeine Nausea And Vomiting    Patient Measurements: Height: 5\' 1"  (154.9 cm) Weight: 63.9 kg (140 lb 14 oz) IBW/kg (Calculated) : 47.8  Vital Signs: Temp: 97.6 F (36.4 C) (01/20 0753) Temp Source: Axillary (01/20 0753) BP: 129/69 (01/20 0915) Pulse Rate: 97 (01/20 0915)  Labs: Recent Labs    03/09/23 0311 03/10/23 0321 03/10/23 0945 03/11/23 0740  HGB 9.3*  --  9.5* 9.8*  HCT 28.2*  --  28.7* 29.7*  PLT 131*  --  194 206  LABPROT  --  14.3  --  14.5  INR  --  1.1  --  1.1  CREATININE 1.26* 1.20*  --  1.19*    Estimated Creatinine Clearance: 34.4 mL/min (A) (by C-G formula based on SCr of 1.19 mg/dL (H)).   Medical History: Past Medical History:  Diagnosis Date   CAD (coronary artery disease)    CHF (congestive heart failure) (HCC)    COPD (chronic obstructive pulmonary disease) (HCC)    CVA (cerebral vascular accident) (HCC)    Dysrhythmia    A.Fib   Heart murmur    History of kidney stones    HLD (hyperlipidemia)    Hypertension    MI (myocardial infarction) (HCC) 10/2020    Assessment: 78 yo female previously on apixaban for afib, pharmacy asked to switch to Coumadin for bioprosthetic mitral valve + afib starting 03/09/22.  INR is subtherapeutic at 1.1 (+/-0)  after warfarin 2.5 mg x1. CBC is stable with no signs of bleeding.    Goal of Therapy:  INR 2-3 Monitor platelets by anticoagulation protocol: Yes   Plan:  Coumadin 2.5 mg po x 1 tonight Monitor daily INR, CBC, and signs of bleeding  Wilmer Floor, PharmD PGY2 Cardiology Pharmacy Resident  03/11/2023 9:52 AM   [facility] pharmacy phone numbers are listed on [email or web address]

## 2023-03-12 ENCOUNTER — Inpatient Hospital Stay (HOSPITAL_COMMUNITY): Payer: 59

## 2023-03-12 DIAGNOSIS — I4891 Unspecified atrial fibrillation: Secondary | ICD-10-CM

## 2023-03-12 DIAGNOSIS — Z952 Presence of prosthetic heart valve: Secondary | ICD-10-CM | POA: Diagnosis not present

## 2023-03-12 DIAGNOSIS — I480 Paroxysmal atrial fibrillation: Secondary | ICD-10-CM | POA: Diagnosis not present

## 2023-03-12 DIAGNOSIS — I340 Nonrheumatic mitral (valve) insufficiency: Secondary | ICD-10-CM

## 2023-03-12 LAB — ECHOCARDIOGRAM COMPLETE
Calc EF: 25.4 %
Height: 61 in
MV VTI: 1.31 cm2
S' Lateral: 3.5 cm
Single Plane A2C EF: 26 %
Single Plane A4C EF: 25 %
Weight: 2253.98 [oz_av]

## 2023-03-12 LAB — BASIC METABOLIC PANEL
Anion gap: 12 (ref 5–15)
BUN: 26 mg/dL — ABNORMAL HIGH (ref 8–23)
CO2: 25 mmol/L (ref 22–32)
Calcium: 8.1 mg/dL — ABNORMAL LOW (ref 8.9–10.3)
Chloride: 98 mmol/L (ref 98–111)
Creatinine, Ser: 1.21 mg/dL — ABNORMAL HIGH (ref 0.44–1.00)
GFR, Estimated: 46 mL/min — ABNORMAL LOW (ref >60)
Glucose, Bld: 108 mg/dL — ABNORMAL HIGH (ref 70–99)
Potassium: 3.8 mmol/L (ref 3.5–5.1)
Sodium: 135 mmol/L (ref 135–145)

## 2023-03-12 LAB — GLUCOSE, CAPILLARY
Glucose-Capillary: 140 mg/dL — ABNORMAL HIGH (ref 70–99)
Glucose-Capillary: 109 mg/dL — ABNORMAL HIGH (ref 70–99)
Glucose-Capillary: 105 mg/dL — ABNORMAL HIGH (ref 70–99)
Glucose-Capillary: 116 mg/dL — ABNORMAL HIGH (ref 70–99)

## 2023-03-12 LAB — CBC
HCT: 28.6 % — ABNORMAL LOW (ref 36.0–46.0)
Hemoglobin: 9.6 g/dL — ABNORMAL LOW (ref 12.0–15.0)
MCH: 30.8 pg (ref 26.0–34.0)
MCHC: 33.6 g/dL (ref 30.0–36.0)
MCV: 91.7 fL (ref 80.0–100.0)
Platelets: 221 10*3/uL (ref 150–400)
RBC: 3.12 MIL/uL — ABNORMAL LOW (ref 3.87–5.11)
RDW: 14 % (ref 11.5–15.5)
WBC: 8 10*3/uL (ref 4.0–10.5)
nRBC: 0.4 % — ABNORMAL HIGH (ref 0.0–0.2)

## 2023-03-12 LAB — MAGNESIUM: Magnesium: 2.2 mg/dL (ref 1.7–2.4)

## 2023-03-12 LAB — PROTIME-INR
INR: 1.4 — ABNORMAL HIGH (ref 0.8–1.2)
Prothrombin Time: 17.3 s — ABNORMAL HIGH (ref 11.4–15.2)

## 2023-03-12 MED ORDER — WARFARIN SODIUM 1 MG PO TABS
1.5000 mg | ORAL_TABLET | Freq: Once | ORAL | Status: AC
  Administered 2023-03-12: 1.5 mg via ORAL
  Filled 2023-03-12: qty 1

## 2023-03-12 MED ORDER — ONDANSETRON HCL 4 MG PO TABS: 4.0000 mg | ORAL_TABLET | Freq: Three times a day (TID) | ORAL | Status: DC | PRN

## 2023-03-12 MED ORDER — AMIODARONE HCL 200 MG PO TABS
400.0000 mg | ORAL_TABLET | Freq: Two times a day (BID) | ORAL | Status: DC
  Administered 2023-03-12 – 2023-03-13 (×4): 400 mg via ORAL
  Filled 2023-03-12 (×4): qty 2

## 2023-03-12 MED ORDER — FUROSEMIDE 10 MG/ML IJ SOLN
80.0000 mg | Freq: Two times a day (BID) | INTRAMUSCULAR | Status: DC
  Administered 2023-03-12 – 2023-03-17 (×10): 80 mg via INTRAVENOUS
  Filled 2023-03-12 (×10): qty 8

## 2023-03-12 MED ORDER — REVEFENACIN 175 MCG/3ML IN SOLN
175.0000 ug | Freq: Every day | RESPIRATORY_TRACT | Status: DC
  Administered 2023-03-13 – 2023-03-22 (×10): 175 ug via RESPIRATORY_TRACT
  Filled 2023-03-12 (×10): qty 3

## 2023-03-12 MED ORDER — ARFORMOTEROL TARTRATE 15 MCG/2ML IN NEBU
15.0000 ug | INHALATION_SOLUTION | Freq: Two times a day (BID) | RESPIRATORY_TRACT | Status: DC
  Administered 2023-03-12 – 2023-03-22 (×19): 15 ug via RESPIRATORY_TRACT
  Filled 2023-03-12 (×20): qty 2

## 2023-03-12 MED ORDER — POTASSIUM CHLORIDE CRYS ER 20 MEQ PO TBCR
40.0000 meq | EXTENDED_RELEASE_TABLET | Freq: Once | ORAL | Status: AC
  Administered 2023-03-12: 40 meq via ORAL
  Filled 2023-03-12: qty 2

## 2023-03-12 MED ORDER — DIGOXIN 125 MCG PO TABS
0.2500 mg | ORAL_TABLET | Freq: Every day | ORAL | Status: DC
  Administered 2023-03-12 – 2023-03-13 (×2): 0.25 mg via ORAL
  Filled 2023-03-12 (×2): qty 2

## 2023-03-12 MED ORDER — ONDANSETRON HCL 4 MG/2ML IJ SOLN
4.0000 mg | Freq: Four times a day (QID) | INTRAMUSCULAR | Status: DC | PRN
  Administered 2023-03-12: 4 mg via INTRAVENOUS
  Filled 2023-03-12: qty 2

## 2023-03-12 MED ORDER — PERFLUTREN LIPID MICROSPHERE
1.0000 mL | INTRAVENOUS | Status: AC | PRN
  Administered 2023-03-12: 5 mL via INTRAVENOUS

## 2023-03-12 MED ORDER — ALBUTEROL SULFATE (2.5 MG/3ML) 0.083% IN NEBU
2.5000 mg | INHALATION_SOLUTION | RESPIRATORY_TRACT | Status: DC | PRN
  Administered 2023-03-12 – 2023-03-21 (×2): 2.5 mg via RESPIRATORY_TRACT
  Filled 2023-03-12 (×2): qty 3

## 2023-03-12 MED ORDER — POTASSIUM CHLORIDE CRYS ER 20 MEQ PO TBCR
20.0000 meq | EXTENDED_RELEASE_TABLET | ORAL | Status: AC
  Administered 2023-03-12 (×2): 20 meq via ORAL
  Filled 2023-03-12: qty 1

## 2023-03-12 MED ORDER — ARFORMOTEROL TARTRATE 15 MCG/2ML IN NEBU: 15.0000 ug | INHALATION_SOLUTION | Freq: Two times a day (BID) | RESPIRATORY_TRACT | Status: DC

## 2023-03-12 MED ORDER — POTASSIUM CHLORIDE CRYS ER 20 MEQ PO TBCR
20.0000 meq | EXTENDED_RELEASE_TABLET | ORAL | Status: DC
  Filled 2023-03-12: qty 1

## 2023-03-12 NOTE — Consult Note (Addendum)
Advanced Heart Failure Team Consult Note   Primary Physician: Patient, No Pcp Per Cardiologist:  None  Reason for Consultation:  A fib RVR/ Acute HFrEF   HPI:    Penny Mccullough is seen today for evaluation of A Fib RVR and Acute HFrEF at the request of Dr Katrinka Blazing.   Penny Mccullough is  a 78 year old with history hard of hearing, COPD, CABG, PAF, CAD,CABG, PCI ostial mid LAD 2022,  HTN, and  rheumatic mitral valve disease.   Diagnosed with A Fib December 2022. Underwent  cardioversion January 2023.   Followed by Dr Mariah Milling at [facility]. Last seen 01/2023. Sinus [place] with rates 40-50s and continued on Toprol XL 12.5 mg daily and Amio 200 mg daily.   Admitted with severe MR and underwent  MVR and TVR on 03/06/23. On admit she was in Sinus [place] with rate 42. IV amio but developed bradycardia. IV amio stopped.  Now off all pressors. Diuresing with IV lasix. On 1/16 she went in A fib. Started on digoxin this morning for A fib RVR. Oxygen requirements went up to 6 liters.  Echo showed LVEF down to 25% RV moderately down. Renal function stable. CO2 25. BP stable.   SOB with exertion earlier today.     Home Medications Prior to Admission medications   Medication Sig Start Date End Date Taking? Authorizing Provider  amiodarone (PACERONE) 200 MG tablet Take 1 tablet (200 mg total) by mouth daily. Take 1 tablet daily 07/03/22  Yes Gollan, Tollie Pizza, MD  apixaban (ELIQUIS) 5 MG TABS tablet Take 1 tablet (5 mg total) by mouth 2 (two) times daily. 07/03/22  Yes Antonieta Iba, MD  atorvastatin (LIPITOR) 80 MG tablet Take 1 tablet (80 mg total) by mouth daily. 07/03/22  Yes Gollan, Tollie Pizza, MD  empagliflozin (JARDIANCE) 10 MG TABS tablet TAKE 1 TABLET BY MOUTH EVERY DAY BEFORE BREAKFAST 07/03/22  Yes Gollan, Tollie Pizza, MD  ezetimibe (ZETIA) 10 MG tablet Take 1 tablet (10 mg total) by mouth daily. 07/03/22 01/15/25 Yes Gollan, Tollie Pizza, MD  furosemide (LASIX) 20 MG tablet Take 40 mg by mouth daily.   Yes  [provider]  metoprolol succinate (TOPROL-XL) 25 MG 24 hr tablet Take 0.5 tablet (12.5 mg) by mouth once daily 07/03/22  Yes Gollan, Tollie Pizza, MD  sacubitril-valsartan (ENTRESTO) 24-26 MG Take 1 tablet by mouth 2 (two) times daily. 07/19/22  Yes Laurey Morale, MD  spironolactone (ALDACTONE) 25 MG tablet Take 0.5 tablets (12.5 mg total) by mouth daily. 07/03/22  Yes Gollan, Tollie Pizza, MD  acetaminophen (TYLENOL) 500 MG tablet Take 500-1,000 mg by mouth every 6 (six) hours as needed for moderate pain (pain score 4-6) or headache.    [provider]  albuterol (VENTOLIN HFA) 108 (90 Base) MCG/ACT inhaler Inhale 2 puffs into the lungs every 6 (six) hours as needed for wheezing or shortness of breath. 01/09/21   Antonieta Iba, MD  furosemide (LASIX) 40 MG tablet Take 1 tablet (40 mg total) by mouth daily. Patient not taking: Reported on 03/01/2023 01/22/23   End, Cristal Deer, MD  nitroGLYCERIN (NITROSTAT) 0.4 MG SL tablet Place 1 tablet (0.4 mg total) under the tongue every 5 (five) minutes as needed for chest pain. 12/09/20   Furth, Cadence H, PA-C    Past Medical History: Past Medical History:  Diagnosis Date   CAD (coronary artery disease)    CHF (congestive heart failure) (HCC)    COPD (chronic obstructive  pulmonary disease) (HCC)    CVA (cerebral vascular accident) (HCC)    Dysrhythmia    A.Fib   Heart murmur    History of kidney stones    HLD (hyperlipidemia)    Hypertension    MI (myocardial infarction) (HCC) 10/2020    Past Surgical History: Past Surgical History:  Procedure Laterality Date   CARDIOVERSION N/A 03/16/2021   Procedure: CARDIOVERSION;  Surgeon: Antonieta Iba, MD;  Location: [facility] ORS;  Service: Cardiovascular;  Laterality: N/A;   CORONARY ARTERY BYPASS GRAFT  1989   CORONARY STENT INTERVENTION N/A 10/28/2020   Procedure: CORONARY STENT INTERVENTION;  Surgeon: Alwyn Pea, MD;  Location: [facility] INVASIVE CV LAB;  Service:  Cardiovascular;  Laterality: N/A;   LEFT HEART CATH AND CORONARY ANGIOGRAPHY N/A 10/28/2020   Procedure: LEFT HEART CATH AND CORONARY ANGIOGRAPHY;  Surgeon: Alwyn Pea, MD;  Location: [facility] INVASIVE CV LAB;  Service: Cardiovascular;  Laterality: N/A;   MITRAL VALVE REPLACEMENT N/A 03/06/2023   Procedure: MITRAL VALVE (MV) REPLACEMENT, USING 29 MM MEDTRONIC MOSAIC PORCINE HEART VALVE;  Surgeon: Eugenio Hoes, MD;  Location: [facility] OR;  Service: Open Heart Surgery;  Laterality: N/A;   RIGHT HEART CATH Right 01/22/2023   Procedure: RIGHT HEART CATH;  Surgeon: Yvonne Kendall, MD;  Location: [facility] INVASIVE CV LAB;  Service: Cardiovascular;  Laterality: Right;   RIGHT/LEFT HEART CATH AND CORONARY ANGIOGRAPHY N/A 04/18/2022   Procedure: RIGHT/LEFT HEART CATH AND CORONARY ANGIOGRAPHY;  Surgeon: Orbie Pyo, MD;  Location: [facility] INVASIVE CV LAB;  Service: Cardiovascular;  Laterality: N/A;   TEE WITHOUT CARDIOVERSION N/A 05/01/2022   Procedure: TRANSESOPHAGEAL ECHOCARDIOGRAM (TEE);  Surgeon: Wendall Stade, MD;  Location: [facility] ENDOSCOPY;  Service: Cardiovascular;  Laterality: N/A;   TEE WITHOUT CARDIOVERSION N/A 10/26/2022   Procedure: TRANSESOPHAGEAL ECHOCARDIOGRAM;  Surgeon: Wendall Stade, MD;  Location: [facility] INVASIVE CV LAB;  Service: Cardiovascular;  Laterality: N/A;   TEE WITHOUT CARDIOVERSION N/A 03/06/2023   Procedure: TRANSESOPHAGEAL ECHOCARDIOGRAM;  Surgeon: Eugenio Hoes, MD;  Location: [facility] OR;  Service: Open Heart Surgery;  Laterality: N/A;   TRICUSPID VALVE REPLACEMENT  03/06/2023   Procedure: TRICUSPID VALVE REPAIR, USING 30 MM MC3 ANNULOPLASTY RING;  Surgeon: Eugenio Hoes, MD;  Location: [facility] OR;  Service: Open Heart Surgery;;    Family History: Family History  Problem Relation Age of Onset   Hypertension Mother     Social History: Social History   Socioeconomic History   Marital status: Single    Spouse name: Not on file   Number of children: Not on file   Years of education: Not on  file   Highest education level: Not on file  Occupational History   Not on file  Tobacco Use   Smoking status: Former    Current packs/day: 0.00    Average packs/day: 1 pack/day for 30.0 years (30.0 ttl pk-yrs)    Types: Cigarettes    Start date: 02/20/1957    Quit date: 02/21/1987    Years since quitting: 36.0   Smokeless tobacco: Never  Vaping Use   Vaping status: Never Used  Substance and Sexual Activity   Alcohol use: Not Currently   Drug use: Never   Sexual activity: Not Currently  Other Topics Concern   Not on file  Social History Narrative   Not on file   Social Drivers of Health   Financial Resource Strain: Not on file  Food Insecurity: No Food Insecurity (03/07/2023)   Hunger Vital Sign    Worried About  Running Out of Food in the Last Year: Never true    Ran Out of Food in the Last Year: Never true  Transportation Needs: No Transportation Needs (03/07/2023)   PRAPARE - Administrator, Civil Service (Medical): No    Lack of Transportation (Non-Medical): No  Physical Activity: Not on file  Stress: Not on file  Social Connections: Unknown (03/07/2023)   Social Connection and Isolation Panel [NHANES]    Frequency of Communication with Friends and Family: More than three times a week    Frequency of Social Gatherings with Friends and Family: More than three times a week    Attends Religious Services: Patient declined    Database administrator or Organizations: Patient declined    Attends Banker Meetings: Patient declined    Marital Status: Patient declined    Allergies:  Allergies  Allergen Reactions   Codeine Nausea And Vomiting    Objective:    Vital Signs:   Temp:  [97.6 F (36.4 C)-98.2 F (36.8 C)] 98.1 F (36.7 C) (01/21 0830) Pulse Rate:  [73-126] 112 (01/21 1013) Resp:  [13-22] 17 (01/21 0800) BP: (72-127)/(52-87) 102/79 (01/21 0800) SpO2:  [75 %-98 %] 98 % (01/21 0810) FiO2 (%):  [40 %] 40 % (01/20 2022) Last BM Date :  03/12/23  Weight change: Filed Weights   03/09/23 0530 03/10/23 0500 03/11/23 0500  Weight: 65.2 kg 64.7 kg 63.9 kg    Intake/Output:   Intake/Output Summary (Last 24 hours) at 03/12/2023 1202 Last data filed at 03/12/2023 1000 Gross per 24 hour  Intake 220 ml  Output 1250 ml  Net -1030 ml      Physical Exam    General:  No resp difficulty. Hard of hearing  HEENT: normal Neck: supple. JVP 8-9 . Carotids 2+ bilat; no bruits. No lymphadenopathy or thyromegaly appreciated. Cor: PMI nondisplaced. Irregular rate & rhythm. No rubs, gallops or murmurs. Lungs: clear on 3 liters  Abdomen: soft, nontender, nondistended. No hepatosplenomegaly. No bruits or masses. Good bowel sounds. Extremities: no cyanosis, clubbing, rash, edema Neuro: alert & orientedx3, cranial nerves grossly intact. moves all 4 extremities w/o difficulty. Affect pleasant   Telemetry    Afib 90-110s   EKG    Last EKG 03/07/23 A fib 81 bpm   Labs   Basic Metabolic Panel: Recent Labs  Lab 03/08/23 0411 03/09/23 0311 03/10/23 0321 03/11/23 0740 03/12/23 0322  NA 134* 133* 134* 134* 135  K 4.0 4.1 3.4* 4.1 3.8  CL 103 100 97* 98 98  CO2 21* 24 25 27 25   GLUCOSE 144* 124* 131* 151* 108*  BUN 19 27* 31* 30* 26*  CREATININE 1.21* 1.26* 1.20* 1.19* 1.21*  CALCIUM 8.3* 8.4* 8.1* 8.3* 8.1*  MG 2.3 2.1 2.0 2.1 2.2    Liver Function Tests: No results for input(s): "AST", "ALT", "ALKPHOS", "BILITOT", "PROT", "ALBUMIN" in the last 168 hours. No results for input(s): "LIPASE", "AMYLASE" in the last 168 hours. No results for input(s): "AMMONIA" in the last 168 hours.  CBC: Recent Labs  Lab 03/08/23 0411 03/09/23 0311 03/10/23 0945 03/11/23 0740 03/12/23 0322  WBC 19.7* 14.8* 12.5* 9.2 8.0  HGB 9.2* 9.3* 9.5* 9.8* 9.6*  HCT 27.8* 28.2* 28.7* 29.7* 28.6*  MCV 94.6 93.4 93.5 92.2 91.7  PLT 129* 131* 194 206 221    Cardiac Enzymes: No results for input(s): "CKTOTAL", "CKMB", "CKMBINDEX", "TROPONINI"  in the last 168 hours.  BNP: BNP (last 3 results) Recent Labs  06/18/22 1458 07/19/22 1227 02/05/23 1130  BNP 363.7* 256.9* 519.0*    ProBNP (last 3 results) Recent Labs    08/15/22 1209  PROBNP 435     CBG: Recent Labs  Lab 03/11/23 1109 03/11/23 1606 03/11/23 2118 03/12/23 0637 03/12/23 1126  GLUCAP 123* 101* 125* 140* 109*    Coagulation Studies: Recent Labs    03/10/23 0321 03/11/23 0740 03/12/23 0322  LABPROT 14.3 14.5 17.3*  INR 1.1 1.1 1.4*     Imaging   DG CHEST PORT 1 VIEW Result Date: 03/12/2023 CLINICAL DATA:  Hypoxia. EXAM: PORTABLE CHEST 1 VIEW COMPARISON:  Chest radiograph dated 03/10/2023 FINDINGS: There is cardiomegaly with vascular congestion. Overall slightly improved since the prior radiograph. Small bilateral pleural effusions and bibasilar atelectasis. Pneumonia is not excluded. No pneumothorax. Median sternotomy wires. No acute osseous pathology IMPRESSION: 1. Cardiomegaly with vascular congestion, slightly improved. 2. Small bilateral pleural effusions and bibasilar atelectasis. Electronically Signed   By: Elgie Collard M.D.   On: 03/12/2023 11:23     Medications:     Current Medications:  arformoterol  15 mcg Nebulization BID   aspirin  81 mg Oral Daily   atorvastatin  80 mg Oral Daily   bisacodyl  10 mg Oral Daily   Or   bisacodyl  10 mg Rectal Daily   budesonide (PULMICORT) nebulizer solution  0.5 mg Nebulization BID   Chlorhexidine Gluconate Cloth  6 each Topical Daily   digoxin  0.25 mg Oral Daily   docusate sodium  200 mg Oral Daily   ezetimibe  10 mg Oral Daily   feeding supplement  237 mL Oral BID BM   furosemide  40 mg Intravenous Q12H   insulin aspart  0-15 Units Subcutaneous TID WC   metoCLOPramide (REGLAN) injection  10 mg Intravenous Q8H   mouth rinse  15 mL Mouth Rinse 4 times per day   pantoprazole  40 mg Oral Daily   polyethylene glycol  17 g Oral Daily   potassium chloride  20 mEq Oral Q4H    revefenacin  175 mcg Nebulization Daily   warfarin  1.5 mg Oral ONCE-1600   Warfarin - Pharmacist Dosing Inpatient   Does not apply q1600    Infusions:  albumin human     cefTRIAXone (ROCEPHIN)  IV 200 mL/hr at 03/11/23 1800      Patient Profile   Penny Mccullough is  a 78 year old with history COPD, CABG, PAF, CAD, HTN, and  rheumatic mitral valve disease.   Admitted with severe MR and underwent scheduled MVR and TVR. Complicated by A fib RVR and Acute HF.    Assessment/Plan   1. Severe MR , S/P MVReplacement (bio) TVRepair, LAA oversewing on 03/06/23 Intraoperative TEE EF 40%.  Off all pressors.  In A fib RVR. - Echo repeated today. EF now  down to 25-30%% with RV severely reduced.  - see below.   2. PAF Developed A fib 2022. Had cardioversion 2023.  -Back in A fib RVR today. Previously on IV amio but stopped due to bradycardia.  - Given digoxin earlier. Rate better controlled.  - Add amio 400 mg twice daily.  - On coumadin. INR 1.4  3. Acute Biventricular HFrEF - Echo repeated today. EF now  down to 25-30% with RV severely reduced.  - A fib RVR likely contributing.  - Volume status mildly elevated. Continue IV lasix twice a day.   4. Acute Hypoxic Respiratory Failure COPD. She does require oxygen at home.  Developed A fib RVR earlier today. Oxygen requirements increased to 6 liters.  Sats stable.   5. Anemia, Expected Blood Loss   Length of Stay: 6  Amy Clegg, NP  03/12/2023, 12:02 PM  Advanced Heart Failure Team Pager [phone] (M-F; 7a - 5p)  Please contact [facility] for night-coverage after hours (4p -7a ) and weekends on [email or web address]  Patient seen and examined with the above-signed Advanced Practice Provider and/or Housestaff. I personally reviewed laboratory data, imaging studies and relevant notes. I independently examined the patient and formulated the important aspects of the plan. I have edited the note to reflect any of my changes or salient points. I have  personally discussed the plan with the patient and/or family.  78 yo woman with PAF, COPD, CAD s/p previous CABG (LIMA) -> LIMA occluded. Patent LAD stents cath 2/24. 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   Underwent MV Replacement/TV Repair on 03/06/23. Intra-op TEE 40-45%  Post-op went back into AF. Over past few days has struggled with AF with RVR and soft BP.   Echo today EF 25-30% with severe RV dysfunction SevereLAE.  Currently feels fatigued. + DOE. AF rates 90-110  CXR + edema and bilateral effusions  General:  Fatigued appearing. No resp difficulty HEENT: normal Neck: supple. JVP 7-8. Carotids 2+ bilat; no bruits. No lymphadenopathy or thryomegaly appreciated. Cor: Sternal wound ok. Irregular rate & rhythm.  Lungs: decreased throughout Abdomen: soft, nontender, nondistended. No hepatosplenomegaly. No bruits or masses. Good bowel sounds. Extremities: no cyanosis, clubbing, rash, edema Neuro: alert & orientedx3, cranial nerves grossly intact. moves all 4 extremities w/o difficulty. Affect pleasant  She has post-op biventricular failure (R>L) complicated by recurrent AF with RVR. She also appears volume overloaded.   Will resume po amio 400 bid (no central access for IV amio). Increase lasix to 80 IV bid.   If not responding well will need central access and possible short-term inotropic support.   Eventual DC-CV (LAA oversewn at time of surgery)  Arvilla Meres, MD  4:14 PM

## 2023-03-12 NOTE — Progress Notes (Signed)
Patient ID: Penny Mccullough, female   DOB: 1945-11-03, 78 y.o.   MRN: [id number]  TCTS Evening Rounds:  BP has been low 100's with mild resting sinus tachycardia.  Sats 99% 6L [place].  UO ok  Echo today shows no pericardial effusion. Severe biventricular systolic dysfunction. MV and TV working normally.

## 2023-03-12 NOTE — Evaluation (Signed)
Occupational Therapy Evaluation Patient Details Name: Penny Mccullough MRN: [id number] DOB: [birth year] Today's Date: 03/12/2023   History of Present Illness 78 y.o. female admitted 03/06/23 for planned MVR, tricuspid valve repair, redo sternotomy. PMH includes CAD s/p CABG (1989), afib on Eliquis, CHF, COPD, CVA, HTN, MI.   Clinical Impression   PTA, pt's grand daughter lived with her and pt was mod I for ADL and IADL. Upon eval, pt with O2 dependence,  decreased activity tolerance, safety, knowledge of precautions. Pt needing up to min A for Adl within precautions. Pt able to recall precautions with min cues by end of session. Pt to benefit from acute OT services and HHOT. Pending progress may not need follow up OT.       If plan is discharge home, recommend the following: A little help with walking and/or transfers;A little help with bathing/dressing/bathroom;Assistance with cooking/housework;Assist for transportation;Help with stairs or ramp for entrance    Functional Status Assessment  Patient has had a recent decline in their functional status and demonstrates the ability to make significant improvements in function in a reasonable and predictable amount of time.  Equipment Recommendations  None recommended by OT    Recommendations for Other Services       Precautions / Restrictions Precautions Precautions: Fall;Sternal;Other (comment) Precaution Booklet Issued: Yes (comment) Precaution Comments: educated and able to recall by end of session with min cues. pt with afib during session Restrictions Weight Bearing Restrictions Per Provider Order: Yes (sternal precautions)      Mobility Bed Mobility Overal bed mobility: Needs Assistance Bed Mobility: Supine to Sit, Sit to Supine     Supine to sit: Mod assist Sit to supine: Mod assist   General bed mobility comments: mod A for truncal elevation holding heart pillow and mod A bringing BLE back into bed holding heart pillow. but  then pt able to use BLE to bridge and bring bottom over to center of bed    Transfers Overall transfer level: Needs assistance Equipment used: 1 person hand held assist Transfers: Sit to/from Stand Sit to Stand: Contact guard assist           General transfer comment: CGA for safety      Balance Overall balance assessment: Mild deficits observed, not formally tested                                         ADL either performed or assessed with clinical judgement   ADL Overall ADL's : Needs assistance/impaired Eating/Feeding: Modified independent;Sitting   Grooming: Contact guard assist;Standing   Upper Body Bathing: Set up;Sitting   Lower Body Bathing: Contact guard assist;Sit to/from stand   Upper Body Dressing : Sitting;Minimal assistance   Lower Body Dressing: Contact guard assist;Sitting/lateral leans Lower Body Dressing Details (indicate cue type and reason): donning socks via figure 4 position Toilet Transfer: Ambulation;Minimal assistance Toilet Transfer Details (indicate cue type and reason): 1 person HHA         Functional mobility during ADLs: Minimal assistance (1 person HHA)       Vision Baseline Vision/History: 1 Wears glasses Ability to See in Adequate Light: 0 Adequate Patient Visual Report: No change from baseline Vision Assessment?: No apparent visual deficits     Perception Perception: Not tested       Praxis Praxis: Not tested       Pertinent Vitals/Pain Pain Assessment Pain Assessment: Faces  Faces Pain Scale: Hurts even more Pain Location: intermittent sternal pain Pain Descriptors / Indicators: Aching, Sore Pain Intervention(s): Limited activity within patient's tolerance, Monitored during session     Extremity/Trunk Assessment Upper Extremity Assessment Upper Extremity Assessment: Generalized weakness   Lower Extremity Assessment Lower Extremity Assessment: Defer to PT evaluation   Cervical / Trunk  Assessment Cervical / Trunk Assessment: Other exceptions Cervical / Trunk Exceptions: sternal incision   Communication Communication Communication: Hearing impairment   Cognition Arousal: Alert Behavior During Therapy: WFL for tasks assessed/performed Overall Cognitive Status: Within Functional Limits for tasks assessed                                 General Comments: pt HOH delaying processing but once heard pt accurately and appropriately following commands. min cues for recall of all precautions after initial education     General Comments  HR 95-133 with monitor reading in afib throughout    Exercises     Shoulder Instructions      Home Living Family/patient expects to be discharged to:: Private residence Living Arrangements: Other relatives (grand daughter) Available Help at Discharge: Family;Available PRN/intermittently (grand daughter goes to school, however, pt reports her cousin can help if needed, other grand daughter from [place] also plans to come stay for a while) Type of Home: House Home Access: Stairs to enter [employer] of Steps: 3 Entrance Stairs-Rails: Right;Left;Can reach both Home Layout: One level     Bathroom Shower/Tub: Chief Strategy Officer: Handicapped height     Home Equipment: Agricultural consultant (2 wheels);BSC/3in1          Prior Functioning/Environment Prior Level of Function : Driving;Independent/Modified Independent             Mobility Comments: did grocery shopping, no AD use ADLs Comments: indep        OT Problem List: Decreased strength;Decreased activity tolerance;Impaired balance (sitting and/or standing);Cardiopulmonary status limiting activity;Decreased knowledge of precautions;Decreased knowledge of use of DME or AE      OT Treatment/Interventions: Self-care/ADL training;Therapeutic exercise;DME and/or AE instruction;Energy conservation;Balance training;Patient/family  education;Therapeutic activities    OT Goals(Current goals can be found in the care plan section) Acute Rehab OT Goals Patient Stated Goal: go home OT Goal Formulation: With patient Time For Goal Achievement: 03/26/23 Potential to Achieve Goals: Good  OT Frequency: Min 1X/week    Co-evaluation              AM-PAC OT "6 Clicks" Daily Activity     Outcome Measure Help from another person eating meals?: None Help from another person taking care of personal grooming?: A Little Help from another person toileting, which includes using toliet, bedpan, or urinal?: A Little Help from another person bathing (including washing, rinsing, drying)?: A Little Help from another person to put on and taking off regular upper body clothing?: A Little Help from another person to put on and taking off regular lower body clothing?: A Little 6 Click Score: 19   End of Session Equipment Utilized During Treatment: Gait belt;Oxygen Nurse Communication: Mobility status  Activity Tolerance: Patient tolerated treatment well Patient left: in bed;with call bell/phone within reach  OT Visit Diagnosis: Unsteadiness on feet (R26.81);Muscle weakness (generalized) (M62.81)                Time: 1453-1530 OT Time Calculation (min): 37 min Charges:  OT General Charges $OT Visit: 1 Visit OT Evaluation $OT  Eval Moderate Complexity: 1 Mod OT Treatments $Self Care/Home Management : 8-22 mins  Tyler Deis, OTR/L [facility] Acute Rehabilitation Office: [phone]   Myrla Halsted 03/12/2023, 4:32 PM

## 2023-03-12 NOTE — Progress Notes (Signed)
      [address]       [place] [address]             [phone]      6 Days Post-Op  Procedure(s) (LRB): REDO STERNOTOMY (N/A) MITRAL VALVE (MV) REPLACEMENT, USING 29 MM MEDTRONIC MOSAIC PORCINE HEART VALVE (N/A) TRANSESOPHAGEAL ECHOCARDIOGRAM (N/A) TRICUSPID VALVE REPAIR, USING 30 MM MC3 ANNULOPLASTY RING   Total Length of Stay:  LOS: 6 days    SUBJECTIVE: Ambulated in halls this am with less oxygen needs Still a little nauseated  Vitals:   03/12/23 0500 03/12/23 0600  BP: (!) 89/69 (!) 74/56  Pulse: (!) 109 (!) 54  Resp: 13 14  Temp:    SpO2: 94% 93%    Intake/Output      01/20 0701 01/21 0700   P.O. 480   IV Piggyback 100   Total Intake(mL/kg) 580 (9.1)   Urine (mL/kg/hr) 935 (0.6)   Stool 0   Total Output 935   Net -355       Urine Occurrence 5 x   Stool Occurrence 2 x       albumin human     cefTRIAXone (ROCEPHIN)  IV 200 mL/hr at 03/11/23 1800    CBC    Component Value Date/Time   WBC 8.0 03/12/2023 0322   RBC 3.12 (L) 03/12/2023 0322   HGB 9.6 (L) 03/12/2023 0322   HGB 13.9 01/16/2023 0908   HCT 28.6 (L) 03/12/2023 0322   HCT 43.0 01/16/2023 0908   PLT 221 03/12/2023 0322   PLT 324 01/16/2023 0908   MCV 91.7 03/12/2023 0322   MCV 98 (H) 01/16/2023 0908   MCH 30.8 03/12/2023 0322   MCHC 33.6 03/12/2023 0322   RDW 14.0 03/12/2023 0322   RDW 12.0 01/16/2023 0908   LYMPHSABS 1.8 11/21/2020 1518   MONOABS 1.6 (H) 10/28/2020 0655   EOSABS 0.2 11/21/2020 1518   BASOSABS 0.1 11/21/2020 1518   CMP     Component Value Date/Time   NA 135 03/12/2023 0322   NA 143 02/05/2023 1130   K 3.8 03/12/2023 0322   CL 98 03/12/2023 0322   CO2 25 03/12/2023 0322   GLUCOSE 108 (H) 03/12/2023 0322   BUN 26 (H) 03/12/2023 0322   BUN 14 02/05/2023 1130   CREATININE 1.21 (H) 03/12/2023 0322   CALCIUM 8.1 (L) 03/12/2023 0322   PROT 7.0 03/04/2023 1430   ALBUMIN 4.0 03/04/2023 1430   AST 21 03/04/2023 1430   ALT 25 03/04/2023  1430   ALKPHOS 102 03/04/2023 1430   BILITOT 1.1 03/04/2023 1430   GFRNONAA 46 (L) 03/12/2023 0322   ABG    Component Value Date/Time   PHART 7.373 03/07/2023 0506   PCO2ART 38.3 03/07/2023 0506   PO2ART 60 (L) 03/07/2023 0506   HCO3 22.2 03/07/2023 0506   TCO2 23 03/07/2023 0506   ACIDBASEDEF 3.0 (H) 03/07/2023 0506   O2SAT 63.9 03/10/2023 1905   CBG (last 3)  Recent Labs    03/11/23 1606 03/11/23 2118 03/12/23 0637  GLUCAP 101* 125* 140*  EXAM Lungs; rhonchorous Card: IRR  Ext Warm Abd: soft Neuro; Intact    ASSESSMENT: POD #6 sp MV replacement and TV repair Hemodynamics with more hypotensive and tachycardic: will check echo Digoxin for rate control INR still low. Dosing coumadin  Eugenio Hoes, MD 03/12/2023

## 2023-03-12 NOTE — TOC Initial Note (Signed)
Transition of Care [facility]) - Initial/Assessment Note    Patient Details  Name: Penny Mccullough MRN: [id number] Date of Birth: [birth year]  Transition of Care [facility]) CM/SW Contact:    Nicanor Bake Phone Number: [phone] 03/12/2023, 4:17 PM  Clinical Narrative:    HF CSW met with pt at bedside. Pt stated that she lives with minor granddaughter. Pt stated that she has no history of HH services. Pt stated that she does not use any equipment. Pt stated that she has a scale. Pt stated that she does not have a PCP. CSW explained that a hospital follow up appointment will be scheduled closer to dc.   TOC will continue following.                Expected Discharge Plan: Home w Home Health Services Barriers to Discharge: Continued Medical Work up   Patient Goals and CMS Choice            Expected Discharge Plan and Services   Discharge Planning Services: CM Consult Post Acute Care Choice: Home Health Living arrangements for the past 2 months: Single Family Home                           HH Arranged: PT, OT HH Agency: [facility]        Prior Living Arrangements/Services Living arrangements for the past 2 months: Single Family Home Lives with:: Relatives Patient language and need for interpreter reviewed:: Yes Do you feel safe going back to the place where you live?: Yes      Need for Family Participation in Patient Care: Yes (Comment) Care giver support system in place?: Yes (comment)   Criminal Activity/Legal Involvement Pertinent to Current Situation/Hospitalization: No - Comment as needed  Activities of Daily Living      Permission Sought/Granted Permission sought to share information with : Family Supports, Case Manager                Emotional Assessment Appearance:: Appears stated age       Alcohol / Substance Use: Not Applicable Psych Involvement: No (comment)  Admission diagnosis:  S/P MVR (mitral valve replacement)  [Z95.2] Patient Active Problem List   Diagnosis Date Noted   Atrial fibrillation with RVR (HCC) 03/12/2023   Acute pulmonary edema (HCC) 03/11/2023   S/P MVR (mitral valve replacement) 03/06/2023   Mitral regurgitation 08/06/2022   CAD (coronary artery disease) 01/09/2021   COPD (chronic obstructive pulmonary disease) (HCC) 01/09/2021   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   NSTEMI (non-ST elevated myocardial infarction) (HCC) 10/27/2020   Acute lower UTI 10/27/2020   Lacunar infarction (HCC) 10/27/2020   Hyperglycemia 10/27/2020   Breast lesion 10/27/2020   Pulmonary nodule 10/27/2020   PCP:  Patient, No Pcp Per Pharmacy:   [facility] [address] [place] - [address] [place] [address] Phone: [phone] Fax: [phone]     Social Drivers of Health (SDOH) Social History: SDOH Screenings   Food Insecurity: No Food Insecurity (03/07/2023)  Housing: Low Risk  (03/07/2023)  Transportation Needs: No Transportation Needs (03/07/2023)  Utilities: Not At Risk (03/07/2023)  Depression (PHQ2-9): Low Risk  (02/16/2021)  Social Connections: Unknown (03/07/2023)  Tobacco Use: Medium Risk (03/06/2023)   SDOH Interventions:     Readmission Risk Interventions     No data to display

## 2023-03-12 NOTE — Progress Notes (Signed)
PHARMACY - ANTICOAGULATION CONSULT NOTE  Pharmacy Consult for warfarin Indication: afib + bioprosthetic mitral valve  Allergies  Allergen Reactions   Codeine Nausea And Vomiting    Patient Measurements: Height: 5\' 1"  (154.9 cm) Weight: 63.9 kg (140 lb 14 oz) IBW/kg (Calculated) : 47.8  Vital Signs: Temp: 98.1 F (36.7 C) (01/21 0830) Temp Source: Oral (01/21 0830) BP: 102/79 (01/21 0800) Pulse Rate: 126 (01/21 0800)  Labs: Recent Labs    03/10/23 0321 03/10/23 0945 03/10/23 0945 03/11/23 0740 03/12/23 0322  HGB  --  9.5*   < > 9.8* 9.6*  HCT  --  28.7*  --  29.7* 28.6*  PLT  --  194  --  206 221  LABPROT 14.3  --   --  14.5 17.3*  INR 1.1  --   --  1.1 1.4*  CREATININE 1.20*  --   --  1.19* 1.21*   < > = values in this interval not displayed.    Estimated Creatinine Clearance: 33.8 mL/min (A) (by C-G formula based on SCr of 1.21 mg/dL (H)).   Medical History: Past Medical History:  Diagnosis Date   CAD (coronary artery disease)    CHF (congestive heart failure) (HCC)    COPD (chronic obstructive pulmonary disease) (HCC)    CVA (cerebral vascular accident) (HCC)    Dysrhythmia    A.Fib   Heart murmur    History of kidney stones    HLD (hyperlipidemia)    Hypertension    MI (myocardial infarction) (HCC) 10/2020    Assessment: 78 yo female previously on apixaban for atrial fibrillation, pharmacy asked to switch to Coumadin for bioprosthetic mitral valve + afib starting 03/09/22.  INR is subtherapeutic at 1.4 (+ 0.3)  after warfarin 2.5 mg x1. CBC is stable with no signs of bleeding. Patient's appetite is slowly improving, but they are still not eating much due to mild nausea.    Goal of Therapy:  INR 2-3 Monitor platelets by anticoagulation protocol: Yes   Plan:  Coumadin 1.5 mg po x 1 tonight Monitor daily INR, CBC, and signs of bleeding  Wilmer Floor, PharmD PGY2 Cardiology Pharmacy Resident  03/12/2023 9:09 AM   [facility] pharmacy phone numbers are  listed on [email or web address]

## 2023-03-12 NOTE — Progress Notes (Signed)
NAME:  Penny Mccullough, MRN:  [id number], DOB:  1945-10-11, LOS: 6 ADMISSION DATE:  03/06/2023, CONSULTATION DATE: 03/06/2023 REFERRING MD: Eugenio Hoes, CHIEF COMPLAINT: Status post mitral valve replacement/tricuspid valve repair  History of Present Illness:  78 year old female with COPD, ex-smoker coronary artery disease status post CABG and MI status post LAD stent, paroxysmal A-fib on Eliquis and amiodarone and rheumatic mitral valve disease who underwent workup for exertional dyspnea, noted to have severe mitral regurgitation and tricuspid regurgitation, today she underwent mitral valve replacement and tricuspid valve repair.  She was transferred to ICU postop, PCCM was consulted for help evaluation medical management  Pertinent  Medical History   Past Medical History:  Diagnosis Date   CAD (coronary artery disease)    CHF (congestive heart failure) (HCC)    COPD (chronic obstructive pulmonary disease) (HCC)    CVA (cerebral vascular accident) (HCC)    Dysrhythmia    A.Fib   Heart murmur    History of kidney stones    HLD (hyperlipidemia)    Hypertension    MI (myocardial infarction) (HCC) 10/2020     Significant Hospital Events: Including procedures, antibiotic start and stop dates in addition to other pertinent events     Interim History / Subjective:  Walked pretty well this morning. Did get somewhat short of breath but it is the best she has done.  Some low BP overnight and back in RVR this morning No complaints at rest.  Diuresed well. Several unmeasured urinary outputs, but she is down about 6 lbs in the las 4 days.  Still on 3L with sats in the low 90s.   Objective   Blood pressure 127/87, pulse (!) 119, temperature 97.6 F (36.4 C), temperature source Oral, resp. rate (!) 21, height 5\' 1"  (1.549 m), weight 63.9 kg, SpO2 (!) 86%.    FiO2 (%):  [40 %] 40 %   Intake/Output Summary (Last 24 hours) at 03/12/2023 0716 Last data filed at 03/11/2023 2333 Gross per 24 hour   Intake 580 ml  Output 935 ml  Net -355 ml   Filed Weights   03/09/23 0530 03/10/23 0500 03/11/23 0500  Weight: 65.2 kg 64.7 kg 63.9 kg    Examination: General:  elderly female in NAD Neuro:  Alert, oriented, non-focal. Hard of hearing.  HEENT: [place]/AT, PERRL, no JVD Cardiovascular:  IRIR tachy, no mrg Lungs:  Lung sound clear. Wheeze gone.  Abdomen:  Soft, NT. ND Musculoskeletal: No acute deformity or ROM limitation Skin:  Grossly intact.    Resolved Hospital Problem list     Assessment & Plan:   Acute respiratory failure with hypoxia secondary to pulmonary edema COPD +/- acute exacerbation CXR with L>R interstitial markings. PCT 0.7.  - Continue to diurese as she will tolerate. CXR pending to help guide therapy. She is down 6 lbs, but is still up 6 lbs from her presurgical weight. No over signs of volume overload.  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 Per pharmacy.   HTN HLD - some low BPs overnight. Seem to have resolved now.  - Per TCTS - Well controlled with diuretic and PRNs at this time  Postop N/V - reglan -  bowel regimen   Joneen Roach, AGACNP-BC [facility]  See Amion for personal pager PCCM on call pager [phone] until 7pm. Please call Elink 7p-7a. [phone]  03/12/2023 7:16 AM

## 2023-03-12 NOTE — Progress Notes (Signed)
Echocardiogram 2D Echocardiogram has been performed.  Warren Lacy Cleaster Shiffer RDCS 03/12/2023, 11:12 AM

## 2023-03-13 ENCOUNTER — Inpatient Hospital Stay (HOSPITAL_COMMUNITY): Payer: 59

## 2023-03-13 ENCOUNTER — Other Ambulatory Visit: Payer: Self-pay | Admitting: Thoracic Surgery (Cardiothoracic Vascular Surgery)

## 2023-03-13 ENCOUNTER — Other Ambulatory Visit: Payer: Self-pay

## 2023-03-13 DIAGNOSIS — I340 Nonrheumatic mitral (valve) insufficiency: Secondary | ICD-10-CM | POA: Diagnosis not present

## 2023-03-13 DIAGNOSIS — I5021 Acute systolic (congestive) heart failure: Secondary | ICD-10-CM

## 2023-03-13 DIAGNOSIS — Z952 Presence of prosthetic heart valve: Secondary | ICD-10-CM | POA: Diagnosis not present

## 2023-03-13 DIAGNOSIS — I480 Paroxysmal atrial fibrillation: Secondary | ICD-10-CM | POA: Diagnosis not present

## 2023-03-13 DIAGNOSIS — I4891 Unspecified atrial fibrillation: Secondary | ICD-10-CM | POA: Diagnosis not present

## 2023-03-13 LAB — CBC
HCT: 27.5 % — ABNORMAL LOW (ref 36.0–46.0)
Hemoglobin: 9.1 g/dL — ABNORMAL LOW (ref 12.0–15.0)
MCH: 30.7 pg (ref 26.0–34.0)
MCHC: 33.1 g/dL (ref 30.0–36.0)
MCV: 92.9 fL (ref 80.0–100.0)
Platelets: 265 10*3/uL (ref 150–400)
RBC: 2.96 MIL/uL — ABNORMAL LOW (ref 3.87–5.11)
RDW: 14 % (ref 11.5–15.5)
WBC: 10.1 10*3/uL (ref 4.0–10.5)
nRBC: 0.6 % — ABNORMAL HIGH (ref 0.0–0.2)

## 2023-03-13 LAB — BASIC METABOLIC PANEL
Anion gap: 9 (ref 5–15)
BUN: 28 mg/dL — ABNORMAL HIGH (ref 8–23)
CO2: 29 mmol/L (ref 22–32)
Calcium: 8.3 mg/dL — ABNORMAL LOW (ref 8.9–10.3)
Chloride: 98 mmol/L (ref 98–111)
Creatinine, Ser: 1.17 mg/dL — ABNORMAL HIGH (ref 0.44–1.00)
GFR, Estimated: 48 mL/min — ABNORMAL LOW (ref >60)
Glucose, Bld: 118 mg/dL — ABNORMAL HIGH (ref 70–99)
Potassium: 4.5 mmol/L (ref 3.5–5.1)
Sodium: 136 mmol/L (ref 135–145)

## 2023-03-13 LAB — GLUCOSE, CAPILLARY
Glucose-Capillary: 123 mg/dL — ABNORMAL HIGH (ref 70–99)
Glucose-Capillary: 122 mg/dL — ABNORMAL HIGH (ref 70–99)
Glucose-Capillary: 146 mg/dL — ABNORMAL HIGH (ref 70–99)
Glucose-Capillary: 116 mg/dL — ABNORMAL HIGH (ref 70–99)

## 2023-03-13 LAB — MAGNESIUM: Magnesium: 2.2 mg/dL (ref 1.7–2.4)

## 2023-03-13 LAB — COOXEMETRY PANEL
Carboxyhemoglobin: 1.7 % — ABNORMAL HIGH (ref 0.5–1.5)
Methemoglobin: <0.7 % (ref 0.0–1.5)
O2 Saturation: 47.3 %
Total hemoglobin: 9 g/dL — ABNORMAL LOW (ref 12.0–16.0)

## 2023-03-13 LAB — PROTIME-INR
INR: 1.6 — ABNORMAL HIGH (ref 0.8–1.2)
Prothrombin Time: 19 s — ABNORMAL HIGH (ref 11.4–15.2)

## 2023-03-13 MED ORDER — WARFARIN SODIUM 1 MG PO TABS
1.5000 mg | ORAL_TABLET | Freq: Once | ORAL | Status: AC
  Administered 2023-03-13: 1.5 mg via ORAL
  Filled 2023-03-13: qty 1

## 2023-03-13 MED ORDER — DIGOXIN 125 MCG PO TABS
0.1250 mg | ORAL_TABLET | Freq: Every day | ORAL | Status: DC
  Administered 2023-03-14: 0.125 mg via ORAL
  Filled 2023-03-13: qty 1

## 2023-03-13 MED ORDER — SODIUM CHLORIDE 0.9% FLUSH: 10.0000 mL | INTRAVENOUS | Status: DC | PRN

## 2023-03-13 MED ORDER — SODIUM CHLORIDE 0.9% FLUSH
10.0000 mL | Freq: Two times a day (BID) | INTRAVENOUS | Status: DC
  Administered 2023-03-13 – 2023-03-18 (×10): 10 mL
  Administered 2023-03-18: 30 mL
  Administered 2023-03-19 – 2023-03-22 (×5): 10 mL

## 2023-03-13 NOTE — Progress Notes (Addendum)
[address]       [place] [address]             [phone]      7 Days Post-Op Procedure(s) (LRB): REDO STERNOTOMY (N/A) MITRAL VALVE (MV) REPLACEMENT, USING 29 MM MEDTRONIC MOSAIC PORCINE HEART VALVE (N/A) TRANSESOPHAGEAL ECHOCARDIOGRAM (N/A) TRICUSPID VALVE REPAIR, USING 30 MM MC3 ANNULOPLASTY RING Subjective: The patient states she is a little tired this AM. Sitting in the chair and has eaten some of her breakfast. She admits to pain with deep breaths but denies SOB this AM.   Objective: Vital signs in last 24 hours: Temp:  [97.6 F (36.4 C)-98.6 F (37 C)] 97.6 F (36.4 C) (01/22 0340) Pulse Rate:  [78-139] 104 (01/22 0600) Cardiac Rhythm: Atrial fibrillation (01/22 0400) Resp:  [10-27] 16 (01/22 0600) BP: (90-123)/(64-91) 123/88 (01/22 0600) SpO2:  [84 %-100 %] 97 % (01/22 0600) Weight:  [63.4 kg] 63.4 kg (01/22 0500)  Hemodynamic parameters for last 24 hours:    Intake/Output from previous day: 01/21 0701 - 01/22 0700 In: 240 [P.O.:240] Out: 1100 [Urine:1100] Intake/Output this shift: No intake/output data recorded.  General appearance: alert, cooperative, and no distress Neurologic: intact Heart: sinus tachycardia, no murmur Lungs: diminished bibasilar breath sounds Abdomen: soft, non-tender; bowel sounds normal; no masses,  no organomegaly Extremities: edema trace Wound: Clean and dry without sign of infection  Lab Results: Recent Labs    03/12/23 0322 03/13/23 0419  WBC 8.0 10.1  HGB 9.6* 9.1*  HCT 28.6* 27.5*  PLT 221 265   BMET:  Recent Labs    03/12/23 0322 03/13/23 0419  NA 135 136  K 3.8 4.5  CL 98 98  CO2 25 29  GLUCOSE 108* 118*  BUN 26* 28*  CREATININE 1.21* 1.17*  CALCIUM 8.1* 8.3*    PT/INR:  Recent Labs    03/13/23 0419  LABPROT 19.0*  INR 1.6*   ABG    Component Value Date/Time   PHART 7.373 03/07/2023 0506   HCO3 22.2 03/07/2023 0506   TCO2 23 03/07/2023 0506   ACIDBASEDEF 3.0 (H)  03/07/2023 0506   O2SAT 63.9 03/10/2023 1905   CBG (last 3)  Recent Labs    03/12/23 1648 03/12/23 2118 03/13/23 0627  GLUCAP 105* 116* 123*    Assessment/Plan: S/P Procedure(s) (LRB): REDO STERNOTOMY (N/A) MITRAL VALVE (MV) REPLACEMENT, USING 29 MM MEDTRONIC MOSAIC PORCINE HEART VALVE (N/A) TRANSESOPHAGEAL ECHOCARDIOGRAM (N/A) TRICUSPID VALVE REPAIR, USING 30 MM MC3 ANNULOPLASTY RING  CV: Acute on Chronic HFrEF. Echo 1/21 EF 25-30%. AHF team following, appreciate their recommendations. SBP 102 this AM. Hx of PAF on Eliquis at home, cardioversion in 2023. Now with postop Atrial fibrillation with RVR. On Amiodarone 400mg  BID and Digoxin 0.25 daily. IV Amiodarone d/c'd due to bradycardia. Looks like she is converting between afib and ST this AM, HR low 100s-110s. Eventual DCCV. Per AHF team may need central access if the patient is not responding well to treatment.    Pulm: Hx of COPD. Last CXR with slightly improved vascular congestion, small bilateral pleural effusions and bibasilar atelectasis. Saturating 97% on 5L [place]. Continue nebs, IS, flutter valve and ambulation. Wean oxygen as tolerated for O2>90%   GI: Patient with large BM, feels much better. Appetite and nausea improving. Reglan completed, continue bowel regimen and zofran prn.    Endo: Preop A1C 6.1. On Jardiance at home. CBGs controlled on SSI AC/HS.    Renal: Cr 1.17, stable. UO 1100cc/24hrs. +5lbs  from preop. Lasix IV 80mg  BID today per AHF team. K 4.5. Mg 2.2.    ID: Leukocytosis resolved. Tmax 98.6. Completed Ceftriaxone for UTI.    Expected postop ABLA: H/H 9.1/27.5. Reactive thrombocytopenia resolved. Not clinically significant at this time   INR: Coumadin per pharmacy. Coumadin 1.5mg  yesterday. INR 1.6  Deconditioning: Continue work with PT/OT, recommending HH PT/OT at discharge.    Dispo: Continue ICU care     LOS: 7 days    Jenny Reichmann, PA-C 03/13/2023

## 2023-03-13 NOTE — Progress Notes (Signed)
Peripherally Inserted Central Catheter Placement  The IV Nurse has discussed with the patient and/or persons authorized to consent for the patient, the purpose of this procedure and the potential benefits and risks involved with this procedure.  The benefits include less needle sticks, lab draws from the catheter, and the patient may be discharged home with the catheter. Risks include, but not limited to, infection, bleeding, blood clot (thrombus formation), and puncture of an artery; nerve damage and irregular heartbeat and possibility to perform a PICC exchange if needed/ordered by physician.  Alternatives to this procedure were also discussed.  Bard Power PICC patient education guide, fact sheet on infection prevention and patient information card has been provided to patient /or left at bedside.    PICC Placement Documentation  PICC Triple Lumen 03/13/23 Right Basilic 40 cm 0 cm (Active)  Indication for Insertion or Continuance of Line Vasoactive infusions 03/13/23 1802  Exposed Catheter (cm) 0 cm 03/13/23 1802  Site Assessment Clean, Dry, Intact 03/13/23 1802  Lumen #1 Status Flushed;Blood return noted;Saline locked 03/13/23 1802  Lumen #2 Status Flushed;Saline locked;Blood return noted 03/13/23 1802  Lumen #3 Status Flushed;Saline locked;Blood return noted 03/13/23 1802  Dressing Type Transparent 03/13/23 1802  Dressing Status Antimicrobial disc/dressing in place 03/13/23 1802  Line Care Connections checked and tightened 03/13/23 1802  Line Adjustment (NICU/IV Team Only) No 03/13/23 1802  Dressing Change Due 03/20/23 03/13/23 1802       Consuello Masse 03/13/2023, 6:04 PM

## 2023-03-13 NOTE — Progress Notes (Addendum)
NAME:  Penny Mccullough, MRN:  [id number], DOB:  06-20-1945, LOS: 7 ADMISSION DATE:  03/06/2023, CONSULTATION DATE: 03/06/2023 REFERRING MD: Eugenio Hoes, CHIEF COMPLAINT: Status post mitral valve replacement/tricuspid valve repair  History of Present Illness:  78 year old female with COPD, ex-smoker coronary artery disease status post CABG and MI status post LAD stent, paroxysmal A-fib on Eliquis and amiodarone and rheumatic mitral valve disease who underwent workup for exertional dyspnea, noted to have severe mitral regurgitation and tricuspid regurgitation, today she underwent mitral valve replacement and tricuspid valve repair.  She was transferred to ICU postop, PCCM was consulted for help evaluation medical management  Pertinent  Medical History   Past Medical History:  Diagnosis Date   CAD (coronary artery disease)    CHF (congestive heart failure) (HCC)    COPD (chronic obstructive pulmonary disease) (HCC)    CVA (cerebral vascular accident) (HCC)    Dysrhythmia    A.Fib   Heart murmur    History of kidney stones    HLD (hyperlipidemia)    Hypertension    MI (myocardial infarction) (HCC) 10/2020     Significant Hospital Events: Including procedures, antibiotic start and stop dates in addition to other pertinent events     Interim History / Subjective:  Feeling OK this morning about the same Hasn't walked yet On 3L Unmeasured urine output overnight, but is down about 1 lb from yesterday.  Remains tachy on monitor   Objective   Blood pressure 123/88, pulse (!) 104, temperature 97.6 F (36.4 C), temperature source Oral, resp. rate 16, height 5\' 1"  (1.549 m), weight 63.4 kg, SpO2 97%.        Intake/Output Summary (Last 24 hours) at 03/13/2023 0718 Last data filed at 03/13/2023 0700 Gross per 24 hour  Intake 240 ml  Output 1100 ml  Net -860 ml   Filed Weights   03/10/23 0500 03/11/23 0500 03/13/23 0500  Weight: 64.7 kg 63.9 kg 63.4 kg    Examination: General:   elderly female in NAD, sitting in chair.  Neuro:  Alert, oriented, non-focal HEENT: [place]/AT, PERRL, no JVD Cardiovascular: tachy regular, no MRG Lungs:  Clear bilateral breath sounds  Abdomen:  Soft, NT, ND Musculoskeletal: No acute deformity or ROM limitation. No edema.  Skin:  Grossly intact.   Resolved Hospital Problem list     Assessment & Plan:    Acute respiratory failure with hypoxia secondary to pulmonary edema COPD +/- acute exacerbation CXR with L>R interstitial markings. PCT 0.7.  - Continue to diurese as she will tolerate. There have been several unmeasured outputs. She is down 7 lbs, but is still up 5 lbs from her presurgical weight. No over signs of volume overload.  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 By echo 1/21. LVEF 25-30% Global HK.   - starting with control of AF and increased diuretics - May need PICC for Coox and possible inotrope therapy.   Paroxysmal A-fib on Eliquis at home - Dig - Amio restarted - Appreciate cardiology, planning to cardiovert at some point.  - Warfarin for AC, subtherapeutic. Per pharmacy.   HLD - Statin  Postop N/V - reglan - bowel regimen   Joneen Roach, AGACNP-BC [facility]  See Amion for personal pager PCCM  on call pager [phone] until 7pm. Please call Elink 7p-7a. [phone]  03/13/2023 7:18 AM

## 2023-03-13 NOTE — Progress Notes (Addendum)
Advanced Heart Failure Rounding Note  Cardiologist: None  Chief Complaint: SOB   Patient Profile  78 yo woman with PAF, COPD, CAD s/p previous CABG (LIMA) -> LIMA occluded. Patent LAD stents cath 2/24. 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    Underwent MV Replacement/TV Repair on 03/06/23. Post op course c/b afib w/ RVR and biventricular failure R>L (drop in LVEF 40-45%>>25-30%, RV severely reduced).   Subjective:    Feels better today but still SOB, just returned from a walk w/ PT.   Remains in Afib 110s-120s. BP soft low 90s systolic. Scr stable, 1.12.  Still 5 lb above pre-op wt. JVD elevated. Awaiting PICC line placement.    Objective:   Weight Range: 63.4 kg Body mass index is 26.41 kg/m.   Vital Signs:   Temp:  [97.4 F (36.3 C)-98.6 F (37 C)] 97.4 F (36.3 C) (01/22 0730) Pulse Rate:  [78-119] 112 (01/22 1052) Resp:  [10-24] 14 (01/22 1000) BP: (87-123)/(61-91) 87/61 (01/22 1000) SpO2:  [84 %-100 %] 99 % (01/22 1000) Weight:  [63.4 kg] 63.4 kg (01/22 0500) Last BM Date : 03/12/23  Weight change: Filed Weights   03/10/23 0500 03/11/23 0500 03/13/23 0500  Weight: 64.7 kg 63.9 kg 63.4 kg    Intake/Output:   Intake/Output Summary (Last 24 hours) at 03/13/2023 1135 Last data filed at 03/13/2023 1100 Gross per 24 hour  Intake 120 ml  Output 1000 ml  Net -880 ml      Physical Exam    General:  elderly F. Fatigued appearing. No resp difficulty HEENT: Normal Neck: Supple. JVP 12 cm . Carotids 2+ bilat; no bruits. No lymphadenopathy or thyromegaly appreciated. Cor: PMI nondisplaced. Irregularly irregular rhythm and rate No rubs, gallops or murmurs. Lungs: decreased BS at the bases bilaterally  Abdomen: Soft, nontender, nondistended. No hepatosplenomegaly. No bruits or masses. Good bowel sounds. Extremities: No cyanosis, clubbing, rash, 1-2+ b/l LE edema Neuro: Alert & orientedx3, cranial nerves grossly intact. moves all 4 extremities w/o difficulty. Affect  pleasant   Telemetry   Afib 110s-120s   EKG   Afib 103 bpm   Labs    CBC Recent Labs    03/12/23 0322 03/13/23 0419  WBC 8.0 10.1  HGB 9.6* 9.1*  HCT 28.6* 27.5*  MCV 91.7 92.9  PLT 221 265   Basic Metabolic Panel Recent Labs    16/10/96 0322 03/13/23 0419  NA 135 136  K 3.8 4.5  CL 98 98  CO2 25 29  GLUCOSE 108* 118*  BUN 26* 28*  CREATININE 1.21* 1.17*  CALCIUM 8.1* 8.3*  MG 2.2 2.2   Liver Function Tests No results for input(s): "AST", "ALT", "ALKPHOS", "BILITOT", "PROT", "ALBUMIN" in the last 72 hours. No results for input(s): "LIPASE", "AMYLASE" in the last 72 hours. Cardiac Enzymes No results for input(s): "CKTOTAL", "CKMB", "CKMBINDEX", "TROPONINI" in the last 72 hours.  BNP: BNP (last 3 results) Recent Labs    06/18/22 1458 07/19/22 1227 02/05/23 1130  BNP 363.7* 256.9* 519.0*    ProBNP (last 3 results) Recent Labs    08/15/22 1209  PROBNP 435     D-Dimer No results for input(s): "DDIMER" in the last 72 hours. Hemoglobin A1C No results for input(s): "HGBA1C" in the last 72 hours. Fasting Lipid Panel No results for input(s): "CHOL", "HDL", "LDLCALC", "TRIG", "CHOLHDL", "LDLDIRECT" in the last 72 hours. Thyroid Function Tests No results for input(s): "TSH", "T4TOTAL", "T3FREE", "THYROIDAB" in the last 72 hours.  Invalid input(s): "FREET3"  Other  results:   Imaging    [place] EKG SITE RITE Result Date: 03/13/2023 If [facility] image not attached, placement could not be confirmed due to current cardiac rhythm.    Medications:     Scheduled Medications:  amiodarone  400 mg Oral BID   arformoterol  15 mcg Nebulization BID   aspirin  81 mg Oral Daily   atorvastatin  80 mg Oral Daily   bisacodyl  10 mg Oral Daily   Or   bisacodyl  10 mg Rectal Daily   budesonide (PULMICORT) nebulizer solution  0.5 mg Nebulization BID   Chlorhexidine Gluconate Cloth  6 each Topical Daily   [START ON 03/14/2023] digoxin  0.125 mg Oral Daily    docusate sodium  200 mg Oral Daily   ezetimibe  10 mg Oral Daily   feeding supplement  237 mL Oral BID BM   furosemide  80 mg Intravenous Q12H   insulin aspart  0-15 Units Subcutaneous TID WC   mouth rinse  15 mL Mouth Rinse 4 times per day   pantoprazole  40 mg Oral Daily   polyethylene glycol  17 g Oral Daily   revefenacin  175 mcg Nebulization Daily   warfarin  1.5 mg Oral ONCE-1600   Warfarin - Pharmacist Dosing Inpatient   Does not apply q1600    Infusions:  albumin human      PRN Medications: albumin human, albuterol, HYDROmorphone (DILAUDID) injection, lactulose, metoprolol tartrate, ondansetron, mouth rinse, oxyCODONE, traMADol    Assessment/Plan   1. Severe MR/TR - 2/2 rheumatic heart disease  - S/P MVReplacement (bio) TVRepair, LAA - trivial MR on echo 1/21   2. PAF - Developed A fib 2022. Had cardioversion 2023.  -Back in A fib RVR post-op.  - on amio 400 mg twice daily. Once PICC placed, will transition to PO amio  - On coumadin. INR 1.6 - will need DCCV if no conversion w/ amiodarone.  TEE not indicated (s/p LAA clip)   3. Acute Biventricular HFrEF - Intra-op TEE EF 40-45% - Post op Echo EF down to 25-30% with RV severely reduced.  - A fib RVR likely contributing.  - Volume up on exam. Distal ext cool. Concern for possible low output - Place PICC today and check Co-ox and follow CVPs - continue IV Lasix - place TED hoses  - GDMT limited currently by soft BP    4. Acute Hypoxic Respiratory Failure - COPD at baseline. She does require oxygen at home. Oxygen requirements increased to 6 liters post op, likely triggered by acute CHF  - Continue diuresis per above   5. Anemia, Expected Blood Loss - hgb stable, 9.1 today    Length of Stay: [address] PA-C  03/13/2023, 11:35 AM  Advanced Heart Failure Team Pager [phone] (M-F; 7a - 5p)  Please contact [facility] for night-coverage after hours (5p -7a ) and weekends on  [email or web address]  Patient seen and examined with the above-signed Advanced Practice Provider and/or Housestaff. I personally reviewed laboratory data, imaging studies and relevant notes. I independently examined the patient and formulated the important aspects of the plan. I have edited the note to reflect any of my changes or salient points. I have personally discussed the plan with the patient and/or family.  She is feeling a bit better. Less dyspneic. Diuresed some overnight on increased dose of lasix.   Remains in AF with RVR.   No CP  General:  Weak appearing. No resp difficulty HEENT: normal  Neck: supple. JVP 10 Carotids 2+ bilat; no bruits. No lymphadenopathy or thryomegaly appreciated. Cor: Irreg tachy  Lungs: clear Abdomen: soft, nontender, nondistended. No hepatosplenomegaly. No bruits or masses. Good bowel sounds. Extremities: no cyanosis, clubbing, rash, tr edema cool Neuro: alert & orientedx3, cranial nerves grossly intact. moves all 4 extremities w/o difficulty. Affect pleasant  She has post-op biventricular failure. (Likely RV > LV).  Continue to diurese. Place PICC. May need short course of milrinone support. GDMT currently limited by low BP.   Start IV amio once PICC in.   Will need DC-CV when INR therapeutic (LAA ligated)  Arvilla Meres, MD  7:07 PM

## 2023-03-13 NOTE — Progress Notes (Signed)
PHARMACY - ANTICOAGULATION CONSULT NOTE  Pharmacy Consult for warfarin Indication: afib + bioprosthetic mitral valve  Allergies  Allergen Reactions   Codeine Nausea And Vomiting    Patient Measurements: Height: 5\' 1"  (154.9 cm) Weight: 63.4 kg (139 lb 12.4 oz) IBW/kg (Calculated) : 47.8  Vital Signs: Temp: 97.6 F (36.4 C) (01/22 0340) Temp Source: Oral (01/22 0340) BP: 123/88 (01/22 0600) Pulse Rate: 104 (01/22 0600)  Labs: Recent Labs    03/11/23 0740 03/12/23 0322 03/13/23 0419  HGB 9.8* 9.6* 9.1*  HCT 29.7* 28.6* 27.5*  PLT 206 221 265  LABPROT 14.5 17.3* 19.0*  INR 1.1 1.4* 1.6*  CREATININE 1.19* 1.21* 1.17*    Estimated Creatinine Clearance: 34.9 mL/min (A) (by C-G formula based on SCr of 1.17 mg/dL (H)).   Medical History: Past Medical History:  Diagnosis Date   CAD (coronary artery disease)    CHF (congestive heart failure) (HCC)    COPD (chronic obstructive pulmonary disease) (HCC)    CVA (cerebral vascular accident) (HCC)    Dysrhythmia    A.Fib   Heart murmur    History of kidney stones    HLD (hyperlipidemia)    Hypertension    MI (myocardial infarction) (HCC) 10/2020    Assessment: 78 yo female previously on apixaban for atrial fibrillation, pharmacy asked to switch to Coumadin for bioprosthetic mitral valve + afib starting 03/09/22.  INR is subtherapeutic at 1.6 (+ 0.2)  after warfarin 1.5 mg x1. CBC is stable with no signs of bleeding. Patient's appetite is slowly improving, and they are starting to eat more not eating much due to mild nausea. Amiodarone was resumed 1/21, and anticipate this to increase serum warfarin levels.    Goal of Therapy:  INR 2-3 Monitor platelets by anticoagulation protocol: Yes   Plan:  Coumadin 1.5 mg po x 1 tonight Monitor daily INR, CBC, and signs of bleeding Monitor diet, amiodarone administration, and other potential DDI.  Wilmer Floor, PharmD PGY2 Cardiology Pharmacy Resident  03/13/2023 7:30  AM   [facility] pharmacy phone numbers are listed on [email or web address]

## 2023-03-13 NOTE — Progress Notes (Signed)
Physical Therapy Treatment Patient Details Name: Penny Mccullough MRN: [id number] DOB: 1945/08/07 Today's Date: 03/13/2023   History of Present Illness 78 y.o. female admitted 03/06/23 for planned MVR, tricuspid valve repair, redo sternotomy. PMH includes CAD s/p CABG (1989), afib on Eliquis, CHF, COPD, CVA, HTN, MI.    PT Comments  Pt is progressing towards goals. Currently pt is CGA/Min A for supine to sitting and Min A for assist with LE For sitting to supine. Pt requires Mod verbal cues throughout session to maintain sternal precautions. Pt was able to name all sternal precautions prior to mobility. Pt was CGA with RW for 160 ft of gait with O2 sats WNL and HR up to 123 bpm. Due to pt current functional status, home set up and available assistance at home recommending skilled physical therapy services 3x/week in order to address strength, balance and functional mobility to decrease risk for falls, injury and re-hospitalization.      If plan is discharge home, recommend the following: A little help with walking and/or transfers;Assistance with cooking/housework;Assist for transportation;Help with stairs or ramp for entrance     Equipment Recommendations  Rollator (4 wheels)       Precautions / Restrictions Precautions Precautions: Fall;Sternal;Other (comment) Precaution Booklet Issued: No Precaution Comments: reviewed precautions throughout session Restrictions Weight Bearing Restrictions Per Provider Order: Yes RUE Weight Bearing Per Provider Order: Non weight bearing LUE Weight Bearing Per Provider Order: Non weight bearing     Mobility  Bed Mobility Overal bed mobility: Needs Assistance Bed Mobility: Supine to Sit, Sit to Supine     Supine to sit: Contact guard, Min assist Sit to supine: Min assist   General bed mobility comments: CGA to Min A for supine to sitting with multi modal cues to maintain sternal precautions throughout. Pt requires Min A for bil LE for sitting to  supine.    Transfers Overall transfer level: Needs assistance Equipment used: None Transfers: Sit to/from Stand Sit to Stand: Contact guard assist           General transfer comment: CGA for safety, verbal cues to maintain sternal precautions.    Ambulation/Gait Ambulation/Gait assistance: Contact guard assist Gait Distance (Feet): 160 Feet Assistive device: Rolling walker (2 wheels) Gait Pattern/deviations: Step-through pattern, Decreased stride length   Gait velocity interpretation: 1.31 - 2.62 ft/sec, indicative of limited community ambulator   General Gait Details: Good AD management throughout. O2 sats up 93-98% on 3L O2 via [place]. HR up to 123 bpm.      Balance Overall balance assessment: Mild deficits observed, not formally tested       Cognition Arousal: Alert Behavior During Therapy: WFL for tasks assessed/performed Overall Cognitive Status: Within Functional Limits for tasks assessed           General Comments General comments (skin integrity, edema, etc.): HR upto 123 bpm. O2 sats at 93-98% on 3L O2 via [place]. Pt is incontinent of bladder and on standing left purewick in initially due to increased urination due to gravity.      Pertinent Vitals/Pain Pain Assessment Pain Assessment: Faces Faces Pain Scale: Hurts little more Facial Expression: Grimacing Body Movements: Protection Muscle Tension: Tense, rigid Compliance with ventilator (intubated pts.): N/A Vocalization (extubated pts.): Talking in normal tone or no sound CPOT Total: 4 Pain Location: intermittent sternal pain Pain Descriptors / Indicators: Aching, Sore Pain Intervention(s): Monitored during session, Limited activity within patient's tolerance     PT Goals (current goals can now be found in the care  plan section) Acute Rehab PT Goals Patient Stated Goal: home PT Goal Formulation: With patient Time For Goal Achievement: 03/25/23 Potential to Achieve Goals: Good Progress towards PT  goals: Progressing toward goals    Frequency    Min 3X/week      PT Plan  Continue with current POC        AM-PAC PT "6 Clicks" Mobility   Outcome Measure  Help needed turning from your back to your side while in a flat bed without using bedrails?: A Little Help needed moving from lying on your back to sitting on the side of a flat bed without using bedrails?: A Little Help needed moving to and from a bed to a chair (including a wheelchair)?: A Little Help needed standing up from a chair using your arms (e.g., wheelchair or bedside chair)?: A Little Help needed to walk in hospital room?: A Little Help needed climbing 3-5 steps with a railing? : A Lot 6 Click Score: 17    End of Session Equipment Utilized During Treatment: Oxygen;Gait belt Activity Tolerance: Patient tolerated treatment well Patient left: in bed;with call bell/phone within reach Nurse Communication: Mobility status PT Visit Diagnosis: Unsteadiness on feet (R26.81);Muscle weakness (generalized) (M62.81)     Time: 1023-1050 PT Time Calculation (min) (ACUTE ONLY): 27 min  Charges:    $Therapeutic Activity: 23-37 mins PT General Charges $$ ACUTE PT VISIT: 1 Visit                     Harrel Carina, DPT, CLT  Acute Rehabilitation Services Office: [phone] (Secure chat preferred)    [name] 03/13/2023, 10:56 AM

## 2023-03-14 DIAGNOSIS — J449 Chronic obstructive pulmonary disease, unspecified: Secondary | ICD-10-CM | POA: Diagnosis not present

## 2023-03-14 DIAGNOSIS — I480 Paroxysmal atrial fibrillation: Secondary | ICD-10-CM | POA: Diagnosis not present

## 2023-03-14 DIAGNOSIS — E785 Hyperlipidemia, unspecified: Secondary | ICD-10-CM

## 2023-03-14 DIAGNOSIS — I340 Nonrheumatic mitral (valve) insufficiency: Secondary | ICD-10-CM | POA: Diagnosis not present

## 2023-03-14 DIAGNOSIS — Z952 Presence of prosthetic heart valve: Secondary | ICD-10-CM | POA: Diagnosis not present

## 2023-03-14 DIAGNOSIS — I4891 Unspecified atrial fibrillation: Secondary | ICD-10-CM | POA: Diagnosis not present

## 2023-03-14 LAB — BASIC METABOLIC PANEL
Anion gap: 8 (ref 5–15)
BUN: 27 mg/dL — ABNORMAL HIGH (ref 8–23)
CO2: 32 mmol/L (ref 22–32)
Calcium: 7.8 mg/dL — ABNORMAL LOW (ref 8.9–10.3)
Chloride: 90 mmol/L — ABNORMAL LOW (ref 98–111)
Creatinine, Ser: 0.94 mg/dL (ref 0.44–1.00)
GFR, Estimated: >60 mL/min (ref >60)
Glucose, Bld: 115 mg/dL — ABNORMAL HIGH (ref 70–99)
Potassium: 4 mmol/L (ref 3.5–5.1)
Sodium: 130 mmol/L — ABNORMAL LOW (ref 135–145)

## 2023-03-14 LAB — COOXEMETRY PANEL
Carboxyhemoglobin: 1.5 % (ref 0.5–1.5)
Carboxyhemoglobin: 1.9 % — ABNORMAL HIGH (ref 0.5–1.5)
Carboxyhemoglobin: 2 % — ABNORMAL HIGH (ref 0.5–1.5)
Methemoglobin: <0.7 % (ref 0.0–1.5)
Methemoglobin: <0.7 % (ref 0.0–1.5)
Methemoglobin: <0.7 % (ref 0.0–1.5)
O2 Saturation: 48.5 %
O2 Saturation: 52.2 %
O2 Saturation: 59.8 %
Total hemoglobin: 10 g/dL — ABNORMAL LOW (ref 12.0–16.0)
Total hemoglobin: 9.8 g/dL — ABNORMAL LOW (ref 12.0–16.0)
Total hemoglobin: 9.6 g/dL — ABNORMAL LOW (ref 12.0–16.0)

## 2023-03-14 LAB — GLUCOSE, CAPILLARY
Glucose-Capillary: 114 mg/dL — ABNORMAL HIGH (ref 70–99)
Glucose-Capillary: 109 mg/dL — ABNORMAL HIGH (ref 70–99)
Glucose-Capillary: 117 mg/dL — ABNORMAL HIGH (ref 70–99)
Glucose-Capillary: 121 mg/dL — ABNORMAL HIGH (ref 70–99)

## 2023-03-14 LAB — CBC
HCT: 28.5 % — ABNORMAL LOW (ref 36.0–46.0)
Hemoglobin: 9.2 g/dL — ABNORMAL LOW (ref 12.0–15.0)
MCH: 30.4 pg (ref 26.0–34.0)
MCHC: 32.3 g/dL (ref 30.0–36.0)
MCV: 94.1 fL (ref 80.0–100.0)
Platelets: 299 10*3/uL (ref 150–400)
RBC: 3.03 MIL/uL — ABNORMAL LOW (ref 3.87–5.11)
RDW: 14 % (ref 11.5–15.5)
WBC: 9.4 10*3/uL (ref 4.0–10.5)
nRBC: 0.3 % — ABNORMAL HIGH (ref 0.0–0.2)

## 2023-03-14 LAB — PROTIME-INR
INR: 1.7 — ABNORMAL HIGH (ref 0.8–1.2)
Prothrombin Time: 20.3 s — ABNORMAL HIGH (ref 11.4–15.2)

## 2023-03-14 LAB — MAGNESIUM: Magnesium: 1.9 mg/dL (ref 1.7–2.4)

## 2023-03-14 MED ORDER — WARFARIN SODIUM 1 MG PO TABS
1.5000 mg | ORAL_TABLET | Freq: Once | ORAL | Status: AC
  Administered 2023-03-14: 1.5 mg via ORAL
  Filled 2023-03-14: qty 1

## 2023-03-14 MED ORDER — AMIODARONE HCL IN DEXTROSE 360-4.14 MG/200ML-% IV SOLN
60.0000 mg/h | INTRAVENOUS | Status: AC
  Administered 2023-03-14: 60 mg/h via INTRAVENOUS
  Filled 2023-03-14 (×2): qty 200

## 2023-03-14 MED ORDER — AMIODARONE LOAD VIA INFUSION
150.0000 mg | Freq: Once | INTRAVENOUS | Status: AC
  Administered 2023-03-14: 150 mg via INTRAVENOUS
  Filled 2023-03-14: qty 83.34

## 2023-03-14 MED ORDER — MILRINONE LACTATE IN DEXTROSE 20-5 MG/100ML-% IV SOLN
0.1250 ug/kg/min | INTRAVENOUS | Status: DC
  Administered 2023-03-14 – 2023-03-17 (×3): 0.125 ug/kg/min via INTRAVENOUS
  Filled 2023-03-14 (×3): qty 100

## 2023-03-14 MED ORDER — AMIODARONE HCL IN DEXTROSE 360-4.14 MG/200ML-% IV SOLN
30.0000 mg/h | INTRAVENOUS | Status: DC
  Administered 2023-03-14 – 2023-03-20 (×11): 30 mg/h via INTRAVENOUS
  Filled 2023-03-14 (×14): qty 200

## 2023-03-14 NOTE — Progress Notes (Addendum)
8 Days Post-Op Procedure(s) (LRB): REDO STERNOTOMY (N/A) MITRAL VALVE (MV) REPLACEMENT, USING 29 MM MEDTRONIC MOSAIC PORCINE HEART VALVE (N/A) TRANSESOPHAGEAL ECHOCARDIOGRAM (N/A) TRICUSPID VALVE REPAIR, USING 30 MM MC3 ANNULOPLASTY RING Subjective:  Had some nausea, but improved  Objective: Vital signs in last 24 hours: Temp:  [97.5 F (36.4 C)-98.3 F (36.8 C)] 97.9 F (36.6 C) (01/23 0655) Pulse Rate:  [76-119] 119 (01/23 0700) Cardiac Rhythm: Atrial fibrillation (01/23 0400) Resp:  [8-22] 22 (01/23 0700) BP: (87-137)/(51-92) 113/92 (01/23 0700) SpO2:  [89 %-100 %] 95 % (01/23 0700) Weight:  [62.3 kg] 62.3 kg (01/23 0500)  Hemodynamic parameters for last 24 hours: CVP:  [7 mmHg-12 mmHg] 9 mmHg  Intake/Output from previous day: 01/22 0701 - 01/23 0700 In: -  Out: 2342 [Urine:2342] Intake/Output this shift: Total I/O In: 120 [P.O.:120] Out: -   General appearance: alert, cooperative, and no distress Heart: irregularly irregular rhythm Lungs: coarse throughout Abdomen: benign Extremities: no edema Wound: incis healing well  Lab Results: Recent Labs    03/13/23 0419 03/14/23 0418  WBC 10.1 9.4  HGB 9.1* 9.2*  HCT 27.5* 28.5*  PLT 265 299   BMET:  Recent Labs    03/13/23 0419 03/14/23 0418  NA 136 130*  K 4.5 4.0  CL 98 90*  CO2 29 32  GLUCOSE 118* 115*  BUN 28* 27*  CREATININE 1.17* 0.94  CALCIUM 8.3* 7.8*    PT/INR:  Recent Labs    03/14/23 0418  LABPROT 20.3*  INR 1.7*   ABG    Component Value Date/Time   PHART 7.373 03/07/2023 0506   HCO3 22.2 03/07/2023 0506   TCO2 23 03/07/2023 0506   ACIDBASEDEF 3.0 (H) 03/07/2023 0506   O2SAT 48.5 03/14/2023 0707   CBG (last 3)  Recent Labs    03/13/23 1605 03/13/23 2139 03/14/23 0649  GLUCAP 146* 116* 114*    Meds Scheduled Meds:  amiodarone  150 mg Intravenous Once   arformoterol  15 mcg Nebulization BID   aspirin  81 mg Oral Daily   atorvastatin  80 mg Oral Daily   bisacodyl  10  mg Oral Daily   Or   bisacodyl  10 mg Rectal Daily   budesonide (PULMICORT) nebulizer solution  0.5 mg Nebulization BID   Chlorhexidine Gluconate Cloth  6 each Topical Daily   digoxin  0.125 mg Oral Daily   docusate sodium  200 mg Oral Daily   ezetimibe  10 mg Oral Daily   feeding supplement  237 mL Oral BID BM   furosemide  80 mg Intravenous Q12H   insulin aspart  0-15 Units Subcutaneous TID WC   mouth rinse  15 mL Mouth Rinse 4 times per day   pantoprazole  40 mg Oral Daily   polyethylene glycol  17 g Oral Daily   revefenacin  175 mcg Nebulization Daily   sodium chloride flush  10-40 mL Intracatheter Q12H   Warfarin - Pharmacist Dosing Inpatient   Does not apply q1600   Continuous Infusions:  albumin human     amiodarone 60 mg/hr (03/14/23 0801)   Followed by   amiodarone     milrinone     PRN Meds:.albumin human, albuterol, HYDROmorphone (DILAUDID) injection, lactulose, metoprolol tartrate, ondansetron, mouth rinse, oxyCODONE, sodium chloride flush, traMADol  Xrays DG CHEST PORT 1 VIEW Result Date: 03/13/2023 CLINICAL DATA:  PICC line placement EXAM: PORTABLE CHEST 1 VIEW COMPARISON:  03/12/2023 FINDINGS: Single frontal view of the chest demonstrates stable enlargement of the  cardiac silhouette. Interval right-sided PICC placement, tip overlying superior vena cava. There is persistent vascular congestion, with interstitial opacities and small bilateral effusions consistent with volume overload. No pneumothorax. No acute bony abnormalities. IMPRESSION: 1. Stable findings of congestive heart failure and mild edema. 2. Stable small bilateral pleural effusions. 3. PICC line tip projecting over superior vena cava. Electronically Signed   By: Sharlet Salina M.D.   On: 03/13/2023 18:45   [place] EKG SITE RITE Result Date: 03/13/2023 If [facility] image not attached, placement could not be confirmed due to current cardiac rhythm.  ECHOCARDIOGRAM COMPLETE Result Date: 03/12/2023     ECHOCARDIOGRAM REPORT   Patient Name:   OZELLA VANHOOZER Date of Exam: 03/12/2023 Medical Rec #:  [id number]    Height:       61.0 in Accession #:    [id number]   Weight:       140.9 lb Date of Birth:  August 13, 1945   BSA:          1.627 m Patient Age:    78 years     BP:           122/85 mmHg Patient Gender: F            HR:           124 bpm. Exam Location:  Inpatient Procedure: 2D Echo, Color Doppler, Cardiac Doppler and Intracardiac            Opacification Agent Indications:    Status Post MVR z95.2  History:        Patient has prior history of Echocardiogram examinations, most                 recent 03/06/2023. CHF, CAD, COPD; Risk Factors:Hypertension and                 Dyslipidemia.                  Mitral Valve: 29 mm Medtronic Mosaic Bioprosthesis valve is                 present in the mitral position. Procedure Date: 03/06/23.  Sonographer:    Irving Burton Senior RDCS Referring Phys: [id number] Eugenio Hoes IMPRESSIONS  1. Left ventricular ejection fraction, by estimation, is 25 to 30%. The left ventricle has severely decreased function. The left ventricle demonstrates global hypokinesis. There is mild concentric left ventricular hypertrophy. Left ventricular diastolic  function could not be evaluated.  2. Right ventricular systolic function is severely reduced. The right ventricular size is moderately enlarged. There is moderately elevated pulmonary artery systolic pressure. The estimated right ventricular systolic pressure is 48.6 mmHg.  3. Left atrial size was severely dilated.  4. Right atrial size was mildly dilated.  5. The mitral valve has been repaired/replaced. Trivial mitral valve regurgitation. No evidence of mitral stenosis. The mean mitral valve gradient is 3.7 mmHg with average heart rate of 120 bpm. There is a 29 mm Medtronic Mosaic Bioprosthesis present in  the mitral position. Procedure Date: 03/06/23. Echo findings are consistent with normal structure and function of the mitral valve prosthesis.  6. 30mm  MC3 Tricuspid ring implanted 03/06/23     . The tricuspid valve is has been repaired/replaced. The tricuspid valve is status post repair with an annuloplasty ring.  7. The aortic valve is tricuspid. Aortic valve regurgitation is trivial. Aortic valve sclerosis is present, with no evidence of aortic valve stenosis.  8. The inferior vena cava is dilated in  size with <50% respiratory variability, suggesting right atrial pressure of 15 mmHg. Comparison(s): Prior images reviewed side by side. The left ventricular function is significantly worse. The right ventricular systolic function is significantly worse. The mitral valve has been replaced with a bioprosthesis and a tricuspid valve annuloplasty has been performed. FINDINGS  Left Ventricle: There is no left ventricular thrombus (Definity contrast was used). Left ventricular ejection fraction, by estimation, is 25 to 30%. The left ventricle has severely decreased function. The left ventricle demonstrates global hypokinesis. Definity contrast agent was given IV to delineate the left ventricular endocardial borders. The left ventricular internal cavity size was normal in size. There is mild concentric left ventricular hypertrophy. Abnormal (paradoxical) septal motion consistent with post-operative status. Left ventricular diastolic function could not be evaluated due to mitral valve replacement. Left ventricular diastolic function could not be evaluated. Right Ventricle: The right ventricular size is moderately enlarged. No increase in right ventricular wall thickness. Right ventricular systolic function is severely reduced. There is moderately elevated pulmonary artery systolic pressure. The tricuspid regurgitant velocity is 2.90 m/s, and with an assumed right atrial pressure of 15 mmHg, the estimated right ventricular systolic pressure is 48.6 mmHg. Left Atrium: Left atrial size was severely dilated. Right Atrium: Right atrial size was mildly dilated. Pericardium: There  is no evidence of pericardial effusion. Mitral Valve: The mitral valve has been repaired/replaced. Trivial mitral valve regurgitation. There is a 29 mm Medtronic Mosaic Bioprosthesis present in the mitral position. Procedure Date: 03/06/23. Echo findings are consistent with normal structure and  function of the mitral valve prosthesis. No evidence of mitral valve stenosis. MV peak gradient, 10.2 mmHg. The mean mitral valve gradient is 3.7 mmHg with average heart rate of 120 bpm. Tricuspid Valve: 30mm MC3 Tricuspid ring implanted 03/06/23. The tricuspid valve is has been repaired/replaced. Tricuspid valve regurgitation is mild . No evidence of tricuspid stenosis. The tricuspid valve is status post repair with an annuloplasty ring. Aortic Valve: The aortic valve is tricuspid. Aortic valve regurgitation is trivial. Aortic valve sclerosis is present, with no evidence of aortic valve stenosis. Pulmonic Valve: The pulmonic valve was normal in structure. Pulmonic valve regurgitation is mild. Aorta: The aortic root and ascending aorta are structurally normal, with no evidence of dilitation. Venous: The inferior vena cava is dilated in size with less than 50% respiratory variability, suggesting right atrial pressure of 15 mmHg. IAS/Shunts: No atrial level shunt detected by color flow Doppler.  LEFT VENTRICLE PLAX 2D LVIDd:         4.00 cm LVIDs:         3.50 cm LV PW:         1.20 cm LV IVS:        1.30 cm LVOT diam:     1.80 cm LV SV:         33 LV SV Index:   20 LVOT Area:     2.54 cm  LV Volumes (MOD) LV vol d, MOD A2C: 102.0 ml LV vol d, MOD A4C: 113.0 ml LV vol s, MOD A2C: 75.5 ml LV vol s, MOD A4C: 84.7 ml LV SV MOD A2C:     26.5 ml LV SV MOD A4C:     113.0 ml LV SV MOD BP:      27.5 ml RIGHT VENTRICLE RV S prime:     2.94 cm/s LEFT ATRIUM              Index  RIGHT ATRIUM           Index LA diam:        4.90 cm  3.01 cm/m   RA Area:     19.30 cm LA Vol (A2C):   67.2 ml  41.29 ml/m  RA Volume:   60.80 ml  37.36  ml/m LA Vol (A4C):   104.0 ml 63.91 ml/m LA Biplane Vol: 85.3 ml  52.42 ml/m  AORTIC VALVE LVOT Vmax:   91.25 cm/s LVOT Vmean:  66.300 cm/s LVOT VTI:    0.130 m  AORTA Ao Root diam: 2.90 cm Ao Asc diam:  2.90 cm MITRAL VALVE             TRICUSPID VALVE MV Area VTI:  1.31 cm   TR Peak grad:   33.6 mmHg MV Peak grad: 10.2 mmHg  TR Vmax:        290.00 cm/s MV Mean grad: 3.7 mmHg MV Vmax:      1.60 m/s   SHUNTS MV Vmean:     86.6 cm/s  Systemic VTI:  0.13 m                          Systemic Diam: 1.80 cm Mihai Croitoru MD Electronically signed by Thurmon Fair MD Signature Date/Time: 03/12/2023/3:06:52 PM    Final     Assessment/Plan: S/P Procedure(s) (LRB): REDO STERNOTOMY (N/A) MITRAL VALVE (MV) REPLACEMENT, USING 29 MM MEDTRONIC MOSAIC PORCINE HEART VALVE (N/A) TRANSESOPHAGEAL ECHOCARDIOGRAM (N/A) TRICUSPID VALVE REPAIR, USING 30 MM MC3 ANNULOPLASTY RING POD#8  1 afeb, s BP 87-137, afib w/RVR, Co-ox 48.5- milrinone and IV amio added this am per AHF- eventual DCCV planned, also on digoxin 2 O2 sats ok on 4 liters, PCCM assisting w/ management, nebs 3 weight conts to trend lower, good UOP 4 BS well controlled 5 normal renal fxn 6 hyponatremia, sodium 130, may be able to decrease lasix dosing 7 INR 1.7 8 expected ABLA- stable  9 push rehab and pulm hygiene as able  LOS: 8 days    Rowe Clack PA-C Pager [phone]   03/14/2023   Agree with above Restarting milr On amio gtt Will keep in unit for now  [employer]

## 2023-03-14 NOTE — Progress Notes (Addendum)
PHARMACY - ANTICOAGULATION CONSULT NOTE  Pharmacy Consult for warfarin Indication: afib + bioprosthetic mitral valve  Allergies  Allergen Reactions   Codeine Nausea And Vomiting    Patient Measurements: Height: 5\' 1"  (154.9 cm) Weight: 62.3 kg (137 lb 5.6 oz) IBW/kg (Calculated) : 47.8  Vital Signs: Temp: 97.9 F (36.6 C) (01/23 0826) Temp Source: Axillary (01/23 0826) BP: 113/92 (01/23 0700) Pulse Rate: 119 (01/23 0700)  Labs: Recent Labs    03/12/23 0322 03/13/23 0419 03/14/23 0418  HGB 9.6* 9.1* 9.2*  HCT 28.6* 27.5* 28.5*  PLT 221 265 299  LABPROT 17.3* 19.0* 20.3*  INR 1.4* 1.6* 1.7*  CREATININE 1.21* 1.17* 0.94    Estimated Creatinine Clearance: 43.1 mL/min (by C-G formula based on SCr of 0.94 mg/dL).   Medical History: Past Medical History:  Diagnosis Date   CAD (coronary artery disease)    CHF (congestive heart failure) (HCC)    COPD (chronic obstructive pulmonary disease) (HCC)    CVA (cerebral vascular accident) (HCC)    Dysrhythmia    A.Fib   Heart murmur    History of kidney stones    HLD (hyperlipidemia)    Hypertension    MI (myocardial infarction) (HCC) 10/2020    Assessment: 78 yo female previously on apixaban for atrial fibrillation, pharmacy asked to switch to Coumadin for bioprosthetic mitral valve + afib starting 03/09/22.  INR is subtherapeutic at 1.7 (+ 0.1)  after warfarin 1.5 mg x1. CBC is stable with no signs of bleeding. Patient's appetite is still improving, and they are starting to eat more. Amiodarone was switched from PO to IV this morning, will monitor therapy with known DDI with warfarin.   Goal of Therapy:  INR 2-3 Monitor platelets by anticoagulation protocol: Yes   Plan:  Coumadin 1.5 mg po x 1 tonight Monitor daily INR, CBC, and signs of bleeding Monitor diet, amiodarone administration, and other potential DDI.  Wilmer Floor, PharmD PGY2 Cardiology Pharmacy Resident  03/14/2023 8:51 AM   [facility] pharmacy phone  numbers are listed on [email or web address]

## 2023-03-14 NOTE — Progress Notes (Addendum)
Advanced Heart Failure Rounding Note  HF Cardiologist: Dr. Shirlee Latch  Chief Complaint: SOB   Patient Profile  78 yo woman with PAF, COPD, CAD s/p previous CABG (LIMA) -> LIMA occluded. Patent LAD stents cath 2/24. 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    Underwent MV Replacement/TV Repair and LAA ligation on 03/06/23. Post op course c/b afib w/ RVR and biventricular failure R>L (drop in LVEF 40-45%>>25-30%, RV severely reduced).  Subjective:    PICC placed overnight. Initial co-ox 47%. Repeat 49%   Scr ok at 0.94. 2.3L in UOP yesterday. CVP 10-11 today.   Remains in Afib w/ RVR, 110s. SBPs low 100s-110s.  K 4.0 Mg 1.9    OOB sitting up in chair. Remains on 4L [place] but reports feeling much better w/ less dyspnea. Eating breakfast. Appetite is good. No constipation. Ambulated earlier this morning and says she did ok.    Objective:   Weight Range: 62.3 kg Body mass index is 25.95 kg/m.   Vital Signs:   Temp:  [97.4 F (36.3 C)-98.3 F (36.8 C)] 97.9 F (36.6 C) (01/23 0655) Pulse Rate:  [76-119] 119 (01/23 0700) Resp:  [8-22] 22 (01/23 0700) BP: (87-137)/(51-92) 113/92 (01/23 0700) SpO2:  [89 %-100 %] 95 % (01/23 0700) Weight:  [62.3 kg] 62.3 kg (01/23 0500) Last BM Date : 03/12/23  Weight change: Filed Weights   03/11/23 0500 03/13/23 0500 03/14/23 0500  Weight: 63.9 kg 63.4 kg 62.3 kg    Intake/Output:   Intake/Output Summary (Last 24 hours) at 03/14/2023 0711 Last data filed at 03/14/2023 0600 Gross per 24 hour  Intake --  Output 2342 ml  Net -2342 ml      Physical Exam   General:  Elderly, sitting up in chair. No respiratory difficulty HEENT: normal Neck: supple. JVD 10 cm. Carotids 2+ bilat; no bruits. No lymphadenopathy or thyromegaly appreciated. Cor: PMI nondisplaced. Irregularly irregular rhythm and rate. No rubs, gallops or murmurs. Lungs: decreased BS at the bass bilaterally  Abdomen: soft, nontender, nondistended. No hepatosplenomegaly. No bruits or masses.  Good bowel sounds. Extremities: no cyanosis, clubbing, rash, trace b/l LE edema, TED hoses + RUE PICC  Neuro: alert & oriented x 3, cranial nerves grossly intact. moves all 4 extremities w/o difficulty. Affect pleasant Hard of hearing   Telemetry   Afib 110, personally reviewed    EKG   No new EKG to review   Labs    CBC Recent Labs    03/13/23 0419 03/14/23 0418  WBC 10.1 9.4  HGB 9.1* 9.2*  HCT 27.5* 28.5*  MCV 92.9 94.1  PLT 265 299   Basic Metabolic Panel Recent Labs    44/01/02 0419 03/14/23 0418  NA 136 130*  K 4.5 4.0  CL 98 90*  CO2 29 32  GLUCOSE 118* 115*  BUN 28* 27*  CREATININE 1.17* 0.94  CALCIUM 8.3* 7.8*  MG 2.2 1.9   Liver Function Tests No results for input(s): "AST", "ALT", "ALKPHOS", "BILITOT", "PROT", "ALBUMIN" in the last 72 hours. No results for input(s): "LIPASE", "AMYLASE" in the last 72 hours. Cardiac Enzymes No results for input(s): "CKTOTAL", "CKMB", "CKMBINDEX", "TROPONINI" in the last 72 hours.  BNP: BNP (last 3 results) Recent Labs    06/18/22 1458 07/19/22 1227 02/05/23 1130  BNP 363.7* 256.9* 519.0*    ProBNP (last 3 results) Recent Labs    08/15/22 1209  PROBNP 435     D-Dimer No results for input(s): "DDIMER" in the last 72 hours. Hemoglobin  A1C No results for input(s): "HGBA1C" in the last 72 hours. Fasting Lipid Panel No results for input(s): "CHOL", "HDL", "LDLCALC", "TRIG", "CHOLHDL", "LDLDIRECT" in the last 72 hours. Thyroid Function Tests No results for input(s): "TSH", "T4TOTAL", "T3FREE", "THYROIDAB" in the last 72 hours.  Invalid input(s): "FREET3"  Other results:   Imaging    DG CHEST PORT 1 VIEW Result Date: 03/13/2023 CLINICAL DATA:  PICC line placement EXAM: PORTABLE CHEST 1 VIEW COMPARISON:  03/12/2023 FINDINGS: Single frontal view of the chest demonstrates stable enlargement of the cardiac silhouette. Interval right-sided PICC placement, tip overlying superior vena cava. There is  persistent vascular congestion, with interstitial opacities and small bilateral effusions consistent with volume overload. No pneumothorax. No acute bony abnormalities. IMPRESSION: 1. Stable findings of congestive heart failure and mild edema. 2. Stable small bilateral pleural effusions. 3. PICC line tip projecting over superior vena cava. Electronically Signed   By: Sharlet Salina M.D.   On: 03/13/2023 18:45   [place] EKG SITE RITE Result Date: 03/13/2023 If [facility] image not attached, placement could not be confirmed due to current cardiac rhythm.    Medications:     Scheduled Medications:  amiodarone  400 mg Oral BID   arformoterol  15 mcg Nebulization BID   aspirin  81 mg Oral Daily   atorvastatin  80 mg Oral Daily   bisacodyl  10 mg Oral Daily   Or   bisacodyl  10 mg Rectal Daily   budesonide (PULMICORT) nebulizer solution  0.5 mg Nebulization BID   Chlorhexidine Gluconate Cloth  6 each Topical Daily   digoxin  0.125 mg Oral Daily   docusate sodium  200 mg Oral Daily   ezetimibe  10 mg Oral Daily   feeding supplement  237 mL Oral BID BM   furosemide  80 mg Intravenous Q12H   insulin aspart  0-15 Units Subcutaneous TID WC   mouth rinse  15 mL Mouth Rinse 4 times per day   pantoprazole  40 mg Oral Daily   polyethylene glycol  17 g Oral Daily   revefenacin  175 mcg Nebulization Daily   sodium chloride flush  10-40 mL Intracatheter Q12H   Warfarin - Pharmacist Dosing Inpatient   Does not apply q1600    Infusions:  albumin human      PRN Medications: albumin human, albuterol, HYDROmorphone (DILAUDID) injection, lactulose, metoprolol tartrate, ondansetron, mouth rinse, oxyCODONE, sodium chloride flush, traMADol    Assessment/Plan   1. Severe MR/TR - 2/2 rheumatic heart disease  - S/P MV Replacement (bio) TVRepair, LAA - trivial MR on echo 1/21   2. PAF - Developed A fib in 2022. Had cardioversion 2023.  - Back in A fib w/ RVR post-op.  - on PO amio 400 mg twice daily.  Now w/ PICC, will transition to IV amio - On coumadin. INR 1.7 - will need DCCV when INR therapeutic (LAA ligated)    3. Acute Biventricular HFrEF w/ Low Output  - Intra-op TEE EF 40-45% - Post op Echo EF down to 25-30% with RV severely reduced.  - A fib RVR likely contributing.  - Co-ox low 49%, Start Milrinone 0.25 - CVP 10-11. Continue IV Lasix 80 mg x 1  - GDMT limited currently by soft BP    4. Acute Hypoxic Respiratory Failure - COPD at baseline. She does require oxygen at home. Oxygen requirements increased to 6 liters post op, likely triggered by acute CHF. Down to 4L today   - Continue diuresis  per above   5. Anemia, Expected Blood Loss - hgb stable, 9.2 today    Length of Stay: [address] PA-C  03/14/2023, 7:11 AM  Advanced Heart Failure Team Pager [phone] (M-F; 7a - 5p)  Please contact [facility] for night-coverage after hours (5p -7a ) and weekends on [email or web address]   Patient seen and examined with the above-signed Advanced Practice Provider and/or Housestaff. I personally reviewed laboratory data, imaging studies and relevant notes. I independently examined the patient and formulated the important aspects of the plan. I have edited the note to reflect any of my changes or salient points. I have personally discussed the plan with the patient and/or family.  Diuresed well overnight. Remains in AF with RVR now on IV amio  Co-ox 48%  General:  Weak appearing. No resp difficulty HEENT: normal Neck: supple. JVP 10 Carotids 2+ bilat; no bruits. No lymphadenopathy or thryomegaly appreciated. Cor: PMI nondisplaced. Irreg Lungs: clear Abdomen: soft, nontender, nondistended. No hepatosplenomegaly. No bruits or masses. Good bowel sounds. Extremities: no cyanosis, clubbing, rash, tr edema Neuro: alert & orientedx3, cranial nerves grossly intact. moves all 4 extremities w/o difficulty. Affect pleasant  Struggling with post-op HF (R>L).Symptomatically improved  with diuresis but co-ox still low. Agree with milrinone but would use 0.125. Plan DC-CV tomorrow if INR >= 2.0 (LAA ligated)  Arvilla Meres, MD  8:21 AM

## 2023-03-14 NOTE — Progress Notes (Signed)
      [address]       [place] [address]             [phone]      POD # 8 MVR, TV repair  BP (!) 98/56 (BP Location: Left Arm)   Pulse 83   Temp 97.9 F (36.6 C) (Oral)   Resp 19   Ht 5\' 1"  (1.549 m)   Wt 62.3 kg   SpO2 96%   BMI 25.95 kg/m  A fib rate controlled 4L [place] 95% sat CVP 6  Intake/Output Summary (Last 24 hours) at 03/14/2023 1826 Last data filed at 03/14/2023 1530 Gross per 24 hour  Intake 266.81 ml  Output 2700 ml  Net -2433.19 ml    Co-ox 60 On amiodarone drip  Viviann Spare C. Dorris Fetch, MD [facility] [phone]

## 2023-03-14 NOTE — Progress Notes (Signed)
Occupational Therapy Treatment Patient Details Name: Penny Mccullough MRN: [id number] DOB: 25-Oct-1945 Today's Date: 03/14/2023   History of present illness 78 y.o. female admitted 03/06/23 for planned MVR, tricuspid valve repair, redo sternotomy. PMH includes CAD s/p CABG (1989), afib on Eliquis, CHF, COPD, CVA, HTN, MI.   OT comments  OT session focused on training in techniques for increased safety and independence with ADLs and bed mobility and functional transfers during/in preparation for functional tasks, all while adhering to sternal precautions. Pt demonstrating overall good carryover of prior training in sternal precautions with pt requiring no cues to adhere to precautions during bed mobility, transfers, or LB ADLs and initially requiring occasional cues during UB ADLs fading to no cues needed. Pt currently demonstrates ability to complete UB ADLs Independent to Contact guard assist, LB ADLs with close Supervision to Contact guard assist, supine to sit transfer with close Supervision, and functional step-pivot transfers hand held assist +1 with Contact guard assist, all while adhering to sternal precautions. Pt with HR 100 to 117 bpm throughout session and O2 sat >/97% on 4L continuous O2 through nasal cannula throughout session. Pt participated well in session and is making good progress toward goals. Pt will benefit from continued acute skilled OT services to address deficits outline below and increase safety and independence with functional tasks. Post acute discharge, pt will benefit from continued skilled OT services in the home to maximize rehab potential.       If plan is discharge home, recommend the following:  A little help with walking and/or transfers;A little help with bathing/dressing/bathroom;Assistance with cooking/housework;Assist for transportation;Help with stairs or ramp for entrance   Equipment Recommendations  None recommended by OT (Pt encouraged to use existing 3-in-1 for  energy conservation upon returning home.)    Recommendations for Other Services      Precautions / Restrictions Precautions Precautions: Fall;Sternal Precaution Booklet Issued: No Precaution Comments: pt with good overall adherence to and recall of precautions from training in prior sessions; reviewed precautions throughout session Restrictions Weight Bearing Restrictions Per Provider Order: Yes RUE Weight Bearing Per Provider Order: Non weight bearing LUE Weight Bearing Per Provider Order: Non weight bearing Other Position/Activity Restrictions: sternal precautions       Mobility Bed Mobility Overal bed mobility: Needs Assistance Bed Mobility: Supine to Sit     Supine to sit: Supervision, HOB elevated (while hugging heart pillow)     General bed mobility comments: assist for managing lines    Transfers Overall transfer level: Needs assistance Equipment used: None Transfers: Sit to/from Stand, Bed to chair/wheelchair/BSC Sit to Stand: Supervision, Contact guard assist (Close Sueprvision to CGA; hugging heart pillow)     Step pivot transfers: Contact guard assist (hand held assist +1)     General transfer comment: no overt LOB, mild deficits noted in step-pivot transfer; close Supervision to CGA in STS and CGA in step-pivot transfer for safety     Balance Overall balance assessment: Mild deficits observed, not formally tested                                         ADL either performed or assessed with clinical judgement   ADL Overall ADL's : Needs assistance/impaired Eating/Feeding: Independent;Sitting   Grooming: Modified independent;Sitting;Contact guard assist;Standing   Upper Body Bathing: Set up;Sitting (occasional cues to adhere to sternal precautions) Upper Body Bathing Details (indicate cue type and reason):  simulated at recliner Lower Body Bathing: Supervison/ safety;Contact guard assist;Sitting/lateral leans;Sit to/from stand Lower  Body Bathing Details (indicate cue type and reason): simulated at recliner Upper Body Dressing : Contact guard assist;Sitting (occasional cues to adhere to sternal precautions)   Lower Body Dressing: Contact guard assist;Sitting/lateral leans;Sit to/from stand   Toilet Transfer: Contact guard assist;BSC/3in1 (step-pivot while hugging heart pillow) Toilet Transfer Details (indicate cue type and reason): 1 person HHA; simulated from bed to recliner           General ADL Comments: OT reviewed sternal precautions during ADLs with pt including techniques to "move in the tube" with pt verbalizing understanding and demonstrating good overall understanding through teach back with pt initially requiring occasional cues fading to no cues to maintain sternal precautions during UB tasks. Pt continues to present with decreased activity tolerance.    Extremity/Trunk Assessment Upper Extremity Assessment Upper Extremity Assessment: Right hand dominant;Generalized weakness (tested within sternal precautions)   Lower Extremity Assessment Lower Extremity Assessment: Defer to PT evaluation        Vision       Perception     Praxis      Cognition Arousal: Alert Behavior During Therapy: [facility] for tasks assessed/performed Overall Cognitive Status: Within Functional Limits for tasks assessed                                 General Comments: AAOx4 and pleasant throughout session. Pt requiring occasional verbal, visual, and tactile cues during session. However, this is appears to be due to pt being [facility] and not related to cognition.        Exercises      Shoulder Instructions       General Comments HR 100 to 117 bpm throughout session and O2 sat >/97% on 4L continuous O2 through nasal cannula throughout session.    Pertinent Vitals/ Pain       Pain Assessment Pain Assessment: Faces Faces Pain Scale: Hurts a little bit Pain Location: intermittent sternal pain and itching at  incision site Pain Descriptors / Indicators: Sore, Aching, Other (Comment) (Itching) Pain Intervention(s): Limited activity within patient's tolerance, Monitored during session, Repositioned, Other (comment) (RN notified)  Home Living                                          Prior Functioning/Environment              Frequency  Min 1X/week        Progress Toward Goals  OT Goals(current goals can now be found in the care plan section)  Progress towards OT goals: Progressing toward goals  Acute Rehab OT Goals Patient Stated Goal: to continue to feel better and to return home  Plan      Co-evaluation                 AM-PAC OT "6 Clicks" Daily Activity     Outcome Measure   Help from another person eating meals?: None Help from another person taking care of personal grooming?: A Little Help from another person toileting, which includes using toliet, bedpan, or urinal?: A Little Help from another person bathing (including washing, rinsing, drying)?: A Little Help from another person to put on and taking off regular upper body clothing?: A Little Help from another person to put  on and taking off regular lower body clothing?: A Little 6 Click Score: 19    End of Session Equipment Utilized During Treatment: Gait belt;Oxygen  OT Visit Diagnosis: Unsteadiness on feet (R26.81);Muscle weakness (generalized) (M62.81);Other (comment) (decreased activity tolerance)   Activity Tolerance Patient tolerated treatment well   Patient Left in chair;with call bell/phone within reach;with chair alarm set   Nurse Communication Mobility status;Other (comment) (vital signs)        Time: 4540-9811 OT Time Calculation (min): 23 min  Charges: OT General Charges $OT Visit: 1 Visit OT Treatments $Self Care/Home Management : 23-37 mins  Adriann Thau "Orson Eva., OTR/L, MA Acute Rehab [phone]   Lendon Colonel 03/14/2023, 2:32 PM

## 2023-03-14 NOTE — Progress Notes (Signed)
   NAME:  Penny Mccullough, MRN:  [id number], DOB:  1945/04/01, LOS: 8 ADMISSION DATE:  03/06/2023, CONSULTATION DATE: 03/06/2023 REFERRING MD: Eugenio Hoes, CHIEF COMPLAINT: Status post mitral valve replacement/tricuspid valve repair  History of Present Illness:  78 year old female with COPD, ex-smoker coronary artery disease status post CABG and MI status post LAD stent, paroxysmal A-fib on Eliquis and amiodarone and rheumatic mitral valve disease who underwent workup for exertional dyspnea, noted to have severe mitral regurgitation and tricuspid regurgitation, today she underwent mitral valve replacement and tricuspid valve repair.  She was transferred to ICU postop, PCCM was consulted for help evaluation medical management  Pertinent  Medical History   Past Medical History:  Diagnosis Date   CAD (coronary artery disease)    CHF (congestive heart failure) (HCC)    COPD (chronic obstructive pulmonary disease) (HCC)    CVA (cerebral vascular accident) (HCC)    Dysrhythmia    A.Fib   Heart murmur    History of kidney stones    HLD (hyperlipidemia)    Hypertension    MI (myocardial infarction) (HCC) 10/2020     Significant Hospital Events: Including procedures, antibiotic start and stop dates in addition to other pertinent events     Interim History / Subjective:  Feeling better. Remains in afib. Eating breakfast.   Objective   Blood pressure (!) 113/92, pulse (!) 119, temperature 97.9 F (36.6 C), temperature source Oral, resp. rate (!) 22, height 5\' 1"  (1.549 m), weight 62.3 kg, SpO2 95%. 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

## 2023-03-15 DIAGNOSIS — I5023 Acute on chronic systolic (congestive) heart failure: Secondary | ICD-10-CM

## 2023-03-15 DIAGNOSIS — J9601 Acute respiratory failure with hypoxia: Secondary | ICD-10-CM | POA: Diagnosis not present

## 2023-03-15 DIAGNOSIS — R570 Cardiogenic shock: Secondary | ICD-10-CM | POA: Diagnosis not present

## 2023-03-15 DIAGNOSIS — H9190 Unspecified hearing loss, unspecified ear: Secondary | ICD-10-CM

## 2023-03-15 DIAGNOSIS — I340 Nonrheumatic mitral (valve) insufficiency: Secondary | ICD-10-CM | POA: Diagnosis not present

## 2023-03-15 DIAGNOSIS — Z952 Presence of prosthetic heart valve: Secondary | ICD-10-CM | POA: Diagnosis not present

## 2023-03-15 DIAGNOSIS — I480 Paroxysmal atrial fibrillation: Secondary | ICD-10-CM | POA: Diagnosis not present

## 2023-03-15 LAB — CBC
HCT: 25.8 % — ABNORMAL LOW (ref 36.0–46.0)
Hemoglobin: 8.5 g/dL — ABNORMAL LOW (ref 12.0–15.0)
MCH: 30.9 pg (ref 26.0–34.0)
MCHC: 32.9 g/dL (ref 30.0–36.0)
MCV: 93.8 fL (ref 80.0–100.0)
Platelets: 318 10*3/uL (ref 150–400)
RBC: 2.75 MIL/uL — ABNORMAL LOW (ref 3.87–5.11)
RDW: 14.5 % (ref 11.5–15.5)
WBC: 8.2 10*3/uL (ref 4.0–10.5)
nRBC: 0.2 % (ref 0.0–0.2)

## 2023-03-15 LAB — DIGOXIN LEVEL: Digoxin Level: 1.2 ng/mL (ref 0.8–2.0)

## 2023-03-15 LAB — COOXEMETRY PANEL
Carboxyhemoglobin: 1.7 % — ABNORMAL HIGH (ref 0.5–1.5)
Methemoglobin: <0.7 % (ref 0.0–1.5)
O2 Saturation: 72.5 %
Total hemoglobin: 8.8 g/dL — ABNORMAL LOW (ref 12.0–16.0)

## 2023-03-15 LAB — BASIC METABOLIC PANEL
Anion gap: 12 (ref 5–15)
Anion gap: 14 (ref 5–15)
BUN: 20 mg/dL (ref 8–23)
BUN: 23 mg/dL (ref 8–23)
CO2: 34 mmol/L — ABNORMAL HIGH (ref 22–32)
CO2: 30 mmol/L (ref 22–32)
Calcium: 7.8 mg/dL — ABNORMAL LOW (ref 8.9–10.3)
Calcium: 8 mg/dL — ABNORMAL LOW (ref 8.9–10.3)
Chloride: 89 mmol/L — ABNORMAL LOW (ref 98–111)
Chloride: 89 mmol/L — ABNORMAL LOW (ref 98–111)
Creatinine, Ser: 0.91 mg/dL (ref 0.44–1.00)
Creatinine, Ser: 0.99 mg/dL (ref 0.44–1.00)
GFR, Estimated: >60 mL/min (ref >60)
GFR, Estimated: 59 mL/min — ABNORMAL LOW (ref >60)
Glucose, Bld: 147 mg/dL — ABNORMAL HIGH (ref 70–99)
Glucose, Bld: 165 mg/dL — ABNORMAL HIGH (ref 70–99)
Potassium: 3.2 mmol/L — ABNORMAL LOW (ref 3.5–5.1)
Potassium: 4.3 mmol/L (ref 3.5–5.1)
Sodium: 135 mmol/L (ref 135–145)
Sodium: 133 mmol/L — ABNORMAL LOW (ref 135–145)

## 2023-03-15 LAB — MAGNESIUM: Magnesium: 1.7 mg/dL (ref 1.7–2.4)

## 2023-03-15 LAB — GLUCOSE, CAPILLARY
Glucose-Capillary: 110 mg/dL — ABNORMAL HIGH (ref 70–99)
Glucose-Capillary: 123 mg/dL — ABNORMAL HIGH (ref 70–99)
Glucose-Capillary: 109 mg/dL — ABNORMAL HIGH (ref 70–99)
Glucose-Capillary: 119 mg/dL — ABNORMAL HIGH (ref 70–99)

## 2023-03-15 LAB — PROTIME-INR
INR: 1.7 — ABNORMAL HIGH (ref 0.8–1.2)
Prothrombin Time: 20 s — ABNORMAL HIGH (ref 11.4–15.2)

## 2023-03-15 MED ORDER — MAGNESIUM SULFATE 2 GM/50ML IV SOLN
2.0000 g | Freq: Once | INTRAVENOUS | Status: AC
  Administered 2023-03-15: 2 g via INTRAVENOUS
  Filled 2023-03-15: qty 50

## 2023-03-15 MED ORDER — WARFARIN SODIUM 2.5 MG PO TABS
2.5000 mg | ORAL_TABLET | Freq: Once | ORAL | Status: AC
  Administered 2023-03-15: 2.5 mg via ORAL
  Filled 2023-03-15: qty 1

## 2023-03-15 MED ORDER — OXYCODONE HCL 5 MG PO TABS
5.0000 mg | ORAL_TABLET | ORAL | Status: DC | PRN
Start: 2023-03-15 — End: 2023-03-22
  Administered 2023-03-16 – 2023-03-20 (×7): 5 mg via ORAL
  Filled 2023-03-15 (×7): qty 1

## 2023-03-15 MED ORDER — POTASSIUM CHLORIDE 20 MEQ PO PACK
60.0000 meq | PACK | Freq: Once | ORAL | Status: AC
  Administered 2023-03-15: 60 meq via ORAL
  Filled 2023-03-15: qty 3

## 2023-03-15 MED ORDER — POTASSIUM CHLORIDE 10 MEQ/100ML IV SOLN
10.0000 meq | INTRAVENOUS | Status: AC
  Administered 2023-03-15 (×2): 10 meq via INTRAVENOUS
  Filled 2023-03-15 (×2): qty 100

## 2023-03-15 NOTE — Progress Notes (Addendum)
Advanced Heart Failure Rounding Note  HF Cardiologist: Dr. Shirlee Latch  Chief Complaint: SOB   Patient Profile  78 yo woman with PAF, COPD, CAD s/p previous CABG (LIMA) -> LIMA occluded. Patent LAD stents cath 2/24. 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    Underwent MV Replacement/TV Repair and LAA ligation on 03/06/23. Post op course c/b afib w/ RVR and biventricular failure R>L (drop in LVEF 40-45%>>25-30%, RV severely reduced).  Subjective:    1/23 started on milrinone for low co-ox (47%)  On Milrinone 0.125, co-ox improved 73%  Continues in Afib, though rates better on IV amio @ 30/hr. V-rates low 100s. INR subtherapeutic at 1.7  CVP 10-11. SCr stable 0.91. K 3.2  OOB, sitting up in chair. Feels ok. No resting dyspnea. No chest pain. Appetite is good. Eating breakfast.      Objective:   Weight Range: 61.3 kg Body mass index is 25.53 kg/m.   Vital Signs:   Temp:  [97.8 F (36.6 C)-98.1 F (36.7 C)] 97.8 F (36.6 C) (01/24 0815) Pulse Rate:  [70-109] 80 (01/24 0800) Resp:  [10-24] 16 (01/24 0800) BP: (85-128)/(45-102) 98/70 (01/24 0800) SpO2:  [80 %-99 %] 94 % (01/24 0800) Weight:  [61.3 kg] 61.3 kg (01/24 0500) Last BM Date : 03/12/23  Weight change: Filed Weights   03/13/23 0500 03/14/23 0500 03/15/23 0500  Weight: 63.4 kg 62.3 kg 61.3 kg    Intake/Output:   Intake/Output Summary (Last 24 hours) at 03/15/2023 1040 Last data filed at 03/15/2023 0700 Gross per 24 hour  Intake 665.28 ml  Output 2575 ml  Net -1909.72 ml      Physical Exam   CVP 10-11  General:  Well appearing, elderly F, up in chair. No respiratory difficulty HEENT: normal Neck: supple. JVD 10 cm Carotids 2+ bilat; no bruits. No lymphadenopathy or thyromegaly appreciated. Cor: PMI nondisplaced. Irregularly irregularly rate & rhythm. No rubs, gallops or murmurs. Lungs: decreased BS at the bases bilaterally  Abdomen: soft, nontender, nondistended. No hepatosplenomegaly. No bruits or masses. Good  bowel sounds. Extremities: no cyanosis, clubbing, rash, 1+ b/l LE edema Neuro: alert & oriented x 3, cranial nerves grossly intact. moves all 4 extremities w/o difficulty. Affect pleasant.   Telemetry   Afib 90s-low 100s personally reviewed    EKG   No new EKG to review   Labs    CBC Recent Labs    03/14/23 0418 03/15/23 0454  WBC 9.4 8.2  HGB 9.2* 8.5*  HCT 28.5* 25.8*  MCV 94.1 93.8  PLT 299 318   Basic Metabolic Panel Recent Labs    16/10/96 0418 03/15/23 0457  NA 130* 135  K 4.0 3.2*  CL 90* 89*  CO2 32 34*  GLUCOSE 115* 147*  BUN 27* 20  CREATININE 0.94 0.91  CALCIUM 7.8* 7.8*  MG 1.9 1.7   Liver Function Tests No results for input(s): "AST", "ALT", "ALKPHOS", "BILITOT", "PROT", "ALBUMIN" in the last 72 hours. No results for input(s): "LIPASE", "AMYLASE" in the last 72 hours. Cardiac Enzymes No results for input(s): "CKTOTAL", "CKMB", "CKMBINDEX", "TROPONINI" in the last 72 hours.  BNP: BNP (last 3 results) Recent Labs    06/18/22 1458 07/19/22 1227 02/05/23 1130  BNP 363.7* 256.9* 519.0*    ProBNP (last 3 results) Recent Labs    08/15/22 1209  PROBNP 435     D-Dimer No results for input(s): "DDIMER" in the last 72 hours. Hemoglobin A1C No results for input(s): "HGBA1C" in the last 72 hours. Fasting  Lipid Panel No results for input(s): "CHOL", "HDL", "LDLCALC", "TRIG", "CHOLHDL", "LDLDIRECT" in the last 72 hours. Thyroid Function Tests No results for input(s): "TSH", "T4TOTAL", "T3FREE", "THYROIDAB" in the last 72 hours.  Invalid input(s): "FREET3"  Other results:   Imaging    No results found.    Medications:     Scheduled Medications:  arformoterol  15 mcg Nebulization BID   aspirin  81 mg Oral Daily   atorvastatin  80 mg Oral Daily   bisacodyl  10 mg Oral Daily   Or   bisacodyl  10 mg Rectal Daily   budesonide (PULMICORT) nebulizer solution  0.5 mg Nebulization BID   Chlorhexidine Gluconate Cloth  6 each Topical  Daily   docusate sodium  200 mg Oral Daily   ezetimibe  10 mg Oral Daily   feeding supplement  237 mL Oral BID BM   furosemide  80 mg Intravenous Q12H   insulin aspart  0-15 Units Subcutaneous TID WC   mouth rinse  15 mL Mouth Rinse 4 times per day   pantoprazole  40 mg Oral Daily   polyethylene glycol  17 g Oral Daily   revefenacin  175 mcg Nebulization Daily   sodium chloride flush  10-40 mL Intracatheter Q12H   warfarin  2.5 mg Oral ONCE-1600   Warfarin - Pharmacist Dosing Inpatient   Does not apply q1600    Infusions:  albumin human     amiodarone 30 mg/hr (03/15/23 0400)   magnesium sulfate bolus IVPB 2 g (03/15/23 1001)   milrinone 0.125 mcg/kg/min (03/15/23 0400)    PRN Medications: albumin human, albuterol, HYDROmorphone (DILAUDID) injection, lactulose, ondansetron, mouth rinse, oxyCODONE, sodium chloride flush, traMADol    Assessment/Plan   1. Severe MR/TR - 2/2 rheumatic heart disease  - S/P MV Replacement (bio) TV Repair, LAA - trivial MR on echo 1/21   2. PAF - Developed A fib in 2022. Had cardioversion 2023.  - Back in A fib w/ RVR post-op.  - continue amio gtt at 30/hr  - On coumadin. INR 1.7 - will need DCCV when INR therapeutic (LAA ligated)    3. Acute Biventricular HFrEF w/ Low Output  - Intra-op TEE EF 40-45% - Post op Echo EF down to 25-30% with RV severely reduced.  - A fib RVR likely contributing.  - Initial Co-ox low 49%, Started Milrinone 0.125 - Co-ox improved today at 73%, continue milrinone at 0.125 - CVP 10-11. Continue IV Lasix 80 mg x 1  - GDMT limited currently by soft BP  - plan DCCV once INR therapeutic    4. Acute Hypoxic Respiratory Failure - COPD at baseline. She does require oxygen at home. Oxygen requirements increased to 6 liters post op, likely triggered by acute CHF. Down to 4L today   - Continue diuresis per above   5. Anemia, Expected Blood Loss - hgb stable, 8.5 today   6. Hypokalemia/Hypomagnesemia  - K 3.2/Mg 1.  7 - give K and Mg supp w/ IV Lasix - repeat BMP in PM    Length of Stay: [address] PA-C  03/15/2023, 10:40 AM  Advanced Heart Failure Team Pager [phone] (M-F; 7a - 5p)  Please contact [facility] for night-coverage after hours (5p -7a ) and weekends on [email or web address]   Patient seen and examined with the above-signed Advanced Practice Provider and/or Housestaff. I personally reviewed laboratory data, imaging studies and relevant notes. I independently examined the patient and formulated the important aspects of the plan.  I have edited the note to reflect any of my changes or salient points. I have personally discussed the plan with the patient and/or family.  Remains on milrinone 0.125, IV amio.   Feeling better. Coox 73% CVP 10-11   Denias CP or SOB. Remains in AF with RVR despite IV amio.  INR 1.7  General:  Sitting up in bed  No resp difficulty HEENT: normal Neck: supple. JVP to jaw. Carotids 2+ bilat; no bruits. No lymphadenopathy or thryomegaly appreciated. Cor: PMI nondisplaced. Irreg tachy  Lungs: decreased Abdomen: soft, nontender, nondistended. No hepatosplenomegaly. No bruits or masses. Good bowel sounds. Extremities: no cyanosis, clubbing, rash, edema Neuro: alert & orientedx3, cranial nerves grossly intact. moves all 4 extremities w/o difficulty. Affect pleasant  Had planned for DC-CV today but INR subtherapeutic. Will cancel. Discussed warfarin dosing with PharmD personally.  Continue milrinone. Continue diuresis. Mobilize   Potentially start to wean milrinone tomorrow.   Arvilla Meres, MD  11:37 PM

## 2023-03-15 NOTE — Progress Notes (Signed)
PHARMACY - ANTICOAGULATION CONSULT NOTE  Pharmacy Consult for warfarin Indication: afib + bioprosthetic mitral valve  Allergies  Allergen Reactions   Codeine Nausea And Vomiting    Patient Measurements: Height: 5\' 1"  (154.9 cm) Weight: 61.3 kg (135 lb 2.3 oz) IBW/kg (Calculated) : 47.8  Vital Signs: Temp: 97.8 F (36.6 C) (01/24 0815) Temp Source: Oral (01/24 0815) BP: 98/70 (01/24 0800) Pulse Rate: 80 (01/24 0800)  Labs: Recent Labs    03/13/23 0419 03/14/23 0418 03/15/23 0454 03/15/23 0457  HGB 9.1* 9.2* 8.5*  --   HCT 27.5* 28.5* 25.8*  --   PLT 265 299 318  --   LABPROT 19.0* 20.3* 20.0*  --   INR 1.6* 1.7* 1.7*  --   CREATININE 1.17* 0.94  --  0.91    Estimated Creatinine Clearance: 44.2 mL/min (by C-G formula based on SCr of 0.91 mg/dL).   Medical History: Past Medical History:  Diagnosis Date   CAD (coronary artery disease)    CHF (congestive heart failure) (HCC)    COPD (chronic obstructive pulmonary disease) (HCC)    CVA (cerebral vascular accident) (HCC)    Dysrhythmia    A.Fib   Heart murmur    History of kidney stones    HLD (hyperlipidemia)    Hypertension    MI (myocardial infarction) (HCC) 10/2020    Assessment: 78 yo female previously on apixaban for atrial fibrillation, pharmacy asked to switch to Coumadin for bioprosthetic mitral valve + afib starting 03/09/22.  INR is subtherapeutic and unchanged at 1.7 (+/- 0)  after warfarin 1.5 mg x1. CBC is stable with no signs of bleeding. Patient's appetite is still improving. Amiodarone was switched from PO to IV on 1/23, will monitor therapy with known DDI with warfarin.   Goal of Therapy:  INR 2-3 Monitor platelets by anticoagulation protocol: Yes   Plan:  Coumadin 2.5 mg po x 1 tonight Monitor daily INR, CBC, and signs of bleeding Monitor diet, amiodarone administration, and other potential DDI.  Wilmer Floor, PharmD PGY2 Cardiology Pharmacy Resident  03/15/2023 8:44 AM   [facility]  pharmacy phone numbers are listed on [email or web address]

## 2023-03-15 NOTE — Progress Notes (Addendum)
9 Days Post-Op Procedure(s) (LRB): REDO STERNOTOMY (N/A) MITRAL VALVE (MV) REPLACEMENT, USING 29 MM MEDTRONIC MOSAIC PORCINE HEART VALVE (N/A) TRANSESOPHAGEAL ECHOCARDIOGRAM (N/A) TRICUSPID VALVE REPAIR, USING 30 MM MC3 ANNULOPLASTY RING Subjective:  +Sternotomy pain- nausea resolved  Objective: Vital signs in last 24 hours: Temp:  [97.9 F (36.6 C)-98.1 F (36.7 C)] 97.9 F (36.6 C) (01/24 0640) Pulse Rate:  [70-109] 77 (01/24 0700) Cardiac Rhythm: Atrial fibrillation (01/24 0000) Resp:  [10-24] 17 (01/24 0700) BP: (85-128)/(45-102) 95/54 (01/24 0700) SpO2:  [80 %-99 %] 95 % (01/24 0700) Weight:  [61.3 kg] 61.3 kg (01/24 0500)  Hemodynamic parameters for last 24 hours: CVP:  [1 mmHg-15 mmHg] 7 mmHg  Intake/Output from previous day: 01/23 0701 - 01/24 0700 In: 932.1 [P.O.:360; I.V.:572.1] Out: 2875 [Urine:2875] Intake/Output this shift: No intake/output data recorded.  General appearance: alert, cooperative, and no distress Heart: regular rate and rhythm Lungs: clear to auscultation bilaterally Abdomen: benign Extremities: no edema Wound: incis healing well  Lab Results: Recent Labs    03/14/23 0418 03/15/23 0454  WBC 9.4 8.2  HGB 9.2* 8.5*  HCT 28.5* 25.8*  PLT 299 318   BMET:  Recent Labs    03/14/23 0418 03/15/23 0457  NA 130* 135  K 4.0 3.2*  CL 90* 89*  CO2 32 34*  GLUCOSE 115* 147*  BUN 27* 20  CREATININE 0.94 0.91  CALCIUM 7.8* 7.8*    PT/INR:  Recent Labs    03/15/23 0454  LABPROT 20.0*  INR 1.7*   ABG    Component Value Date/Time   PHART 7.373 03/07/2023 0506   HCO3 22.2 03/07/2023 0506   TCO2 23 03/07/2023 0506   ACIDBASEDEF 3.0 (H) 03/07/2023 0506   O2SAT 72.5 03/15/2023 0454   CBG (last 3)  Recent Labs    03/14/23 1600 03/14/23 2156 03/15/23 0638  GLUCAP 117* 121* 110*    Meds Scheduled Meds:  arformoterol  15 mcg Nebulization BID   aspirin  81 mg Oral Daily   atorvastatin  80 mg Oral Daily   bisacodyl  10 mg Oral  Daily   Or   bisacodyl  10 mg Rectal Daily   budesonide (PULMICORT) nebulizer solution  0.5 mg Nebulization BID   Chlorhexidine Gluconate Cloth  6 each Topical Daily   digoxin  0.125 mg Oral Daily   docusate sodium  200 mg Oral Daily   ezetimibe  10 mg Oral Daily   feeding supplement  237 mL Oral BID BM   furosemide  80 mg Intravenous Q12H   insulin aspart  0-15 Units Subcutaneous TID WC   mouth rinse  15 mL Mouth Rinse 4 times per day   pantoprazole  40 mg Oral Daily   polyethylene glycol  17 g Oral Daily   potassium chloride  60 mEq Oral Once   revefenacin  175 mcg Nebulization Daily   sodium chloride flush  10-40 mL Intracatheter Q12H   Warfarin - Pharmacist Dosing Inpatient   Does not apply q1600   Continuous Infusions:  albumin human     amiodarone 30 mg/hr (03/15/23 0400)   milrinone 0.125 mcg/kg/min (03/15/23 0400)   potassium chloride     PRN Meds:.albumin human, albuterol, HYDROmorphone (DILAUDID) injection, lactulose, metoprolol tartrate, ondansetron, mouth rinse, oxyCODONE, sodium chloride flush, traMADol  Xrays DG CHEST PORT 1 VIEW Result Date: 03/13/2023 CLINICAL DATA:  PICC line placement EXAM: PORTABLE CHEST 1 VIEW COMPARISON:  03/12/2023 FINDINGS: Single frontal view of the chest demonstrates stable enlargement of the cardiac  silhouette. Interval right-sided PICC placement, tip overlying superior vena cava. There is persistent vascular congestion, with interstitial opacities and small bilateral effusions consistent with volume overload. No pneumothorax. No acute bony abnormalities. IMPRESSION: 1. Stable findings of congestive heart failure and mild edema. 2. Stable small bilateral pleural effusions. 3. PICC line tip projecting over superior vena cava. Electronically Signed   By: Sharlet Salina M.D.   On: 03/13/2023 18:45   [place] EKG SITE RITE Result Date: 03/13/2023 If [facility] image not attached, placement could not be confirmed due to current cardiac  rhythm.   Assessment/Plan: S/P Procedure(s) (LRB): REDO STERNOTOMY (N/A) MITRAL VALVE (MV) REPLACEMENT, USING 29 MM MEDTRONIC MOSAIC PORCINE HEART VALVE (N/A) TRANSESOPHAGEAL ECHOCARDIOGRAM (N/A) TRICUSPID VALVE REPAIR, USING 30 MM MC3 ANNULOPLASTY RING  1 afeb, afib, BP 80's-120's , gtts amio and milrinone, EKG w/ some ST depression- anterior- previous cabg and LAD stent, DCCV at some point is likely 2 sats ok on 4 liters 3 good UOP- AHF assisting w/ diuretic management 4 BS well controlled 5 Co-Ox 72 6 K+ 3.2- being replaced 7 H/H trended down a little today from previous- H/H 8.5/25.8 9 INR 1.7- coumadin per pharm dosing 10 cont pulm hygiene and rehab  LOS: 9 days    Rowe Clack PA-C Pager [phone] 03/15/2023   Agree with above Remains on milr and amio Continue ICU care  Ellary Casamento O Zaydee Aina

## 2023-03-15 NOTE — Progress Notes (Signed)
   NAME:  Penny Mccullough, MRN:  [id number], DOB:  06/04/1945, LOS: 9 ADMISSION DATE:  03/06/2023, CONSULTATION DATE: 03/06/2023 REFERRING MD: Eugenio Hoes, CHIEF COMPLAINT: Status post mitral valve replacement/tricuspid valve repair  History of Present Illness:  78 year old female with COPD, ex-smoker coronary artery disease status post CABG and MI status post LAD stent, paroxysmal A-fib on Eliquis and amiodarone and rheumatic mitral valve disease who underwent workup for exertional dyspnea, noted to have severe mitral regurgitation and tricuspid regurgitation, today she underwent mitral valve replacement and tricuspid valve repair.  She was transferred to ICU postop, PCCM was consulted for help evaluation medical management  Pertinent  Medical History   Past Medical History:  Diagnosis Date   CAD (coronary artery disease)    CHF (congestive heart failure) (HCC)    COPD (chronic obstructive pulmonary disease) (HCC)    CVA (cerebral vascular accident) (HCC)    Dysrhythmia    A.Fib   Heart murmur    History of kidney stones    HLD (hyperlipidemia)    Hypertension    MI (myocardial infarction) (HCC) 10/2020     Significant Hospital Events: Including procedures, antibiotic start and stop dates in addition to other pertinent events   1/16 MVR (bioprosthetic), TV repair  Interim History / Subjective:  She denies complaints.  Remains on milrinone and amiodarone.    Objective   Blood pressure (!) 95/54, pulse 77, temperature 97.9 F (36.6 C), temperature source Oral, resp. rate 17, height 5\' 1"  (1.549 m), weight 61.3 kg, SpO2 95%. CVP:  [1 mmHg-15 mmHg] 7 mmHg      Intake/Output Summary (Last 24 hours) at 03/15/2023 0734 Last data filed at 03/15/2023 0700 Gross per 24 hour  Intake 932.09 ml  Output 2875 ml  Net -1942.91 ml   Filed Weights   03/13/23 0500 03/14/23 0500 03/15/23 0500  Weight: 63.4 kg 62.3 kg 61.3 kg    Examination: Elderly woman lying in bed no acute  distress Hard of hearing, able to answer questions appropriately.  Moving all extremities. [place]/AT, eyes anicteric Breathing comfortably on nasal cannula.  Reduced basilar breath sounds. S1-S2, regular rate and irregular rhythm Abdomen soft, nontender Lower extremity edema  Coox 73% Cr 0.91 INR 1.7 H/H 8.5/25.8 Platelets 318  EKG: Afib, rate controlled, septal ST depressions. Qtc 548.   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  Dosing per pharmacy. - Continue milrinone, serial cooximetery -Continue amiodarone.  Eventually needs cardioversion but INR needs to be subtherapeutic.  HLD -Statin, Zetia  Acute respiratory failure with hypoxia, postoperative pulmonary edema- related to cardioplegia/stunning and afib COPD- s/p tx for presumed AECOPD -Continue bronchodilators - Continue Lasix every 12 hours  Hard of hearing -PT, OT - Delirium precautions  Hypokalemia - Repleted  Deconditioning - Continue ambulation, progressing mobility.  Steffanie Dunn, DO 03/15/23 9:06 AM [facility]  For contact information, see Amion. If no response to pager, please call PCCM consult pager. After hours, 7PM- 7AM, please call Elink.

## 2023-03-15 NOTE — Progress Notes (Signed)
Physical Therapy Treatment Patient Details Name: Tunya Held MRN: [id number] DOB: 01/12/1946 Today's Date: 03/15/2023   History of Present Illness 78 y.o. female admitted 03/06/23 for planned MVR, tricuspid valve repair, redo sternotomy. PMH includes CAD s/p CABG (1989), afib on Eliquis, CHF, COPD, CVA, HTN, MI.    PT Comments  Pt continues to make good progress towards goals. Pt was supervision for bed mobility, supervision to CGA for transfers/sit to stand and supervision for gait with RW this session. Pt was able to re-call precautions and state safe techniques for stair navigation at home. Due to pt current functional status, home set up and available assistance at home recommending skilled physical therapy services 3x/week in order to address strength, balance and functional mobility to decrease risk for falls, injury and re-hospitalization.      If plan is discharge home, recommend the following: A little help with walking and/or transfers;Assistance with cooking/housework;Assist for transportation;Help with stairs or ramp for entrance     Equipment Recommendations  Rollator (4 wheels)       Precautions / Restrictions Precautions Precautions: Fall;Sternal Precaution Booklet Issued: No Precaution Comments: pt with good overall adherence to and recall of precautions from training in prior sessions; reviewed precautions throughout session Restrictions Weight Bearing Restrictions Per Provider Order: Yes RUE Weight Bearing Per Provider Order: Non weight bearing LUE Weight Bearing Per Provider Order: Non weight bearing Other Position/Activity Restrictions: sternal precautions     Mobility  Bed Mobility Overal bed mobility: Needs Assistance Bed Mobility: Supine to Sit     Supine to sit: Supervision, HOB elevated     General bed mobility comments: assist for managing lines    Transfers Overall transfer level: Needs assistance Equipment used: None Transfers: Sit to/from  Stand, Bed to chair/wheelchair/BSC Sit to Stand: Supervision, Contact guard assist   Step pivot transfers: Contact guard assist       General transfer comment: no overt LOB,close Supervision to CGA in STS and CGA in step-pivot transfer for safety    Ambulation/Gait Ambulation/Gait assistance: Supervision Gait Distance (Feet): 200 Feet Assistive device: Rolling walker (2 wheels) Gait Pattern/deviations: Step-through pattern, Decreased stride length Gait velocity: improved velocity this session Gait velocity interpretation: 1.31 - 2.62 ft/sec, indicative of limited community ambulator   General Gait Details: Good AD management throughout.   Stairs Stairs:  (discussed navigating stairs at home. Grandaughters are going to assist and pt will use railing for light balance taking one step at a time.)              Balance Overall balance assessment: Mild deficits observed, not formally tested          Cognition Arousal: Alert Behavior During Therapy: WFL for tasks assessed/performed Overall Cognitive Status: Within Functional Limits for tasks assessed           General Comments General comments (skin integrity, edema, etc.): Difficult to get O2 sat reading throughout with poor pleth line. Pt was demonstrating no signs/symptoms of cardiac or respiratory distress throughout.      Pertinent Vitals/Pain Pain Assessment Pain Assessment: 0-10 Pain Score: 5  Pain Location: intermittent sternal pain and itching at incision site Pain Descriptors / Indicators: Sore, Aching Pain Intervention(s): Monitored during session, Limited activity within patient's tolerance     PT Goals (current goals can now be found in the care plan section) Acute Rehab PT Goals Patient Stated Goal: home PT Goal Formulation: With patient Time For Goal Achievement: 03/25/23 Potential to Achieve Goals: Good Progress towards PT goals:  Progressing toward goals    Frequency    Min 3X/week       PT Plan  Continue with current POC        AM-PAC PT "6 Clicks" Mobility   Outcome Measure  Help needed turning from your back to your side while in a flat bed without using bedrails?: None Help needed moving from lying on your back to sitting on the side of a flat bed without using bedrails?: A Little Help needed moving to and from a bed to a chair (including a wheelchair)?: A Little Help needed standing up from a chair using your arms (e.g., wheelchair or bedside chair)?: A Little Help needed to walk in hospital room?: A Little Help needed climbing 3-5 steps with a railing? : A Little 6 Click Score: 19    End of Session Equipment Utilized During Treatment: Oxygen;Gait belt Activity Tolerance: Patient tolerated treatment well Patient left: with call bell/phone within reach;in chair Nurse Communication: Mobility status PT Visit Diagnosis: Unsteadiness on feet (R26.81);Muscle weakness (generalized) (M62.81)     Time: 1610-9604 PT Time Calculation (min) (ACUTE ONLY): 24 min  Charges:    $Therapeutic Activity: 23-37 mins PT General Charges $$ ACUTE PT VISIT: 1 Visit                     Harrel Carina, DPT, CLT  Acute Rehabilitation Services Office: [phone] (Secure chat preferred)    [name] 03/15/2023, 12:56 PM

## 2023-03-16 DIAGNOSIS — I480 Paroxysmal atrial fibrillation: Secondary | ICD-10-CM | POA: Diagnosis not present

## 2023-03-16 DIAGNOSIS — J9601 Acute respiratory failure with hypoxia: Secondary | ICD-10-CM | POA: Diagnosis not present

## 2023-03-16 DIAGNOSIS — I5021 Acute systolic (congestive) heart failure: Secondary | ICD-10-CM | POA: Diagnosis not present

## 2023-03-16 DIAGNOSIS — R570 Cardiogenic shock: Secondary | ICD-10-CM | POA: Diagnosis not present

## 2023-03-16 DIAGNOSIS — I5023 Acute on chronic systolic (congestive) heart failure: Secondary | ICD-10-CM | POA: Diagnosis not present

## 2023-03-16 DIAGNOSIS — I5082 Biventricular heart failure: Secondary | ICD-10-CM | POA: Diagnosis not present

## 2023-03-16 DIAGNOSIS — Z952 Presence of prosthetic heart valve: Secondary | ICD-10-CM | POA: Diagnosis not present

## 2023-03-16 LAB — BASIC METABOLIC PANEL
Anion gap: 11 (ref 5–15)
BUN: 21 mg/dL (ref 8–23)
CO2: 32 mmol/L (ref 22–32)
Calcium: 7.9 mg/dL — ABNORMAL LOW (ref 8.9–10.3)
Chloride: 90 mmol/L — ABNORMAL LOW (ref 98–111)
Creatinine, Ser: 0.88 mg/dL (ref 0.44–1.00)
GFR, Estimated: >60 mL/min (ref >60)
Glucose, Bld: 126 mg/dL — ABNORMAL HIGH (ref 70–99)
Potassium: 3.6 mmol/L (ref 3.5–5.1)
Sodium: 133 mmol/L — ABNORMAL LOW (ref 135–145)

## 2023-03-16 LAB — COOXEMETRY PANEL
Carboxyhemoglobin: 1.9 % — ABNORMAL HIGH (ref 0.5–1.5)
Methemoglobin: <0.7 % (ref 0.0–1.5)
O2 Saturation: 61.7 %
Total hemoglobin: 9.2 g/dL — ABNORMAL LOW (ref 12.0–16.0)

## 2023-03-16 LAB — GLUCOSE, CAPILLARY
Glucose-Capillary: 108 mg/dL — ABNORMAL HIGH (ref 70–99)
Glucose-Capillary: 102 mg/dL — ABNORMAL HIGH (ref 70–99)
Glucose-Capillary: 100 mg/dL — ABNORMAL HIGH (ref 70–99)
Glucose-Capillary: 124 mg/dL — ABNORMAL HIGH (ref 70–99)

## 2023-03-16 LAB — CBC
HCT: 27.5 % — ABNORMAL LOW (ref 36.0–46.0)
Hemoglobin: 9 g/dL — ABNORMAL LOW (ref 12.0–15.0)
MCH: 30.6 pg (ref 26.0–34.0)
MCHC: 32.7 g/dL (ref 30.0–36.0)
MCV: 93.5 fL (ref 80.0–100.0)
Platelets: 419 10*3/uL — ABNORMAL HIGH (ref 150–400)
RBC: 2.94 MIL/uL — ABNORMAL LOW (ref 3.87–5.11)
RDW: 14.9 % (ref 11.5–15.5)
WBC: 10 10*3/uL (ref 4.0–10.5)
nRBC: 0 % (ref 0.0–0.2)

## 2023-03-16 LAB — MAGNESIUM: Magnesium: 2.1 mg/dL (ref 1.7–2.4)

## 2023-03-16 LAB — PROTIME-INR
INR: 1.4 — ABNORMAL HIGH (ref 0.8–1.2)
Prothrombin Time: 17.2 s — ABNORMAL HIGH (ref 11.4–15.2)

## 2023-03-16 MED ORDER — POTASSIUM CHLORIDE 20 MEQ PO PACK
20.0000 meq | PACK | ORAL | Status: AC
  Administered 2023-03-16 (×3): 20 meq
  Filled 2023-03-16 (×3): qty 1

## 2023-03-16 MED ORDER — WARFARIN SODIUM 5 MG PO TABS
5.0000 mg | ORAL_TABLET | Freq: Once | ORAL | Status: AC
  Administered 2023-03-16: 5 mg via ORAL
  Filled 2023-03-16: qty 1

## 2023-03-16 MED ORDER — SORBITOL 70 % SOLN
30.0000 mL | Freq: Once | Status: AC
  Administered 2023-03-16: 30 mL via ORAL
  Filled 2023-03-16: qty 30

## 2023-03-16 NOTE — Progress Notes (Signed)
NAME:  Penny Mccullough, MRN:  [id number], DOB:  [birth year], LOS: 10 ADMISSION DATE:  03/06/2023, CONSULTATION DATE: 03/06/2023 REFERRING MD: Eugenio Hoes, CHIEF COMPLAINT: Status post mitral valve replacement/tricuspid valve repair  History of Present Illness:  78 year old female with COPD, ex-smoker coronary artery disease status post CABG and MI status post LAD stent, paroxysmal A-fib on Eliquis and amiodarone and rheumatic mitral valve disease who underwent workup for exertional dyspnea, noted to have severe mitral regurgitation and tricuspid regurgitation, today she underwent mitral valve replacement and tricuspid valve repair.  She was transferred to ICU postop, PCCM was consulted for help evaluation medical management  Pertinent  Medical History   Past Medical History:  Diagnosis Date   CAD (coronary artery disease)    CHF (congestive heart failure) (HCC)    COPD (chronic obstructive pulmonary disease) (HCC)    CVA (cerebral vascular accident) (HCC)    Dysrhythmia    A.Fib   Heart murmur    History of kidney stones    HLD (hyperlipidemia)    Hypertension    MI (myocardial infarction) (HCC) 10/2020     Significant Hospital Events: Including procedures, antibiotic start and stop dates in addition to other pertinent events   1/16 MVR (bioprosthetic), TV repair  Interim History / Subjective:  Today she denies complaints. Afebrile overnight.  Remains on amiodarone, milrinone 0.178mcg. Low Bps overnight when sleeping.    Objective   Blood pressure 113/69, pulse 100, temperature (!) 97.4 F (36.3 C), temperature source Axillary, resp. rate 18, height 5\' 1"  (1.549 m), weight 61.4 kg, SpO2 (!) 85%. CVP:  [1 mmHg-46 mmHg] 6 mmHg      Intake/Output Summary (Last 24 hours) at 03/16/2023 0728 Last data filed at 03/16/2023 0700 Gross per 24 hour  Intake 1722.83 ml  Output 2900 ml  Net -1177.17 ml   Filed Weights   03/14/23 0500 03/15/23 0500 03/16/23 0435  Weight: 62.3 kg 61.3 kg  61.4 kg    Examination: Elderly woman sitting in the chair in NAD Hard of hearing, but answering questions. Moves all extremities.  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 EF 45-50% in 9/ 2024 Moderate PH - Coumadin dosing per Pharmacy, remains subtherapeutic - serial coox; con't milrinone per AHF -con't amidoarone -Eventually needs cardioversion when warfarin is therapeutic -Eventually needs GDMT reinitiated as tolerated. Low Bps and ongoing shock have limited this.   HLD -statin, zetia  Acute respiratory failure with hypoxia, postoperative pulmonary edema- related to cardioplegia/stunning and afib COPD- s/p tx for presumed AECOPD -con't brovana, yupelri, pulmicort -lasix BID -wean O2 for SpO2 >90% -pulmonary hygiene  Hard of hearing -PT, OT -delirium precautions  Hypokalemia, resolved - additional repletion for ongoing diuresis  Deconditioning -con't ambulation  Constipation; last BM 1/20 -encouraged bowel regimen; she has been refusing -ambulate -adding sorbitol    Steffanie Dunn, DO 03/16/23 7:28 AM [facility]  For contact information, see Amion. If no response to pager, please call PCCM consult pager. After hours, 7PM- 7AM, please call Elink.

## 2023-03-16 NOTE — Progress Notes (Signed)
      [address]       [place] [address]             [phone]                 10 Days Post-Op Procedure(s) (LRB): REDO STERNOTOMY (N/A) MITRAL VALVE (MV) REPLACEMENT, USING 29 MM MEDTRONIC MOSAIC PORCINE HEART VALVE (N/A) TRANSESOPHAGEAL ECHOCARDIOGRAM (N/A) TRICUSPID VALVE REPAIR, USING 30 MM MC3 ANNULOPLASTY RING   Events: No events _______________________________________________________________ Vitals: BP 112/62   Pulse 79   Temp (!) 97.4 F (36.3 C) (Axillary)   Resp 14   Ht 5\' 1"  (1.549 m)   Wt 61.4 kg   SpO2 96%   BMI 25.58 kg/m  Filed Weights   03/14/23 0500 03/15/23 0500 03/16/23 0435  Weight: 62.3 kg 61.3 kg 61.4 kg     - Neuro: alert NAD   - Cardiovascular: afib  Drips: amio, milr.   CVP:  [1 mmHg-46 mmHg] 7 mmHg  - Pulm: EWOB    ABG    Component Value Date/Time   PHART 7.373 03/07/2023 0506   PCO2ART 38.3 03/07/2023 0506   PO2ART 60 (L) 03/07/2023 0506   HCO3 22.2 03/07/2023 0506   TCO2 23 03/07/2023 0506   ACIDBASEDEF 3.0 (H) 03/07/2023 0506   O2SAT 61.7 03/16/2023 0436    - Abd: ND - Extremity: warm  .Intake/Output      01/24 0701 01/25 0700 01/25 0701 01/26 0700   P.Penny. 960 240   I.V. (mL/kg) 513 (8.4)    IV Piggyback 249.9    Total Intake(mL/kg) 1722.8 (28.1) 240 (3.9)   Urine (mL/kg/hr) 2900 (2) 400 (1.9)   Total Output 2900 400   Net -1177.2 -160           _______________________________________________________________ Labs:    Latest Ref Rng & Units 03/16/2023    4:36 AM 03/15/2023    4:54 AM 03/14/2023    4:18 AM  CBC  WBC 4.0 - 10.5 K/uL 10.0  8.2  9.4   Hemoglobin 12.0 - 15.0 g/dL 9.0  8.5  9.2   Hematocrit 36.0 - 46.0 % 27.5  25.8  28.5   Platelets 150 - 400 K/uL 419  318  299       Latest Ref Rng & Units 03/16/2023    4:38 AM 03/15/2023    2:12 PM 03/15/2023    4:57 AM  CMP  Glucose 70 - 99 mg/dL 440  102  725   BUN 8 - 23 mg/dL 21  23  20    Creatinine 0.44 - 1.00 mg/dL 3.66  4.40   3.47   Sodium 135 - 145 mmol/L 133  133  135   Potassium 3.5 - 5.1 mmol/L 3.6  4.3  3.2   Chloride 98 - 111 mmol/L 90  89  89   CO2 22 - 32 mmol/L 32  30  34   Calcium 8.9 - 10.3 mg/dL 7.9  8.0  7.8     CXR: -  _______________________________________________________________  Assessment and Plan: POD 10 s/p MVR, TVR  Neuro: pain controlled CV: on amio, and milr.  On coumadin Pulm: IS, ambulatino Renal: creat stable GI: on diet Heme: INR 1.4 ID: [email or web address] Endo: SSI Dispo: continue ICU care   Penny Mccullough Penny Mccullough 03/16/2023 10:26 AM

## 2023-03-16 NOTE — Progress Notes (Signed)
PHARMACY - ANTICOAGULATION CONSULT NOTE  Pharmacy Consult for warfarin Indication: afib + bioprosthetic mitral valve  Allergies  Allergen Reactions   Codeine Nausea And Vomiting    Patient Measurements: Height: 5\' 1"  (154.9 cm) Weight: 61.4 kg (135 lb 5.8 oz) IBW/kg (Calculated) : 47.8  Vital Signs: Temp: 98.1 F (36.7 C) (01/25 1130) Temp Source: Axillary (01/25 1130) BP: 112/77 (01/25 1200) Pulse Rate: 109 (01/25 1200)  Labs: Recent Labs    03/14/23 0418 03/15/23 0454 03/15/23 0457 03/15/23 1412 03/16/23 0436 03/16/23 0438  HGB 9.2* 8.5*  --   --  9.0*  --   HCT 28.5* 25.8*  --   --  27.5*  --   PLT 299 318  --   --  419*  --   LABPROT 20.3* 20.0*  --   --  17.2*  --   INR 1.7* 1.7*  --   --  1.4*  --   CREATININE 0.94  --  0.91 0.99  --  0.88    Estimated Creatinine Clearance: 45.7 mL/min (by C-G formula based on SCr of 0.88 mg/dL).   Medical History: Past Medical History:  Diagnosis Date   CAD (coronary artery disease)    CHF (congestive heart failure) (HCC)    COPD (chronic obstructive pulmonary disease) (HCC)    CVA (cerebral vascular accident) (HCC)    Dysrhythmia    A.Fib   Heart murmur    History of kidney stones    HLD (hyperlipidemia)    Hypertension    MI (myocardial infarction) (HCC) 10/2020    Assessment: 78 yo female previously on apixaban for atrial fibrillation, pharmacy asked to switch to Coumadin for bioprosthetic mitral valve + afib starting 03/09/22.  INR is subtherapeutic at 1.4. CBC is stable with no signs of bleeding. Patient's appetite is still improving. Amiodarone was switched from PO to IV on 1/23, will monitor therapy with known DDI with warfarin.   Goal of Therapy:  INR 2-3 Monitor platelets by anticoagulation protocol: Yes   Plan:  Coumadin 5 mg po x 1 tonight Monitor daily INR, CBC, and signs of bleeding Monitor diet, amiodarone administration, and other potential DDI.  Reece Leader, Colon Flattery,  BCCP Clinical Pharmacist  03/16/2023 4:16 PM   [facility] pharmacy phone numbers are listed on [email or web address]

## 2023-03-16 NOTE — Progress Notes (Signed)
Advanced Heart Failure Rounding Note  HF Cardiologist: Dr. Shirlee Latch  Chief Complaint: SOB   Patient Profile  78 yo woman with PAF, COPD, CAD s/p previous CABG (LIMA) -> LIMA occluded. Patent LAD stents cath 2/24. 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    Underwent MV Replacement/TV Repair and LAA ligation on 03/06/23. Post op course c/b afib w/ RVR and biventricular failure R>L (drop in LVEF 40-45%>>25-30%, RV severely reduced).  Subjective:    1/23 started on milrinone for low co-ox (47%)  On Milrinone 0.125, co-ox improved 62 % Diuresed 3L yesterday with IV lasix  On IV amio. Remains in AF  INR 1.7 -> 1.4  Denies CP or SOB. BP low overnight   Objective:   Weight Range: 61.4 kg Body mass index is 25.58 kg/m.   Vital Signs:   Temp:  [97.4 F (36.3 C)-98.1 F (36.7 C)] 98.1 F (36.7 C) (01/25 1130) Pulse Rate:  [75-107] 79 (01/25 0914) Resp:  [12-23] 14 (01/25 0914) BP: (86-142)/(49-81) 112/62 (01/25 0900) SpO2:  [82 %-99 %] 95 % (01/25 1200) Weight:  [61.4 kg] 61.4 kg (01/25 0435) Last BM Date : 03/12/23  Weight change: Filed Weights   03/14/23 0500 03/15/23 0500 03/16/23 0435  Weight: 62.3 kg 61.3 kg 61.4 kg    Intake/Output:   Intake/Output Summary (Last 24 hours) at 03/16/2023 1221 Last data filed at 03/16/2023 0914 Gross per 24 hour  Intake 1081.04 ml  Output 2725 ml  Net -1643.96 ml      Physical Exam   General:  Lying flat in bed. No resp difficulty HEENT: normal Neck: supple. no JVD. Carotids 2+ bilat; no bruits. No lymphadenopathy or thryomegaly appreciated. Cor: Irregular rate & rhythm. No rubs, gallops or murmurs. Sternal wound ok  Lungs: clear Abdomen: soft, nontender, nondistended. No hepatosplenomegaly. No bruits or masses. Good bowel sounds. Extremities: no cyanosis, clubbing, rash, edema Neuro: alert & orientedx3, cranial nerves grossly intact. moves all 4 extremities w/o difficulty. Affect pleasant  Telemetry   Afib 70-90 Personally  reviewed   Labs    CBC Recent Labs    03/15/23 0454 03/16/23 0436  WBC 8.2 10.0  HGB 8.5* 9.0*  HCT 25.8* 27.5*  MCV 93.8 93.5  PLT 318 419*   Basic Metabolic Panel Recent Labs    19/14/78 0457 03/15/23 1412 03/16/23 0438  NA 135 133* 133*  K 3.2* 4.3 3.6  CL 89* 89* 90*  CO2 34* 30 32  GLUCOSE 147* 165* 126*  BUN 20 23 21   CREATININE 0.91 0.99 0.88  CALCIUM 7.8* 8.0* 7.9*  MG 1.7  --  2.1   Liver Function Tests No results for input(s): "AST", "ALT", "ALKPHOS", "BILITOT", "PROT", "ALBUMIN" in the last 72 hours. No results for input(s): "LIPASE", "AMYLASE" in the last 72 hours. Cardiac Enzymes No results for input(s): "CKTOTAL", "CKMB", "CKMBINDEX", "TROPONINI" in the last 72 hours.  BNP: BNP (last 3 results) Recent Labs    06/18/22 1458 07/19/22 1227 02/05/23 1130  BNP 363.7* 256.9* 519.0*    ProBNP (last 3 results) Recent Labs    08/15/22 1209  PROBNP 435     D-Dimer No results for input(s): "DDIMER" in the last 72 hours. Hemoglobin A1C No results for input(s): "HGBA1C" in the last 72 hours. Fasting Lipid Panel No results for input(s): "CHOL", "HDL", "LDLCALC", "TRIG", "CHOLHDL", "LDLDIRECT" in the last 72 hours. Thyroid Function Tests No results for input(s): "TSH", "T4TOTAL", "T3FREE", "THYROIDAB" in the last 72 hours.  Invalid input(s): "FREET3"  Other  results:   Imaging    No results found.    Medications:     Scheduled Medications:  arformoterol  15 mcg Nebulization BID   aspirin  81 mg Oral Daily   atorvastatin  80 mg Oral Daily   bisacodyl  10 mg Oral Daily   Or   bisacodyl  10 mg Rectal Daily   budesonide (PULMICORT) nebulizer solution  0.5 mg Nebulization BID   Chlorhexidine Gluconate Cloth  6 each Topical Daily   docusate sodium  200 mg Oral Daily   ezetimibe  10 mg Oral Daily   feeding supplement  237 mL Oral BID BM   furosemide  80 mg Intravenous Q12H   insulin aspart  0-15 Units Subcutaneous TID WC   mouth  rinse  15 mL Mouth Rinse 4 times per day   pantoprazole  40 mg Oral Daily   polyethylene glycol  17 g Oral Daily   potassium chloride  20 mEq Per Tube Q4H   revefenacin  175 mcg Nebulization Daily   sodium chloride flush  10-40 mL Intracatheter Q12H   Warfarin - Pharmacist Dosing Inpatient   Does not apply q1600    Infusions:  albumin human     amiodarone 30 mg/hr (03/16/23 0804)   milrinone 0.125 mcg/kg/min (03/16/23 0700)    PRN Medications: albumin human, albuterol, lactulose, ondansetron, mouth rinse, oxyCODONE, sodium chloride flush, traMADol    Assessment/Plan   1. Severe MR/TR - 2/2 rheumatic heart disease  - S/P MV Replacement (bio) TV Repair, LAA - trivial MR on echo 1/21   2. PAF - Developed A fib in 2022. Had cardioversion 2023.  - Back in A fib w/ RVR post-op.  - continue amio gtt at 30/hr  - On warfarin INR 1.4  - will need DCCV when INR therapeutic (LAA ligated)  - Discussed warfarin dosing with PharmD personally.   3. Acute Biventricular HFrEF w/ Low Output  - Intra-op TEE EF 40-45% - Post op Echo EF down to 25-30% with RV severely reduced.  - A fib RVR likely contributing.  - Initial Co-ox low 49%, Started Milrinone 0.125 - Co-ox 62%, continue milrinone at 0.125 - CVP 7-8 Hold diuretics today - GDMT limited currently by soft BP  - plan DCCV once INR therapeutic as above   4. Acute Hypoxic Respiratory Failure - COPD at baseline. She does require oxygen at home. - much improved with diuresis   5. Anemia, Expected Blood Loss - hgb stable, 8.5 today   6. Hypokalemia/Hypomagnesemia  - supp    Length of Stay: 10  Arvilla Meres, MD  03/16/2023, 12:21 PM  Advanced Heart Failure Team Pager [phone] (M-F; 7a - 5p)  Please contact [facility] for night-coverage after hours (5p -7a ) and weekends on [email or web address]

## 2023-03-17 DIAGNOSIS — R570 Cardiogenic shock: Secondary | ICD-10-CM | POA: Diagnosis not present

## 2023-03-17 DIAGNOSIS — I480 Paroxysmal atrial fibrillation: Secondary | ICD-10-CM | POA: Diagnosis not present

## 2023-03-17 DIAGNOSIS — J9601 Acute respiratory failure with hypoxia: Secondary | ICD-10-CM | POA: Diagnosis not present

## 2023-03-17 DIAGNOSIS — I5082 Biventricular heart failure: Secondary | ICD-10-CM | POA: Diagnosis not present

## 2023-03-17 DIAGNOSIS — I5021 Acute systolic (congestive) heart failure: Secondary | ICD-10-CM | POA: Diagnosis not present

## 2023-03-17 DIAGNOSIS — Z952 Presence of prosthetic heart valve: Secondary | ICD-10-CM | POA: Diagnosis not present

## 2023-03-17 DIAGNOSIS — I5023 Acute on chronic systolic (congestive) heart failure: Secondary | ICD-10-CM | POA: Diagnosis not present

## 2023-03-17 DIAGNOSIS — K5900 Constipation, unspecified: Secondary | ICD-10-CM

## 2023-03-17 LAB — BASIC METABOLIC PANEL
Anion gap: 12 (ref 5–15)
BUN: 19 mg/dL (ref 8–23)
CO2: 32 mmol/L (ref 22–32)
Calcium: 8.4 mg/dL — ABNORMAL LOW (ref 8.9–10.3)
Chloride: 89 mmol/L — ABNORMAL LOW (ref 98–111)
Creatinine, Ser: 0.86 mg/dL (ref 0.44–1.00)
GFR, Estimated: >60 mL/min (ref >60)
Glucose, Bld: 163 mg/dL — ABNORMAL HIGH (ref 70–99)
Potassium: 3.8 mmol/L (ref 3.5–5.1)
Sodium: 133 mmol/L — ABNORMAL LOW (ref 135–145)

## 2023-03-17 LAB — GLUCOSE, CAPILLARY
Glucose-Capillary: 114 mg/dL — ABNORMAL HIGH (ref 70–99)
Glucose-Capillary: 112 mg/dL — ABNORMAL HIGH (ref 70–99)
Glucose-Capillary: 99 mg/dL (ref 70–99)
Glucose-Capillary: 53 mg/dL — ABNORMAL LOW (ref 70–99)
Glucose-Capillary: 82 mg/dL (ref 70–99)

## 2023-03-17 LAB — CBC
HCT: 26 % — ABNORMAL LOW (ref 36.0–46.0)
Hemoglobin: 8.4 g/dL — ABNORMAL LOW (ref 12.0–15.0)
MCH: 30.2 pg (ref 26.0–34.0)
MCHC: 32.3 g/dL (ref 30.0–36.0)
MCV: 93.5 fL (ref 80.0–100.0)
Platelets: 393 10*3/uL (ref 150–400)
RBC: 2.78 MIL/uL — ABNORMAL LOW (ref 3.87–5.11)
RDW: 15 % (ref 11.5–15.5)
WBC: 11.2 10*3/uL — ABNORMAL HIGH (ref 4.0–10.5)
nRBC: 0 % (ref 0.0–0.2)

## 2023-03-17 LAB — COOXEMETRY PANEL
Carboxyhemoglobin: 2 % — ABNORMAL HIGH (ref 0.5–1.5)
Methemoglobin: <0.7 % (ref 0.0–1.5)
O2 Saturation: 57.9 %
Total hemoglobin: 8.5 g/dL — ABNORMAL LOW (ref 12.0–16.0)

## 2023-03-17 LAB — PROTIME-INR
INR: 1.5 — ABNORMAL HIGH (ref 0.8–1.2)
Prothrombin Time: 18.2 s — ABNORMAL HIGH (ref 11.4–15.2)

## 2023-03-17 MED ORDER — WARFARIN SODIUM 5 MG PO TABS
5.0000 mg | ORAL_TABLET | Freq: Once | ORAL | Status: AC
  Administered 2023-03-17: 5 mg via ORAL
  Filled 2023-03-17: qty 1

## 2023-03-17 MED ORDER — INSULIN ASPART 100 UNIT/ML IJ SOLN: 1.0000 [IU] | Freq: Three times a day (TID) | INTRAMUSCULAR | Status: DC

## 2023-03-17 MED ORDER — POTASSIUM CHLORIDE CRYS ER 20 MEQ PO TBCR
40.0000 meq | EXTENDED_RELEASE_TABLET | Freq: Once | ORAL | Status: AC
  Administered 2023-03-17: 40 meq via ORAL
  Filled 2023-03-17: qty 2

## 2023-03-17 NOTE — Progress Notes (Addendum)
Advanced Heart Failure Rounding Note  HF Cardiologist: Dr. Shirlee Latch  Chief Complaint: SOB   Patient Profile  78 yo woman with PAF, COPD, CAD s/p previous CABG (LIMA) -> LIMA occluded. Patent LAD stents cath 2/24. 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    Underwent MV Replacement/TV Repair and LAA ligation on 03/06/23. Post op course c/b afib w/ RVR and biventricular failure R>L (drop in LVEF 40-45%>>25-30%, RV severely reduced).  Subjective:    1/23 started on milrinone for low co-ox (47%)  On Milrinone 0.125, co-ox improved 58%   On IV amio. Remains in AF  Remains on lasix 80 IV bid. Weight down 14 pounds over last week. Now below pre-op weight. CVP 5-6  INR 1.7 -> 1.4 -> 1.5  Denies CP or SOB.   Objective:   Weight Range: 60 kg Body mass index is 24.99 kg/m.   Vital Signs:   Temp:  [97.8 F (36.6 C)-98.1 F (36.7 C)] 97.8 F (36.6 C) (01/26 0830) Pulse Rate:  [67-116] 106 (01/26 0852) Resp:  [9-29] 20 (01/26 0852) BP: (86-120)/(49-81) 102/62 (01/26 0800) SpO2:  [75 %-100 %] 97 % (01/26 0852) Weight:  [60 kg] 60 kg (01/26 0500) Last BM Date : 03/12/23  Weight change: Filed Weights   03/15/23 0500 03/16/23 0435 03/17/23 0500  Weight: 61.3 kg 61.4 kg 60 kg    Intake/Output:   Intake/Output Summary (Last 24 hours) at 03/17/2023 1016 Last data filed at 03/17/2023 0900 Gross per 24 hour  Intake 917.17 ml  Output 2350 ml  Net -1432.83 ml      Physical Exam   General:  Elderly No resp difficulty HEENT: normal Neck: supple. no JVD. Carotids 2+ bilat; no bruits. No lymphadenopathy or thryomegaly appreciated. Cor:  Irregular rate & rhythm. No rubs, gallops or murmurs. Lungs: clear Abdomen: soft, nontender, nondistended. No hepatosplenomegaly. No bruits or masses. Good bowel sounds. Extremities: no cyanosis, clubbing, rash, edema Neuro: alert & orientedx3, cranial nerves grossly intact. moves all 4 extremities w/o difficulty. Affect pleasant   Telemetry   Afib 90-110  Personally reviewed   Labs    CBC Recent Labs    03/16/23 0436 03/17/23 0331  WBC 10.0 11.2*  HGB 9.0* 8.4*  HCT 27.5* 26.0*  MCV 93.5 93.5  PLT 419* 393   Basic Metabolic Panel Recent Labs    11/91/47 0457 03/15/23 1412 03/16/23 0438 03/17/23 0410  NA 135   < > 133* 133*  K 3.2*   < > 3.6 3.8  CL 89*   < > 90* 89*  CO2 34*   < > 32 32  GLUCOSE 147*   < > 126* 163*  BUN 20   < > 21 19  CREATININE 0.91   < > 0.88 0.86  CALCIUM 7.8*   < > 7.9* 8.4*  MG 1.7  --  2.1  --    < > = values in this interval not displayed.   Liver Function Tests No results for input(s): "AST", "ALT", "ALKPHOS", "BILITOT", "PROT", "ALBUMIN" in the last 72 hours. No results for input(s): "LIPASE", "AMYLASE" in the last 72 hours. Cardiac Enzymes No results for input(s): "CKTOTAL", "CKMB", "CKMBINDEX", "TROPONINI" in the last 72 hours.  BNP: BNP (last 3 results) Recent Labs    06/18/22 1458 07/19/22 1227 02/05/23 1130  BNP 363.7* 256.9* 519.0*    ProBNP (last 3 results) Recent Labs    08/15/22 1209  PROBNP 435     D-Dimer No results for input(s): "DDIMER" in  the last 72 hours. Hemoglobin A1C No results for input(s): "HGBA1C" in the last 72 hours. Fasting Lipid Panel No results for input(s): "CHOL", "HDL", "LDLCALC", "TRIG", "CHOLHDL", "LDLDIRECT" in the last 72 hours. Thyroid Function Tests No results for input(s): "TSH", "T4TOTAL", "T3FREE", "THYROIDAB" in the last 72 hours.  Invalid input(s): "FREET3"  Other results:   Imaging    No results found.    Medications:     Scheduled Medications:  arformoterol  15 mcg Nebulization BID   aspirin  81 mg Oral Daily   atorvastatin  80 mg Oral Daily   bisacodyl  10 mg Oral Daily   Or   bisacodyl  10 mg Rectal Daily   budesonide (PULMICORT) nebulizer solution  0.5 mg Nebulization BID   Chlorhexidine Gluconate Cloth  6 each Topical Daily   docusate sodium  200 mg Oral Daily   ezetimibe  10 mg Oral Daily   feeding  supplement  237 mL Oral BID BM   furosemide  80 mg Intravenous Q12H   insulin aspart  0-15 Units Subcutaneous TID WC   mouth rinse  15 mL Mouth Rinse 4 times per day   pantoprazole  40 mg Oral Daily   polyethylene glycol  17 g Oral Daily   revefenacin  175 mcg Nebulization Daily   sodium chloride flush  10-40 mL Intracatheter Q12H   Warfarin - Pharmacist Dosing Inpatient   Does not apply q1600    Infusions:  albumin human     amiodarone 30 mg/hr (03/17/23 0600)   milrinone 0.125 mcg/kg/min (03/17/23 0600)    PRN Medications: albumin human, albuterol, lactulose, ondansetron, mouth rinse, oxyCODONE, sodium chloride flush, traMADol    Assessment/Plan   1. Severe MR/TR - 2/2 rheumatic heart disease  - S/P MV Replacement (bio) TV Repair, LAA - trivial MR on echo 1/21 - continue to ambulate. Encourage IS   2. PAF - Developed A fib in 2022. Had cardioversion 2023.  - Back in A fib w/ RVR post-op.  - continue amio gtt at 30/hr  - On warfarin INR 1.5 - will need DCCV when INR therapeutic (LAA ligated)  - Discussed warfarin dosing with PharmD personally.   3. Acute Biventricular HFrEF w/ Low Output  - Intra-op TEE EF 40-45% - Post op Echo EF down to 25-30% with RV severely reduced.  - A fib RVR likely contributing.  - Initial Co-ox low 49%, Started Milrinone 0.125 - Co-ox 62% -> 58%, continue milrinone at 0.125 (suspect co-ox down in setting of decreased volume status today) - CVP now 5-6. Weight below baseline. Stop lasix  - GDMT limited currently by soft BP  - plan DCCV once INR therapeutic as above    4. Acute Hypoxic Respiratory Failure - COPD at baseline. She does require oxygen at home. - resolved with diuresis   5. Anemia, Expected Blood Loss - hgb stable, 8.4 today   6. Hypokalemia/Hypomagnesemia  - K 3.8  supp     Length of Stay: 45  Arvilla Meres, MD  03/17/2023, 10:16 AM  Advanced Heart Failure Team Pager [phone] (M-F; 7a - 5p)  Please contact  [facility] for night-coverage after hours (5p -7a ) and weekends on [email or web address]

## 2023-03-17 NOTE — Progress Notes (Signed)
      [address]       [place] [address]             [phone]                 11 Days Post-Op Procedure(s) (LRB): REDO STERNOTOMY (N/A) MITRAL VALVE (MV) REPLACEMENT, USING 29 MM MEDTRONIC MOSAIC PORCINE HEART VALVE (N/A) TRANSESOPHAGEAL ECHOCARDIOGRAM (N/A) TRICUSPID VALVE REPAIR, USING 30 MM MC3 ANNULOPLASTY RING   Events: No events _______________________________________________________________ Vitals: BP 102/62   Pulse (!) 106   Temp 97.8 F (36.6 C) (Oral)   Resp 20   Ht 5\' 1"  (1.549 m)   Wt 60 kg   SpO2 97%   BMI 24.99 kg/m  Filed Weights   03/15/23 0500 03/16/23 0435 03/17/23 0500  Weight: 61.3 kg 61.4 kg 60 kg     - Neuro: alert NAD   - Cardiovascular: afib  Drips: amio, milr.   CVP:  [5 mmHg-7 mmHg] 6 mmHg  - Pulm: EWOB    ABG    Component Value Date/Time   PHART 7.373 03/07/2023 0506   PCO2ART 38.3 03/07/2023 0506   PO2ART 60 (L) 03/07/2023 0506   HCO3 22.2 03/07/2023 0506   TCO2 23 03/07/2023 0506   ACIDBASEDEF 3.0 (H) 03/07/2023 0506   O2SAT 57.9 03/17/2023 0331    - Abd: ND - Extremity: warm  .Intake/Output      01/25 0701 01/26 0700 01/26 0701 01/27 0700   P.O. 600 120   I.V. (mL/kg) 437.2 (7.3)    IV Piggyback     Total Intake(mL/kg) 1037.2 (17.3) 120 (2)   Urine (mL/kg/hr) 2600 (1.8) 150 (1)   Total Output 2600 150   Net -1562.8 -30        Urine Occurrence 3 x       _______________________________________________________________ Labs:    Latest Ref Rng & Units 03/17/2023    3:31 AM 03/16/2023    4:36 AM 03/15/2023    4:54 AM  CBC  WBC 4.0 - 10.5 K/uL 11.2  10.0  8.2   Hemoglobin 12.0 - 15.0 g/dL 8.4  9.0  8.5   Hematocrit 36.0 - 46.0 % 26.0  27.5  25.8   Platelets 150 - 400 K/uL 393  419  318       Latest Ref Rng & Units 03/17/2023    4:10 AM 03/16/2023    4:38 AM 03/15/2023    2:12 PM  CMP  Glucose 70 - 99 mg/dL 956  387  564   BUN 8 - 23 mg/dL 19  21  23    Creatinine 0.44 - 1.00 mg/dL  3.32  9.51  8.84   Sodium 135 - 145 mmol/L 133  133  133   Potassium 3.5 - 5.1 mmol/L 3.8  3.6  4.3   Chloride 98 - 111 mmol/L 89  90  89   CO2 22 - 32 mmol/L 32  32  30   Calcium 8.9 - 10.3 mg/dL 8.4  7.9  8.0     CXR: -  _______________________________________________________________  Assessment and Plan: POD 11 s/p MVR, TVR  Neuro: pain controlled CV: on amio, and milr.  On coumadin Pulm: IS, ambulatino Renal: creat stable GI: on diet Heme: INR 1.5 ID: [email or web address] Endo: SSI Dispo: continue ICU care   Corliss Skains 03/17/2023 9:24 AM

## 2023-03-17 NOTE — Progress Notes (Signed)
PHARMACY - ANTICOAGULATION CONSULT NOTE  Pharmacy Consult for warfarin Indication: afib + bioprosthetic mitral valve  Allergies  Allergen Reactions   Codeine Nausea And Vomiting    Patient Measurements: Height: 5\' 1"  (154.9 cm) Weight: 60 kg (132 lb 4.4 oz) IBW/kg (Calculated) : 47.8  Vital Signs: Temp: 97.8 F (36.6 C) (01/26 0830) Temp Source: Oral (01/26 0830) BP: 102/62 (01/26 0800) Pulse Rate: 106 (01/26 0852)  Labs: Recent Labs    03/15/23 0454 03/15/23 0457 03/15/23 1412 03/16/23 0436 03/16/23 0438 03/17/23 0331 03/17/23 0410  HGB 8.5*  --   --  9.0*  --  8.4*  --   HCT 25.8*  --   --  27.5*  --  26.0*  --   PLT 318  --   --  419*  --  393  --   LABPROT 20.0*  --   --  17.2*  --  18.2*  --   INR 1.7*  --   --  1.4*  --  1.5*  --   CREATININE  --    < > 0.99  --  0.88  --  0.86   < > = values in this interval not displayed.    Estimated Creatinine Clearance: 46.3 mL/min (by C-G formula based on SCr of 0.86 mg/dL).   Medical History: Past Medical History:  Diagnosis Date   CAD (coronary artery disease)    CHF (congestive heart failure) (HCC)    COPD (chronic obstructive pulmonary disease) (HCC)    CVA (cerebral vascular accident) (HCC)    Dysrhythmia    A.Fib   Heart murmur    History of kidney stones    HLD (hyperlipidemia)    Hypertension    MI (myocardial infarction) (HCC) 10/2020    Assessment: 78 yo female previously on apixaban for atrial fibrillation, pharmacy asked to switch to Coumadin for bioprosthetic mitral valve + afib starting 03/09/22.  INR is subtherapeutic at 1.5 but trending back up. CBC is stable with no signs of bleeding. Patient's appetite is still improving. Amiodarone was switched from PO to IV on 1/23, will monitor therapy with known DDI with warfarin.  Goal of Therapy:  INR 2-3 Monitor platelets by anticoagulation protocol: Yes   Plan:  Coumadin 5 mg po x 1 again tonight Monitor daily INR, CBC, and signs of  bleeding Monitor diet, amiodarone administration, and other potential DDI.  Reece Leader, Colon Flattery, [facility] Clinical Pharmacist  03/17/2023 10:34 AM   [facility] pharmacy phone numbers are listed on [email or web address]

## 2023-03-17 NOTE — Progress Notes (Signed)
   NAME:  Penny Mccullough, MRN:  [id number], DOB:  [birth year], LOS: 11 ADMISSION DATE:  03/06/2023, CONSULTATION DATE: 03/06/2023 REFERRING MD: Eugenio Hoes, CHIEF COMPLAINT: Status post mitral valve replacement/tricuspid valve repair  History of Present Illness:  78 year old female with COPD, ex-smoker coronary artery disease status post CABG and MI status post LAD stent, paroxysmal A-fib on Eliquis and amiodarone and rheumatic mitral valve disease who underwent workup for exertional dyspnea, noted to have severe mitral regurgitation and tricuspid regurgitation, today she underwent mitral valve replacement and tricuspid valve repair.  She was transferred to ICU postop, PCCM was consulted for help evaluation medical management  Pertinent  Medical History   Past Medical History:  Diagnosis Date   CAD (coronary artery disease)    CHF (congestive heart failure) (HCC)    COPD (chronic obstructive pulmonary disease) (HCC)    CVA (cerebral vascular accident) (HCC)    Dysrhythmia    A.Fib   Heart murmur    History of kidney stones    HLD (hyperlipidemia)    Hypertension    MI (myocardial infarction) (HCC) 10/2020     Significant Hospital Events: Including procedures, antibiotic start and stop dates in addition to other pertinent events   1/16 MVR (bioprosthetic), TV repair  Interim History / Subjective:  She denies complaints. Walking in the hall.  Remains on milrinone 0.153mcg.    Objective   Blood pressure 94/78, pulse 92, temperature 97.8 F (36.6 C), temperature source Oral, resp. rate 19, height 5\' 1"  (1.549 m), weight 60 kg, SpO2 95%. CVP:  [5 mmHg-7 mmHg] 6 mmHg      Intake/Output Summary (Last 24 hours) at 03/17/2023 0744 Last data filed at 03/17/2023 0600 Gross per 24 hour  Intake 1037.17 ml  Output 2600 ml  Net -1562.83 ml   Filed Weights   03/15/23 0500 03/16/23 0435 03/17/23 0500  Weight: 61.3 kg 61.4 kg 60 kg    Examination: Elderly woman walking in the hall in  NAD [place]/AT, eyes anicteric Breathing comfortably on [place], CTAB S1S2, irreg rhythm, mild tachycardia while walking Abd nondistended No Le edema, no cyanosis Skin warm, dry, no rashes.  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 EF 45-50% in 9/ 2024 Moderate PH - coumadin dosing per pharmacy --cannot get cardioversion until therapeutic on warfarin -serial coox -milrinone per AHF -hold additional diuresis -eventually needs GDMT as BP tolerates - amiodarone  HLD -con't zetia, statin  Acute respiratory failure with hypoxia, postoperative pulmonary edema- related to cardioplegia/stunning and afib COPD- s/p tx for presumed AECOPD -con't triple bronchodilators -lasix on hold -wean O2 for SpO2 >90% -pulmonary hygiene  Hard of hearing -PT, OT -delirium precautions  Hypokalemia, resolved -K+ repletion today for goal >4  Deconditioning -ambulation  Constipation; last BM 1/20 -bowel regimen, ambulate    Steffanie Dunn, DO 03/17/23 2:01 PM [facility]  For contact information, see Amion. If no response to pager, please call PCCM consult pager. After hours, 7PM- 7AM, please call Elink.

## 2023-03-18 ENCOUNTER — Ambulatory Visit: Payer: Self-pay

## 2023-03-18 DIAGNOSIS — I5082 Biventricular heart failure: Secondary | ICD-10-CM | POA: Diagnosis not present

## 2023-03-18 DIAGNOSIS — K5903 Drug induced constipation: Secondary | ICD-10-CM | POA: Diagnosis not present

## 2023-03-18 DIAGNOSIS — J9601 Acute respiratory failure with hypoxia: Secondary | ICD-10-CM | POA: Diagnosis not present

## 2023-03-18 DIAGNOSIS — Z952 Presence of prosthetic heart valve: Secondary | ICD-10-CM | POA: Diagnosis not present

## 2023-03-18 DIAGNOSIS — H9190 Unspecified hearing loss, unspecified ear: Secondary | ICD-10-CM | POA: Diagnosis not present

## 2023-03-18 DIAGNOSIS — I5021 Acute systolic (congestive) heart failure: Secondary | ICD-10-CM | POA: Diagnosis not present

## 2023-03-18 LAB — CBC
HCT: 25 % — ABNORMAL LOW (ref 36.0–46.0)
Hemoglobin: 8.1 g/dL — ABNORMAL LOW (ref 12.0–15.0)
MCH: 30.3 pg (ref 26.0–34.0)
MCHC: 32.4 g/dL (ref 30.0–36.0)
MCV: 93.6 fL (ref 80.0–100.0)
Platelets: 424 10*3/uL — ABNORMAL HIGH (ref 150–400)
RBC: 2.67 MIL/uL — ABNORMAL LOW (ref 3.87–5.11)
RDW: 14.9 % (ref 11.5–15.5)
WBC: 10.4 10*3/uL (ref 4.0–10.5)
nRBC: 0 % (ref 0.0–0.2)

## 2023-03-18 LAB — BASIC METABOLIC PANEL
Anion gap: 11 (ref 5–15)
BUN: 19 mg/dL (ref 8–23)
CO2: 30 mmol/L (ref 22–32)
Calcium: 8.1 mg/dL — ABNORMAL LOW (ref 8.9–10.3)
Chloride: 87 mmol/L — ABNORMAL LOW (ref 98–111)
Creatinine, Ser: 0.99 mg/dL (ref 0.44–1.00)
GFR, Estimated: 59 mL/min — ABNORMAL LOW (ref >60)
Glucose, Bld: 156 mg/dL — ABNORMAL HIGH (ref 70–99)
Potassium: 3.7 mmol/L (ref 3.5–5.1)
Sodium: 128 mmol/L — ABNORMAL LOW (ref 135–145)

## 2023-03-18 LAB — GLUCOSE, CAPILLARY
Glucose-Capillary: 142 mg/dL — ABNORMAL HIGH (ref 70–99)
Glucose-Capillary: 88 mg/dL (ref 70–99)
Glucose-Capillary: 117 mg/dL — ABNORMAL HIGH (ref 70–99)
Glucose-Capillary: 104 mg/dL — ABNORMAL HIGH (ref 70–99)

## 2023-03-18 LAB — COOXEMETRY PANEL
Carboxyhemoglobin: 1.9 % — ABNORMAL HIGH (ref 0.5–1.5)
Methemoglobin: <0.7 % (ref 0.0–1.5)
O2 Saturation: 62.5 %
Total hemoglobin: 8.5 g/dL — ABNORMAL LOW (ref 12.0–16.0)

## 2023-03-18 LAB — PROTIME-INR
INR: 2 — ABNORMAL HIGH (ref 0.8–1.2)
Prothrombin Time: 22.5 s — ABNORMAL HIGH (ref 11.4–15.2)

## 2023-03-18 LAB — MAGNESIUM: Magnesium: 2.1 mg/dL (ref 1.7–2.4)

## 2023-03-18 MED ORDER — WARFARIN SODIUM 2.5 MG PO TABS
2.5000 mg | ORAL_TABLET | Freq: Once | ORAL | Status: AC
  Administered 2023-03-18: 2.5 mg via ORAL
  Filled 2023-03-18: qty 1

## 2023-03-18 MED ORDER — POTASSIUM CHLORIDE CRYS ER 20 MEQ PO TBCR
20.0000 meq | EXTENDED_RELEASE_TABLET | ORAL | Status: AC
  Administered 2023-03-18 (×3): 20 meq via ORAL
  Filled 2023-03-18 (×3): qty 1

## 2023-03-18 MED ORDER — SORBITOL 70 % SOLN
30.0000 mL | Freq: Once | Status: AC
  Administered 2023-03-18: 30 mL via ORAL
  Filled 2023-03-18: qty 30

## 2023-03-18 MED ORDER — INSULIN ASPART 100 UNIT/ML IJ SOLN
0.0000 [IU] | Freq: Three times a day (TID) | INTRAMUSCULAR | Status: DC
  Administered 2023-03-20: 1 [IU] via SUBCUTANEOUS

## 2023-03-18 MED ORDER — SENNA 8.6 MG PO TABS
1.0000 | ORAL_TABLET | Freq: Two times a day (BID) | ORAL | Status: DC
  Administered 2023-03-18 (×2): 8.6 mg via ORAL
  Filled 2023-03-18 (×2): qty 1

## 2023-03-18 MED ORDER — INSULIN ASPART 100 UNIT/ML IJ SOLN: 0.0000 [IU] | Freq: Every day | INTRAMUSCULAR | Status: DC

## 2023-03-18 MED ORDER — SPIRONOLACTONE 25 MG PO TABS
25.0000 mg | ORAL_TABLET | Freq: Every day | ORAL | Status: DC
  Administered 2023-03-18 – 2023-03-22 (×4): 25 mg via ORAL
  Filled 2023-03-18 (×4): qty 1

## 2023-03-18 MED ORDER — LACTULOSE 10 GM/15ML PO SOLN
20.0000 g | Freq: Every day | ORAL | Status: DC
  Administered 2023-03-18: 20 g via ORAL
  Filled 2023-03-18: qty 30

## 2023-03-18 NOTE — Progress Notes (Signed)
 [address]       Penny Mccullough [address]             [phone]       HPI: Penny Mccullough is a 78 year old female with a past medical history of CAD (s/p LAD stents), HLD, HTN, COPD, atrial fibrillation on Eliquis , and severe mitral regurgitation. The patient returns for routine postoperative follow-up having undergone mitral valve replacement and tricuspid valve repair by Dr. Honey Lusty on 03/06/23. The patient had a long and complicated hospital stay including biventricular HFrEF likely due to atrial fibrillation with RVR treated with DCCV, acute hypoxic respiratory failure that resolved with diuresis, and UTI treated with Ceftriaxone . She was discharged in stable condition with home health on 1/31 but the home health agency has let us  know that the patient refused home health OT when she arrived. Her latest INR on 2/05 was 5.8, she continues to follow up with the coumadin  clinic. She saw cardiology on 2/05 and noted to have pain and was unable to fill her oxycodone  prescription, her blood pressure was also elevated but no medication changes were made.  Since hospital discharge the patient reports incisional pain that has been relieved with oxycodone  about 1-3 times per day and Tylenol . She denies chest pain and has minimal shortness of breath with exertion. She denies cough, dizziness and LOC. She is using a walker today and ambulates in the house about 2 times per day. PT comes tomorrow for their second visit. She follows up with coumadin  clinic on Wednesday after holding her coumadin . She admits to eating well and denies lower extremity swelling.   No current facility-administered medications for this visit.   No current outpatient medications on file.   Facility-Administered Medications Ordered in Other Visits  Medication Dose Route Frequency Provider Last Rate Last Admin   albumin  human 5 % solution 12.5 g  250 mL Intravenous Q15 min PRN Gold, Wayne E, PA-C       albuterol   (PROVENTIL ) (2.5 MG/3ML) 0.083% nebulizer solution 2.5 mg  2.5 mg Nebulization Q3H PRN Lemmie Pyo, NP   2.5 mg at 03/12/23 6213   amiodarone  (NEXTERONE  PREMIX) 360-4.14 MG/200ML-% (1.8 mg/mL) IV infusion  30 mg/hr Intravenous Continuous Bensimhon, Rheta Celestine, MD 16.67 mL/hr at 03/18/23 1200 30 mg/hr at 03/18/23 1200   arformoterol  (BROVANA ) nebulizer solution 15 mcg  15 mcg Nebulization BID Albino Alu, RPH   15 mcg at 03/18/23 0865   aspirin  chewable tablet 81 mg  81 mg Oral Daily [name], MD   81 mg at 03/18/23 7846   atorvastatin  (LIPITOR ) tablet 80 mg  80 mg Oral Daily Gold, Wayne E, PA-C   80 mg at 03/18/23 9629   bisacodyl  (DULCOLAX) EC tablet 10 mg  10 mg Oral Daily Gold, Wayne E, PA-C   10 mg at 03/18/23 5284   Or   bisacodyl  (DULCOLAX) suppository 10 mg  10 mg Rectal Daily Gold, Wayne E, PA-C       budesonide  (PULMICORT ) nebulizer solution 0.5 mg  0.5 mg Nebulization BID Lemmie Pyo, NP   0.5 mg at 03/18/23 1324   Chlorhexidine  Gluconate Cloth 2 % PADS 6 each  6 each Topical Daily [name], MD   6 each at 03/17/23 0900   docusate sodium  (COLACE) capsule 200 mg  200 mg Oral Daily Gold, Wayne E, PA-C   200 mg at 03/18/23 4010   ezetimibe  (ZETIA ) tablet 10 mg  10 mg Oral  Daily Gold, Wayne E, PA-C   10 mg at 03/18/23 1610   insulin  aspart (novoLOG ) injection 1-3 Units  1-3 Units Subcutaneous TID WC Jaquita Merl P, DO       lactulose  (CHRONULAC ) 10 GM/15ML solution 20 g  20 g Oral Daily Jaquita Merl P, DO   20 g at 03/18/23 1023   milrinone  (PRIMACOR ) 20 MG/100 ML (0.2 mg/mL) infusion  0.125 mcg/kg/min Intravenous Continuous Bensimhon, Rheta Celestine, MD 2.34 mL/hr at 03/18/23 1200 0.125 mcg/kg/min at 03/18/23 1200   ondansetron  (ZOFRAN ) injection 4 mg  4 mg Intravenous Q6H PRN Albino Alu, RPH   4 mg at 03/12/23 1722   Oral care mouth rinse  15 mL Mouth Rinse 4 times per day [name], MD   15 mL at 03/18/23 0830   Oral care mouth rinse  15 mL Mouth Rinse PRN [name],  MD       oxyCODONE  (Oxy IR/ROXICODONE ) immediate release tablet 5 mg  5 mg Oral Q4H PRN [name], MD   5 mg at 03/18/23 1023   polyethylene glycol (MIRALAX  / GLYCOLAX ) packet 17 g  17 g Oral Daily Zelphia Higashi, MD   17 g at 03/18/23 9604   potassium chloride  SA (KLOR-CON  M) CR tablet 20 mEq  20 mEq Oral Q4H Weldner, Paul, MD   20 mEq at 03/18/23 5409   revefenacin  (YUPELRI ) nebulizer solution 175 mcg  175 mcg Nebulization Daily Albino Alu, RPH   175 mcg at 03/18/23 0821   senna (SENOKOT) tablet 8.6 mg  1 tablet Oral BID Albino Alu, RPH   8.6 mg at 03/18/23 8119   sodium chloride  flush (NS) 0.9 % injection 10-40 mL  10-40 mL Intracatheter Q12H [name], MD   30 mL at 03/18/23 0906   sodium chloride  flush (NS) 0.9 % injection 10-40 mL  10-40 mL Intracatheter PRN [name], MD       sorbitol  70 % solution 30 mL  30 mL Oral Once Lee, [place], NP       spironolactone  (ALDACTONE ) tablet 25 mg  25 mg Oral Daily Lee, [place], NP   25 mg at 03/18/23 0905   traMADol  (ULTRAM ) tablet 50-100 mg  50-100 mg Oral Q4H PRN Gold, Wayne E, PA-C   50 mg at 03/18/23 0905   warfarin (COUMADIN ) tablet 2.5 mg  2.5 mg Oral ONCE-1600 Albino Alu, [facility]       Warfarin - Pharmacist Dosing Inpatient   Does not apply q1600 Ladona Pickles, [facility]   Given at 03/15/23 1704   Vitals: Today's Vitals   04/01/23 1422  BP: 110/67  Pulse: 69  Resp: 18  SpO2: 95%  Weight: 124 lb (56.2 kg)  Height: 5\' 1"  (1.549 m)   Body mass index is 23.43 kg/m.   Physical Exam: General: Alert and oriented, no acute distress Neuro: Grossly intact CV: Regular rate and rhythm, no murmur Pulm: Clear to auscultation bilaterally GI: +BS, nontender Extremities: No edema bilaterally Wound: Clean and dry. Healing well without signs of infection  Diagnostic Tests: CLINICAL DATA:  Post CABG   EXAM: CHEST - 2 VIEW   COMPARISON:  03/20/2023   FINDINGS: Post sternotomy changes and valve prosthesis. Mild  cardiomegaly. Resolution of previously noted pleural effusions and pulmonary edema. No pneumothorax   IMPRESSION: Mild cardiomegaly. Resolution of previously noted pleural effusions and pulmonary edema.     Electronically Signed   By: Esmeralda Hedge M.D.   On: 04/01/2023 15:52  Impression/Plan S/P Redo sternotomy,  MVR, TVRepair: Progressing slowly but overall doing well from surgery. She does admit to incisional pain but this has improved greatly with oxycodone . She will continue Tylenol  and Oxycodone  1-3 times per day as needed. She is ambulating with a walker about twice per day in her house and is participating in home health PT. We discussed increasing this to 3 times per day. Her incisions are healing well without sign of infection. She denies further palpitations and seems to be is NSR today. I will not make any medication changes at this time. She was told to hold her coumadin  and eat green vegetables since her INR was elevated, she follows up on Wednesday. She admits to a small amount of shortness of breath with exertion. CXR shows resolved pleural effusions and pulmonary edema, continue Lasix  and Spironolactone . Echocardiogram is on 02/26. We reviewed continue sternal precautions and endocarditis prophylaxis. I will have her return to the clinic with Dr. Honey Lusty in 3 weeks.    Randa Burton, PA-C [facility] [phone]

## 2023-03-18 NOTE — Progress Notes (Signed)
Physical Therapy Treatment Patient Details Name: Penny Mccullough MRN: [id number] DOB: 1945/12/17 Today's Date: 03/18/2023   History of Present Illness 78 y.o. female admitted 03/06/23 for planned MVR, tricuspid valve repair, redo sternotomy. PMH includes CAD s/p CABG (1989), afib on Eliquis, CHF, COPD, CVA, HTN, MI.    PT Comments  Pt demonstrating much improved ambulation and overall activity tolerance. At this time pt continues to benefit from use of RW for ambulation as pt with LOB, instability, and DOE when attempting to amb without AD requiring assist from PT to prevent falling. Acute PT to cont to follow.    If plan is discharge home, recommend the following: A little help with walking and/or transfers;Assistance with cooking/housework;Assist for transportation;Help with stairs or ramp for entrance   Can travel by private vehicle        Equipment Recommendations  Rollator (4 wheels)    Recommendations for Other Services OT consult     Precautions / Restrictions Precautions Precautions: Fall;Sternal Precaution Booklet Issued: No Precaution Comments: pt with good overall adherence to and recall of precautions from training in prior sessions; reviewed precautions throughout session Restrictions Weight Bearing Restrictions Per Provider Order: Yes (sternal) RUE Weight Bearing Per Provider Order: Non weight bearing LUE Weight Bearing Per Provider Order: Non weight bearing Other Position/Activity Restrictions: sternal precautions     Mobility  Bed Mobility               General bed mobility comments: pt received sitting in recliner and returned to recliner    Transfers Overall transfer level: Needs assistance Equipment used: None Transfers: Sit to/from Stand, Bed to chair/wheelchair/BSC Sit to Stand: Contact guard assist           General transfer comment: verbal cues for safe hand placement, contact guard for safety    Ambulation/Gait Ambulation/Gait  assistance: Min assist, Contact guard assist Gait Distance (Feet): 400 Feet Assistive device: Rolling walker (2 wheels), None Gait Pattern/deviations: Step-through pattern, Decreased stride length Gait velocity: improved velocity this session Gait velocity interpretation: 1.31 - 2.62 ft/sec, indicative of limited community ambulator   General Gait Details: pt with increased cadence, no supplemental O2, good walker management. trialed amb without RW for the last 66' however pt very unsteady, staggering to the R reaching for this PT, minA provided to stabilize. Pt with noted DOE when amb without use of RW where pt able to talk and walk when using RW   Stairs             Wheelchair Mobility     Tilt Bed    Modified Rankin (Stroke Patients Only)       Balance Overall balance assessment: Mild deficits observed, not formally tested                                          Cognition Arousal: Alert Behavior During Therapy: [facility] for tasks assessed/performed Overall Cognitive Status: Within Functional Limits for tasks assessed                                 General Comments: AAOx4 and pleasant throughout session. Pt requiring occasional verbal, visual, and tactile cues during session. However, this is appears to be due to pt being [facility] and not related to cognition.        Exercises  General Comments General comments (skin integrity, edema, etc.): VSS      Pertinent Vitals/Pain Pain Assessment Pain Assessment: 0-10 Pain Score: 2  Pain Location: soreness at sternal incision Pain Descriptors / Indicators: Sore    Home Living                          Prior Function            PT Goals (current goals can now be found in the care plan section) Acute Rehab PT Goals Patient Stated Goal: home PT Goal Formulation: With patient Time For Goal Achievement: 03/25/23 Potential to Achieve Goals: Good Progress towards PT goals:  Progressing toward goals    Frequency    Min 3X/week      PT Plan      Co-evaluation              AM-PAC PT "6 Clicks" Mobility   Outcome Measure  Help needed turning from your back to your side while in a flat bed without using bedrails?: None Help needed moving from lying on your back to sitting on the side of a flat bed without using bedrails?: A Little Help needed moving to and from a bed to a chair (including a wheelchair)?: A Little Help needed standing up from a chair using your arms (e.g., wheelchair or bedside chair)?: A Little Help needed to walk in hospital room?: A Little Help needed climbing 3-5 steps with a railing? : A Little 6 Click Score: 19    End of Session   Activity Tolerance: Patient tolerated treatment well Patient left: with call bell/phone within reach;in chair;with nursing/sitter in room Nurse Communication: Mobility status PT Visit Diagnosis: Unsteadiness on feet (R26.81);Muscle weakness (generalized) (M62.81)     Time: 1215-1225 PT Time Calculation (min) (ACUTE ONLY): 10 min  Charges:    $Gait Training: 8-22 mins PT General Charges $$ ACUTE PT VISIT: 1 Visit                     Lewis Shock, PT, DPT Acute Rehabilitation Services Secure chat preferred Office #: [phone]    Iona Hansen 03/18/2023, 1:41 PM

## 2023-03-18 NOTE — H&P (View-Only) (Signed)
Advanced Heart Failure Rounding Note  HF Cardiologist: Dr. Shirlee Latch  Chief Complaint: SOB   Subjective:   1/23: On Milrinone for Co-ox 47.  Co-ox 63. CVP 5. On Milrinone 0.125 mcg/kg/min Remains in AF on Amio 30mg /hr INR now 2.0  Feeling well this morning. Sitting up in chair. Ate breakfast this morning. Mild pain at MSI with mobility bit tolerable. No SOB, CP or dizziness.   Objective:    Weight Range: 60.8 kg Body mass index is 25.33 kg/m.   Vital Signs:   Temp:  [97.5 F (36.4 C)-98 F (36.7 C)] 98 F (36.7 C) (01/27 0700) Pulse Rate:  [69-112] 104 (01/27 0800) Resp:  [9-25] 16 (01/27 0800) BP: (90-118)/(54-95) 106/60 (01/27 0800) SpO2:  [71 %-100 %] 94 % (01/27 0800) Weight:  [60.8 kg] 60.8 kg (01/27 0500) Last BM Date : 03/12/23  Weight change: Filed Weights   03/16/23 0435 03/17/23 0500 03/18/23 0500  Weight: 61.4 kg 60 kg 60.8 kg   Intake/Output:   Intake/Output Summary (Last 24 hours) at 03/18/2023 0935 Last data filed at 03/18/2023 0800 Gross per 24 hour  Intake 853.97 ml  Output 1650 ml  Net -796.03 ml    Physical Exam   CVP 5 General: Well appearing. No distress on RA HEENT: neck supple.   Cardiac: JVP ~8cm. S1 and S2 present. No murmurs or rub. Resp: Coarse, inspiratory wheezes throughout. Abdomen: Soft, non-tender, non-distended. + BS. Extremities: Warm and dry. No rash, cyanosis.  Trace BLE edema.  Neuro: Alert and oriented x3. Affect pleasant. Moves all extremities without difficulty. Lines/Devices:  RUE PICC  Telemetry   AF 90-110s (personally reviewed)  Labs    CBC Recent Labs    03/17/23 0331 03/18/23 0359  WBC 11.2* 10.4  HGB 8.4* 8.1*  HCT 26.0* 25.0*  MCV 93.5 93.6  PLT 393 424*   Basic Metabolic Panel Recent Labs    16/10/96 0438 03/17/23 0410 03/18/23 0359  NA 133* 133* 128*  K 3.6 3.8 3.7  CL 90* 89* 87*  CO2 32 32 30  GLUCOSE 126* 163* 156*  BUN 21 19 19   CREATININE 0.88 0.86 0.99  CALCIUM 7.9* 8.4*  8.1*  MG 2.1  --  2.1   Liver Function Tests No results for input(s): "AST", "ALT", "ALKPHOS", "BILITOT", "PROT", "ALBUMIN" in the last 72 hours. No results for input(s): "LIPASE", "AMYLASE" in the last 72 hours. Cardiac Enzymes No results for input(s): "CKTOTAL", "CKMB", "CKMBINDEX", "TROPONINI" in the last 72 hours.  BNP: BNP (last 3 results) Recent Labs    06/18/22 1458 07/19/22 1227 02/05/23 1130  BNP 363.7* 256.9* 519.0*   ProBNP (last 3 results) Recent Labs    08/15/22 1209  PROBNP 435   D-Dimer No results for input(s): "DDIMER" in the last 72 hours. Hemoglobin A1C No results for input(s): "HGBA1C" in the last 72 hours. Fasting Lipid Panel No results for input(s): "CHOL", "HDL", "LDLCALC", "TRIG", "CHOLHDL", "LDLDIRECT" in the last 72 hours. Thyroid Function Tests No results for input(s): "TSH", "T4TOTAL", "T3FREE", "THYROIDAB" in the last 72 hours.  Invalid input(s): "FREET3"  Other results:  Imaging   No results found.  Medications:    Scheduled Medications:  arformoterol  15 mcg Nebulization BID   aspirin  81 mg Oral Daily   atorvastatin  80 mg Oral Daily   bisacodyl  10 mg Oral Daily   Or   bisacodyl  10 mg Rectal Daily   budesonide (PULMICORT) nebulizer solution  0.5 mg Nebulization BID  Chlorhexidine Gluconate Cloth  6 each Topical Daily   docusate sodium  200 mg Oral Daily   ezetimibe  10 mg Oral Daily   insulin aspart  1-3 Units Subcutaneous TID WC   mouth rinse  15 mL Mouth Rinse 4 times per day   polyethylene glycol  17 g Oral Daily   potassium chloride  20 mEq Oral Q4H   revefenacin  175 mcg Nebulization Daily   senna  1 tablet Oral BID   sodium chloride flush  10-40 mL Intracatheter Q12H   sorbitol  30 mL Oral Once   spironolactone  25 mg Oral Daily   warfarin  2.5 mg Oral ONCE-1600   Warfarin - Pharmacist Dosing Inpatient   Does not apply q1600    Infusions:  albumin human     amiodarone 30 mg/hr (03/18/23 0800)   milrinone  0.125 mcg/kg/min (03/18/23 0800)    PRN Medications: albumin human, albuterol, lactulose, ondansetron, mouth rinse, oxyCODONE, sodium chloride flush, traMADol  Patient Profile   Penny Mccullough is a 78 yo woman with PAF, COPD, CAD s/p previous CABG (LIMA) > LIMA occluded. Patent LAD stents cath 2/24. 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    Underwent MV Replacement/TV Repair and LAA ligation on 03/06/23. Post-op course c/b afib w RVR and biventricular failure R>L (drop in LVEF 40-45%>>25-30%, RV severely reduced).  Assessment/Plan   1. Severe MR/TR - 2/2 rheumatic heart disease  - S/P MV Replacement (bio) TV Repair, LAA - trivial MR on post-op echo - continue to ambulate. Encourage IS   2. PAF - Developed A fib in 2022. Had cardioversion 2023.  - Back in A fib w/ RVR post-op - continue amio gtt at 30/hr  - On warfarin. INR now 2.0 - Plan for DCCV tomorrow now that INR is therapeutic - Discussed warfarin dosing with PharmD personally.    3. Acute Biventricular HFrEF w/ Low Output  - Intra-op TEE EF 40-45% - Post-op Echo EF down to 25-30% with RV severely reduced - A fib RVR likely contributing - Initial low co-ox 49% - On Milrinone 0.125 mcg/kg/min - Co-ox 63. CVP 5. Continue to hold Lasix - GDMT limited currently by soft BP - No BB with Milrinone - Start Spiro 25 mg daily - Plan DCCV tomorrow    4. Acute Hypoxic Respiratory Failure. Resolved.  - COPD at baseline. She does require oxygen at home. - resolved with diuresis   5. Anemia, Expected Blood Loss - hgb slowly trending down, 8.1 today.  - No obvious source of bleeding  6. Hypokalemia/Hypomagnesemia  - K 3.7  supp - start spiro as above  Length of Stay: 63  Penny Ranveer Wahlstrom, NP  03/18/2023, 9:35 AM  Advanced Heart Failure Team Pager [phone] (M-F; 7a - 5p)  Please contact [facility] for night-coverage after hours (5p -7a ) and weekends on [email or web address]

## 2023-03-18 NOTE — Progress Notes (Signed)
      [address]       [place] [address]             [phone]       POD # 12  Worked with PT today  BP 110/67   Pulse 86   Temp 98.3 F (36.8 C) (Oral)   Resp 16   Ht 5\' 1"  (1.549 m)   Wt 60.8 kg   SpO2 (!) 89%   BMI 25.33 kg/m  Rate controlled A fib   Intake/Output Summary (Last 24 hours) at 03/18/2023 1732 Last data filed at 03/18/2023 1400 Gross per 24 hour  Intake 1206.15 ml  Output 1035 ml  Net 171.15 ml   No PM labs  Viviann Spare C. Dorris Fetch, MD [facility] [phone]

## 2023-03-18 NOTE — Progress Notes (Signed)
Advanced Heart Failure Rounding Note  HF Cardiologist: Dr. Shirlee Latch  Chief Complaint: SOB   Subjective:   1/23: On Milrinone for Co-ox 47.  Co-ox 63. CVP 5. On Milrinone 0.125 mcg/kg/min Remains in AF on Amio 30mg /hr INR now 2.0  Feeling well this morning. Sitting up in chair. Ate breakfast this morning. Mild pain at MSI with mobility bit tolerable. No SOB, CP or dizziness.   Objective:    Weight Range: 60.8 kg Body mass index is 25.33 kg/m.   Vital Signs:   Temp:  [97.5 F (36.4 C)-98 F (36.7 C)] 98 F (36.7 C) (01/27 0700) Pulse Rate:  [69-112] 104 (01/27 0800) Resp:  [9-25] 16 (01/27 0800) BP: (90-118)/(54-95) 106/60 (01/27 0800) SpO2:  [71 %-100 %] 94 % (01/27 0800) Weight:  [60.8 kg] 60.8 kg (01/27 0500) Last BM Date : 03/12/23  Weight change: Filed Weights   03/16/23 0435 03/17/23 0500 03/18/23 0500  Weight: 61.4 kg 60 kg 60.8 kg   Intake/Output:   Intake/Output Summary (Last 24 hours) at 03/18/2023 0935 Last data filed at 03/18/2023 0800 Gross per 24 hour  Intake 853.97 ml  Output 1650 ml  Net -796.03 ml    Physical Exam   CVP 5 General: Well appearing. No distress on RA HEENT: neck supple.   Cardiac: JVP ~8cm. S1 and S2 present. No murmurs or rub. Resp: Coarse, inspiratory wheezes throughout. Abdomen: Soft, non-tender, non-distended. + BS. Extremities: Warm and dry. No rash, cyanosis.  Trace BLE edema.  Neuro: Alert and oriented x3. Affect pleasant. Moves all extremities without difficulty. Lines/Devices:  RUE PICC  Telemetry   AF 90-110s (personally reviewed)  Labs    CBC Recent Labs    03/17/23 0331 03/18/23 0359  WBC 11.2* 10.4  HGB 8.4* 8.1*  HCT 26.0* 25.0*  MCV 93.5 93.6  PLT 393 424*   Basic Metabolic Panel Recent Labs    16/10/96 0438 03/17/23 0410 03/18/23 0359  NA 133* 133* 128*  K 3.6 3.8 3.7  CL 90* 89* 87*  CO2 32 32 30  GLUCOSE 126* 163* 156*  BUN 21 19 19   CREATININE 0.88 0.86 0.99  CALCIUM 7.9* 8.4*  8.1*  MG 2.1  --  2.1   Liver Function Tests No results for input(s): "AST", "ALT", "ALKPHOS", "BILITOT", "PROT", "ALBUMIN" in the last 72 hours. No results for input(s): "LIPASE", "AMYLASE" in the last 72 hours. Cardiac Enzymes No results for input(s): "CKTOTAL", "CKMB", "CKMBINDEX", "TROPONINI" in the last 72 hours.  BNP: BNP (last 3 results) Recent Labs    06/18/22 1458 07/19/22 1227 02/05/23 1130  BNP 363.7* 256.9* 519.0*   ProBNP (last 3 results) Recent Labs    08/15/22 1209  PROBNP 435   D-Dimer No results for input(s): "DDIMER" in the last 72 hours. Hemoglobin A1C No results for input(s): "HGBA1C" in the last 72 hours. Fasting Lipid Panel No results for input(s): "CHOL", "HDL", "LDLCALC", "TRIG", "CHOLHDL", "LDLDIRECT" in the last 72 hours. Thyroid Function Tests No results for input(s): "TSH", "T4TOTAL", "T3FREE", "THYROIDAB" in the last 72 hours.  Invalid input(s): "FREET3"  Other results:  Imaging   No results found.  Medications:    Scheduled Medications:  arformoterol  15 mcg Nebulization BID   aspirin  81 mg Oral Daily   atorvastatin  80 mg Oral Daily   bisacodyl  10 mg Oral Daily   Or   bisacodyl  10 mg Rectal Daily   budesonide (PULMICORT) nebulizer solution  0.5 mg Nebulization BID  Chlorhexidine Gluconate Cloth  6 each Topical Daily   docusate sodium  200 mg Oral Daily   ezetimibe  10 mg Oral Daily   insulin aspart  1-3 Units Subcutaneous TID WC   mouth rinse  15 mL Mouth Rinse 4 times per day   polyethylene glycol  17 g Oral Daily   potassium chloride  20 mEq Oral Q4H   revefenacin  175 mcg Nebulization Daily   senna  1 tablet Oral BID   sodium chloride flush  10-40 mL Intracatheter Q12H   sorbitol  30 mL Oral Once   spironolactone  25 mg Oral Daily   warfarin  2.5 mg Oral ONCE-1600   Warfarin - Pharmacist Dosing Inpatient   Does not apply q1600    Infusions:  albumin human     amiodarone 30 mg/hr (03/18/23 0800)   milrinone  0.125 mcg/kg/min (03/18/23 0800)    PRN Medications: albumin human, albuterol, lactulose, ondansetron, mouth rinse, oxyCODONE, sodium chloride flush, traMADol  Patient Profile   Penny Mccullough is a 78 yo woman with PAF, COPD, CAD s/p previous CABG (LIMA) > LIMA occluded. Patent LAD stents cath 2/24. 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    Underwent MV Replacement/TV Repair and LAA ligation on 03/06/23. Post-op course c/b afib w RVR and biventricular failure R>L (drop in LVEF 40-45%>>25-30%, RV severely reduced).  Assessment/Plan   1. Severe MR/TR - 2/2 rheumatic heart disease  - S/P MV Replacement (bio) TV Repair, LAA - trivial MR on post-op echo - continue to ambulate. Encourage IS   2. PAF - Developed A fib in 2022. Had cardioversion 2023.  - Back in A fib w/ RVR post-op - continue amio gtt at 30/hr  - On warfarin. INR now 2.0 - Plan for DCCV tomorrow now that INR is therapeutic - Discussed warfarin dosing with PharmD personally.    3. Acute Biventricular HFrEF w/ Low Output  - Intra-op TEE EF 40-45% - Post-op Echo EF down to 25-30% with RV severely reduced - A fib RVR likely contributing - Initial low co-ox 49% - On Milrinone 0.125 mcg/kg/min - Co-ox 63. CVP 5. Continue to hold Lasix - GDMT limited currently by soft BP - No BB with Milrinone - Start Spiro 25 mg daily - Plan DCCV tomorrow    4. Acute Hypoxic Respiratory Failure. Resolved.  - COPD at baseline. She does require oxygen at home. - resolved with diuresis   5. Anemia, Expected Blood Loss - hgb slowly trending down, 8.1 today.  - No obvious source of bleeding  6. Hypokalemia/Hypomagnesemia  - K 3.7  supp - start spiro as above  Length of Stay: 63  Penny Ranveer Wahlstrom, NP  03/18/2023, 9:35 AM  Advanced Heart Failure Team Pager [phone] (M-F; 7a - 5p)  Please contact [facility] for night-coverage after hours (5p -7a ) and weekends on [email or web address]

## 2023-03-18 NOTE — Inpatient Diabetes Management (Signed)
Inpatient Diabetes Program Recommendations  AACE/ADA: New Consensus Statement on Inpatient Glycemic Control (2015)  Target Ranges:  Prepandial:   less than 140 mg/dL      Peak postprandial:   less than 180 mg/dL (1-2 hours)      Critically ill patients:  140 - 180 mg/dL   Lab Results  Component Value Date   GLUCAP 88 03/18/2023   HGBA1C 6.1 (H) 03/04/2023    Latest Reference Range & Units 03/17/23 06:19 03/17/23 11:23 03/17/23 16:43 03/17/23 22:55 03/17/23 22:58 03/18/23 08:03 03/18/23 11:43  Glucose-Capillary 70 - 99 mg/dL 403 (H) 474 (H) 99 53 (L) 82 142 (H) 88  (H): Data is abnormally high (L): Data is abnormally low  Diabetes history: DM2 Outpatient Diabetes medications: Jardiance 10 mg daily Current orders for Inpatient glycemic control: ICU correction scale 1-3 units tid  Inpatient Diabetes Program Recommendations:   Patient received Novolog 2 units correction on 03/15/23. Had hypoglycemia last hs of 53. Please consider: D/C ICU glycemic orders -Add Novolog 0-6 units tid, 0-5 units hs  Thank you, Darel Hong E. Leslieanne Cobarrubias, RN, MSN, CDCES  Diabetes Coordinator Inpatient Glycemic Control Team Team Pager [phone] (8am-5pm) 03/18/2023 1:40 PM

## 2023-03-18 NOTE — Progress Notes (Signed)
   NAME:  Penny Mccullough, MRN:  [id number], DOB:  19-Jan-1946, LOS: 12 ADMISSION DATE:  03/06/2023, CONSULTATION DATE: 03/06/2023 REFERRING MD: Eugenio Hoes, CHIEF COMPLAINT: Status post mitral valve replacement/tricuspid valve repair  History of Present Illness:  78 year old female with COPD, ex-smoker coronary artery disease status post CABG and MI status post LAD stent, paroxysmal A-fib on Eliquis and amiodarone and rheumatic mitral valve disease who underwent workup for exertional dyspnea, noted to have severe mitral regurgitation and tricuspid regurgitation, today she underwent mitral valve replacement and tricuspid valve repair.  She was transferred to ICU postop, PCCM was consulted for help evaluation medical management  Pertinent  Medical History   Past Medical History:  Diagnosis Date   CAD (coronary artery disease)    CHF (congestive heart failure) (HCC)    COPD (chronic obstructive pulmonary disease) (HCC)    CVA (cerebral vascular accident) (HCC)    Dysrhythmia    A.Fib   Heart murmur    History of kidney stones    HLD (hyperlipidemia)    Hypertension    MI (myocardial infarction) (HCC) 10/2020     Significant Hospital Events: Including procedures, antibiotic start and stop dates in addition to other pertinent events   1/16 MVR (bioprosthetic), TV repair  Interim History / Subjective:  Penny Mccullough denies complaints. She has been up and walking, eating well.  She remains on low dose milrinone & amiodarone infusions.   Objective   Blood pressure 106/60, pulse (!) 104, temperature 98 F (36.7 C), temperature source Oral, resp. rate 16, height 5\' 1"  (1.549 m), weight 60.8 kg, SpO2 94%. CVP:  [5 mmHg-8 mmHg] 5 mmHg      Intake/Output Summary (Last 24 hours) at 03/18/2023 0855 Last data filed at 03/18/2023 0800 Gross per 24 hour  Intake 853.97 ml  Output 1800 ml  Net -946.03 ml   Filed Weights   03/16/23 0435 03/17/23 0500 03/18/23 0500  Weight: 61.4 kg 60 kg 60.8 kg     Examination: Elderly woman sitting up in the chair in NAD [place]/AT, eyes anicteric Breathing comfortably on RA, CTAB S1S2, irreg rhythm, reg rate  Abd nondistended Mild ankle and foot edema Awake, alert, answering questions appropriately Warm, dry, no rashes  Coox 63% Na+ 128 K+ 3.7 BUN 19 Cr 0.99 INR 2.0 WBC 10.4 H/H 8.1/25 Platelets 424 INR 2.0  I/O -0.8L, net -11L for admission  Resolved Hospital Problem list   Postop N/V  Assessment & Plan:  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 EF 45-50% in 9/ 2024 Moderate PH - coumadin dosing per pharmacy; goal INR 2-3 --cardioversion tomorrow; NPO past midnight --serial coox -con't milrinone, heparin -diuresis per cardiology -con't amiodarone -monitor electrolytes; replete as needed   HLD -statin, zetia  Acute respiratory failure with hypoxia, postoperative pulmonary edema- related to cardioplegia/stunning and afib COPD- s/p tx for presumed AECOPD -con't bronchodilators -pulmonary hygiene -monitor off oxygen  Hard of hearing -PT, OT, progressive mobility -delirium precautions  Hypokalemia, resolved -goal K+ >4  Deconditioning -con't progressive mobility  Constipation; last BM 1/20 -sorbitol today -daily lactulose -con't senna and miralax    Steffanie Dunn, DO 03/18/23 8:55 AM [facility]  For contact information, see Amion. If no response to pager, please call PCCM consult pager. After hours, 7PM- 7AM, please call Elink.

## 2023-03-18 NOTE — Progress Notes (Signed)
PHARMACY - ANTICOAGULATION CONSULT NOTE  Pharmacy Consult for warfarin Indication: afib + bioprosthetic mitral valve  Allergies  Allergen Reactions   Codeine Nausea And Vomiting    Patient Measurements: Height: 5\' 1"  (154.9 cm) Weight: 60.8 kg (134 lb 0.6 oz) IBW/kg (Calculated) : 47.8  Vital Signs: Temp: 97.5 F (36.4 C) (01/27 0400) Temp Source: Axillary (01/27 0400) BP: 118/85 (01/27 0700) Pulse Rate: 99 (01/27 0700)  Labs: Recent Labs    03/16/23 0436 03/16/23 0438 03/17/23 0331 03/17/23 0410 03/18/23 0359  HGB 9.0*  --  8.4*  --  8.1*  HCT 27.5*  --  26.0*  --  25.0*  PLT 419*  --  393  --  424*  LABPROT 17.2*  --  18.2*  --  22.5*  INR 1.4*  --  1.5*  --  2.0*  CREATININE  --  0.88  --  0.86 0.99    Estimated Creatinine Clearance: 40.4 mL/min (by C-G formula based on SCr of 0.99 mg/dL).   Medical History: Past Medical History:  Diagnosis Date   CAD (coronary artery disease)    CHF (congestive heart failure) (HCC)    COPD (chronic obstructive pulmonary disease) (HCC)    CVA (cerebral vascular accident) (HCC)    Dysrhythmia    A.Fib   Heart murmur    History of kidney stones    HLD (hyperlipidemia)    Hypertension    MI (myocardial infarction) (HCC) 10/2020    Assessment: 78 yo female previously on apixaban for atrial fibrillation, pharmacy asked to switch to Coumadin for bioprosthetic mitral valve + afib starting 03/09/22.  INR is therapeutic at 2.0 (+0.5) after a modest jump for yesterday. CBC is stable with no signs of bleeding. Patient's appetite is back to baseline as she has eaten the majority of her most recent meals. Will discontinue feeding supplements for now. Amiodarone was switched from PO to IV on 1/23, will monitor therapy with known DDI with warfarin.  Goal of Therapy:  INR 2-3 Monitor platelets by anticoagulation protocol: Yes   Plan:  Coumadin 2.5 mg po x 1 again tonight Monitor daily INR, CBC, and signs of bleeding Monitor  diet, amiodarone administration, and other potential DDI.  Wilmer Floor, PharmD PGY2 Cardiology Pharmacy Resident  03/18/2023 7:29 AM   [facility] pharmacy phone numbers are listed on [email or web address]

## 2023-03-18 NOTE — Progress Notes (Addendum)
[facility] DAILY ICU PROGRESS NOTE                   [address]            [place] [address]          [phone]   12 Days Post-Op Procedure(s) (LRB): REDO STERNOTOMY (N/A) MITRAL VALVE (MV) REPLACEMENT, USING 29 MM MEDTRONIC MOSAIC PORCINE HEART VALVE (N/A) TRANSESOPHAGEAL ECHOCARDIOGRAM (N/A) TRICUSPID VALVE REPAIR, USING 30 MM MC3 ANNULOPLASTY RING  Total Length of Stay:  LOS: 12 days   Subjective: C/o constipation  Objective: Vital signs in last 24 hours: Temp:  [97.5 F (36.4 C)-98 F (36.7 C)] 97.5 F (36.4 C) (01/27 0400) Pulse Rate:  [69-112] 99 (01/27 0700) Cardiac Rhythm: Atrial fibrillation (01/27 0000) Resp:  [9-25] 14 (01/27 0700) BP: (90-118)/(54-95) 118/85 (01/27 0700) SpO2:  [71 %-100 %] 94 % (01/27 0700) Weight:  [60.8 kg] 60.8 kg (01/27 0500)  Filed Weights   03/16/23 0435 03/17/23 0500 03/18/23 0500  Weight: 61.4 kg 60 kg 60.8 kg    Weight change: 0.8 kg   Hemodynamic parameters for last 24 hours: CVP:  [5 mmHg-8 mmHg] 5 mmHg  Intake/Output from previous day: 01/26 0701 - 01/27 0700 In: 955 [P.O.:480; I.V.:475] Out: 1750 [Urine:1750]  Intake/Output this shift: No intake/output data recorded.  Current Meds: Scheduled Meds:  arformoterol  15 mcg Nebulization BID   aspirin  81 mg Oral Daily   atorvastatin  80 mg Oral Daily   bisacodyl  10 mg Oral Daily   Or   bisacodyl  10 mg Rectal Daily   budesonide (PULMICORT) nebulizer solution  0.5 mg Nebulization BID   Chlorhexidine Gluconate Cloth  6 each Topical Daily   docusate sodium  200 mg Oral Daily   ezetimibe  10 mg Oral Daily   insulin aspart  1-3 Units Subcutaneous TID WC   mouth rinse  15 mL Mouth Rinse 4 times per day   pantoprazole  40 mg Oral Daily   polyethylene glycol  17 g Oral Daily   potassium chloride  20 mEq Oral Q4H   revefenacin  175 mcg Nebulization Daily   sodium chloride flush  10-40 mL Intracatheter Q12H   warfarin  2.5 mg Oral ONCE-1600   Warfarin -  Pharmacist Dosing Inpatient   Does not apply q1600   Continuous Infusions:  albumin human     amiodarone 30 mg/hr (03/18/23 0700)   milrinone 0.125 mcg/kg/min (03/18/23 0700)   PRN Meds:.albumin human, albuterol, lactulose, ondansetron, mouth rinse, oxyCODONE, sodium chloride flush, traMADol  General appearance: alert, cooperative, and no distress Heart: irregularly irregular rhythm Lungs: clear to auscultation bilaterally Abdomen: soft, non tender, + BS Extremities: no edema Wound: incis healing well  Lab Results: CBC: Recent Labs    03/17/23 0331 03/18/23 0359  WBC 11.2* 10.4  HGB 8.4* 8.1*  HCT 26.0* 25.0*  PLT 393 424*   BMET:  Recent Labs    03/17/23 0410 03/18/23 0359  NA 133* 128*  K 3.8 3.7  CL 89* 87*  CO2 32 30  GLUCOSE 163* 156*  BUN 19 19  CREATININE 0.86 0.99  CALCIUM 8.4* 8.1*    CMET: Lab Results  Component Value Date   WBC 10.4 03/18/2023   HGB 8.1 (L) 03/18/2023   HCT 25.0 (L) 03/18/2023   PLT 424 (H) 03/18/2023   GLUCOSE 156 (H) 03/18/2023   CHOL 53 03/07/2023   TRIG 42 03/07/2023   HDL 31 (L)  03/07/2023   LDLCALC 14 03/07/2023   ALT 25 03/04/2023   AST 21 03/04/2023   NA 128 (L) 03/18/2023   K 3.7 03/18/2023   CL 87 (L) 03/18/2023   CREATININE 0.99 03/18/2023   BUN 19 03/18/2023   CO2 30 03/18/2023   TSH 3.144 07/19/2022   INR 2.0 (H) 03/18/2023   HGBA1C 6.1 (H) 03/04/2023      PT/INR:  Recent Labs    03/18/23 0359  LABPROT 22.5*  INR 2.0*   Radiology: No results found.   Assessment/Plan: S/P Procedure(s) (LRB): REDO STERNOTOMY (N/A) MITRAL VALVE (MV) REPLACEMENT, USING 29 MM MEDTRONIC MOSAIC PORCINE HEART VALVE (N/A) TRANSESOPHAGEAL ECHOCARDIOGRAM (N/A) TRICUSPID VALVE REPAIR, USING 30 MM MC3 ANNULOPLASTY RING  1 afeb, s BP 90-118, afib w/ HR 60's-110's, gtts amio and milrinone- AHF assisting w/ management- Co-ox 62, poss DCCV  2 O2 sats good on 1 liter, nebs per PCCM 3 weight stable, good UOP 4 BS mostly well  controlled, one low reading of 53- no DM meds preop 5 hyponatremia trending lower, lasix has been stopped, normal renal fxn 6 H/H fairly stable 7 thrombocytosis mild PLT count 424, likely reactive 8 INR 2.0 pharm dosing 9 rehab modalities as able   Rowe Clack PA-C Pager [phone] 03/18/2023 7:45 AM  Agree with above Stable overnight.  Yuya Vanwingerden Keane Scrape

## 2023-03-18 NOTE — Anesthesia Preprocedure Evaluation (Signed)
Anesthesia Evaluation  Patient identified by MRN, date of birth, ID band Patient awake    Reviewed: Allergy & Precautions, H&P , NPO status , Patient's Chart, lab work & pertinent test results  Airway Mallampati: II  TM Distance: >3 FB Neck ROM: Full    Dental  (+) Edentulous Upper, Edentulous Lower   Pulmonary COPD, former smoker   Pulmonary exam normal breath sounds clear to auscultation       Cardiovascular hypertension, + CAD, + Past MI and + CABG  Normal cardiovascular exam+ dysrhythmias Atrial Fibrillation + Valvular Problems/Murmurs MR  Rhythm:Regular Rate:Normal  TEE:  IMPRESSIONS     1. Septal hypokinesis . Left ventricular ejection fraction, by  estimation, is 45 to 50%. The left ventricle has mildly decreased  function. The left ventricular internal cavity size was mildly dilated.  Left ventricular diastolic function could not be  evaluated.   2. Right ventricular systolic function is normal. The right ventricular  size is normal.   3. Left atrial size was severely dilated. No left atrial/left atrial  appendage thrombus was detected.   4. Right atrial size was mildly dilated.   5. Thickened rheumatic valve with severe central MR ERO 0.52 cm2 RV 101  cc Mean gradient across valve 5 peak 13 mmHg at HR 65 bpm. The mitral  valve is rheumatic. Severe mitral valve regurgitation. Mild mitral  stenosis.   6. Tricuspid valve regurgitation is moderate.   7. The aortic valve is tricuspid. There is moderate calcification of the  aortic valve. There is moderate thickening of the aortic valve. Aortic  valve regurgitation is mild. Aortic valve sclerosis is present, with no  evidence of aortic valve stenosis.     Neuro/Psych CVA  negative psych ROS   GI/Hepatic negative GI ROS, Neg liver ROS,,,  Endo/Other  negative endocrine ROS    Renal/GU Renal disease  negative genitourinary   Musculoskeletal negative  musculoskeletal ROS (+)    Abdominal   Peds negative pediatric ROS (+)  Hematology negative hematology ROS (+)   Anesthesia Other Findings   Reproductive/Obstetrics negative OB ROS                             Anesthesia Physical Anesthesia Plan  ASA: 4  Anesthesia Plan: General   Post-op Pain Management:    Induction: Intravenous  PONV Risk Score and Plan: 3 and Treatment may vary due to age or medical condition  Airway Management Planned: Natural Airway, Simple Face Mask and Mask  Additional Equipment: None  Intra-op Plan:   Post-operative Plan: Extubation in OR  Informed Consent: I have reviewed the patients History and Physical, chart, labs and discussed the procedure including the risks, benefits and alternatives for the proposed anesthesia with the patient or authorized representative who has indicated his/her understanding and acceptance.     Dental advisory given  Plan Discussed with: CRNA and Anesthesiologist  Anesthesia Plan Comments:         Anesthesia Quick Evaluation

## 2023-03-18 NOTE — Plan of Care (Signed)
  Problem: Education: Goal: Knowledge of General Education information will improve Description: Including pain rating scale, medication(s)/side effects and non-pharmacologic comfort measures Outcome: Progressing   Problem: Health Behavior/Discharge Planning: Goal: Ability to manage health-related needs will improve Outcome: Progressing   Problem: Clinical Measurements: Goal: Ability to maintain clinical measurements within normal limits will improve Outcome: Progressing Goal: Will remain free from infection Outcome: Progressing Goal: Diagnostic test results will improve Outcome: Progressing Goal: Respiratory complications will improve Outcome: Progressing Goal: Cardiovascular complication will be avoided Outcome: Progressing   Problem: Activity: Goal: Risk for activity intolerance will decrease Outcome: Progressing   Problem: Nutrition: Goal: Adequate nutrition will be maintained Outcome: Progressing   Problem: Coping: Goal: Level of anxiety will decrease Outcome: Progressing   Problem: Elimination: Goal: Will not experience complications related to bowel motility Outcome: Progressing Goal: Will not experience complications related to urinary retention Outcome: Progressing   Problem: Pain Management: Goal: General experience of comfort will improve Outcome: Progressing   Problem: Safety: Goal: Ability to remain free from injury will improve Outcome: Progressing   Problem: Skin Integrity: Goal: Risk for impaired skin integrity will decrease Outcome: Progressing   Problem: Education: Goal: Will demonstrate proper wound care and an understanding of methods to prevent future damage Outcome: Progressing Goal: Knowledge of disease or condition will improve Outcome: Progressing Goal: Knowledge of the prescribed therapeutic regimen will improve Outcome: Progressing Goal: Individualized Educational Video(s) Outcome: Progressing   Problem: Activity: Goal: Risk for  activity intolerance will decrease Outcome: Progressing   Problem: Cardiac: Goal: Will achieve and/or maintain hemodynamic stability Outcome: Progressing   Problem: Clinical Measurements: Goal: Postoperative complications will be avoided or minimized Outcome: Progressing   Problem: Respiratory: Goal: Respiratory status will improve Outcome: Progressing   Problem: Skin Integrity: Goal: Wound healing without signs and symptoms of infection Outcome: Progressing Goal: Risk for impaired skin integrity will decrease Outcome: Progressing   Problem: Urinary Elimination: Goal: Ability to achieve and maintain adequate renal perfusion and functioning will improve Outcome: Progressing   Problem: Education: Goal: Ability to describe self-care measures that may prevent or decrease complications (Diabetes Survival Skills Education) will improve Outcome: Progressing Goal: Individualized Educational Video(s) Outcome: Progressing   Problem: Coping: Goal: Ability to adjust to condition or change in health will improve Outcome: Progressing   Problem: Fluid Volume: Goal: Ability to maintain a balanced intake and output will improve Outcome: Progressing   Problem: Health Behavior/Discharge Planning: Goal: Ability to identify and utilize available resources and services will improve Outcome: Progressing Goal: Ability to manage health-related needs will improve Outcome: Progressing   Problem: Metabolic: Goal: Ability to maintain appropriate glucose levels will improve Outcome: Progressing   Problem: Nutritional: Goal: Maintenance of adequate nutrition will improve Outcome: Progressing Goal: Progress toward achieving an optimal weight will improve Outcome: Progressing   Problem: Skin Integrity: Goal: Risk for impaired skin integrity will decrease Outcome: Progressing   Problem: Tissue Perfusion: Goal: Adequacy of tissue perfusion will improve Outcome: Progressing

## 2023-03-19 ENCOUNTER — Encounter (HOSPITAL_COMMUNITY)
Admission: RE | Disposition: A | Discharge status: Home / Self Care | Payer: Self-pay | Source: Ambulatory Visit | Attending: Thoracic Surgery (Cardiothoracic Vascular Surgery)

## 2023-03-19 ENCOUNTER — Inpatient Hospital Stay (HOSPITAL_COMMUNITY): Payer: 59 | Admitting: Anesthesiology

## 2023-03-19 ENCOUNTER — Encounter (HOSPITAL_COMMUNITY): Payer: Self-pay | Admitting: Cardiology

## 2023-03-19 DIAGNOSIS — I509 Heart failure, unspecified: Secondary | ICD-10-CM

## 2023-03-19 DIAGNOSIS — I5021 Acute systolic (congestive) heart failure: Secondary | ICD-10-CM | POA: Diagnosis not present

## 2023-03-19 DIAGNOSIS — I110 Hypertensive heart disease with heart failure: Secondary | ICD-10-CM

## 2023-03-19 DIAGNOSIS — I480 Paroxysmal atrial fibrillation: Secondary | ICD-10-CM | POA: Diagnosis not present

## 2023-03-19 DIAGNOSIS — Z952 Presence of prosthetic heart valve: Secondary | ICD-10-CM | POA: Diagnosis not present

## 2023-03-19 DIAGNOSIS — I2510 Atherosclerotic heart disease of native coronary artery without angina pectoris: Secondary | ICD-10-CM

## 2023-03-19 DIAGNOSIS — I4891 Unspecified atrial fibrillation: Secondary | ICD-10-CM

## 2023-03-19 DIAGNOSIS — I5082 Biventricular heart failure: Secondary | ICD-10-CM | POA: Diagnosis not present

## 2023-03-19 HISTORY — PX: CARDIOVERSION: EP1203

## 2023-03-19 LAB — GLUCOSE, CAPILLARY
Glucose-Capillary: 105 mg/dL — ABNORMAL HIGH (ref 70–99)
Glucose-Capillary: 92 mg/dL (ref 70–99)
Glucose-Capillary: 132 mg/dL — ABNORMAL HIGH (ref 70–99)
Glucose-Capillary: 135 mg/dL — ABNORMAL HIGH (ref 70–99)

## 2023-03-19 LAB — TYPE AND SCREEN
ABO/RH(D): A NEG
Antibody Screen: NEGATIVE
Unit division: 0
Unit division: 0
Unit division: 0
Unit division: 0
Unit division: 0
Unit division: 0
Unit division: 0
Unit division: 0

## 2023-03-19 LAB — CBC
HCT: 26.4 % — ABNORMAL LOW (ref 36.0–46.0)
Hemoglobin: 8.3 g/dL — ABNORMAL LOW (ref 12.0–15.0)
MCH: 30.1 pg (ref 26.0–34.0)
MCHC: 31.4 g/dL (ref 30.0–36.0)
MCV: 95.7 fL (ref 80.0–100.0)
Platelets: 444 10*3/uL — ABNORMAL HIGH (ref 150–400)
RBC: 2.76 MIL/uL — ABNORMAL LOW (ref 3.87–5.11)
RDW: 15.1 % (ref 11.5–15.5)
WBC: 10.8 10*3/uL — ABNORMAL HIGH (ref 4.0–10.5)
nRBC: 0 % (ref 0.0–0.2)

## 2023-03-19 LAB — BPAM RBC
Blood Product Expiration Date: 202501272359
Blood Product Expiration Date: 202501292359
Blood Product Expiration Date: 202501302359
Blood Product Expiration Date: 202501302359
Blood Product Expiration Date: 202501302359
Blood Product Expiration Date: 202501312359
Blood Product Expiration Date: 202502072359
Blood Product Expiration Date: 202502072359
ISSUE DATE / TIME: 202501150812
ISSUE DATE / TIME: 202501150812
ISSUE DATE / TIME: 202501150812
ISSUE DATE / TIME: 202501150812
ISSUE DATE / TIME: 202501162339
ISSUE DATE / TIME: 202501220815
Unit Type and Rh: 600
Unit Type and Rh: 600
Unit Type and Rh: 600
Unit Type and Rh: 600
Unit Type and Rh: 600
Unit Type and Rh: 600
Unit Type and Rh: 600
Unit Type and Rh: 600

## 2023-03-19 LAB — COOXEMETRY PANEL
Carboxyhemoglobin: 1.8 % — ABNORMAL HIGH (ref 0.5–1.5)
Carboxyhemoglobin: 1.5 % (ref 0.5–1.5)
Methemoglobin: <0.7 % (ref 0.0–1.5)
Methemoglobin: <0.7 % (ref 0.0–1.5)
O2 Saturation: 68.9 %
O2 Saturation: 55 %
Total hemoglobin: 8.6 g/dL — ABNORMAL LOW (ref 12.0–16.0)
Total hemoglobin: 8.7 g/dL — ABNORMAL LOW (ref 12.0–16.0)

## 2023-03-19 LAB — PROTIME-INR
INR: 2.3 — ABNORMAL HIGH (ref 0.8–1.2)
Prothrombin Time: 25.1 s — ABNORMAL HIGH (ref 11.4–15.2)

## 2023-03-19 LAB — BASIC METABOLIC PANEL
Anion gap: 9 (ref 5–15)
BUN: 12 mg/dL (ref 8–23)
CO2: 29 mmol/L (ref 22–32)
Calcium: 8.7 mg/dL — ABNORMAL LOW (ref 8.9–10.3)
Chloride: 97 mmol/L — ABNORMAL LOW (ref 98–111)
Creatinine, Ser: 0.74 mg/dL (ref 0.44–1.00)
GFR, Estimated: >60 mL/min (ref >60)
Glucose, Bld: 106 mg/dL — ABNORMAL HIGH (ref 70–99)
Potassium: 4.7 mmol/L (ref 3.5–5.1)
Sodium: 135 mmol/L (ref 135–145)

## 2023-03-19 LAB — MAGNESIUM: Magnesium: 2.4 mg/dL (ref 1.7–2.4)

## 2023-03-19 SURGERY — CARDIOVERSION (CATH LAB)
Anesthesia: General

## 2023-03-19 MED ORDER — SORBITOL 70 % SOLN
30.0000 mL | Freq: Once | Status: AC
  Administered 2023-03-19: 30 mL via ORAL
  Filled 2023-03-19: qty 30

## 2023-03-19 MED ORDER — SENNA 8.6 MG PO TABS
2.0000 | ORAL_TABLET | Freq: Two times a day (BID) | ORAL | Status: DC
  Filled 2023-03-19 (×3): qty 2

## 2023-03-19 MED ORDER — WARFARIN SODIUM 2.5 MG PO TABS
2.5000 mg | ORAL_TABLET | Freq: Once | ORAL | Status: AC
  Administered 2023-03-19: 2.5 mg via ORAL
  Filled 2023-03-19 (×2): qty 1

## 2023-03-19 MED ORDER — LIDOCAINE 2% (20 MG/ML) 5 ML SYRINGE
INTRAMUSCULAR | Status: DC | PRN
  Administered 2023-03-19: 40 mg via INTRAVENOUS

## 2023-03-19 MED ORDER — PROPOFOL 10 MG/ML IV BOLUS
INTRAVENOUS | Status: DC | PRN
  Administered 2023-03-19: 30 mg via INTRAVENOUS

## 2023-03-19 MED ORDER — POLYETHYLENE GLYCOL 3350 17 G PO PACK
17.0000 g | PACK | Freq: Two times a day (BID) | ORAL | Status: DC
  Administered 2023-03-22: 17 g via ORAL
  Filled 2023-03-19 (×2): qty 1

## 2023-03-19 MED ORDER — LACTULOSE 10 GM/15ML PO SOLN
20.0000 g | Freq: Two times a day (BID) | ORAL | Status: DC
  Administered 2023-03-19: 20 g via ORAL
  Filled 2023-03-19 (×4): qty 30

## 2023-03-19 SURGICAL SUPPLY — 1 items: PAD DEFIB RADIO PHYSIO CONN (PAD) ×1 IMPLANT

## 2023-03-19 NOTE — Transfer of Care (Signed)
Immediate Anesthesia Transfer of Care Note  Patient: Penny Mccullough  Procedure(s) Performed: CARDIOVERSION  Patient Location: PACU and Cath Lab  Anesthesia Type:MAC  Level of Consciousness: awake, alert , and oriented  Airway & Oxygen Therapy: Patient Spontanous Breathing and Patient connected to nasal cannula oxygen  Post-op Assessment: Report given to RN and Post -op Vital signs reviewed and stable  Post vital signs: Reviewed and stable  Last Vitals:  Vitals Value Taken Time  BP    Temp    Pulse    Resp    SpO2      Last Pain:  Vitals:   03/19/23 0817  TempSrc: Temporal  PainSc:       Patients Stated Pain Goal: 0 (03/19/23 0645)  Complications: No notable events documented.

## 2023-03-19 NOTE — Anesthesia Postprocedure Evaluation (Signed)
Anesthesia Post Note  Patient: Penny Mccullough  Procedure(s) Performed: CARDIOVERSION     Patient location during evaluation: PACU Anesthesia Type: General Level of consciousness: awake and alert Pain management: pain level controlled Vital Signs Assessment: post-procedure vital signs reviewed and stable Respiratory status: spontaneous breathing, nonlabored ventilation, respiratory function stable and patient connected to nasal cannula oxygen Cardiovascular status: blood pressure returned to baseline and stable Postop Assessment: no apparent nausea or vomiting Anesthetic complications: no   No notable events documented.  Last Vitals:  Vitals:   03/19/23 1110 03/19/23 1646  BP: (!) 114/59 109/62  Pulse: (!) 59 62  Resp: 18 18  Temp:  36.8 C  SpO2: 94% 95%    Last Pain:  Vitals:   03/19/23 1646  TempSrc: Oral  PainSc:                  Ladena Jacquez

## 2023-03-19 NOTE — Progress Notes (Signed)
Advanced Heart Failure Rounding Note  HF Cardiologist: Dr. Shirlee Latch  Chief Complaint: SOB   Subjective:   1/23: On Milrinone for Co-ox 47. 1/28: DCCV with successful return to sinus rhythm  Coox 68.9, CVP 6, successful DCCV with return to sinus rhythm. Will stop milrinone and repeat coox in AM. Can hopefully transition off amiodarone gtt tomorrow.  No current complaints, doing well. Transfer to the floor.  Objective:    Weight Range: 59.9 kg Body mass index is 24.95 kg/m.   Vital Signs:   Temp:  [97.4 F (36.3 C)-98.3 F (36.8 C)] 97.6 F (36.4 C) (01/28 1105) Pulse Rate:  [55-102] 59 (01/28 1110) Resp:  [14-22] 18 (01/28 1110) BP: (87-138)/(57-102) 114/59 (01/28 1110) SpO2:  [87 %-98 %] 94 % (01/28 1110) Weight:  [59.9 kg] 59.9 kg (01/28 0238) Last BM Date : 03/12/23  Weight change: Filed Weights   03/17/23 0500 03/18/23 0500 03/19/23 0238  Weight: 60 kg 60.8 kg 59.9 kg   Intake/Output:   Intake/Output Summary (Last 24 hours) at 03/19/2023 1220 Last data filed at 03/19/2023 0800 Gross per 24 hour  Intake 378.87 ml  Output 635 ml  Net -256.13 ml    Physical Exam   CVP 6 General: Well appearing. No distress on RA HEENT: neck supple.   Cardiac: JVP not elevated. S1 and S2 present. Systolic murmur. Resp: Coarse, inspiratory wheezes throughout. Abdomen: Soft, non-tender, non-distended. + BS. Extremities: Warm and dry. No rash, cyanosis.  Trace BLE edema.  Neuro: Alert and oriented x3. Affect pleasant. Moves all extremities without difficulty. Lines/Devices:  RUE PICC  Telemetry   Sinus rhythm, 60s-70s  Labs    CBC Recent Labs    03/18/23 0359 03/19/23 0326  WBC 10.4 10.8*  HGB 8.1* 8.3*  HCT 25.0* 26.4*  MCV 93.6 95.7  PLT 424* 444*   Basic Metabolic Panel Recent Labs    53/66/44 0359 03/19/23 0326  NA 128* 135  K 3.7 4.7  CL 87* 97*  CO2 30 29  GLUCOSE 156* 106*  BUN 19 12  CREATININE 0.99 0.74  CALCIUM 8.1* 8.7*  MG 2.1 2.4    Liver Function Tests No results for input(s): "AST", "ALT", "ALKPHOS", "BILITOT", "PROT", "ALBUMIN" in the last 72 hours. No results for input(s): "LIPASE", "AMYLASE" in the last 72 hours. Cardiac Enzymes No results for input(s): "CKTOTAL", "CKMB", "CKMBINDEX", "TROPONINI" in the last 72 hours.  BNP: BNP (last 3 results) Recent Labs    06/18/22 1458 07/19/22 1227 02/05/23 1130  BNP 363.7* 256.9* 519.0*   ProBNP (last 3 results) Recent Labs    08/15/22 1209  PROBNP 435   D-Dimer No results for input(s): "DDIMER" in the last 72 hours. Hemoglobin A1C No results for input(s): "HGBA1C" in the last 72 hours. Fasting Lipid Panel No results for input(s): "CHOL", "HDL", "LDLCALC", "TRIG", "CHOLHDL", "LDLDIRECT" in the last 72 hours. Thyroid Function Tests No results for input(s): "TSH", "T4TOTAL", "T3FREE", "THYROIDAB" in the last 72 hours.  Invalid input(s): "FREET3"  Other results:  Imaging   No results found.  Medications:    Scheduled Medications:  arformoterol  15 mcg Nebulization BID   aspirin  81 mg Oral Daily   atorvastatin  80 mg Oral Daily   bisacodyl  10 mg Oral Daily   Or   bisacodyl  10 mg Rectal Daily   budesonide (PULMICORT) nebulizer solution  0.5 mg Nebulization BID   Chlorhexidine Gluconate Cloth  6 each Topical Daily   docusate sodium  200  mg Oral Daily   ezetimibe  10 mg Oral Daily   insulin aspart  0-5 Units Subcutaneous QHS   insulin aspart  0-6 Units Subcutaneous TID WC   lactulose  20 g Oral BID   polyethylene glycol  17 g Oral BID   revefenacin  175 mcg Nebulization Daily   senna  2 tablet Oral BID   sodium chloride flush  10-40 mL Intracatheter Q12H   sorbitol  30 mL Oral Once   spironolactone  25 mg Oral Daily   warfarin  2.5 mg Oral ONCE-1600   Warfarin - Pharmacist Dosing Inpatient   Does not apply q1600    Infusions:  amiodarone 30 mg/hr (03/19/23 0944)   milrinone 0.125 mcg/kg/min (03/19/23 0944)    PRN  Medications: albuterol, ondansetron, mouth rinse, oxyCODONE, sodium chloride flush, traMADol  Patient Profile   Penny Mccullough is a 78 yo woman with PAF, COPD, CAD s/p previous CABG (LIMA) > LIMA occluded. Patent LAD stents cath 2/24. 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    Underwent MV Replacement/TV Repair and LAA ligation on 03/06/23. Post-op course c/b afib w RVR and biventricular failure R>L (drop in LVEF 40-45%>>25-30%, RV severely reduced).  Assessment/Plan   1. Severe MR/TR - 2/2 rheumatic heart disease  - S/P MV Replacement (bio) TV Repair, LAA - trivial MR on post-op echo - continue to ambulate. Encourage IS   2. PAF - Developed A fib in 2022. Had cardioversion 2023.  - Back in A fib w/ RVR post-op - continue amio gtt at 30/hr  - On warfarin. INR now 2.3 - DCCV 1/28 (LAA ligation with recent surgery) - Discussed warfarin dosing with PharmD personally.    3. Acute Biventricular HFrEF w/ Low Output  - Intra-op TEE EF 40-45% - Post-op Echo EF down to 25-30% with RV severely reduced - A fib RVR likely contributing - Initial low co-ox 49% - Improved with diuresis and milrinone, will stop today after cardioversion as CVP 6, Coox 69 - Holding lasix - Continue Spiro 25 mg daily   4. Acute Hypoxic Respiratory Failure. Resolved.  - COPD at baseline. She does require oxygen at home. - resolved with diuresis   5. Anemia, Expected Blood Loss - 8.3 today.  - No obvious source of bleeding  6. Hypokalemia/Hypomagnesemia  - K 4.7 after starting spironolactone - start spiro as above  Length of Stay: 13  Romie Minus, MD  03/19/2023, 12:20 PM  Advanced Heart Failure Team Pager [phone] (M-F; 7a - 5p)  Please contact [facility] for night-coverage after hours (5p -7a ) and weekends on [email or web address]

## 2023-03-19 NOTE — Progress Notes (Signed)
   03/19/23 1110  Vitals  BP (!) 114/59  MAP (mmHg) 73  BP Location Left Arm  BP Method Automatic  Patient Position (if appropriate) Lying  Pulse Rate (!) 59  ECG Heart Rate (!) 59  Resp 18  MEWS COLOR  MEWS Score Color Green  Oxygen Therapy  SpO2 94 %  O2 Device Room Air  ECG Monitoring  PR interval 0.26  QRS interval 0.06  QT interval 0.45  QTc interval 0.45  CV Strip Heart Rate 59  Cardiac Rhythm SB;Heart block (admit)  Heart Block Type 1st degree AVB  MEWS Score  MEWS Temp 0  MEWS Systolic 0  MEWS Pulse 0  MEWS RR 0  MEWS LOC 0  MEWS Score 0   Patient arrived from cath lab to 4e14, patient placed on monitor , CVP obtained, and CCMD made aware. Patient daughter called per patient request and made aware of patient room. Bed side RN and charge RN aware. Jelitza Manninen, Randall An RN

## 2023-03-19 NOTE — Interval H&P Note (Signed)
History and Physical Interval Note:  03/19/2023 9:49 AM  Penny Mccullough  has presented today for surgery, with the diagnosis of heart failure.  The various methods of treatment have been discussed with the patient and family. After consideration of risks, benefits and other options for treatment, the patient has consented to  Procedure(s): CARDIOVERSION (N/A) as a surgical intervention.  The patient's history has been reviewed, patient examined, no change in status, stable for surgery.  I have reviewed the patient's chart and labs.  Questions were answered to the patient's satisfaction.     Romie Minus

## 2023-03-19 NOTE — CV Procedure (Signed)
   DIRECT CURRENT CARDIOVERSION  NAME:  Penny Mccullough    MRN: [id number] DOB:  [birth year]    ADMIT DATE: 03/06/2023  CARDIOVERSION:     Indications:  Symptomatic Atrial Fibrillation  Informed consent was obtained prior to the procedure. The risks, benefits and alternatives for the procedure were discussed and the patient comprehended these risks.  Risks include, but are not limited to treatment failure, burns to the chest, pain/discomfort, ventricular arrhythmia.   After a procedural time-out, sedation was performed by anesthesia. The patient had the defibrillator pads placed in the anterior and posterior position. Once an appropriate level of sedation was confirmed, the patient was cardioverted x1 with 200J of biphasic synchronized energy.  The patient converted to NSR.  There were no apparent complications.  The patient had normal neuro status and respiratory status post procedure with vitals stable as recorded elsewhere.  Adequate airway was maintained throughout and vital signs monitored per protocol.  COMPLICATIONS:    Complications: No complications Patient tolerated procedure well.  Clearnce Hasten Advanced Heart Failure 12:24 PM

## 2023-03-19 NOTE — TOC Progression Note (Signed)
Transition of Care [facility]) - Progression Note    Patient Details  Name: Penny Mccullough MRN: [id number] Date of Birth: May 19, 1945  Transition of Care [facility]) CM/SW Contact  Nicanor Bake Phone Number: [phone] 03/19/2023, 2:29 PM  Clinical Narrative:   2:29 PM- HF CSW called the pts room to follow up and see if she needs anything or have questions at this time. Pt did not answer the phone. CSW will continue to make attempts to make contact with pts.   TOC will continue following.     Expected Discharge Plan: Home/Self Care Barriers to Discharge: Continued Medical Work up  Expected Discharge Plan and Services   Discharge Planning Services: CM Consult Post Acute Care Choice: Home Health Living arrangements for the past 2 months: Single Family Home                           HH Arranged: PT, OT HH Agency[facility]         Social Determinants of Health (SDOH) Interventions SDOH Screenings   Food Insecurity: No Food Insecurity (03/07/2023)  Housing: Low Risk  (03/07/2023)  Transportation Needs: No Transportation Needs (03/07/2023)  Utilities: Not At Risk (03/07/2023)  Depression (PHQ2-9): Low Risk  (02/16/2021)  Social Connections: Unknown (03/07/2023)  Tobacco Use: Medium Risk (03/06/2023)    Readmission Risk Interventions     No data to display

## 2023-03-19 NOTE — Progress Notes (Addendum)
[address]       [place] [address]             [phone]      13 Days Post-Op Procedure(s) (LRB): REDO STERNOTOMY (N/A) MITRAL VALVE (MV) REPLACEMENT, USING 29 MM MEDTRONIC MOSAIC PORCINE HEART VALVE (N/A) TRANSESOPHAGEAL ECHOCARDIOGRAM (N/A) TRICUSPID VALVE REPAIR, USING 30 MM MC3 ANNULOPLASTY RING Subjective: The patient states she had pain last night but it has resolved this AM. Still no bowel movement.   Objective: Vital signs in last 24 hours: Temp:  [97.6 F (36.4 C)-98.3 F (36.8 C)] 98.2 F (36.8 C) (01/28 0334) Pulse Rate:  [84-104] 85 (01/28 0700) Cardiac Rhythm: Atrial fibrillation (01/28 0331) Resp:  [14-18] 15 (01/28 0700) BP: (87-127)/(57-102) 91/61 (01/28 0700) SpO2:  [87 %-98 %] 90 % (01/28 0700) Weight:  [59.9 kg] 59.9 kg (01/28 0238)  Hemodynamic parameters for last 24 hours: CVP:  [1 mmHg-18 mmHg] 1 mmHg  Intake/Output from previous day: 01/27 0701 - 01/28 0700 In: 825.8 [P.O.:600; I.V.:225.8] Out: 885 [Urine:885] Intake/Output this shift: Total I/O In: 227.7 [I.V.:227.7] Out: -   General appearance: alert, cooperative, and no distress Neurologic: intact Heart: irregularly irregular rhythm Lungs: diminished bibasilar breath sounds Abdomen: soft, non-tender; bowel sounds normal; no masses,  no organomegaly Extremities: extremities normal, atraumatic, no cyanosis or edema Wound: Clean and dry without sign of infection  Lab Results: Recent Labs    03/18/23 0359 03/19/23 0326  WBC 10.4 10.8*  HGB 8.1* 8.3*  HCT 25.0* 26.4*  PLT 424* 444*   BMET:  Recent Labs    03/18/23 0359 03/19/23 0326  NA 128* 135  K 3.7 4.7  CL 87* 97*  CO2 30 29  GLUCOSE 156* 106*  BUN 19 12  CREATININE 0.99 0.74  CALCIUM 8.1* 8.7*    PT/INR:  Recent Labs    03/19/23 0326  LABPROT 25.1*  INR 2.3*   ABG    Component Value Date/Time   PHART 7.373 03/07/2023 0506   HCO3 22.2 03/07/2023 0506   TCO2 23 03/07/2023 0506    ACIDBASEDEF 3.0 (H) 03/07/2023 0506   O2SAT 68.9 03/19/2023 0327   CBG (last 3)  Recent Labs    03/18/23 1554 03/18/23 2109 03/19/23 0617  GLUCAP 117* 104* 105*    Assessment/Plan: S/P Procedure(s) (LRB): REDO STERNOTOMY (N/A) MITRAL VALVE (MV) REPLACEMENT, USING 29 MM MEDTRONIC MOSAIC PORCINE HEART VALVE (N/A) TRANSESOPHAGEAL ECHOCARDIOGRAM (N/A) TRICUSPID VALVE REPAIR, USING 30 MM MC3 ANNULOPLASTY RING  CV: Biventricular HFrEF. Echo 1/21 EF 25-30%. AHF team following, appreciate their recommendations. Hx of afib s/p cardioversion 2023. Afib with RVR likely contributing to HF, INR is at goal so plan for DCCV today. Continue Amiodarone drip. SBP 91 this AM, co-ox 68. On Milrinone 0.134mcg/kg/min and spironolactone, GDMT titration as able but restricted by soft BP.    Pulm: Hx of COPD. Saturating 90s on 1L  at night and RA during the day. Pt reports she does not use oxygen at home. Last CXR 1/22 with stable vascular congestion and small bilateral pleural effusions. Will get 2V CXR as baseline when transferred to 4E.    GI: Last BM 01/21. Continue bowel regimen. Sorbitol today after DCCV. Tolerating a diet.   Endo: Preop A1C 6.1. On Jardiance at home. CBGs controlled on SSI AC/HS.    Renal: Cr 0.74, stable. UO 885cc/24hrs recorded. Under preop weight. Lasix being held per AHF team. K 4.7. Mg 2.4. Electrolytes at goal.  Expected postop ABLA: H/H 8.3/26.4, not clinically significant at this time.   INR: Coumadin per pharmacy. Coumadin 2.5mg  yesterday. INR 2.3, goal 2-3.   Deconditioning: Continue work with PT/OT, recommending HH PT/OT at discharge.    Dispo: Per AHF note yesterday ok for transfer to floor, will put in transfer orders.   LOS: 13 days    Jenny Reichmann, PA-C 03/19/2023  Agree with above Remains stable Floor today  Davelyn Gwinn Keane Scrape

## 2023-03-19 NOTE — Progress Notes (Signed)
OT Cancellation Note  Patient Details Name: Penny Mccullough MRN: [id number] DOB: 1945-03-18   Cancelled Treatment:    Reason Eval/Treat Not Completed: Patient at procedure or test/ unavailable  Tyler Deis, OTR/L [facility] Acute Rehabilitation Office: [phone]   Myrla Halsted 03/19/2023, 9:20 AM

## 2023-03-19 NOTE — Progress Notes (Signed)
Occupational Therapy Treatment Patient Details Name: Penny Mccullough MRN: [id number] DOB: Jul 21, 1945 Today's Date: 03/19/2023   History of present illness 78 y.o. female admitted 03/06/23 for planned MVR, tricuspid valve repair, redo sternotomy. PMH includes CAD s/p CABG (1989), afib on Eliquis, CHF, COPD, CVA, HTN, MI.   OT comments  Pt progressing well toward established OT goals. Pt needing increased time to recall sternal precautions and with good implementation during mobility. Min cues for recall of techniques for implementation of sternal precautions during pericare and UB Adl. Noting DOE 2/4 with functional mobility into hall but SpO2 96%. Pt to continue to benefit from acute OT services and recommending HHOT at discharge.       If plan is discharge home, recommend the following:  A little help with walking and/or transfers;A little help with bathing/dressing/bathroom;Assistance with cooking/housework;Assist for transportation;Help with stairs or ramp for entrance   Equipment Recommendations  None recommended by OT    Recommendations for Other Services      Precautions / Restrictions Precautions Precautions: Fall;Sternal Precaution Booklet Issued: Yes (comment) Precaution Comments: pt with good overall adherence to and recall of precautions with increased time from training in prior sessions; reviewed precautions throughout session Restrictions Weight Bearing Restrictions Per Provider Order: Yes Other Position/Activity Restrictions: sternal precautions       Mobility Bed Mobility Overal bed mobility: Needs Assistance Bed Mobility: Supine to Sit, Sit to Supine     Supine to sit: Min assist Sit to supine: Contact guard assist   General bed mobility comments: light assist for truncal elevation as pt with not quite enough momentum to come to upright    Transfers Overall transfer level: Needs assistance Equipment used: None Transfers: Sit to/from Stand Sit to Stand:  Contact guard assist           General transfer comment: good use of heart pillow. Went to push hips forward 1x with RUE but was able to redirect herself without cues     Balance Overall balance assessment: Mild deficits observed, not formally tested                                         ADL either performed or assessed with clinical judgement   ADL Overall ADL's : Needs assistance/impaired                 Upper Body Dressing : Set up;Sitting Upper Body Dressing Details (indicate cue type and reason): cues for performing within sternal precautions. simulated sititng EOB Lower Body Dressing: Supervision/safety;Sitting/lateral leans Lower Body Dressing Details (indicate cue type and reason): donning socks Toilet Transfer: Contact guard assist           Functional mobility during ADLs: Contact guard assist      Extremity/Trunk Assessment Upper Extremity Assessment Upper Extremity Assessment: Generalized weakness;Right hand dominant   Lower Extremity Assessment Lower Extremity Assessment: Defer to PT evaluation        Vision   Vision Assessment?: No apparent visual deficits   Perception     Praxis      Cognition Arousal: Alert Behavior During Therapy: WFL for tasks assessed/performed Overall Cognitive Status: Within Functional Limits for tasks assessed                                 General Comments: Pt reports some confusion while  hospitalized. Is A&Ox4 but reports she occasionally needs increased time as compared to her normal to keep up with what day it is etc. May benefit from further cognitive assessment. Able to recall sternal precautions with incresaed time        Exercises      Shoulder Instructions       General Comments VSS. pt with 2/4 DOE during hallway mobility but SpO2 96%    Pertinent Vitals/ Pain       Pain Assessment Pain Assessment: Faces Faces Pain Scale: Hurts even more Pain Location:  soreness at sternum/incision site and after cardioversion today Pain Descriptors / Indicators: Sore Pain Intervention(s): Limited activity within patient's tolerance, Monitored during session  Home Living                                          Prior Functioning/Environment              Frequency  Min 1X/week        Progress Toward Goals  OT Goals(current goals can now be found in the care plan section)  Progress towards OT goals: Progressing toward goals  Acute Rehab OT Goals Patient Stated Goal: get better OT Goal Formulation: With patient Time For Goal Achievement: 03/26/23 Potential to Achieve Goals: Good ADL Goals Pt Will Perform Grooming: with supervision;standing Pt Will Perform Upper Body Dressing: sitting;with modified independence Pt Will Perform Lower Body Dressing: with modified independence;sit to/from stand Pt Will Transfer to Toilet: with supervision;ambulating Additional ADL Goal #1: Pt will perform all ADL within sternal precautions without cues  Plan      Co-evaluation                 AM-PAC OT "6 Clicks" Daily Activity     Outcome Measure   Help from another person eating meals?: None Help from another person taking care of personal grooming?: A Little Help from another person toileting, which includes using toliet, bedpan, or urinal?: A Little Help from another person bathing (including washing, rinsing, drying)?: A Little Help from another person to put on and taking off regular upper body clothing?: A Little Help from another person to put on and taking off regular lower body clothing?: A Little 6 Click Score: 19    End of Session Equipment Utilized During Treatment: Gait belt  OT Visit Diagnosis: Unsteadiness on feet (R26.81);Muscle weakness (generalized) (M62.81);Other (comment) (decreased activity tolerance)   Activity Tolerance Patient tolerated treatment well   Patient Left with call bell/phone within  reach;in bed;with bed alarm set   Nurse Communication Mobility status        Time: 0981-1914 OT Time Calculation (min): 25 min  Charges: OT General Charges $OT Visit: 1 Visit OT Treatments $Self Care/Home Management : 23-37 mins  Tyler Deis, OTR/L [facility] Acute Rehabilitation Office: [phone]   Myrla Halsted 03/19/2023, 5:01 PM

## 2023-03-19 NOTE — Plan of Care (Signed)
  Problem: Education: Goal: Knowledge of General Education information will improve Description: Including pain rating scale, medication(s)/side effects and non-pharmacologic comfort measures 03/19/2023 0318 by Delorse Lek, RN Outcome: Progressing 03/19/2023 0318 by Delorse Lek, RN Outcome: Progressing   Problem: Health Behavior/Discharge Planning: Goal: Ability to manage health-related needs will improve 03/19/2023 0318 by Delorse Lek, RN Outcome: Progressing 03/19/2023 0318 by Delorse Lek, RN Outcome: Progressing   Problem: Activity: Goal: Risk for activity intolerance will decrease 03/19/2023 0318 by Delorse Lek, RN Outcome: Progressing 03/19/2023 0318 by Delorse Lek, RN Outcome: Progressing

## 2023-03-19 NOTE — Progress Notes (Signed)
PHARMACY - ANTICOAGULATION CONSULT NOTE  Pharmacy Consult for warfarin Indication: afib + bioprosthetic mitral valve  Allergies  Allergen Reactions   Codeine Nausea And Vomiting    Patient Measurements: Height: 5\' 1"  (154.9 cm) Weight: 59.9 kg (132 lb 0.9 oz) IBW/kg (Calculated) : 47.8  Vital Signs: Temp: 97.4 F (36.3 C) (01/28 0817) Temp Source: Temporal (01/28 0817) BP: 136/77 (01/28 0817) Pulse Rate: 94 (01/28 0817)  Labs: Recent Labs    03/17/23 0331 03/17/23 0410 03/18/23 0359 03/19/23 0326  HGB 8.4*  --  8.1* 8.3*  HCT 26.0*  --  25.0* 26.4*  PLT 393  --  424* 444*  LABPROT 18.2*  --  22.5* 25.1*  INR 1.5*  --  2.0* 2.3*  CREATININE  --  0.86 0.99 0.74    Estimated Creatinine Clearance: 49.7 mL/min (by C-G formula based on SCr of 0.74 mg/dL).   Medical History: Past Medical History:  Diagnosis Date   CAD (coronary artery disease)    CHF (congestive heart failure) (HCC)    COPD (chronic obstructive pulmonary disease) (HCC)    CVA (cerebral vascular accident) (HCC)    Dysrhythmia    A.Fib   Heart murmur    History of kidney stones    HLD (hyperlipidemia)    Hypertension    MI (myocardial infarction) (HCC) 10/2020    Assessment: 78 yo female previously on apixaban for atrial fibrillation, pharmacy asked to switch to Coumadin for bioprosthetic mitral valve + afib starting 03/09/22.  INR is therapeutic at 2.3 (+0.3) after a modest jump for yesterday. CBC is stable with no signs of bleeding. Patient's appetite is back to baseline as she has eaten the majority of her most recent meals with no feeding supplement shakes. Amiodarone was switched from PO to IV on 1/23, will monitor therapy with known DDI with warfarin. Patient undergoing DCCV today.  Goal of Therapy:  INR 2-3 Monitor platelets by anticoagulation protocol: Yes   Plan:  Coumadin 2.5 mg po x 1 again tonight Monitor daily INR, CBC, and signs of bleeding Monitor diet, amiodarone  administration, and other potential DDI.  Wilmer Floor, PharmD PGY2 Cardiology Pharmacy Resident  03/19/2023 8:39 AM   [facility] pharmacy phone numbers are listed on [email or web address]

## 2023-03-20 ENCOUNTER — Inpatient Hospital Stay (HOSPITAL_COMMUNITY): Payer: 59

## 2023-03-20 DIAGNOSIS — I5082 Biventricular heart failure: Secondary | ICD-10-CM | POA: Diagnosis not present

## 2023-03-20 DIAGNOSIS — I480 Paroxysmal atrial fibrillation: Secondary | ICD-10-CM | POA: Diagnosis not present

## 2023-03-20 DIAGNOSIS — Z952 Presence of prosthetic heart valve: Secondary | ICD-10-CM | POA: Diagnosis not present

## 2023-03-20 DIAGNOSIS — I5021 Acute systolic (congestive) heart failure: Secondary | ICD-10-CM | POA: Diagnosis not present

## 2023-03-20 LAB — CBC
HCT: 24.2 % — ABNORMAL LOW (ref 36.0–46.0)
Hemoglobin: 7.8 g/dL — ABNORMAL LOW (ref 12.0–15.0)
MCH: 30.1 pg (ref 26.0–34.0)
MCHC: 32.2 g/dL (ref 30.0–36.0)
MCV: 93.4 fL (ref 80.0–100.0)
Platelets: 431 10*3/uL — ABNORMAL HIGH (ref 150–400)
RBC: 2.59 MIL/uL — ABNORMAL LOW (ref 3.87–5.11)
RDW: 15 % (ref 11.5–15.5)
WBC: 9.3 10*3/uL (ref 4.0–10.5)
nRBC: 0 % (ref 0.0–0.2)

## 2023-03-20 LAB — BASIC METABOLIC PANEL
Anion gap: 9 (ref 5–15)
Anion gap: 9 (ref 5–15)
BUN: 14 mg/dL (ref 8–23)
BUN: 16 mg/dL (ref 8–23)
CO2: 28 mmol/L (ref 22–32)
CO2: 26 mmol/L (ref 22–32)
Calcium: 8.2 mg/dL — ABNORMAL LOW (ref 8.9–10.3)
Calcium: 8.2 mg/dL — ABNORMAL LOW (ref 8.9–10.3)
Chloride: 96 mmol/L — ABNORMAL LOW (ref 98–111)
Chloride: 98 mmol/L (ref 98–111)
Creatinine, Ser: 1.09 mg/dL — ABNORMAL HIGH (ref 0.44–1.00)
Creatinine, Ser: 1 mg/dL (ref 0.44–1.00)
GFR, Estimated: 53 mL/min — ABNORMAL LOW (ref >60)
GFR, Estimated: 58 mL/min — ABNORMAL LOW (ref >60)
Glucose, Bld: 105 mg/dL — ABNORMAL HIGH (ref 70–99)
Glucose, Bld: 123 mg/dL — ABNORMAL HIGH (ref 70–99)
Potassium: 4 mmol/L (ref 3.5–5.1)
Potassium: 3.9 mmol/L (ref 3.5–5.1)
Sodium: 133 mmol/L — ABNORMAL LOW (ref 135–145)
Sodium: 133 mmol/L — ABNORMAL LOW (ref 135–145)

## 2023-03-20 LAB — COOXEMETRY PANEL
Carboxyhemoglobin: 1.8 % — ABNORMAL HIGH (ref 0.5–1.5)
Carboxyhemoglobin: 1.6 % — ABNORMAL HIGH (ref 0.5–1.5)
Methemoglobin: <0.7 % (ref 0.0–1.5)
Methemoglobin: <0.7 % (ref 0.0–1.5)
O2 Saturation: 50.1 %
O2 Saturation: 61.2 %
Total hemoglobin: 8.2 g/dL — ABNORMAL LOW (ref 12.0–16.0)
Total hemoglobin: 8.5 g/dL — ABNORMAL LOW (ref 12.0–16.0)

## 2023-03-20 LAB — MAGNESIUM: Magnesium: 2.3 mg/dL (ref 1.7–2.4)

## 2023-03-20 LAB — GLUCOSE, CAPILLARY
Glucose-Capillary: 87 mg/dL (ref 70–99)
Glucose-Capillary: 159 mg/dL — ABNORMAL HIGH (ref 70–99)
Glucose-Capillary: 91 mg/dL (ref 70–99)
Glucose-Capillary: 106 mg/dL — ABNORMAL HIGH (ref 70–99)

## 2023-03-20 LAB — PROTIME-INR
INR: 2.3 — ABNORMAL HIGH (ref 0.8–1.2)
Prothrombin Time: 25.6 s — ABNORMAL HIGH (ref 11.4–15.2)

## 2023-03-20 MED ORDER — WARFARIN SODIUM 2 MG PO TABS
4.0000 mg | ORAL_TABLET | Freq: Once | ORAL | Status: AC
  Administered 2023-03-20: 4 mg via ORAL
  Filled 2023-03-20: qty 2

## 2023-03-20 NOTE — Progress Notes (Signed)
CARDIAC REHAB PHASE I   Pt oob sitting at sink, just finishing wash up assisted by nursing student. Pt reports feeling well, only complaint is fatigue. Pt reports eagerness to continue mobility to combat fatigue. Pt would like to rest a little and will walk with nursing student later this am. Encouraged mobility, PO intake and frequent IS use. Will continue to follow.    [id number] Woodroe Chen, RN BSN 03/20/2023 10:59 AM

## 2023-03-20 NOTE — Progress Notes (Signed)
PHARMACY - ANTICOAGULATION CONSULT NOTE  Pharmacy Consult for warfarin Indication: afib + bioprosthetic mitral valve  Allergies  Allergen Reactions   Codeine Nausea And Vomiting    Patient Measurements: Height: 5\' 1"  (154.9 cm) Weight: 59.9 kg (132 lb 0.9 oz) IBW/kg (Calculated) : 47.8  Vital Signs: Temp: 97.5 F (36.4 C) (01/29 0731) Temp Source: Oral (01/29 0731) BP: 123/63 (01/29 0731) Pulse Rate: 52 (01/29 0731)  Labs: Recent Labs    03/18/23 0359 03/19/23 0326 03/20/23 0434  HGB 8.1* 8.3* 7.8*  HCT 25.0* 26.4* 24.2*  PLT 424* 444* 431*  LABPROT 22.5* 25.1* 25.6*  INR 2.0* 2.3* 2.3*  CREATININE 0.99 0.74 1.09*    Estimated Creatinine Clearance: 36.5 mL/min (A) (by C-G formula based on SCr of 1.09 mg/dL (H)).   Medical History: Past Medical History:  Diagnosis Date   CAD (coronary artery disease)    CHF (congestive heart failure) (HCC)    COPD (chronic obstructive pulmonary disease) (HCC)    CVA (cerebral vascular accident) (HCC)    Dysrhythmia    A.Fib   Heart murmur    History of kidney stones    HLD (hyperlipidemia)    Hypertension    MI (myocardial infarction) (HCC) 10/2020    Assessment: 78 yo female previously on apixaban for atrial fibrillation, pharmacy asked to switch to Coumadin for bioprosthetic mitral valve + afib starting 03/09/22.  INR is therapeutic and unchanged at 2.3 (+/- 0). CBC is stable with no signs of bleeding. Patient's appetite is back to baseline as she has eaten the majority of her most recent meals with no feeding supplement shakes. Amiodarone was switched from PO to IV on 1/23, will monitor therapy with known DDI with warfarin. Patient underwent DCCV 1/28.  Goal of Therapy:  INR 2-3 Monitor platelets by anticoagulation protocol: Yes   Plan:  Coumadin 4 mg po x 1 again tonight Monitor daily INR, CBC, and signs of bleeding Monitor diet, amiodarone administration, and other potential DDI.  Wilmer Floor, PharmD PGY2  Cardiology Pharmacy Resident  03/20/2023 9:08 AM   [facility] pharmacy phone numbers are listed on [email or web address]

## 2023-03-20 NOTE — Progress Notes (Signed)
1 Day Post-Op Procedure(s) (LRB): CARDIOVERSION (N/A) Subjective: Feels a bit better, still weak  Objective: Vital signs in last 24 hours: Temp:  [97.4 F (36.3 C)-98.3 F (36.8 C)] 97.5 F (36.4 C) (01/29 0400) Pulse Rate:  [55-102] 62 (01/28 1646) Cardiac Rhythm: Heart block (01/28 1901) Resp:  [16-22] 18 (01/28 1646) BP: (109-138)/(59-91) 109/62 (01/28 1646) SpO2:  [91 %-96 %] 95 % (01/28 1646)  Hemodynamic parameters for last 24 hours: CVP:  [4 mmHg-8 mmHg] 6 mmHg  Intake/Output from previous day: 01/28 0701 - 01/29 0700 In: 498.7 [I.V.:498.7] Out: -  Intake/Output this shift: No intake/output data recorded.  General appearance: alert, cooperative, and no distress Heart: regular rate and rhythm and brady Lungs: dim in bases Abdomen: benign Extremities: no edema Wound: incis healing well  Lab Results: Recent Labs    03/19/23 0326 03/20/23 0434  WBC 10.8* 9.3  HGB 8.3* 7.8*  HCT 26.4* 24.2*  PLT 444* 431*   BMET:  Recent Labs    03/19/23 0326 03/20/23 0434  NA 135 133*  K 4.7 4.0  CL 97* 96*  CO2 29 28  GLUCOSE 106* 105*  BUN 12 14  CREATININE 0.74 1.09*  CALCIUM 8.7* 8.2*    PT/INR:  Recent Labs    03/20/23 0434  LABPROT 25.6*  INR 2.3*   ABG    Component Value Date/Time   PHART 7.373 03/07/2023 0506   HCO3 22.2 03/07/2023 0506   TCO2 23 03/07/2023 0506   ACIDBASEDEF 3.0 (H) 03/07/2023 0506   O2SAT 50.1 03/20/2023 0434   CBG (last 3)  Recent Labs    03/19/23 1645 03/19/23 2124 03/20/23 0646  GLUCAP 132* 135* 87    Meds Scheduled Meds:  arformoterol  15 mcg Nebulization BID   aspirin  81 mg Oral Daily   atorvastatin  80 mg Oral Daily   bisacodyl  10 mg Oral Daily   Or   bisacodyl  10 mg Rectal Daily   budesonide (PULMICORT) nebulizer solution  0.5 mg Nebulization BID   Chlorhexidine Gluconate Cloth  6 each Topical Daily   docusate sodium  200 mg Oral Daily   ezetimibe  10 mg Oral Daily   insulin aspart  0-5 Units  Subcutaneous QHS   insulin aspart  0-6 Units Subcutaneous TID WC   lactulose  20 g Oral BID   polyethylene glycol  17 g Oral BID   revefenacin  175 mcg Nebulization Daily   senna  2 tablet Oral BID   sodium chloride flush  10-40 mL Intracatheter Q12H   spironolactone  25 mg Oral Daily   Warfarin - Pharmacist Dosing Inpatient   Does not apply q1600   Continuous Infusions:  amiodarone 30 mg/hr (03/19/23 2249)   PRN Meds:.albuterol, ondansetron, mouth rinse, oxyCODONE, sodium chloride flush, traMADol  Xrays EP STUDY Result Date: 03/19/2023 See surgical note for result.   Assessment/Plan: S/P Procedure(s) (LRB): CARDIOVERSION (N/A) POD#14  1 afeb, s BP 100's-130's, HR 50's-100's. Sinus brady, most recent CVP- 6, current gtts - amio- AHF trying to transition to GDMT as able, Co-ox 50.1 2 O2 sats good on RA 3 voiding- not measured 4 + BM 5 CBG well controlled 6 mild hyponatremia, sodium 133, creat up slightly to 1.09. on spiro, no lasix 7 H/H lower, 7.8/24.2 today, cont to monitor 8 CXR some vasc congestion, basilar ASD, small effus 9 HHPT and OT recommended, face to face complete  LOS: 14 days    Rowe Clack 03/20/2023

## 2023-03-20 NOTE — Progress Notes (Signed)
Repeat co-ox stable at 61%. SCr stable at 1.0.   No plans to restart milrinone.   Robbie Lis, PA-C   03/20/2023

## 2023-03-20 NOTE — Progress Notes (Addendum)
Advanced Heart Failure Rounding Note  HF Cardiologist: Dr. Shirlee Latch  Chief Complaint: SOB   Subjective:   1/23: On Milrinone for Co-ox 47. 1/28: DCCV with successful return to sinus rhythm. Milrinone stopped   Maintaining SR, SB low 50s. On amio gtt at 30/hr   Co-ox back down off milrinone, 50% today (Hgb 7.8). Also w/ bump in SCr from 0.74>>1.09. Calculated FICK CI 2.3.    CVP 8  Denies CP. Notes slight dyspnea w/ ambulation. Appeitite is good. Had small BM this morning but still feels constipated.   Objective:    Weight Range: 59.9 kg Body mass index is 24.95 kg/m.   Vital Signs:   Temp:  [97.4 F (36.3 C)-98.3 F (36.8 C)] 97.5 F (36.4 C) (01/29 0731) Pulse Rate:  [52-65] 52 (01/29 0731) Resp:  [15-18] 15 (01/29 0731) BP: (109-123)/(59-63) 123/63 (01/29 0731) SpO2:  [92 %-96 %] 92 % (01/29 0731) Last BM Date : 03/20/23  Weight change: Filed Weights   03/17/23 0500 03/18/23 0500 03/19/23 0238  Weight: 60 kg 60.8 kg 59.9 kg   Intake/Output:   Intake/Output Summary (Last 24 hours) at 03/20/2023 0831 Last data filed at 03/20/2023 0000 Gross per 24 hour  Intake 252.37 ml  Output --  Net 252.37 ml    Physical Exam   CVP 8  General:  elderly F, fatigued appearing. No respiratory difficulty HEENT: normal Neck: supple. JVD 7 cm. Carotids 2+ bilat; no bruits. No lymphadenopathy or thyromegaly appreciated. Cor: PMI nondisplaced. Regular rhythm and slow rate. No rubs, gallops or murmurs. Sternal wound ok  Lungs: decreased BS at the bases bilaterally  Abdomen: soft, nontender, nondistended. No hepatosplenomegaly. No bruits or masses. Good bowel sounds. Extremities: no cyanosis, clubbing, rash, edema Neuro: alert & oriented x 3, cranial nerves grossly intact. moves all 4 extremities w/o difficulty. Affect pleasant.   Telemetry   Sinus bradycardia, 53 bpm   Labs    CBC Recent Labs    03/19/23 0326 03/20/23 0434  WBC 10.8* 9.3  HGB 8.3* 7.8*  HCT 26.4*  24.2*  MCV 95.7 93.4  PLT 444* 431*   Basic Metabolic Panel Recent Labs    02/72/53 0326 03/20/23 0434  NA 135 133*  K 4.7 4.0  CL 97* 96*  CO2 29 28  GLUCOSE 106* 105*  BUN 12 14  CREATININE 0.74 1.09*  CALCIUM 8.7* 8.2*  MG 2.4 2.3   Liver Function Tests No results for input(s): "AST", "ALT", "ALKPHOS", "BILITOT", "PROT", "ALBUMIN" in the last 72 hours. No results for input(s): "LIPASE", "AMYLASE" in the last 72 hours. Cardiac Enzymes No results for input(s): "CKTOTAL", "CKMB", "CKMBINDEX", "TROPONINI" in the last 72 hours.  BNP: BNP (last 3 results) Recent Labs    06/18/22 1458 07/19/22 1227 02/05/23 1130  BNP 363.7* 256.9* 519.0*   ProBNP (last 3 results) Recent Labs    08/15/22 1209  PROBNP 435   D-Dimer No results for input(s): "DDIMER" in the last 72 hours. Hemoglobin A1C No results for input(s): "HGBA1C" in the last 72 hours. Fasting Lipid Panel No results for input(s): "CHOL", "HDL", "LDLCALC", "TRIG", "CHOLHDL", "LDLDIRECT" in the last 72 hours. Thyroid Function Tests No results for input(s): "TSH", "T4TOTAL", "T3FREE", "THYROIDAB" in the last 72 hours.  Invalid input(s): "FREET3"  Other results:  Imaging   EP STUDY Result Date: 03/19/2023 See surgical note for result.   Medications:    Scheduled Medications:  arformoterol  15 mcg Nebulization BID   aspirin  81 mg Oral  Daily   atorvastatin  80 mg Oral Daily   bisacodyl  10 mg Oral Daily   Or   bisacodyl  10 mg Rectal Daily   budesonide (PULMICORT) nebulizer solution  0.5 mg Nebulization BID   Chlorhexidine Gluconate Cloth  6 each Topical Daily   docusate sodium  200 mg Oral Daily   ezetimibe  10 mg Oral Daily   insulin aspart  0-5 Units Subcutaneous QHS   insulin aspart  0-6 Units Subcutaneous TID WC   lactulose  20 g Oral BID   polyethylene glycol  17 g Oral BID   revefenacin  175 mcg Nebulization Daily   senna  2 tablet Oral BID   sodium chloride flush  10-40 mL Intracatheter  Q12H   spironolactone  25 mg Oral Daily   Warfarin - Pharmacist Dosing Inpatient   Does not apply q1600    Infusions:  amiodarone 30 mg/hr (03/19/23 2249)    PRN Medications: albuterol, ondansetron, mouth rinse, oxyCODONE, sodium chloride flush, traMADol  Patient Profile   Penny Mccullough is a 78 yo woman with PAF, COPD, CAD s/p previous CABG (LIMA) > LIMA occluded. Patent LAD stents cath 2/24. 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    Underwent MV Replacement/TV Repair and LAA ligation on 03/06/23. Post-op course c/b afib w RVR and biventricular failure R>L (drop in LVEF 40-45%>>25-30%, RV severely reduced).  Assessment/Plan   1. Severe MR/TR - 2/2 rheumatic heart disease  - S/P MV Replacement (bio) TV Repair, LAA - trivial MR on post-op echo - continue to ambulate. Encourage IS   2. PAF - Developed A fib in 2022. Had cardioversion 2023.  - Back in A fib w/ RVR post-op - DCCV 1/28>>SB (LAA ligation with recent surgery) - continue amio gtt at 30/hr for now (may need to restart milrinone)  - On warfarin. INR now 2.3   3. Acute Biventricular HFrEF w/ Low Output  - Intra-op TEE EF 40-45% - Post-op Echo EF down to 25-30% with RV severely reduced - A fib RVR likely contributing - Initial low co-ox 49% - Improved with diuresis and milrinone, successful DCCV 1/28  - Milrinone stopped 1/28. Co-ox down to 50% today. SCr up 0.74>>1.09. FICK CI also marginal at 2.3. CVP 8    - monitor closely today. Hopefully will bounce back post cardioversion. Hold off on restarting milrinone for now. Plan repeat co-ox check and BMP this afternoon and will calculate another FICK CI.   - Hold lasix today  - avoiding dig w/ bradycardia, fragility and AKI  - Continue Spiro 25 mg daily   4. Acute Hypoxic Respiratory Failure. Resolved.  - COPD at baseline. She does require oxygen at home. - resolved with diuresis   5. Anemia, Expected Blood Loss - Hgb 7.8  - No obvious source of bleeding - transfusion threshold  per CT surgery   6. Hypokalemia/Hypomagnesemia  - resolved - K 4.0, Mg 2.3 today   7. AKI  - SCr 0.74>>1.09  - in setting of low Co-ox/ marginal CI  - repeat PM Co-ox/BMP. Low threshold to restart low dose milrinone if worsens    Length of Stay: [address] PA-C  03/20/2023, 8:31 AM  Advanced Heart Failure Team Pager [phone] (M-F; 7a - 5p)  Please contact [facility] for night-coverage after hours (5p -7a ) and weekends on [email or web address]  Patient seen and examined with the above-signed Advanced Practice Provider and/or Housestaff. I personally reviewed laboratory data, imaging studies and relevant notes. I independently  examined the patient and formulated the important aspects of the plan. I have edited the note to reflect any of my changes or salient points. I have personally discussed the plan with the patient and/or family.  Remains in NSR after DC-CV yesterday. Milrinone off x 24 hours. Feels ok. Mild DOE. CVP 8 co-ox 50%  General:  Elderly hard of hearing . No resp difficulty HEENT: normal Neck: supple. JVP 8. Carotids 2+ bilat; no bruits. No lymphadenopathy or thryomegaly appreciated. Cor: Sternal wound ok Regular rate & rhythm. No rubs, gallops or murmurs. Lungs: decreased  Abdomen: soft, nontender, nondistended. No hepatosplenomegaly. No bruits or masses. Good bowel sounds. Extremities: no cyanosis, clubbing, rash, edema Neuro: alert & orientedx3, cranial nerves grossly intact. moves all 4 extremities w/o difficulty. Affect pleasant  Remains in NSR but co-ox marginal in setting of post-op RV failure. Continue to follow off milrinone. If co-ox dropping may need to restart milrinone with slower taper. INR 2.3 Discussed warfarin dosing with PharmD personally.  Arvilla Meres, MD  10:08 AM

## 2023-03-20 NOTE — Progress Notes (Signed)
Physical Therapy Treatment Patient Details Name: Penny Mccullough MRN: [id number] DOB: Jul 01, 1945 Today's Date: 03/20/2023   History of Present Illness 78 y.o. female admitted 03/06/23 for planned MVR, tricuspid valve repair, redo sternotomy. PMH includes CAD s/p CABG (1989), afib on Eliquis, CHF, COPD, CVA, HTN, MI.    PT Comments  Pt is slowly progressing towards goals. Improved adherence to sternal precautions; with need for cues < 25% of the time to maintain. Pt was fatigued today and only able to perform home distance gait at [facility] w/HHA. Due to pt current functional status, home set up and available assistance at home recommending skilled physical therapy services 3x/week in order to address strength, balance and functional mobility to decrease risk for falls, injury and re-hospitalization.       If plan is discharge home, recommend the following: A little help with walking and/or transfers;Assistance with cooking/housework;Assist for transportation;Help with stairs or ramp for entrance     Equipment Recommendations  Rollator (4 wheels)       Precautions / Restrictions Precautions Precautions: Fall;Sternal Precaution Booklet Issued: No Precaution Comments: pt with good overall adherence to and recall of precautions with increased time from training in prior sessions; reviewed precautions throughout session Restrictions Weight Bearing Restrictions Per Provider Order: Yes RUE Weight Bearing Per Provider Order: Non weight bearing LUE Weight Bearing Per Provider Order: Non weight bearing Other Position/Activity Restrictions: sternal precautions     Mobility  Bed Mobility Overal bed mobility: Needs Assistance Bed Mobility: Supine to Sit, Sit to Supine     Supine to sit: Contact guard Sit to supine: Min assist   General bed mobility comments: supine to sit CGA with verbal cues 1x for maintaining precautions. Min A at bil LE for sitting to supine.    Transfers Overall transfer  level: Needs assistance Equipment used: None Transfers: Sit to/from Stand Sit to Stand: Contact guard assist           General transfer comment: good use of heart pillow; no cues necessary, CGA for safety    Ambulation/Gait Ambulation/Gait assistance: Contact guard assist Gait Distance (Feet): 120 Feet Assistive device: 1 person hand held assist Gait Pattern/deviations: Step-through pattern, Decreased stride length Gait velocity: decreased Gait velocity interpretation: <1.31 ft/sec, indicative of household ambulator   General Gait Details: Pt had decreased speed for gait today. Pt was short of breathe with respirations up to 35 breathes per min. Requiring ~ 1 min rest break. Initially pt wanted to place hand on IV pole but bringing out to side and not adhere to sternal precautions. Instead did HHA. Pt did not require any physical assistance.      Balance Overall balance assessment: Mild deficits observed, not formally tested      Cognition Arousal: Alert Behavior During Therapy: WFL for tasks assessed/performed Overall Cognitive Status: Within Functional Limits for tasks assessed               General Comments General comments (skin integrity, edema, etc.): Pt HR 65 bpm after gait. Breathes per min up to 35. Cues for long, slow deep breathes.      Pertinent Vitals/Pain Pain Assessment Pain Assessment: 0-10 Pain Score: 6  Pain Location: soreness at sternum/incision site Pain Descriptors / Indicators: Sore Pain Intervention(s): Limited activity within patient's tolerance, Monitored during session     PT Goals (current goals can now be found in the care plan section) Acute Rehab PT Goals Patient Stated Goal: home PT Goal Formulation: With patient Time For Goal Achievement: 03/25/23  Potential to Achieve Goals: Good Progress towards PT goals: Progressing toward goals    Frequency    Min 3X/week      PT Plan  Continue with current POC        AM-PAC  PT "6 Clicks" Mobility   Outcome Measure  Help needed turning from your back to your side while in a flat bed without using bedrails?: None Help needed moving from lying on your back to sitting on the side of a flat bed without using bedrails?: A Little Help needed moving to and from a bed to a chair (including a wheelchair)?: A Little Help needed standing up from a chair using your arms (e.g., wheelchair or bedside chair)?: A Little Help needed to walk in hospital room?: A Little Help needed climbing 3-5 steps with a railing? : A Little 6 Click Score: 19    End of Session Equipment Utilized During Treatment: Gait belt Activity Tolerance: Patient tolerated treatment well;Patient limited by fatigue Patient left: with call bell/phone within reach;in bed Nurse Communication: Mobility status PT Visit Diagnosis: Unsteadiness on feet (R26.81);Muscle weakness (generalized) (M62.81)     Time: 1610-9604 PT Time Calculation (min) (ACUTE ONLY): 14 min  Charges:    $Therapeutic Activity: 8-22 mins PT General Charges $$ ACUTE PT VISIT: 1 Visit                     Harrel Carina, DPT, CLT  Acute Rehabilitation Services Office: [phone] (Secure chat preferred)    [name] 03/20/2023, 2:20 PM

## 2023-03-20 NOTE — Plan of Care (Signed)
Problem: Activity: Goal: Risk for activity intolerance will decrease Outcome: Progressing

## 2023-03-21 DIAGNOSIS — Z952 Presence of prosthetic heart valve: Secondary | ICD-10-CM | POA: Diagnosis not present

## 2023-03-21 LAB — GLUCOSE, CAPILLARY
Glucose-Capillary: 102 mg/dL — ABNORMAL HIGH (ref 70–99)
Glucose-Capillary: 112 mg/dL — ABNORMAL HIGH (ref 70–99)
Glucose-Capillary: 115 mg/dL — ABNORMAL HIGH (ref 70–99)
Glucose-Capillary: 115 mg/dL — ABNORMAL HIGH (ref 70–99)

## 2023-03-21 LAB — CBC
HCT: 26.7 % — ABNORMAL LOW (ref 36.0–46.0)
Hemoglobin: 8.3 g/dL — ABNORMAL LOW (ref 12.0–15.0)
MCH: 29.4 pg (ref 26.0–34.0)
MCHC: 31.1 g/dL (ref 30.0–36.0)
MCV: 94.7 fL (ref 80.0–100.0)
Platelets: 503 10*3/uL — ABNORMAL HIGH (ref 150–400)
RBC: 2.82 MIL/uL — ABNORMAL LOW (ref 3.87–5.11)
RDW: 15.1 % (ref 11.5–15.5)
WBC: 9.9 10*3/uL (ref 4.0–10.5)
nRBC: 0 % (ref 0.0–0.2)

## 2023-03-21 LAB — BASIC METABOLIC PANEL
Anion gap: 9 (ref 5–15)
BUN: 16 mg/dL (ref 8–23)
CO2: 27 mmol/L (ref 22–32)
Calcium: 8.3 mg/dL — ABNORMAL LOW (ref 8.9–10.3)
Chloride: 97 mmol/L — ABNORMAL LOW (ref 98–111)
Creatinine, Ser: 0.89 mg/dL (ref 0.44–1.00)
GFR, Estimated: >60 mL/min (ref >60)
Glucose, Bld: 117 mg/dL — ABNORMAL HIGH (ref 70–99)
Potassium: 3.9 mmol/L (ref 3.5–5.1)
Sodium: 133 mmol/L — ABNORMAL LOW (ref 135–145)

## 2023-03-21 LAB — PROTIME-INR
INR: 2.6 — ABNORMAL HIGH (ref 0.8–1.2)
Prothrombin Time: 27.8 s — ABNORMAL HIGH (ref 11.4–15.2)

## 2023-03-21 LAB — MAGNESIUM: Magnesium: 2.2 mg/dL (ref 1.7–2.4)

## 2023-03-21 LAB — COOXEMETRY PANEL
Carboxyhemoglobin: 2.1 % — ABNORMAL HIGH (ref 0.5–1.5)
Methemoglobin: <0.7 % (ref 0.0–1.5)
O2 Saturation: 67.9 %
Total hemoglobin: 8.3 g/dL — ABNORMAL LOW (ref 12.0–16.0)

## 2023-03-21 MED ORDER — WARFARIN SODIUM 2.5 MG PO TABS: 2.5000 mg | ORAL_TABLET | Freq: Once | ORAL | Status: DC

## 2023-03-21 MED ORDER — WARFARIN SODIUM 2 MG PO TABS
2.0000 mg | ORAL_TABLET | Freq: Once | ORAL | Status: AC
  Administered 2023-03-21: 2 mg via ORAL
  Filled 2023-03-21: qty 1

## 2023-03-21 MED ORDER — FUROSEMIDE 40 MG PO TABS
40.0000 mg | ORAL_TABLET | Freq: Every day | ORAL | Status: DC
  Administered 2023-03-21 – 2023-03-22 (×2): 40 mg via ORAL
  Filled 2023-03-21 (×2): qty 1

## 2023-03-21 MED ORDER — LOSARTAN POTASSIUM 25 MG PO TABS
25.0000 mg | ORAL_TABLET | Freq: Every day | ORAL | Status: DC
  Administered 2023-03-21 – 2023-03-22 (×2): 25 mg via ORAL
  Filled 2023-03-21 (×2): qty 1

## 2023-03-21 MED ORDER — POTASSIUM CHLORIDE CRYS ER 20 MEQ PO TBCR
20.0000 meq | EXTENDED_RELEASE_TABLET | Freq: Once | ORAL | Status: AC
  Administered 2023-03-21: 20 meq via ORAL
  Filled 2023-03-21: qty 1

## 2023-03-21 MED ORDER — AMIODARONE HCL 200 MG PO TABS
200.0000 mg | ORAL_TABLET | Freq: Two times a day (BID) | ORAL | Status: DC
Start: 2023-03-21 — End: 2023-03-22
  Administered 2023-03-21 – 2023-03-22 (×3): 200 mg via ORAL
  Filled 2023-03-21 (×3): qty 1

## 2023-03-21 NOTE — Progress Notes (Signed)
CARDIAC REHAB PHASE I   Went to offer walk to pt, pt sleeping in bed very comfortably. Denied walk at this time. Will return as time allows.   Jonna Coup, MS, ACSM-CEP 03/21/2023 1:12 PM

## 2023-03-21 NOTE — Progress Notes (Addendum)
Mobility Specialist Progress Note:    03/21/23 0921  Mobility  Activity Ambulated with assistance to bathroom  Level of Assistance Minimal assist, patient does 75% or more  Assistive Device None  Distance Ambulated (ft) 5 ft  Activity Response Tolerated well  Mobility Referral Yes  Mobility visit 1 Mobility  Mobility Specialist Start Time (ACUTE ONLY) 0915  Mobility Specialist Stop Time (ACUTE ONLY) [id number]  Mobility Specialist Time Calculation (min) (ACUTE ONLY) 6 min   Pt received in bathroom, responded to bathroom call light. Assisted pt off commode with MinA, no AD required. Maintained sternal precautions throughout. Ambulated to chair in front of sink for sponge bath with assistance of NT. Pt was fatigued from transfer, SpO2 93% on RA, HR 62 bpm. Left with all needs met.   Penny Mccullough Mobility Specialist Please contact via Special educational needs teacher or  Rehab office at [phone]

## 2023-03-21 NOTE — Progress Notes (Signed)
Physical Therapy Treatment Patient Details Name: Penny Mccullough MRN: [id number] DOB: 08-30-1945 Today's Date: 03/21/2023   History of Present Illness 78 y.o. female admitted 03/06/23 for planned MVR, tricuspid valve repair, redo sternotomy. PMH includes CAD s/p CABG (1989), afib on Eliquis, CHF, COPD, CVA, HTN, MI.    PT Comments  Pt reported fatigue from bathing earlier this morning but still did well with mobility and has less pain in chest when mobilizing than when lying in bed. Encouraged pt to use RW for pain control and this did help with increasing distance. Pt ambulated 150' with RW and CGA. VSS on RA. Continue to recommend HHPT at d/c.     If plan is discharge home, recommend the following: A little help with walking and/or transfers;Assistance with cooking/housework;Assist for transportation;Help with stairs or ramp for entrance   Can travel by private vehicle        Equipment Recommendations  Rollator (4 wheels)    Recommendations for Other Services       Precautions / Restrictions Precautions Precautions: Fall;Sternal Precaution Booklet Issued: No Precaution Comments: pt asking good questions about what she can and can't do with sternal precautions Restrictions Weight Bearing Restrictions Per Provider Order: Yes RUE Weight Bearing Per Provider Order: Non weight bearing LUE Weight Bearing Per Provider Order: Non weight bearing Other Position/Activity Restrictions: sternal precautions     Mobility  Bed Mobility Overal bed mobility: Needs Assistance Bed Mobility: Supine to Sit, Sit to Supine     Supine to sit: Contact guard Sit to supine: Min assist   General bed mobility comments: min A to LE's for sit to supine due to chest pain as she descends    Transfers Overall transfer level: Needs assistance Equipment used: None Transfers: Sit to/from Stand Sit to Stand: Contact guard assist           General transfer comment: able to stand holding heart pillow,  also practiced with hands on knees in case she doesn't have a pillow nearby at home    Ambulation/Gait Ambulation/Gait assistance: Contact guard assist Gait Distance (Feet): 150 Feet Assistive device: Rolling walker (2 wheels) Gait Pattern/deviations: Step-through pattern, Decreased stride length Gait velocity: decreased Gait velocity interpretation: <1.8 ft/sec, indicate of risk for recurrent falls   General Gait Details: encouraged pt to use RW to help manage chest pain and she did feel better with it once RW was lowered. Recommend she use for the first few weeks at home, esp for longer distances   Stairs             Wheelchair Mobility     Tilt Bed    Modified Rankin (Stroke Patients Only)       Balance Overall balance assessment: Mild deficits observed, not formally tested                                          Cognition Arousal: Alert Behavior During Therapy: WFL for tasks assessed/performed Overall Cognitive Status: Within Functional Limits for tasks assessed                                          Exercises Other Exercises Other Exercises: cervical rotation R and L x5 Other Exercises: shoulder shrugs x10    General Comments General comments (skin integrity, edema,  etc.): HR 61-67 bpm, SPO2 94-97% on RA      Pertinent Vitals/Pain Pain Assessment Pain Assessment: Faces Faces Pain Scale: Hurts little more Pain Location: soreness at sternum/incision site Pain Descriptors / Indicators: Sore Pain Intervention(s): Limited activity within patient's tolerance, Monitored during session    Home Living                          Prior Function            PT Goals (current goals can now be found in the care plan section) Acute Rehab PT Goals Patient Stated Goal: home PT Goal Formulation: With patient Time For Goal Achievement: 03/25/23 Potential to Achieve Goals: Good Progress towards PT goals:  Progressing toward goals    Frequency    Min 3X/week      PT Plan      Co-evaluation              AM-PAC PT "6 Clicks" Mobility   Outcome Measure  Help needed turning from your back to your side while in a flat bed without using bedrails?: None Help needed moving from lying on your back to sitting on the side of a flat bed without using bedrails?: A Little Help needed moving to and from a bed to a chair (including a wheelchair)?: A Little Help needed standing up from a chair using your arms (e.g., wheelchair or bedside chair)?: A Little Help needed to walk in hospital room?: A Little Help needed climbing 3-5 steps with a railing? : A Little 6 Click Score: 19    End of Session Equipment Utilized During Treatment: Gait belt Activity Tolerance: Patient tolerated treatment well Patient left: with call bell/phone within reach;in bed Nurse Communication: Mobility status PT Visit Diagnosis: Unsteadiness on feet (R26.81);Muscle weakness (generalized) (M62.81)     Time: 4098-1191 PT Time Calculation (min) (ACUTE ONLY): 26 min  Charges:    $Gait Training: 23-37 mins PT General Charges $$ ACUTE PT VISIT: 1 Visit                     Lyanne Co, PT  Acute Rehab Services Secure chat preferred Office [phone]    Lawana Chambers Sherrian Nunnelley 03/21/2023, 1:13 PM

## 2023-03-21 NOTE — Progress Notes (Signed)
2 Days Post-Op Procedure(s) (LRB): CARDIOVERSION (N/A) Subjective: Improving, some abdominal discomfort but much improved, getting stronger  Objective: Vital signs in last 24 hours: Temp:  [97.5 F (36.4 C)-98.7 F (37.1 C)] 97.7 F (36.5 C) (01/30 0307) Pulse Rate:  [52-56] 52 (01/30 0307) Cardiac Rhythm: Sinus bradycardia (01/30 0000) Resp:  [10-19] 10 (01/30 0307) BP: (111-147)/(59-75) 112/60 (01/30 0307) SpO2:  [92 %-97 %] 93 % (01/30 0307)  Hemodynamic parameters for last 24 hours: CVP:  [10 mmHg-12 mmHg] 12 mmHg  Intake/Output from previous day: 01/29 0701 - 01/30 0700 In: 418.9 [I.V.:418.9] Out: -  Intake/Output this shift: No intake/output data recorded.  General appearance: alert, cooperative, and no distress Heart: regular rate and rhythm Lungs: coarse exp BS Abdomen: benign Extremities: no edema Wound: incis healing well  Lab Results: Recent Labs    03/19/23 0326 03/20/23 0434  WBC 10.8* 9.3  HGB 8.3* 7.8*  HCT 26.4* 24.2*  PLT 444* 431*   BMET:  Recent Labs    03/20/23 1442 03/21/23 0400  NA 133* 133*  K 3.9 3.9  CL 98 97*  CO2 26 27  GLUCOSE 123* 117*  BUN 16 16  CREATININE 1.00 0.89  CALCIUM 8.2* 8.3*    PT/INR:  Recent Labs    03/21/23 0400  LABPROT 27.8*  INR 2.6*   ABG    Component Value Date/Time   PHART 7.373 03/07/2023 0506   HCO3 22.2 03/07/2023 0506   TCO2 23 03/07/2023 0506   ACIDBASEDEF 3.0 (H) 03/07/2023 0506   O2SAT 67.9 03/21/2023 0400   CBG (last 3)  Recent Labs    03/20/23 1622 03/20/23 2118 03/21/23 0639  GLUCAP 91 106* 102*    Meds Scheduled Meds:  arformoterol  15 mcg Nebulization BID   aspirin  81 mg Oral Daily   atorvastatin  80 mg Oral Daily   bisacodyl  10 mg Oral Daily   Or   bisacodyl  10 mg Rectal Daily   budesonide (PULMICORT) nebulizer solution  0.5 mg Nebulization BID   Chlorhexidine Gluconate Cloth  6 each Topical Daily   docusate sodium  200 mg Oral Daily   ezetimibe  10 mg Oral  Daily   insulin aspart  0-5 Units Subcutaneous QHS   insulin aspart  0-6 Units Subcutaneous TID WC   lactulose  20 g Oral BID   polyethylene glycol  17 g Oral BID   potassium chloride  20 mEq Oral Once   revefenacin  175 mcg Nebulization Daily   senna  2 tablet Oral BID   sodium chloride flush  10-40 mL Intracatheter Q12H   spironolactone  25 mg Oral Daily   warfarin  2 mg Oral ONCE-1600   Warfarin - Pharmacist Dosing Inpatient   Does not apply q1600   Continuous Infusions:  amiodarone 30 mg/hr (03/20/23 2341)   PRN Meds:.albuterol, ondansetron, mouth rinse, oxyCODONE, sodium chloride flush, traMADol  Xrays DG Chest 2 View Result Date: 03/20/2023 CLINICAL DATA:  Status post mitral valve repair. EXAM: CHEST - 2 VIEW COMPARISON:  March 13, 2023. FINDINGS: Stable cardiomegaly with central pulmonary vascular congestion. Bilateral pulmonary edema is noted with small pleural effusions. Right-sided PICC line is unchanged. Sternotomy wires are noted. Bony thorax is unremarkable. IMPRESSION: Increased central pulmonary vascular congestion and bilateral pulmonary edema is noted with small pleural effusions. Electronically Signed   By: Lupita Raider M.D.   On: 03/20/2023 09:55   EP STUDY Result Date: 03/19/2023 See surgical note for result.  Assessment/Plan: S/P Procedure(s) (LRB): CARDIOVERSION (N/A) POD#15  1 afeb, s BP 110's- 140's, no plans to restart milrinone per AHF, sinus brady- not seen by EP yesterday- will switch to po amio- AHF initiating GDMT as tolerated 2 O2 sats good on RA, conts nebs and pulm hygiene 3 not weighed yet 4 voiding , unmeasured 6 BS ok , likely restart jardiance at d/c but values good for now 7 normal renal fxn, Mg++ and K+ ok, not on lasix currently, sodium 133 8 cont rehab, getting closer to Discharge  LOS: 15 days    Rowe Clack PA-C Pager [phone] 03/21/2023

## 2023-03-21 NOTE — Progress Notes (Addendum)
Advanced Heart Failure Rounding Note  HF Cardiologist: Dr. Shirlee Latch  Chief Complaint: SOB   Subjective:   1/23: On Milrinone for Co-ox 47. 1/28: successful DCCV to sinus brady. Milrinone stopped   Co-ox 68. Off Milrinone. CVP 8-9 Maintaining SB in 50-60s.  Cr improved. Hgb pending.   No SOB at rest. Dyspneic with exertion. Appetite is good. Ambulating in room and halls.    Objective:    Weight Range: 59.9 kg Body mass index is 24.95 kg/m.   Vital Signs:   Temp:  [97.6 F (36.4 C)-98.7 F (37.1 C)] 98.1 F (36.7 C) (01/30 0743) Pulse Rate:  [52-56] 56 (01/30 0743) Resp:  [10-19] 14 (01/30 0743) BP: (111-147)/(59-75) 119/63 (01/30 0743) SpO2:  [92 %-100 %] 100 % (01/30 0743) Last BM Date : 03/20/23  Weight change: Filed Weights   03/17/23 0500 03/18/23 0500 03/19/23 0238  Weight: 60 kg 60.8 kg 59.9 kg   Intake/Output:   Intake/Output Summary (Last 24 hours) at 03/21/2023 0859 Last data filed at 03/21/2023 0400 Gross per 24 hour  Intake 418.94 ml  Output --  Net 418.94 ml    Physical Exam   CVP 8-9 General: Elderly appearing. No distress on RA.  Cardiac: JVP ~10cm. S1 and S2 present. No murmurs or rub. Resp: Coarse, inspiratory rhonchi, expiratory wheeze. Extremities: Warm and dry. No rash, cyanosis.  No peripheral edema.  Neuro: Alert and oriented x3. Hard of hearing. Affect pleasant. Moves all extremities without difficulty. Lines/Devices:  RUE PICC  Telemetry   SB in 50s (personally reviewed)  Labs    CBC Recent Labs    03/19/23 0326 03/20/23 0434  WBC 10.8* 9.3  HGB 8.3* 7.8*  HCT 26.4* 24.2*  MCV 95.7 93.4  PLT 444* 431*   Basic Metabolic Panel Recent Labs    86/57/84 0434 03/20/23 1442 03/21/23 0400  NA 133* 133* 133*  K 4.0 3.9 3.9  CL 96* 98 97*  CO2 28 26 27   GLUCOSE 105* 123* 117*  BUN 14 16 16   CREATININE 1.09* 1.00 0.89  CALCIUM 8.2* 8.2* 8.3*  MG 2.3  --  2.2   Liver Function Tests No results for input(s): "AST",  "ALT", "ALKPHOS", "BILITOT", "PROT", "ALBUMIN" in the last 72 hours. No results for input(s): "LIPASE", "AMYLASE" in the last 72 hours. Cardiac Enzymes No results for input(s): "CKTOTAL", "CKMB", "CKMBINDEX", "TROPONINI" in the last 72 hours.  BNP: BNP (last 3 results) Recent Labs    06/18/22 1458 07/19/22 1227 02/05/23 1130  BNP 363.7* 256.9* 519.0*   ProBNP (last 3 results) Recent Labs    08/15/22 1209  PROBNP 435   D-Dimer No results for input(s): "DDIMER" in the last 72 hours. Hemoglobin A1C No results for input(s): "HGBA1C" in the last 72 hours. Fasting Lipid Panel No results for input(s): "CHOL", "HDL", "LDLCALC", "TRIG", "CHOLHDL", "LDLDIRECT" in the last 72 hours. Thyroid Function Tests No results for input(s): "TSH", "T4TOTAL", "T3FREE", "THYROIDAB" in the last 72 hours.  Invalid input(s): "FREET3"  Other results:  Imaging   No results found.   Medications:    Scheduled Medications:  amiodarone  200 mg Oral BID   arformoterol  15 mcg Nebulization BID   aspirin  81 mg Oral Daily   atorvastatin  80 mg Oral Daily   bisacodyl  10 mg Oral Daily   Or   bisacodyl  10 mg Rectal Daily   budesonide (PULMICORT) nebulizer solution  0.5 mg Nebulization BID   Chlorhexidine Gluconate Cloth  6  each Topical Daily   docusate sodium  200 mg Oral Daily   ezetimibe  10 mg Oral Daily   insulin aspart  0-5 Units Subcutaneous QHS   insulin aspart  0-6 Units Subcutaneous TID WC   lactulose  20 g Oral BID   polyethylene glycol  17 g Oral BID   potassium chloride  20 mEq Oral Once   revefenacin  175 mcg Nebulization Daily   senna  2 tablet Oral BID   sodium chloride flush  10-40 mL Intracatheter Q12H   spironolactone  25 mg Oral Daily   warfarin  2 mg Oral ONCE-1600   Warfarin - Pharmacist Dosing Inpatient   Does not apply q1600    Infusions:    PRN Medications: albuterol, ondansetron, mouth rinse, oxyCODONE, sodium chloride flush, traMADol  Patient Profile    Emmilyn Crooke is a 78 yo woman with PAF, COPD, CAD s/p previous CABG (LIMA) > LIMA occluded. Patent LAD stents cath 2/24. 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    Underwent MV Replacement/TV Repair and LAA ligation on 03/06/23. Post-op course c/b afib w RVR and biventricular failure R>L (drop in LVEF 40-45%>>25-30%, RV severely reduced).  Assessment/Plan   1. Severe MR/TR - 2/2 rheumatic heart disease  - S/P MV Replacement (bio) TV Repair, LAA - trivial MR on post-op echo - continue to ambulate. Encourage IS   2. PAF - Developed A fib in 2022. Had cardioversion 2023.  - Back in A fib w/ RVR post-op - successful DCCV 1/28 > SB (LAA ligation with recent surgery) - continue amiodarone 200 mg bid (transitioned from IV yesterday) - On warfarin. INR now 2.6. Discussed dosing with PharmD   3. Acute Biventricular HFrEF w/ Low Output  - Intra-op TEE EF 40-45% - Post-op Echo EF down to 25-30% with RV severely reduced - AF RVR likely contributed - Initial low co-ox 49%, improved with diuresis and milrinone - Milrinone stopped 1/28. Co-ox 68 today. - CVP 8-9. Start Lasix 40 mg daily - avoiding dig w/ bradycardia, fragility and AKI  - Start Losartan 25 mg today. On Entresto previously, would like to get back on but BP too soft at current. - Continue Spiro 25 mg daily   4. Acute Hypoxic Respiratory Failure. Resolved.  - COPD at baseline. She does require oxygen at home. - Debs per primary - resolved with diuresis   5. Anemia, Expected Blood Loss - Hgb 7.8>pending today - No obvious source of bleeding - transfusion threshold per CT surgery   6. Hypokalemia/Hypomagnesemia  - resolved  7. AKI  - resolved - in setting of low Co-ox/ marginal CI   CIR evaluation pending.  Length of Stay: 58  [place] Lee, NP  03/21/2023, 8:59 AM  Advanced Heart Failure Team Pager [phone] (M-F; 7a - 5p)  Please contact [facility] for night-coverage after hours (5p -7a ) and weekends on  [email or web address]   Patient seen and examined with the above-signed Advanced Practice Provider and/or Housestaff. I personally reviewed laboratory data, imaging studies and relevant notes. I independently examined the patient and formulated the important aspects of the plan. I have edited the note to reflect any of my changes or salient points. I have personally discussed the plan with the patient and/or family.  SHe is sitting in chair talking to her daughter.   Feels good. Maintaining NSR. Co-ox and CVP look good. INR 2.6 BP improved  General:  Elderly HOH No resp difficulty HEENT: normal Neck: supple. JVP 8-9 . Carotids 2+  bilat; no bruits. No lymphadenopathy or thryomegaly appreciated. Cor: Sternal wound ok Regular rate & rhythm. No rubs, gallops or murmurs. Lungs: decreased Abdomen: soft, nontender, nondistended. No hepatosplenomegaly. No bruits or masses. Good bowel sounds. Extremities: no cyanosis, clubbing, rash, edema Neuro: alert & orientedx3, cranial nerves grossly intact. moves all 4 extremities w/o difficulty. Affect pleasant  She is in NSR. Co-ox stable. Agree with adding back GDMT. Hopefully home in am.   Arvilla Meres, MD  7:18 PM

## 2023-03-21 NOTE — Progress Notes (Signed)
Occupational Therapy Treatment Patient Details Name: Penny Mccullough MRN: [id number] DOB: Dec 26, 1945 Today's Date: 03/21/2023   History of present illness 78 y.o. female admitted 03/06/23 for planned MVR, tricuspid valve repair, redo sternotomy. PMH includes CAD s/p CABG (1989), afib on Eliquis, CHF, COPD, CVA, HTN, MI.   OT comments  OT session focused on training in techniques for increased safety and independence with functional tasks and functional transfers/mobility during functional tasks, including review of sternal precautions. Pt demonstrates good carryover of prior training in precautions with pt able to Independently state precautions without cues and requiring occasional cues to adhere to precautions during functional tasks. Pt now largely performing ADLs with Mod I to Contact guard assist and functional transfers/mobility hugging heart pillow and without an AD with close Supervision to Contact guard assist. Pt participated well in session and is making good progress toward goals with pt making adequate progress for discharge from OT standpoint at this time. OT to continue to follow acutely. Post acute discharge, pt will benefit from continued skilled OT services in the home to maximize rehab potential.       If plan is discharge home, recommend the following:  A little help with walking and/or transfers;A little help with bathing/dressing/bathroom;Assistance with cooking/housework;Assist for transportation;Help with stairs or ramp for entrance   Equipment Recommendations  None recommended by OT    Recommendations for Other Services      Precautions / Restrictions Precautions Precautions: Fall;Sternal Precaution Booklet Issued: No Precaution Comments: verbally reviewed precautions with pt demonstrating good carryover from education in prior sessions Restrictions Weight Bearing Restrictions Per Provider Order: Yes RUE Weight Bearing Per Provider Order: Non weight bearing LUE Weight  Bearing Per Provider Order: Non weight bearing Other Position/Activity Restrictions: sternal precautions       Mobility Bed Mobility Overal bed mobility: Needs Assistance Bed Mobility: Supine to Sit     Supine to sit: Contact guard          Transfers Overall transfer level: Needs assistance Equipment used: None Transfers: Sit to/from Stand, Bed to chair/wheelchair/BSC Sit to Stand: Contact guard assist (while hugging heart pillow)     Step pivot transfers: Supervision, Contact guard assist     General transfer comment: largely close Supervision for step-pivot transfers with CGA to boost up from bed and standard height toilet while hugging heart pillow     Balance Overall balance assessment: Mild deficits observed, not formally tested                                         ADL either performed or assessed with clinical judgement   ADL Overall ADL's : Needs assistance/impaired Eating/Feeding: Modified independent;Sitting Eating/Feeding Details (indicate cue type and reason): occasional assist with opening containers Grooming: Supervision/safety;Standing (adhering to sternal precautions) Grooming Details (indicate cue type and reason): close Supervision for safety         Upper Body Dressing : Set up;Sitting   Lower Body Dressing: Supervision/safety;Sitting/lateral leans;Sit to/from stand Lower Body Dressing Details (indicate cue type and reason): close Supervision for safety Toilet Transfer: Supervision/safety;Contact guard assist;Ambulation;Regular Toilet (hugging heart pillow in sit/stand) Toilet Transfer Details (indicate cue type and reason): +1 hand held assist; largely close Supervision with CGA to boost up from standard height toilet while hugging heart pillow Toileting- Clothing Manipulation and Hygiene: Modified independent;Sitting/lateral lean (adhering to sternal precautions)       Functional mobility during  ADLs: Supervision/safety  (without an AD; close Supervision for safety) General ADL Comments: Pt able to Independently state sternal precautions without cues and requiring occasional cues throughout session to adhere to sternal precautions during functional tasks. Pt continues to present with decreased activity tolerance but with noted imporvements in activity tolerance this session with pt tolerating >15 minute OOB activities.    Extremity/Trunk Assessment Upper Extremity Assessment Upper Extremity Assessment: Right hand dominant;Generalized weakness (tested within sternal precautions)   Lower Extremity Assessment Lower Extremity Assessment: Defer to PT evaluation        Vision       Perception     Praxis      Cognition Arousal: Alert Behavior During Therapy: [facility] for tasks assessed/performed Overall Cognitive Status: Within Functional Limits for tasks assessed                                 General Comments: AAOx4 and pleasant throughout session. Pt demosntrating good carryover of prior training in ahering to sternal precautions throughout session.        Exercises      Shoulder Instructions       General Comments HR 58 to 65 bpm and O2 sat >/94% on RA throughout session. NT present during a portion of session    Pertinent Vitals/ Pain       Pain Assessment Pain Assessment: Faces Faces Pain Scale: Hurts little more Pain Location: soreness at sternum/incision site Pain Descriptors / Indicators: Sore, Grimacing, Guarding Pain Intervention(s): Limited activity within patient's tolerance, Monitored during session, Repositioned  Home Living                                          Prior Functioning/Environment              Frequency  Min 1X/week        Progress Toward Goals  OT Goals(current goals can now be found in the care plan section)  Progress towards OT goals: Progressing toward goals  Acute Rehab OT Goals Patient Stated Goal: to continue  to feel better and return home  Plan      Co-evaluation                 AM-PAC OT "6 Clicks" Daily Activity     Outcome Measure   Help from another person eating meals?: None Help from another person taking care of personal grooming?: A Little Help from another person toileting, which includes using toliet, bedpan, or urinal?: A Little Help from another person bathing (including washing, rinsing, drying)?: A Little Help from another person to put on and taking off regular upper body clothing?: A Little Help from another person to put on and taking off regular lower body clothing?: A Little 6 Click Score: 19    End of Session Equipment Utilized During Treatment: Gait belt  OT Visit Diagnosis: Unsteadiness on feet (R26.81);Muscle weakness (generalized) (M62.81);Other (comment) (decreased activity tolerance)   Activity Tolerance Patient tolerated treatment well   Patient Left in chair;with call bell/phone within reach   Nurse Communication Mobility status        Time: 0981-1914 OT Time Calculation (min): 33 min  Charges: OT General Charges $OT Visit: 1 Visit OT Treatments $Self Care/Home Management : 23-37 mins  Georgeann Brinkman "Orson Eva., OTR/L, MA Acute Rehab [phone]  Lendon Colonel 03/21/2023, 6:42 PM

## 2023-03-21 NOTE — Plan of Care (Signed)
  Problem: Clinical Measurements: Goal: Diagnostic test results will improve Outcome: Progressing Goal: Respiratory complications will improve Outcome: Progressing Goal: Cardiovascular complication will be avoided Outcome: Progressing   Problem: Clinical Measurements: Goal: Diagnostic test results will improve Outcome: Progressing   Problem: Clinical Measurements: Goal: Respiratory complications will improve Outcome: Progressing   Problem: Clinical Measurements: Goal: Cardiovascular complication will be avoided Outcome: Progressing

## 2023-03-21 NOTE — Progress Notes (Addendum)
PHARMACY - ANTICOAGULATION CONSULT NOTE  Pharmacy Consult for warfarin Indication: afib + bioprosthetic mitral valve  Allergies  Allergen Reactions   Codeine Nausea And Vomiting    Patient Measurements: Height: 5\' 1"  (154.9 cm) Weight: 59.9 kg (132 lb 0.9 oz) IBW/kg (Calculated) : 47.8  Vital Signs: Temp: 97.7 F (36.5 C) (01/30 0307) Temp Source: Oral (01/30 0307) BP: 112/60 (01/30 0307) Pulse Rate: 52 (01/30 0307)  Labs: Recent Labs    03/19/23 0326 03/20/23 0434 03/20/23 1442 03/21/23 0400  HGB 8.3* 7.8*  --   --   HCT 26.4* 24.2*  --   --   PLT 444* 431*  --   --   LABPROT 25.1* 25.6*  --  27.8*  INR 2.3* 2.3*  --  2.6*  CREATININE 0.74 1.09* 1.00 0.89    Estimated Creatinine Clearance: 44.7 mL/min (by C-G formula based on SCr of 0.89 mg/dL).   Medical History: Past Medical History:  Diagnosis Date   CAD (coronary artery disease)    CHF (congestive heart failure) (HCC)    COPD (chronic obstructive pulmonary disease) (HCC)    CVA (cerebral vascular accident) (HCC)    Dysrhythmia    A.Fib   Heart murmur    History of kidney stones    HLD (hyperlipidemia)    Hypertension    MI (myocardial infarction) (HCC) 10/2020    Assessment: 78 yo female previously on apixaban for atrial fibrillation, pharmacy asked to switch to Coumadin for bioprosthetic mitral valve + afib starting 03/09/22.  INR is therapeutic and unchanged at 2.6 (+0.3). CBC is stable with no signs of bleeding. Patient's appetite is back to baseline as she has eaten the majority of her most recent meals with no feeding supplement shakes. Amiodarone was switched from PO to IV on 1/23, will monitor therapy with known DDI with warfarin. Patient underwent DCCV 1/28.  Goal of Therapy:  INR 2-3 Monitor platelets by anticoagulation protocol: Yes   Plan:  Coumadin 2 mg po x 1 tonight Monitor daily INR, CBC, and signs of bleeding Monitor diet, amiodarone administration, and other potential  DDI.  Wilmer Floor, PharmD PGY2 Cardiology Pharmacy Resident  03/21/2023 6:28 AM   [facility] pharmacy phone numbers are listed on [email or web address]

## 2023-03-21 NOTE — TOC Progression Note (Addendum)
Transition of Care [facility]) - Progression Note    Patient Details  Name: Penny Mccullough MRN: [id number] Date of Birth: 01/28/1946  Transition of Care [facility]) CM/SW Contact  Elliot Cousin, RN Phone Number: [phone] 03/21/2023, 3:56 PM  Clinical Narrative:     HF TOC CM spoke to pt and offered choice for [facility] ([email or web address] list placed on chart). Agreeable to [facility] for [facility]. Pt requesting Rollator for home. She has her mother's RW at home. Contacted Emily Filbert for [employer]. Declined BSC. Dtr will provide transportation home.   Expected Discharge Plan: Home w Home Health Services Barriers to Discharge: No Barriers Identified  Expected Discharge Plan and Services   Discharge Planning Services: CM Consult Post Acute Care Choice: Home Health Living arrangements for the past 2 months: Single Family Home                 DME Arranged: Walker rolling with seat DME Agency: [facility] Date DME Agency Contacted: 03/21/23 Time DME Agency Contacted: 1555 Representative spoke with at DME Agency: Zollie Beckers HH Arranged: PT, OT [facility] Agency: [facility] Date HH Agency Contacted: 03/21/23 Time HH Agency Contacted: 1555 Representative spoke with at [facility] Agency: Clarise Cruz   Social Determinants of Health (SDOH) Interventions SDOH Screenings   Food Insecurity: No Food Insecurity (03/07/2023)  Housing: Low Risk  (03/07/2023)  Transportation Needs: No Transportation Needs (03/07/2023)  Utilities: Not At Risk (03/07/2023)  Depression (PHQ2-9): Low Risk  (02/16/2021)  Social Connections: Unknown (03/07/2023)  Tobacco Use: Medium Risk (03/06/2023)    Readmission Risk Interventions     No data to display

## 2023-03-22 DIAGNOSIS — Z952 Presence of prosthetic heart valve: Secondary | ICD-10-CM | POA: Diagnosis not present

## 2023-03-22 LAB — COOXEMETRY PANEL
Carboxyhemoglobin: 1.9 % — ABNORMAL HIGH (ref 0.5–1.5)
Methemoglobin: <0.7 % (ref 0.0–1.5)
O2 Saturation: 65.6 %
Total hemoglobin: 8.4 g/dL — ABNORMAL LOW (ref 12.0–16.0)

## 2023-03-22 LAB — BASIC METABOLIC PANEL
Anion gap: 8 (ref 5–15)
BUN: 17 mg/dL (ref 8–23)
CO2: 26 mmol/L (ref 22–32)
Calcium: 8.4 mg/dL — ABNORMAL LOW (ref 8.9–10.3)
Chloride: 102 mmol/L (ref 98–111)
Creatinine, Ser: 0.96 mg/dL (ref 0.44–1.00)
GFR, Estimated: >60 mL/min (ref >60)
Glucose, Bld: 98 mg/dL (ref 70–99)
Potassium: 4.1 mmol/L (ref 3.5–5.1)
Sodium: 136 mmol/L (ref 135–145)

## 2023-03-22 LAB — CBC
HCT: 24.9 % — ABNORMAL LOW (ref 36.0–46.0)
Hemoglobin: 8 g/dL — ABNORMAL LOW (ref 12.0–15.0)
MCH: 30 pg (ref 26.0–34.0)
MCHC: 32.1 g/dL (ref 30.0–36.0)
MCV: 93.3 fL (ref 80.0–100.0)
Platelets: 501 10*3/uL — ABNORMAL HIGH (ref 150–400)
RBC: 2.67 MIL/uL — ABNORMAL LOW (ref 3.87–5.11)
RDW: 15.3 % (ref 11.5–15.5)
WBC: 8.4 10*3/uL (ref 4.0–10.5)
nRBC: 0 % (ref 0.0–0.2)

## 2023-03-22 LAB — PROTIME-INR
INR: 2.5 — ABNORMAL HIGH (ref 0.8–1.2)
Prothrombin Time: 26.9 s — ABNORMAL HIGH (ref 11.4–15.2)

## 2023-03-22 LAB — GLUCOSE, CAPILLARY: Glucose-Capillary: 83 mg/dL (ref 70–99)

## 2023-03-22 LAB — MAGNESIUM: Magnesium: 2.3 mg/dL (ref 1.7–2.4)

## 2023-03-22 MED ORDER — WARFARIN SODIUM 2 MG PO TABS: 4.0000 mg | ORAL_TABLET | Freq: Every day | ORAL | 2 refills | Status: DC

## 2023-03-22 MED ORDER — LOSARTAN POTASSIUM 25 MG PO TABS: 25.0000 mg | ORAL_TABLET | Freq: Every day | ORAL | 1 refills | Status: DC

## 2023-03-22 MED ORDER — ASPIRIN 81 MG PO CHEW: 81.0000 mg | CHEWABLE_TABLET | Freq: Every day | ORAL | Status: DC

## 2023-03-22 MED ORDER — SPIRONOLACTONE 25 MG PO TABS: 25.0000 mg | ORAL_TABLET | Freq: Every day | ORAL | 1 refills | Status: DC

## 2023-03-22 MED ORDER — AMIODARONE HCL 200 MG PO TABS: 200.0000 mg | ORAL_TABLET | Freq: Two times a day (BID) | ORAL | 1 refills | Status: DC

## 2023-03-22 MED ORDER — FUROSEMIDE 40 MG PO TABS: 40.0000 mg | ORAL_TABLET | Freq: Every day | ORAL | 1 refills | Status: DC

## 2023-03-22 MED ORDER — WARFARIN SODIUM 2 MG PO TABS: 4.0000 mg | ORAL_TABLET | Freq: Once | ORAL | Status: DC

## 2023-03-22 MED ORDER — OXYCODONE HCL 5 MG PO TABS: 5.0000 mg | ORAL_TABLET | Freq: Four times a day (QID) | ORAL | 0 refills | Status: DC | PRN

## 2023-03-22 MED ORDER — POTASSIUM CHLORIDE CRYS ER 20 MEQ PO TBCR: 20.0000 meq | EXTENDED_RELEASE_TABLET | Freq: Every day | ORAL | 1 refills | Status: DC

## 2023-03-22 NOTE — Progress Notes (Signed)
Mobility Specialist Progress Note:    03/22/23 1030  Mobility  Activity Ambulated with assistance to bathroom  Level of Assistance Contact guard assist, steadying assist  Assistive Device None  Distance Ambulated (ft) 10 ft  Activity Response Tolerated well  Mobility Referral Yes  Mobility visit 1 Mobility  Mobility Specialist Start Time (ACUTE ONLY) 1030  Mobility Specialist Stop Time (ACUTE ONLY) 1035  Mobility Specialist Time Calculation (min) (ACUTE ONLY) 5 min   Pt received in bathroom, requesting assistance to transfer back to bed. Deferred further mobility d/t PICC line removal. Tolerated well, followed sternal precautions throughout. Left pt in bed with all needs met.   Feliciana Rossetti Mobility Specialist Please contact via Special educational needs teacher or  Rehab office at [phone]

## 2023-03-22 NOTE — Progress Notes (Addendum)
Discharge instructions reviewed with pt. Pt had no questions at this time.  Copy of instructions given to pt. Pt informed her scripts were sent to her pharmacy for pick up. Pt's daughter has called pt back and she will be coming to pick her up. Pt getting dressed at this time, will have pt reach back out to daughter for location and time arrival after she is dressed, pt may go to the lounge while she waits for daughter.   Pt will be d/c'd via wheelchair with belongings, and will be escorted by staff.   Yecenia Dalgleish,RN SWOT   1222--daughter here in the room, pt ready to go, pt to be wheeled out

## 2023-03-22 NOTE — Progress Notes (Signed)
PICC Removal Note: PICC line removed from RUE. PICC catheter tip visualized and intact. Pressure held until hemostasis achieved. Pressure dressing applied. No redness, ecchymosis, edema, swelling, or drainage noted at site. Instructions provided on post PICC discharge care, including followup notification instructions.  Bedrest until 11:15.

## 2023-03-22 NOTE — Progress Notes (Addendum)
Advanced Heart Failure Rounding Note  HF Cardiologist: Dr. Shirlee Latch  Chief Complaint: Heart Failure.   Subjective:   1/23: On Milrinone for Co-ox 47. 1/28: successful DCCV to sinus brady. Milrinone stopped   Denies SOB. Wants to go home  Objective:    Weight Range: 60.2 kg Body mass index is 25.08 kg/m.   Vital Signs:   Temp:  [97.4 F (36.3 C)-98 F (36.7 C)] 97.7 F (36.5 C) (01/31 0508) Pulse Rate:  [54-61] 54 (01/31 0508) Resp:  [15-20] 15 (01/31 0508) BP: (92-116)/(48-69) 109/61 (01/31 0508) SpO2:  [93 %-99 %] 95 % (01/31 0508) Weight:  [60.2 kg] 60.2 kg (01/31 0508) Last BM Date : 03/21/23  Weight change: Filed Weights   03/18/23 0500 03/19/23 0238 03/22/23 0508  Weight: 60.8 kg 59.9 kg 60.2 kg   Intake/Output:   Intake/Output Summary (Last 24 hours) at 03/22/2023 0837 Last data filed at 03/22/2023 0013 Gross per 24 hour  Intake --  Output 1250 ml  Net -1250 ml    Physical Exam  General:   No resp difficulty Neck: supple. JVP 7-8 . Carotids 2+ bilat; no bruits.  Cor: PMI nondisplaced. Regular rate & rhythm. No rubs, gallops or murmurs. Sternal incision approximated.  Lungs: clear Abdomen: soft, nontender, nondistended.  Good bowel sounds. Extremities: no cyanosis, clubbing, rash, edema. RUE PICC  Neuro: alert & orientedx3, cranial nerves grossly intact. moves all 4 extremities w/o difficulty. Affect pleasant  Telemetry   SB-SR 50-60s   Labs    CBC Recent Labs    03/21/23 0909 03/22/23 0500  WBC 9.9 8.4  HGB 8.3* 8.0*  HCT 26.7* 24.9*  MCV 94.7 93.3  PLT 503* 501*   Basic Metabolic Panel Recent Labs    21/30/86 0400 03/22/23 0500  NA 133* 136  K 3.9 4.1  CL 97* 102  CO2 27 26  GLUCOSE 117* 98  BUN 16 17  CREATININE 0.89 0.96  CALCIUM 8.3* 8.4*  MG 2.2 2.3   Liver Function Tests No results for input(s): "AST", "ALT", "ALKPHOS", "BILITOT", "PROT", "ALBUMIN" in the last 72 hours. No results for input(s): "LIPASE", "AMYLASE" in  the last 72 hours. Cardiac Enzymes No results for input(s): "CKTOTAL", "CKMB", "CKMBINDEX", "TROPONINI" in the last 72 hours.  BNP: BNP (last 3 results) Recent Labs    06/18/22 1458 07/19/22 1227 02/05/23 1130  BNP 363.7* 256.9* 519.0*   ProBNP (last 3 results) Recent Labs    08/15/22 1209  PROBNP 435   D-Dimer No results for input(s): "DDIMER" in the last 72 hours. Hemoglobin A1C No results for input(s): "HGBA1C" in the last 72 hours. Fasting Lipid Panel No results for input(s): "CHOL", "HDL", "LDLCALC", "TRIG", "CHOLHDL", "LDLDIRECT" in the last 72 hours. Thyroid Function Tests No results for input(s): "TSH", "T4TOTAL", "T3FREE", "THYROIDAB" in the last 72 hours.  Invalid input(s): "FREET3"  Other results:  Imaging   No results found.   Medications:    Scheduled Medications:  amiodarone  200 mg Oral BID   arformoterol  15 mcg Nebulization BID   aspirin  81 mg Oral Daily   atorvastatin  80 mg Oral Daily   bisacodyl  10 mg Oral Daily   Or   bisacodyl  10 mg Rectal Daily   budesonide (PULMICORT) nebulizer solution  0.5 mg Nebulization BID   Chlorhexidine Gluconate Cloth  6 each Topical Daily   docusate sodium  200 mg Oral Daily   ezetimibe  10 mg Oral Daily   furosemide  40  mg Oral Daily   insulin aspart  0-5 Units Subcutaneous QHS   insulin aspart  0-6 Units Subcutaneous TID WC   lactulose  20 g Oral BID   losartan  25 mg Oral Daily   polyethylene glycol  17 g Oral BID   revefenacin  175 mcg Nebulization Daily   senna  2 tablet Oral BID   sodium chloride flush  10-40 mL Intracatheter Q12H   spironolactone  25 mg Oral Daily   warfarin  4 mg Oral ONCE-1600   Warfarin - Pharmacist Dosing Inpatient   Does not apply q1600    Infusions:    PRN Medications: albuterol, ondansetron, mouth rinse, oxyCODONE, sodium chloride flush, traMADol  Patient Profile   Penny Mccullough is a 78 yo woman with PAF, COPD, CAD s/p previous CABG (LIMA) > LIMA occluded.  Patent LAD stents cath 2/24. 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    Underwent MV Replacement/TV Repair and LAA ligation on 03/06/23. Post-op course c/b afib w RVR and biventricular failure R>L (drop in LVEF 40-45%>>25-30%, RV severely reduced).  Assessment/Plan   1. Severe MR/TR - 2/2 rheumatic heart disease  - S/P MV Replacement (bio) TV Repair, LAA - trivial MR on post-op echo - Pain controlled. Tolerating diet.    2. PAF - Developed A fib in 2022. Had cardioversion 2023.  - Back in A fib w/ RVR post-op - successful DCCV 1/28 > SB (LAA ligation with recent surgery) - Maintaining SR.  - continue amiodarone 200 mg bid x 7 days then 200 mg dialy.  - On warfarin. INR 2.5 . Discussed dosing with PharmD   3. Acute Biventricular HFrEF w/ Low Output  - Intra-op TEE EF 40-45% - Post-op Echo EF down to 25-30% with RV severely reduced - AF RVR likely contributed - Initial low co-ox 49%, improved with diuresis and milrinone - Milrinone stopped 1/28. CO-OX stable.  - Appears euvolemic. Continue  Lasix 40 mg daily - Start jardiance. 10 mg daily.  - avoiding dig w/ bradycardia, fragility and AKI  - Continue Losartan 25 mg today. Consider switching to entresto as an outpatient.  - Continue Spiro 25 mg daily - Renal function stable.    4. Acute Hypoxic Respiratory Failure. Resolved.  - COPD at baseline. She does require oxygen at home. - Debs per primary - Sats stable on room air.    5. Anemia, Expected Blood Loss - Hgb 8 today. No obvious source of bleeding.   6. Hypokalemia/Hypomagnesemia  - resolved  7. AKI  - resolved   Plan home with [facility].  - Aspirin 81 mg daily  - Atorvastatin 80 mg daily  -Lasix 40 mg daily -Jardiance. 10 mg daily.  -  Losartan 25 mg today.  Cleda Daub 25 mg daily -amiodarone 200 mg bid x 7 days then 200 mg dialy.  - Coumadin dose per pharmacy.   Post hospital 03/27/23.   Length of Stay: 16  Tonye Becket, NP  03/22/2023, 8:37 AM  Advanced Heart Failure Team Pager  [phone] (M-F; 7a - 5p)  Please contact [facility] for night-coverage after hours (5p -7a ) and weekends on [email or web address]  Patient seen and examined with the above-signed Advanced Practice Provider and/or Housestaff. I personally reviewed laboratory data, imaging studies and relevant notes. I independently examined the patient and formulated the important aspects of the plan. I have edited the note to reflect any of my changes or salient points. I have personally discussed the plan with the patient and/or family.  Doing well. Denies SOB. Remains in NSR. Eager to go home.   General:  Sitting up No resp difficulty HEENT: normal Neck: supple. no JVD. Carotids 2+ bilat; no bruits. No lymphadenopathy or thryomegaly appreciated. Cor: Sternal wound ok Regular rate & rhythm. No rubs, gallops or murmurs. Lungs: mildly decreased  Abdomen: soft, nontender, nondistended. No hepatosplenomegaly. No bruits or masses. Good bowel sounds. Extremities: no cyanosis, clubbing, rash, edema Neuro: alert & orientedx3, cranial nerves grossly intact. moves all 4 extremities w/o difficulty. Affect pleasant  Ok for d/c today from our standpoint. Agree with meds as above. Reviewed with HF PharmD personally.   Will arrange HF f/u.   Arvilla Meres, MD  9:12 AM

## 2023-03-22 NOTE — TOC Transition Note (Signed)
Transition of Care [facility]) - Discharge Note   Patient Details  Name: Penny Mccullough MRN: [id number] Date of Birth: [birth year]  Transition of Care [facility]) CM/SW Contact:  Elliot Cousin, RN Phone Number: [phone] 03/22/2023, 10:02 AM   Clinical Narrative:     TOC CM spoke daughter and she will provide transportation home. [facility] will deliver Rollator to room. Contacted [facility] rep, Aggie Cosier for [facility]. She has orders. Will arrange PCP hospital follow up appt.   Final next level of care: Home w Home Health Services Barriers to Discharge: No Barriers Identified   Patient Goals and CMS Choice Patient states their goals for this hospitalization and ongoing recovery are:: wants to recover CMS Medicare.gov Compare Post Acute Care list provided to:: Patient Choice offered to / list presented to : Patient      Discharge Placement                       Discharge Plan and Services Additional resources added to the After Visit Summary for     Discharge Planning Services: CM Consult Post Acute Care Choice: Home Health          DME Arranged: Walker rolling with seat DME Agency: [facility] Date DME Agency Contacted: 03/21/23 Time DME Agency Contacted: 1555 Representative spoke with at DME Agency: Zollie Beckers HH Arranged: PT, OT [facility] Agency: [facility] Date HH Agency Contacted: 03/21/23 Time HH Agency Contacted: 1555 Representative spoke with at [facility] Agency: Clarise Cruz  Social Drivers of Health (SDOH) Interventions SDOH Screenings   Food Insecurity: No Food Insecurity (03/07/2023)  Housing: Low Risk  (03/07/2023)  Transportation Needs: No Transportation Needs (03/07/2023)  Utilities: Not At Risk (03/07/2023)  Depression (PHQ2-9): Low Risk  (02/16/2021)  Social Connections: Unknown (03/07/2023)  Tobacco Use: Medium Risk (03/06/2023)     Readmission Risk Interventions    03/22/2023    9:52 AM  Readmission Risk Prevention Plan  Transportation  Screening Complete  Home Care Screening Complete  Medication Review (RN CM) Complete

## 2023-03-22 NOTE — Care Management Important Message (Signed)
Important Message  Patient Details  Name: Penny Mccullough MRN: [id number] Date of Birth: 11-25-1945   Important Message Given:  Yes - Medicare IM     Renie Ora 03/22/2023, 10:02 AM

## 2023-03-22 NOTE — Progress Notes (Signed)
Discussed with pt IS (got new one as it was lost, 800 ml), sternal precautions, exercise, diet including low sodium, daily wts, and CRPII. Pt receptive although HOH. Gave OHS booklet and set up d/c video. Pt moved out of bed following sternal precautions and walked to BR touching furniture intermittently. To recliner. Moving well. Will refer to [facility] CRPII.  [id number] Ethelda Chick BS, ACSM-CEP 03/22/2023 9:56 AM

## 2023-03-22 NOTE — Progress Notes (Signed)
3 Days Post-Op Procedure(s) (LRB): CARDIOVERSION (N/A) Subjective: Feels ok ,somewhat fatigued  Objective: Vital signs in last 24 hours: Temp:  [97.4 F (36.3 C)-98.1 F (36.7 C)] 97.7 F (36.5 C) (01/31 0508) Pulse Rate:  [54-61] 54 (01/31 0508) Cardiac Rhythm: Sinus bradycardia;Heart block (01/30 1900) Resp:  [14-20] 15 (01/31 0508) BP: (92-119)/(48-69) 109/61 (01/31 0508) SpO2:  [93 %-100 %] 95 % (01/31 0508) Weight:  [60.2 kg] 60.2 kg (01/31 0508)  Hemodynamic parameters for last 24 hours: CVP:  [6 mmHg-10 mmHg] 7 mmHg  Intake/Output from previous day: 01/30 0701 - 01/31 0700 In: -  Out: 1250 [Urine:1250] Intake/Output this shift: No intake/output data recorded.  General appearance: alert, cooperative, and no distress Heart: RRR, no murmur Lungs: mildly dim in bases Abdomen: benign Extremities: no edema Wound: incis healing well  Lab Results: Recent Labs    03/21/23 0909 03/22/23 0500  WBC 9.9 8.4  HGB 8.3* 8.0*  HCT 26.7* 24.9*  PLT 503* 501*   BMET:  Recent Labs    03/21/23 0400 03/22/23 0500  NA 133* 136  K 3.9 4.1  CL 97* 102  CO2 27 26  GLUCOSE 117* 98  BUN 16 17  CREATININE 0.89 0.96  CALCIUM 8.3* 8.4*    PT/INR:  Recent Labs    03/22/23 0500  LABPROT 26.9*  INR 2.5*   ABG    Component Value Date/Time   PHART 7.373 03/07/2023 0506   HCO3 22.2 03/07/2023 0506   TCO2 23 03/07/2023 0506   ACIDBASEDEF 3.0 (H) 03/07/2023 0506   O2SAT 65.6 03/22/2023 0500   CBG (last 3)  Recent Labs    03/21/23 1607 03/21/23 2124 03/22/23 0614  GLUCAP 115* 115* 83    Meds Scheduled Meds:  amiodarone  200 mg Oral BID   arformoterol  15 mcg Nebulization BID   aspirin  81 mg Oral Daily   atorvastatin  80 mg Oral Daily   bisacodyl  10 mg Oral Daily   Or   bisacodyl  10 mg Rectal Daily   budesonide (PULMICORT) nebulizer solution  0.5 mg Nebulization BID   Chlorhexidine Gluconate Cloth  6 each Topical Daily   docusate sodium  200 mg Oral  Daily   ezetimibe  10 mg Oral Daily   furosemide  40 mg Oral Daily   insulin aspart  0-5 Units Subcutaneous QHS   insulin aspart  0-6 Units Subcutaneous TID WC   lactulose  20 g Oral BID   losartan  25 mg Oral Daily   polyethylene glycol  17 g Oral BID   revefenacin  175 mcg Nebulization Daily   senna  2 tablet Oral BID   sodium chloride flush  10-40 mL Intracatheter Q12H   spironolactone  25 mg Oral Daily   warfarin  4 mg Oral ONCE-1600   Warfarin - Pharmacist Dosing Inpatient   Does not apply q1600   Continuous Infusions: PRN Meds:.albuterol, ondansetron, mouth rinse, oxyCODONE, sodium chloride flush, traMADol  Xrays No results found.  Assessment/Plan: S/P Procedure(s) (LRB): CARDIOVERSION (N/A) POD#16   1 afeb, s BP 90's-110's, sinus brady in 50's, Co-ox 65 2 sats good on RA 3 Weight stable 4 renal fxn stable 5 Hgb stable 6 INR 2.5 - coumadin clinic arranged for early next week 7 can go home if stable from AHF perspective    LOS: 16 days    Rowe Clack PA-C Pager [phone] 03/22/2023

## 2023-03-22 NOTE — Progress Notes (Signed)
PHARMACY - ANTICOAGULATION CONSULT NOTE  Pharmacy Consult for warfarin Indication: afib + bioprosthetic mitral valve  Allergies  Allergen Reactions   Codeine Nausea And Vomiting    Patient Measurements: Height: 5\' 1"  (154.9 cm) Weight: 60.2 kg (132 lb 11.5 oz) IBW/kg (Calculated) : 47.8  Vital Signs: Temp: 97.7 F (36.5 C) (01/31 0508) Temp Source: Oral (01/31 0508) BP: 109/61 (01/31 0508) Pulse Rate: 54 (01/31 0508)  Labs: Recent Labs    03/20/23 0434 03/20/23 1442 03/21/23 0400 03/21/23 0909 03/22/23 0500  HGB 7.8*  --   --  8.3* 8.0*  HCT 24.2*  --   --  26.7* 24.9*  PLT 431*  --   --  503* 501*  LABPROT 25.6*  --  27.8*  --  26.9*  INR 2.3*  --  2.6*  --  2.5*  CREATININE 1.09* 1.00 0.89  --  0.96    Estimated Creatinine Clearance: 41.6 mL/min (by C-G formula based on SCr of 0.96 mg/dL).   Medical History: Past Medical History:  Diagnosis Date   CAD (coronary artery disease)    CHF (congestive heart failure) (HCC)    COPD (chronic obstructive pulmonary disease) (HCC)    CVA (cerebral vascular accident) (HCC)    Dysrhythmia    A.Fib   Heart murmur    History of kidney stones    HLD (hyperlipidemia)    Hypertension    MI (myocardial infarction) (HCC) 10/2020    Assessment: 78 yo female previously on apixaban for atrial fibrillation, pharmacy asked to switch to Coumadin for bioprosthetic mitral valve + afib starting 03/09/22.  INR is therapeutic at 2.5 (-0.1). CBC is stable with no signs of bleeding. Patient's appetite is back to baseline as she has eaten the majority of her most recent meals with no feeding supplement shakes. Amiodarone was switched from PO to IV on 1/23, will monitor therapy with known DDI with warfarin. Patient underwent DCCV 1/28.  Goal of Therapy:  INR 2-3 Monitor platelets by anticoagulation protocol: Yes   Plan:  Coumadin 4 mg po x 1 tonight Monitor daily INR, CBC, and signs of bleeding Monitor diet, amiodarone  administration, and other potential DDI.  Wilmer Floor, PharmD PGY2 Cardiology Pharmacy Resident  03/22/2023 6:29 AM   [facility] pharmacy phone numbers are listed on [email or web address]

## 2023-03-24 DIAGNOSIS — Z48812 Encounter for surgical aftercare following surgery on the circulatory system: Secondary | ICD-10-CM | POA: Diagnosis not present

## 2023-03-26 ENCOUNTER — Other Ambulatory Visit: Payer: Self-pay

## 2023-03-26 DIAGNOSIS — Z952 Presence of prosthetic heart valve: Secondary | ICD-10-CM

## 2023-03-26 DIAGNOSIS — I4891 Unspecified atrial fibrillation: Secondary | ICD-10-CM

## 2023-03-27 ENCOUNTER — Encounter: Payer: Self-pay | Admitting: Cardiology

## 2023-03-27 ENCOUNTER — Ambulatory Visit: Payer: 59 | Attending: Cardiology | Admitting: Cardiology

## 2023-03-27 ENCOUNTER — Ambulatory Visit (INDEPENDENT_AMBULATORY_CARE_PROVIDER_SITE_OTHER): Payer: 59

## 2023-03-27 VITALS — BP 140/80 | HR 66 | Ht 61.0 in | Wt 125.4 lb

## 2023-03-27 DIAGNOSIS — Z952 Presence of prosthetic heart valve: Secondary | ICD-10-CM | POA: Diagnosis not present

## 2023-03-27 DIAGNOSIS — I480 Paroxysmal atrial fibrillation: Secondary | ICD-10-CM

## 2023-03-27 DIAGNOSIS — I5022 Chronic systolic (congestive) heart failure: Secondary | ICD-10-CM

## 2023-03-27 DIAGNOSIS — I340 Nonrheumatic mitral (valve) insufficiency: Secondary | ICD-10-CM | POA: Diagnosis not present

## 2023-03-27 DIAGNOSIS — I5020 Unspecified systolic (congestive) heart failure: Secondary | ICD-10-CM

## 2023-03-27 DIAGNOSIS — Z7901 Long term (current) use of anticoagulants: Secondary | ICD-10-CM | POA: Insufficient documentation

## 2023-03-27 DIAGNOSIS — E782 Mixed hyperlipidemia: Secondary | ICD-10-CM

## 2023-03-27 DIAGNOSIS — J449 Chronic obstructive pulmonary disease, unspecified: Secondary | ICD-10-CM

## 2023-03-27 DIAGNOSIS — I25118 Atherosclerotic heart disease of native coronary artery with other forms of angina pectoris: Secondary | ICD-10-CM

## 2023-03-27 DIAGNOSIS — D649 Anemia, unspecified: Secondary | ICD-10-CM

## 2023-03-27 DIAGNOSIS — I4891 Unspecified atrial fibrillation: Secondary | ICD-10-CM | POA: Insufficient documentation

## 2023-03-27 DIAGNOSIS — I255 Ischemic cardiomyopathy: Secondary | ICD-10-CM

## 2023-03-27 LAB — POCT INR: INR: 5.8 — AB (ref 2.0–3.0)

## 2023-03-27 MED ORDER — BOUDREAUXS BUTT PASTE 1 % EX OINT: 1.0000 | TOPICAL_OINTMENT | Freq: Three times a day (TID) | CUTANEOUS | 0 refills | Status: AC | PRN

## 2023-03-27 MED ORDER — OXYCODONE HCL 5 MG PO TABS: 5.0000 mg | ORAL_TABLET | Freq: Four times a day (QID) | ORAL | 0 refills | Status: AC | PRN

## 2023-03-27 NOTE — Patient Instructions (Addendum)
 Medication Instructions:  We have sent in a prescription for Boudreauxs butt paste to be used three times daily as needed  A printed prescription for the Oxycodone  has been given to you. Please take [facility] to [facility] [address].  *If you need a refill on your cardiac medications before your next appointment, please call your pharmacy*   Lab Work: Your provider would like for you to have the following labs today: CBC, BMET, Direct LDL  If you have labs (blood work) drawn today and your tests are completely normal, you will receive your results only by: MyChart Message (if you have MyChart) OR A paper copy in the mail If you have any lab test that is abnormal or we need to change your treatment, we will call you to review the results.   Testing/Procedures: None ordered   Follow-Up: At [facility], you and your health needs are our priority.  As part of our continuing mission to provide you with exceptional heart care, we have created designated Provider Care Teams.  These Care Teams include your primary Cardiologist (physician) and Advanced Practice Providers (APPs -  Physician Assistants and Nurse Practitioners) who all work together to provide you with the care you need, when you need it.  We recommend signing up for the patient portal called MyChart.  Sign up information is provided on this After Visit Summary.  MyChart is used to connect with patients for Virtual Visits (Telemedicine).  Patients are able to view lab/test results, encounter notes, upcoming appointments, etc.  Non-urgent messages can be sent to your provider as well.   To learn more about what you can do with MyChart, go to [email or web address].    Your next appointment:   Keep follow up as scheduled

## 2023-03-27 NOTE — Progress Notes (Signed)
 Cardiology Office Note:  .   Date:  03/27/2023  ID:  Penny Mccullough, DOB [birth year], MRN [id number] PCP: Patient, No Pcp Per  [facility] Providers Cardiologist:  None    History of Present Illness: .   Penny Mccullough is a 78 y.o. 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 More recently she has been seen for the evaluation of exertional dyspnea at which time echocardiogram showed an EF 45-50%, mild LVH, normal RV, severe MR. She was referred for TEE 04/2022 that confirmed severe MR with restricted posterior leaflet. R/LHC showed prominent v-waves in PCWP tracing and nonobstrutive CAD. There has been concern that the MR is rheumatic/inflammatory which is not not generally treated with mTEER. She was then referred to Penny Mccullough for surgical evaluation and was felt to not be inoperable however was considered higher risk due to prior sternotomy. She saw Penny Mccullough 6/26 and was rather reluctant to undergo surgery as she was doing well with no significant symptoms and no pronounced murmur on exam. Plan was for repeat echo and exercise stress testing. Echo 7/25 was felt to be inconclusive to determine MR and TEE was ordered to observe mitral valve leaflets more closely.  She was considered not a candidate for and TEE on was felt best to undergo surgical MVR.  She was admitted to [facility] on 03/06/2023 and taken to the operating room at which time she underwent redo sternotomy with mitral valve replacement and tricuspid valve ring annuloplasty.  She tolerated the procedure well and was taken to the surgical intensive care unit in stable condition.  She was weaned  off of the ventilator without difficulty but did initially require BiPAP for recurrent hypoxemia.  PCCM team was consulted to assist with ICU management.  She initially required epinephrine  and milrinone  for inotropic support that was eventually able to be weaned off based on hemodynamic parameters.  She remained in chronic atrial fibrillation postoperatively.  She was to be transitioned back to oral anticoagulant with Coumadin . She initially had to be started on IV amiodarone  due to significant ventricular ectopy and was then transition to oral dosing.  It was also noted she had postoperative volume overload as expected following cardiac surgery but not considered to be clinically significant and was diuresed accordingly.  She was also noted to have preoperative UA showing a UTI and was treated postoperatively with antibiotics.  Chest tubes were removed on 1/17 without complication.  Coumadin  was started on 1/18.  On postop day 3 of inotropic support was able to be weaned off.  Coags are stable at 58.  Diuresis was continued for interstitial pulmonary edema.  By postop day 4 previous complaints of nausea had improved and her abdominal exam was benign.  She recurrent atrial fibrillation postoperatively with RVR.  Coumadin  was started with pharmacy team managing dose while she was inpatient.  She was also treated with both oral and intravenous amiodarone .  Oxygen requirement was significant postoperatively of 12 L HFNC.  She was diuresed for postoperative pulmonary edema likely caused by ministration of fluids leading to acute exacerbation of chronic HFrEF.  Repeat echocardiogram in 1/21 showed an EF of 25-30%, no pericardial effusion was seen and valves were  functioning appropriately.  Advanced heart failure team was consulted for assistance with management.  Her Co. ox was had improved with diuresis and was eventually able to be discontinued.  INR reached goal at 2.3 and she underwent DCCV 01/28.  She was then  transferred to the progressive care unit.  She continued to progress and was maintaining sinus bradycardia in the 50s.  She was maximized on her current GDMT as tolerated.  Oxygen was weaned and she had continued to maintain good saturations.  Incisions were healing well without evidence of infection.  She was tolerating her diet and having regular bowel movements.  She was gradually increasing activities.  She was considered stable for discharge and was able to be discharged home on 03/22/2023.   She returns to clinic today accompanied by her daughter. She is in continuous pain rate 7 out of 10 on the pain scale. Per the patient and her daughter [facility] was [facility] from [facility] stated they were out of the medication and since it was controlled substance they were unable to transfer into an additional facility.  Home health nurse was in the house and stated that she will take care of it on Sunday and we have called into Wednesday and the patient has been taking Tylenol  and is visibly in pain with elevated blood pressures today.  She complains of postsurgical discomfort.  She denies any worsening shortness of breath, peripheral edema, she has not been able to rest as she has been uncomfortable but has tried to eat.  Several of her medications were changed during hospitalization.  With recent discharge she also has a reddened area to her lower back on the top of her sacrum she is requesting medication for this well.  She has tried to be compliant with her medications but has a hard time taking them.  She also has follow-up with Coumadin  clinic today.  ROS: 10 point review of systems has been reviewed and considered negative except what is listed in HPI  Studies Reviewed: SABRA   EKG Interpretation Date/Time:  Wednesday March 27 2023 10:44:26 EST Ventricular Rate:  66 PR Interval:  196 QRS Duration:  70 QT Interval:  470 QTC Calculation: 492 R  Axis:   0  Text Interpretation: Normal sinus rhythm Nonspecific T wave abnormality When compared with ECG of 19-Mar-2023 09:58, Confirmed by Gerard Frederick ([id number]) on 03/27/2023 10:49:06 AM    2D echo 03/12/2023  1. Left ventricular ejection fraction, by estimation, is 25 to 30%. The  left ventricle has severely decreased function. The left ventricle  demonstrates global hypokinesis. There is mild concentric left ventricular  hypertrophy. Left ventricular diastolic   function could not be evaluated.   2. Right ventricular systolic function is severely reduced. The right  ventricular size is moderately enlarged. There is moderately elevated  pulmonary artery systolic pressure. The estimated right ventricular  systolic pressure is 48.6 mmHg.   3. Left atrial size was severely dilated.   4. Right atrial size was mildly dilated.   5. The mitral valve has been repaired/replaced. Trivial mitral valve  regurgitation. No evidence of mitral stenosis. The mean mitral valve  gradient is 3.7 mmHg with average heart rate of 120 bpm. There is a 29 mm  [employer] Mosaic Bioprosthesis present in   the mitral position. Procedure Date: 03/06/23. Echo findings are  consistent with normal structure and function of the mitral valve  prosthesis.   6. 30mm  MC3 Tricuspid ring implanted 03/06/23      . The tricuspid valve is has been repaired/replaced. The tricuspid  valve is status post repair with an annuloplasty ring.   7. The aortic valve is tricuspid. Aortic valve regurgitation is trivial.  Aortic valve sclerosis is present, with no evidence of aortic valve  stenosis.   8. The inferior vena cava is dilated in size with <50% respiratory  variability, suggesting right atrial pressure of 15 mmHg.   RHC 01/22/2023 Conclusions: Mildly elevated left and right heart filling pressures (mean RA 7 mmHg, PCWP 23 mmHg). Moderate pulmonary hypertension (PA 68/23, mean 38 mmHg, PVR 3.8 WU). Low normal Fick cardiac  output/index (CO 3.9 L/min, CI 2.4 L/min/m^2).   Recommendations: Increase furosemide  to 40 mg PO daily. If no evidence of bleeding/vascular injury at catheterization site, apixaban  can be resumed tonight. Ongoing management of mitral regurgitation and pulmonary hypertension per Drs. Gollan and Weldner. Risk Assessment/Calculations:    CHA2DS2-VASc Score = 5   This indicates a 7.2% annual risk of stroke. The patient's score is based upon: CHF History: 1 HTN History: 0 Diabetes History: 0 Stroke History: 0 Vascular Disease History: 1 Age Score: 2 Gender Score: 1    HYPERTENSION CONTROL Vitals:   03/27/23 1039 03/27/23 1045  BP: (!) 152/64 (!) 140/80    The patient's blood pressure is elevated above target today.  In order to address the patient's elevated BP: Blood pressure will be monitored at home to determine if medication changes need to be made. (currently in pain)          Physical Exam:   VS:  BP (!) 140/80 (BP Location: Left Arm, Patient Position: Sitting, Cuff Size: Normal)   Pulse 66   Ht 5' 1 (1.549 m)   Wt 125 lb 6.4 oz (56.9 kg)   SpO2 98%   BMI 23.69 kg/m    Wt Readings from Last 3 Encounters:  03/27/23 125 lb 6.4 oz (56.9 kg)  03/22/23 132 lb 11.5 oz (60.2 kg)  03/04/23 132 lb 14.4 oz (60.3 kg)    GEN: Well nourished, well developed in no mild distress from uncontrolled postoperative pain NECK: No JVD; No carotid bruits CARDIAC: RRR, no murmurs, rubs, gallops; midsternal incision healing well with no open areas areas of scab to the with no drainage RESPIRATORY:  Clear with mildly diminished bases due to decreased inspiratory effort to auscultation without rales, wheezing or rhonchi  ABDOMEN: Soft, non-tender, non-distended EXTREMITIES:  No edema; No deformity   ASSESSMENT AND PLAN: .   Severe mitral regurgitation/tricuspid regurgitation secondary to rheumatic heart disease status post mitral valve replacement (BR), TV repair, LAA.  Trivial MR on  postop echocardiogram.  Follow-up echocardiogram has already been scheduled.  She will continue to keep her follow-up appointments with structural team.  She was discharged home with prescription for pain medication which was unable to be filled she has been given a prescription for oxycodone  5 mg every 6 hours for severe pain for the next 5 days and has been advised to follow-up with her surgical team for any continued needs.  She has also been advised that once cleared by surgery she will need to participate with cardiac rehab.   Paroxysmal atrial fibrillation developed A-fib in 2022 with cardioversion 2023.  She was noted to be back in atrial fibrillation with RVR postoperatively underwent successful DCCV 03/19/2023.  She was maintaining sinus rhythm on discharge in the 50s.  She was loaded with IV and oral amiodarone   and was weaned down to amiodarone  200 mg daily.  She is also continued on warfarin for CHA2DS2-VASc score of at least 5 For stroke prophylaxis.  She has to follow-up with Coumadin  clinic today.  HFrEF with recent acute biventricular and low output heart failure during recent hospitalization postoperatively requiring inotropic support.  She is euvolemic on exam.  She is continued on Jardiance  10 mg daily, furosemide  40 mg daily, losartan  25 mg daily, Aldactone  25 mg daily.  She has been sent for follow-up labs today of CBC and BMP to reevaluate kidney function.  She has been encouraged to weigh daily limit her fluid and sodium intake.  Depending on kidney function we will consider changing her off of losartan  back to Entresto  now that blood pressure has improved.  Coronary artery disease status post CABG with subsequent remote stent placement.  She is continued on aspirin  81 mg daily as well as atorvastatin  80 mg daily and ezetimibe  10 mg daily.  EKG today reveals sinus rhythm with a rate of 66 with nonspecific T wave abnormalities with no acute change from prior studies.  She complains of  incisional chest pain today which she has been given a written prescription for pain medication but no anginal symptoms.  Anemia with a hemoglobin 8.0 03/22/2023.  She has been sent for repeat blood work today.  She denies noting any blood in her stool or urine.  Hypertension blood pressure today 152/64 on recheck was 140/80.  Blood pressure is stable but slightly elevated likely impacted by pain.  Depending on current labs we will consider changing losartan  25 mg daily and back to Entresto  24/26 mg twice daily.  She is also continued on furosemide  40 mg daily and Aldactone  25 mg daily.  She has been encouraged to monitor blood pressures 1 to 2 hours postmedication administration.  Mixed hyperlipidemia with LDL of 82 goal of 70 or less ideally 55.  Will get updated lipid panel today.  COPD without acute exacerbation has remained stable.   Cardiac Rehabilitation Eligibility Assessment         Dispo: Patient return to clinic to see MD/APP at previously scheduled appointment for March or sooner if needed for reevaluation of symptoms.  Signed, Sterling Ucci, NP

## 2023-03-27 NOTE — Patient Instructions (Signed)
 HOLD Tonight, Thursday, Friday and Saturday then Decrease to 1 tablet Daily, except 2 tablets on Monday, Wednesday and Friday.  Amiodarone  reduced to 200 mg Daily 1/8.  INR in 1 week.  [phone]  A full discussion of the nature of anticoagulants has been carried out.  A benefit risk analysis has been presented to the patient, so that they understand the justification for choosing anticoagulation at this time. The need for frequent and regular monitoring, precise dosage adjustment and compliance is stressed.  Side effects of potential bleeding are discussed.  The patient should avoid any OTC items containing aspirin  or ibuprofen, and should avoid great swings in general diet.  Avoid alcohol consumption.  Call if any signs of abnormal bleeding.

## 2023-03-28 LAB — BASIC METABOLIC PANEL
BUN/Creatinine Ratio: 19 (ref 12–28)
BUN: 21 mg/dL (ref 8–27)
CO2: 20 mmol/L (ref 20–29)
Calcium: 9.4 mg/dL (ref 8.7–10.3)
Chloride: 107 mmol/L — ABNORMAL HIGH (ref 96–106)
Creatinine, Ser: 1.08 mg/dL — ABNORMAL HIGH (ref 0.57–1.00)
Glucose: 104 mg/dL — ABNORMAL HIGH (ref 70–99)
Potassium: 5.2 mmol/L (ref 3.5–5.2)
Sodium: 144 mmol/L (ref 134–144)
eGFR: 53 mL/min/{1.73_m2} — ABNORMAL LOW (ref >59)

## 2023-03-28 LAB — CBC
Hematocrit: 36.6 % (ref 34.0–46.6)
Hemoglobin: 11.3 g/dL (ref 11.1–15.9)
MCH: 29 pg (ref 26.6–33.0)
MCHC: 30.9 g/dL — ABNORMAL LOW (ref 31.5–35.7)
MCV: 94 fL (ref 79–97)
Platelets: 644 10*3/uL — ABNORMAL HIGH (ref 150–450)
RBC: 3.9 x10E6/uL (ref 3.77–5.28)
RDW: 13.9 % (ref 11.7–15.4)
WBC: 6.2 10*3/uL (ref 3.4–10.8)

## 2023-03-28 LAB — LDL CHOLESTEROL, DIRECT: LDL Direct: 44 mg/dL (ref 0–99)

## 2023-03-28 NOTE — Progress Notes (Signed)
 Blood counts have improved.  Kidney function slightly elevated potentially from possible dehydration.  Cholesterol at goal.  Increase oral intake.  Continue current medication regimen without changes at this time.

## 2023-03-29 ENCOUNTER — Encounter: Payer: Self-pay | Admitting: *Deleted

## 2023-03-29 ENCOUNTER — Telehealth: Payer: Self-pay

## 2023-03-29 NOTE — Telephone Encounter (Signed)
 Judie Noun, OT with [facility] contacted the office to state patient has refused HHOT and declined the evaluation.

## 2023-04-01 ENCOUNTER — Ambulatory Visit (INDEPENDENT_AMBULATORY_CARE_PROVIDER_SITE_OTHER): Payer: Self-pay | Admitting: Physician Assistant

## 2023-04-01 ENCOUNTER — Encounter: Payer: Self-pay | Admitting: Physician Assistant

## 2023-04-01 ENCOUNTER — Ambulatory Visit
Admission: RE | Admit: 2023-04-01 | Discharge: 2023-04-01 | Disposition: A | Discharge status: Home / Self Care | Payer: 59 | Source: Ambulatory Visit | Attending: Thoracic Surgery (Cardiothoracic Vascular Surgery)

## 2023-04-01 VITALS — BP 110/67 | HR 69 | Resp 18 | Ht 61.0 in | Wt 124.0 lb

## 2023-04-01 DIAGNOSIS — I340 Nonrheumatic mitral (valve) insufficiency: Secondary | ICD-10-CM

## 2023-04-01 DIAGNOSIS — Z9889 Other specified postprocedural states: Secondary | ICD-10-CM

## 2023-04-01 DIAGNOSIS — Z952 Presence of prosthetic heart valve: Secondary | ICD-10-CM

## 2023-04-01 NOTE — Patient Instructions (Signed)
Continue to avoid any heavy lifting or strenuous use of your arms or shoulders for at least a total of three months from the time of surgery.  After three months you may gradually increase how much you lift or otherwise use your arms or chest as tolerated, with limits based upon whether or not activities lead to the return of significant discomfort.  Endocarditis is a potentially serious infection of heart valves or inside lining of the heart.  It occurs more commonly in patients with diseased heart valves (such as patient's with aortic or mitral valve disease) and in patients who have undergone heart valve repair or replacement.  Certain surgical and dental procedures may put you at risk, such as dental cleaning, other dental procedures, or any surgery involving the respiratory, urinary, gastrointestinal tract, gallbladder or prostate gland.   To minimize your chances for develooping endocarditis, maintain good oral health and seek prompt medical attention for any infections involving the mouth, teeth, gums, skin or urinary tract.    Always notify your doctor or dentist about your underlying heart valve condition before having any invasive procedures. You will need to take antibiotics before certain procedures, including all routine dental cleanings or other dental procedures.  Your cardiologist or dentist should prescribe these antibiotics for you to be taken ahead of time.      

## 2023-04-03 ENCOUNTER — Encounter: Payer: Self-pay | Admitting: Cardiovascular Disease

## 2023-04-03 ENCOUNTER — Telehealth: Payer: Self-pay | Admitting: Cardiology

## 2023-04-03 ENCOUNTER — Ambulatory Visit: Payer: 59 | Attending: Cardiovascular Disease

## 2023-04-03 DIAGNOSIS — Z952 Presence of prosthetic heart valve: Secondary | ICD-10-CM | POA: Diagnosis not present

## 2023-04-03 DIAGNOSIS — Z7901 Long term (current) use of anticoagulants: Secondary | ICD-10-CM | POA: Diagnosis not present

## 2023-04-03 DIAGNOSIS — I4891 Unspecified atrial fibrillation: Secondary | ICD-10-CM | POA: Diagnosis not present

## 2023-04-03 LAB — POCT INR: INR: 1 — AB (ref 2.0–3.0)

## 2023-04-03 NOTE — Telephone Encounter (Signed)
*  STAT* If patient is at the pharmacy, call can be transferred to refill team.   1. Which medications need to be refilled? (please list name of each medication and dose if known) oxyCODONE (OXY IR/ROXICODONE) 5 MG immediate release tablet [[id number]]    2. Would you like to learn more about the convenience, safety, & potential cost savings by using the [facility]?    3. Are you open to using the [facility] (Type [facility]. ).   4. Which pharmacy/location (including street and city if local pharmacy) is medication to be sent to? [id number] [address], [place] [phone]    5. Do they need a 30 day or 90 day supply? Patient would like call back if it will be refilled.

## 2023-04-03 NOTE — Telephone Encounter (Signed)
Error

## 2023-04-03 NOTE — Telephone Encounter (Signed)
Spoke w/ pt.  She was given written rx for oxycodone on 03/27/23 for incisional chest pain. She states that she has been taking 1 in am, 1 in pm w/ Tylenol between doses. He current pain level is a 6, she just returned from rehab and she only has 2 tabs left. She states that she tries not to take it, but is now afraid of running out. Advised her that we do not typically fill pain meds and this is a written rx, but will send to [facility] for her recommendation. Pt states that she is able to come in to p/u written rx if needed.

## 2023-04-03 NOTE — Telephone Encounter (Signed)
Pt is requesting a refill on oxycodone, which expired off of pt's medication list. Would the provider like to refill this medication? Please address

## 2023-04-03 NOTE — Patient Instructions (Signed)
Take 2.5 tablets today only then continue 1 tablet Daily, except 2 tablets on Monday, Wednesday and Friday. Eat 3 helpings of greens per week.   INR in 1 week.  [phone]

## 2023-04-04 ENCOUNTER — Telehealth: Payer: Self-pay | Admitting: Cardiovascular Disease

## 2023-04-04 ENCOUNTER — Other Ambulatory Visit: Payer: Self-pay | Admitting: Physician Assistant

## 2023-04-04 MED ORDER — OXYCODONE HCL 5 MG PO TABS: 5.0000 mg | ORAL_TABLET | Freq: Four times a day (QID) | ORAL | 0 refills | Status: DC | PRN

## 2023-04-04 NOTE — Progress Notes (Signed)
Patient contacted the office about pain medication refill.  She was just evaluated in our office by Aloha Gell PA-C at which time the patient stated she was still taking pain medication up to 3 times per day.  Patient is afraid of running out per documentation.  At this point from surgery she should not be requiring continued use of Narcotics.  I will give a final refill of 5 mg every 6 hours for pain Disp #15.  She will need to transition to Tylenol only after this point.    However I want to mention it was documented she did not receive pain medication at discharge.  This is false.... she was provided a script on   1/31 Oxy 5 mg every 6 hours prn pain Disp# 28  Refilled on 2/5  Dispo #20  And final RX today of # 15    Kei Langhorst, PA-C

## 2023-04-04 NOTE — Telephone Encounter (Signed)
She will need to get her refills from [facility]. She was given a short supply until her follow up appointment with surgery only because she was discharged from the hospital with no pain medications.

## 2023-04-04 NOTE — Telephone Encounter (Signed)
Pt and Penny Mccullough made aware of the follow recommendations. Number to [facility] phone number provided.   Charlsie Quest, NP to Marilynne Halsted, RN     04/04/23  7:35 AM Note She will need to get her refills from [facility]. She was given a short supply until her follow up appointment with surgery only because she was discharged from the hospital with no pain medications.

## 2023-04-04 NOTE — Telephone Encounter (Signed)
   Pt c/o of Chest Pain: STAT if active CP, including tightness, pressure, jaw pain, radiating pain to shoulder/upper arm/back, CP unrelieved by Nitro. Symptoms reported of SOB, nausea, vomiting, sweating.  1. Are you having CP right now?   No  2. Are you experiencing any other symptoms (ex. SOB, nausea, vomiting, sweating)?   No  3. Is your CP continuous or coming and going?   Coming and going  4. Have you taken Nitroglycerin?   No  5. How long have you been experiencing CP?   Since she had her surgery in January  6. If NO CP at time of call then end call with telling Pt to call back or call 911 if Chest pain returns prior to return call from triage team.   Caller [name]) stated patient has been having chest pains (7 out of 10) when she moves around.  Caller noted patient's incisional area looks good.  Caller stated patient is out of oxycodone and wants advice on next steps.

## 2023-04-05 ENCOUNTER — Other Ambulatory Visit: Payer: Self-pay | Admitting: Physician Assistant

## 2023-04-05 MED ORDER — OXYCODONE HCL 5 MG PO TABS: 5.0000 mg | ORAL_TABLET | Freq: Four times a day (QID) | ORAL | 0 refills | Status: DC | PRN

## 2023-04-10 ENCOUNTER — Ambulatory Visit: Payer: 59

## 2023-04-13 ENCOUNTER — Other Ambulatory Visit: Payer: Self-pay | Admitting: Surgical

## 2023-04-17 ENCOUNTER — Ambulatory Visit: Payer: 59 | Attending: Cardiovascular Disease

## 2023-04-17 ENCOUNTER — Ambulatory Visit (INDEPENDENT_AMBULATORY_CARE_PROVIDER_SITE_OTHER): Payer: 59

## 2023-04-17 DIAGNOSIS — Z7901 Long term (current) use of anticoagulants: Secondary | ICD-10-CM

## 2023-04-17 DIAGNOSIS — Z952 Presence of prosthetic heart valve: Secondary | ICD-10-CM | POA: Diagnosis not present

## 2023-04-17 DIAGNOSIS — I4891 Unspecified atrial fibrillation: Secondary | ICD-10-CM | POA: Diagnosis not present

## 2023-04-17 LAB — ECHOCARDIOGRAM LIMITED
AV Mean grad: 7 mm[Hg]
AV Peak grad: 12.8 mm[Hg]
Ao pk vel: 1.79 m/s
Area-P 1/2: 3.31 cm2
S' Lateral: 3.8 cm

## 2023-04-17 LAB — POCT INR: INR: 2.8 (ref 2.0–3.0)

## 2023-04-17 NOTE — Patient Instructions (Signed)
 continue 1 tablet Daily, except 2 tablets on Monday, Wednesday and Friday. Eat 3 helpings of greens per week.   INR in 2 weeks.  [phone]

## 2023-04-18 ENCOUNTER — Telehealth: Payer: Self-pay | Admitting: Cardiovascular Disease

## 2023-04-18 ENCOUNTER — Ambulatory Visit: Payer: 59 | Admitting: Thoracic Surgery (Cardiothoracic Vascular Surgery)

## 2023-04-18 ENCOUNTER — Other Ambulatory Visit: Payer: Self-pay

## 2023-04-18 MED ORDER — TRAMADOL HCL 50 MG PO TABS: 50.0000 mg | ORAL_TABLET | Freq: Two times a day (BID) | ORAL | 0 refills | Status: AC | PRN

## 2023-04-18 NOTE — Telephone Encounter (Signed)
   Pt c/o of Chest Pain: STAT if active CP, including tightness, pressure, jaw pain, radiating pain to shoulder/upper arm/back, CP unrelieved by Nitro. Symptoms reported of SOB, nausea, vomiting, sweating.  1. Are you having CP right now? Patient had open heart surgery on 03-06-23 and still having a lot of pain where the incision is   2. Are you experiencing any other symptoms (ex. SOB, nausea, vomiting, sweating)? no   3. Is your CP continuous or coming and going? Comes and goes   4. Have you taken Nitroglycerin?    5. How long have you been experiencing CP?  Since her surgery- patient has been on Oxycodone , has 1 left- wants to get off off that. She says Tylenol is not working- The nurse wants Dr Mariah Milling to recommend something that will help the pain.  6. If NO CP at time of call then end call with telling Pt to call back or call 911 if Chest pain returns prior to return call from triage team.

## 2023-04-18 NOTE — Progress Notes (Signed)
 HHRN Nancy contacted the office stating patient is still complaining of chest incisional pain. She states that patient has one pill left and does not want to use medication as it is "addicting". She was inquiring about a different kind to take. Advised that patient cannot take Ibuprofen due to taking Coumadin. She is continuing to take Tylenol. Spoke with [place] whom states she can take if wanted, Tramadol 50 mg PO every 12 hrs PRN # 20. Will call into [facility] [address] HHRN to contact patient to make aware.

## 2023-04-18 NOTE — Telephone Encounter (Signed)
 I spoke to Nurse [name] from [facility] and recommended that the patient contact [facility] due to continuing pain at the incision site. [name] verbalized understanding.

## 2023-04-22 ENCOUNTER — Encounter: Payer: Self-pay | Admitting: Emergency Medicine

## 2023-05-01 ENCOUNTER — Ambulatory Visit: Payer: 59 | Attending: Cardiovascular Disease

## 2023-05-01 DIAGNOSIS — Z7901 Long term (current) use of anticoagulants: Secondary | ICD-10-CM | POA: Diagnosis not present

## 2023-05-01 DIAGNOSIS — Z952 Presence of prosthetic heart valve: Secondary | ICD-10-CM | POA: Diagnosis not present

## 2023-05-01 DIAGNOSIS — I4891 Unspecified atrial fibrillation: Secondary | ICD-10-CM | POA: Diagnosis not present

## 2023-05-01 LAB — POCT INR: INR: 2.5 (ref 2.0–3.0)

## 2023-05-01 NOTE — Patient Instructions (Signed)
 continue 1 tablet Daily, except 2 tablets on Monday, Wednesday and Friday. Eat 3 helpings of greens per week.   INR in 4 weeks.  [phone]

## 2023-05-05 NOTE — Progress Notes (Unsigned)
 Cardiology Office Note  Date:  05/06/2023   ID:  Penny Mccullough, DOB [birth year], MRN [id number]  PCP:  Patient, No Pcp Per   Chief Complaint  Patient presents with   3 month follow up     Patient c/o burning in chest that radiates into her the right side of her neck.     HPI:  Penny Mccullough is a 78 y.o. female with a hx of  CAD  remote CABG approximately 30 years ago (suspected to be a LIMA-LAD per history),  CVA,  HLD,  COPD /tobacco /chronic bronchitis Ejection fraction 45 to 50% Moderate MR, severely dilated left atrium NSTEMI s/p PCI to ostial and mid LAD 10/28/20  Hard of hearing Atrial fibrillation in December 2022 cardioversion Mar 16, 2021, normal sinus rhythm restored MVR Depressed ejection fraction 1 week after surgery 25 to 30% Subsequent echo several weeks later EF 45 to 50% Who presents for follow-up of her coronary disease, PCI to ostial and mid LAD, atrial fibrillation (diagnosed December 2022), pulmonary hypertension  Last seen by myself in clinic December 2024 Seen by one of our providers March 27, 2023  Followed by advanced heart failure clinic  Underwent mitral valve surgery January 2025  Dr. Leafy Ro   for Severe MR/Afib, MVR   Still recovering from surgery 2 months ago, occasional pain in the chest on movement or if she overdoes it in the house  Maintaining normal sinus rhythm Feels her breathing is improved  EKG personally reviewed by myself on todays visit EKG Interpretation Date/Time:  Monday May 06 2023 11:49:12 EDT Ventricular Rate:  66 PR Interval:  146 QRS Duration:  78 QT Interval:  450 QTC Calculation: 471 R Axis:   3  Text Interpretation: Normal sinus rhythm When compared with ECG of 27-Mar-2023 10:44, T wave inversion no longer evident in Anterior leads Confirmed by [name] [phone]) on 05/06/2023 11:54:16 AM   History reviewed Last seen by myself in clinic February 06, 2021 Noted to be in atrial fibrillation at that time,  rate 119 bpm, asymptomatic Entresto was held, started on low-dose digoxin, metoprolol succinate increased, Entresto held, Lasix 40 continued  September 2022 NSTEMI s/p PCI to ostial and mid LAD 10/28/20.   Echo showed EF 45-50% with HK of basal to mid inferior and inferolateral wall.   remote CABG x 1, at which time she was told "they took a main artery from my chest," and appears to be a LIMA to LAD.     PMH:   has a past medical history of CAD (coronary artery disease), CHF (congestive heart failure) (HCC), COPD (chronic obstructive pulmonary disease) (HCC), CVA (cerebral vascular accident) (HCC), Dysrhythmia, Heart murmur, History of kidney stones, HLD (hyperlipidemia), Hypertension, and MI (myocardial infarction) (HCC) (10/2020).  PSH:    Past Surgical History:  Procedure Laterality Date   CARDIOVERSION N/A 03/16/2021   Procedure: CARDIOVERSION;  Surgeon: Antonieta Iba, MD;  Location: [facility] ORS;  Service: Cardiovascular;  Laterality: N/A;   CARDIOVERSION N/A 03/19/2023   Procedure: CARDIOVERSION;  Surgeon: Romie Minus, MD;  Location: [facility] INVASIVE CV LAB;  Service: Cardiovascular;  Laterality: N/A;   CORONARY ARTERY BYPASS GRAFT  1989   CORONARY STENT INTERVENTION N/A 10/28/2020   Procedure: CORONARY STENT INTERVENTION;  Surgeon: Alwyn Pea, MD;  Location: [facility] INVASIVE CV LAB;  Service: Cardiovascular;  Laterality: N/A;   LEFT HEART CATH AND CORONARY ANGIOGRAPHY N/A 10/28/2020   Procedure: LEFT HEART CATH AND CORONARY ANGIOGRAPHY;  Surgeon: Dorothyann Peng  D, MD;  Location: [facility] INVASIVE CV LAB;  Service: Cardiovascular;  Laterality: N/A;   MITRAL VALVE REPLACEMENT N/A 03/06/2023   Procedure: MITRAL VALVE (MV) REPLACEMENT, USING 29 MM MEDTRONIC MOSAIC PORCINE HEART VALVE;  Surgeon: Eugenio Hoes, MD;  Location: [facility] OR;  Service: Open Heart Surgery;  Laterality: N/A;   RIGHT HEART CATH Right 01/22/2023   Procedure: RIGHT HEART CATH;  Surgeon: Yvonne Kendall, MD;  Location:  [facility] INVASIVE CV LAB;  Service: Cardiovascular;  Laterality: Right;   RIGHT/LEFT HEART CATH AND CORONARY ANGIOGRAPHY N/A 04/18/2022   Procedure: RIGHT/LEFT HEART CATH AND CORONARY ANGIOGRAPHY;  Surgeon: Orbie Pyo, MD;  Location: [facility] INVASIVE CV LAB;  Service: Cardiovascular;  Laterality: N/A;   TEE WITHOUT CARDIOVERSION N/A 05/01/2022   Procedure: TRANSESOPHAGEAL ECHOCARDIOGRAM (TEE);  Surgeon: Wendall Stade, MD;  Location: [facility] ENDOSCOPY;  Service: Cardiovascular;  Laterality: N/A;   TEE WITHOUT CARDIOVERSION N/A 10/26/2022   Procedure: TRANSESOPHAGEAL ECHOCARDIOGRAM;  Surgeon: Wendall Stade, MD;  Location: [facility] INVASIVE CV LAB;  Service: Cardiovascular;  Laterality: N/A;   TEE WITHOUT CARDIOVERSION N/A 03/06/2023   Procedure: TRANSESOPHAGEAL ECHOCARDIOGRAM;  Surgeon: Eugenio Hoes, MD;  Location: [facility] OR;  Service: Open Heart Surgery;  Laterality: N/A;   TRICUSPID VALVE REPLACEMENT  03/06/2023   Procedure: TRICUSPID VALVE REPAIR, USING 30 MM MC3 ANNULOPLASTY RING;  Surgeon: Eugenio Hoes, MD;  Location: [facility] OR;  Service: Open Heart Surgery;;    Current Outpatient Medications  Medication Sig Dispense Refill   acetaminophen (TYLENOL) 500 MG tablet Take 500-1,000 mg by mouth every 6 (six) hours as needed for moderate pain (pain score 4-6) or headache.     albuterol (VENTOLIN HFA) 108 (90 Base) MCG/ACT inhaler Inhale 2 puffs into the lungs every 6 (six) hours as needed for wheezing or shortness of breath. 8 g 0   amiodarone (PACERONE) 200 MG tablet Take 200 mg by mouth daily.     aspirin 81 MG chewable tablet Chew 1 tablet (81 mg total) by mouth daily.     atorvastatin (LIPITOR) 80 MG tablet Take 1 tablet (80 mg total) by mouth daily. 90 tablet 3   empagliflozin (JARDIANCE) 10 MG TABS tablet TAKE 1 TABLET BY MOUTH EVERY DAY BEFORE BREAKFAST 30 tablet 11   ezetimibe (ZETIA) 10 MG tablet Take 1 tablet (10 mg total) by mouth daily. 90 tablet 3   furosemide (LASIX) 40 MG tablet Take 1 tablet (40 mg  total) by mouth daily. 30 tablet 1   losartan (COZAAR) 25 MG tablet Take 1 tablet (25 mg total) by mouth daily. 30 tablet 1   potassium chloride SA (KLOR-CON M) 20 MEQ tablet Take 1 tablet (20 mEq total) by mouth daily. 30 tablet 1   spironolactone (ALDACTONE) 25 MG tablet Take 1 tablet (25 mg total) by mouth daily. 30 tablet 1   traMADol (ULTRAM) 50 MG tablet Take 1 tablet (50 mg total) by mouth every 12 (twelve) hours as needed for up to 20 doses. 20 tablet 0   warfarin (COUMADIN) 2 MG tablet Take 2 tablets (4 mg total) by mouth daily. Adjust as directed by coumadin clinic 100 tablet 2   Dimethicone (BOUDREAUXS BUTT PASTE) 1 % OINT Apply 1 Application topically 3 (three) times daily as needed. (Patient not taking: Reported on 05/06/2023) 113 g 0   No current facility-administered medications for this visit.    Allergies:   Codeine   Social History:  The patient  reports that she quit smoking about 36 years ago. Her smoking  use included cigarettes. She started smoking about 66 years ago. She has a 30 pack-year smoking history. She has never used smokeless tobacco. She reports that she does not currently use alcohol. She reports that she does not use drugs.   Family History:   family history includes Hypertension in her mother.    Review of Systems: Review of Systems  Constitutional: Negative.   HENT: Negative.    Respiratory: Negative.    Cardiovascular: Negative.   Gastrointestinal: Negative.   Musculoskeletal: Negative.   Neurological: Negative.   Psychiatric/Behavioral: Negative.    All other systems reviewed and are negative.  2-minute PHYSICAL EXAM: VS:  BP 120/70 (BP Location: Left Arm, Patient Position: Sitting, Cuff Size: Normal)   Pulse 66   Ht 5' (1.524 m)   Wt 123 lb 8 oz (56 kg)   SpO2 97%   BMI 24.12 kg/m  , BMI Body mass index is 24.12 kg/m. Constitutional:  oriented to person, place, and time. No distress.  HENT:  Head: Grossly normal Eyes:  no discharge. No  scleral icterus.  Neck: No JVD, no carotid bruits  Cardiovascular: Regular rate and rhythm, no murmurs appreciated Pulmonary/Chest: Clear to auscultation bilaterally, no wheezes or rails Abdominal: Soft.  no distension.  no tenderness.  Musculoskeletal: Normal range of motion Neurological:  normal muscle tone. Coordination normal. No atrophy Skin: Skin warm and dry Psychiatric: normal affect, pleasant   Recent Labs: 07/19/2022: TSH 3.144 08/15/2022: NT-Pro BNP 435 02/05/2023: BNP 519.0 03/04/2023: ALT 25 03/22/2023: Magnesium 2.3 03/27/2023: BUN 21; Creatinine, Ser 1.08; Hemoglobin 11.3; Platelets 644; Potassium 5.2; Sodium 144    Lipid Panel Lab Results  Component Value Date   CHOL 53 03/07/2023   HDL 31 (L) 03/07/2023   LDLCALC 14 03/07/2023   TRIG 42 03/07/2023      Wt Readings from Last 3 Encounters:  05/06/23 123 lb 8 oz (56 kg)  04/01/23 124 lb (56.2 kg)  03/27/23 125 lb 6.4 oz (56.9 kg)     ASSESSMENT AND PLAN:  Problem List Items Addressed This Visit       Cardiology Problems   Atrial fibrillation with RVR (HCC) - Primary   Relevant Medications   amiodarone (PACERONE) 200 MG tablet   Other Relevant Orders   EKG 12-Lead (Completed)   CAD (coronary artery disease)   Relevant Medications   amiodarone (PACERONE) 200 MG tablet   Other Relevant Orders   EKG 12-Lead (Completed)   Dilated cardiomyopathy (HCC)   Relevant Medications   amiodarone (PACERONE) 200 MG tablet   Other Relevant Orders   EKG 12-Lead (Completed)     Other   S/P MVR (mitral valve replacement)   Relevant Orders   EKG 12-Lead (Completed)   COPD (chronic obstructive pulmonary disease) (HCC)   Other Visit Diagnoses       Ischemic cardiomyopathy       Relevant Medications   amiodarone (PACERONE) 200 MG tablet   Other Relevant Orders   EKG 12-Lead (Completed)     Severe mitral valve regurgitation       Relevant Medications   amiodarone (PACERONE) 200 MG tablet   Other Relevant Orders    EKG 12-Lead (Completed)     Chronic systolic heart failure (HCC)       Relevant Medications   amiodarone (PACERONE) 200 MG tablet      Coronary disease with stable angina History of PCI to ostial and mid LAD Nonobstructive disease on heart catheterization February 2024 Currently with no symptoms of  angina. No further workup at this time. Continue current medication regimen.  Atrial fibrillation with RVR December 2022,  cardioversion January 2023, recurrent A-fib August 2023 Started on amiodarone November 2023 converting to normal sinus rhythm Recommend she continue amiodarone, metoprolol warfarin  Severe mitral valve regurgitation S/p MVR Ejection fraction 45 to 50%, mild MR 2 months postsurgery, recovering well  Pulmonary hypertension Elevated right heart pressures on right heart catheterization January 22, 2023 continue Lasix 40 daily with spironolactone 12.5 daily Stable BMP February If potassium continues to run high may need to decrease potassium dosing   Signed, Dossie Arbour, M.D., Ph.D. [facility] [place], [place] [phone]

## 2023-05-06 ENCOUNTER — Encounter: Payer: Self-pay | Admitting: Cardiovascular Disease

## 2023-05-06 ENCOUNTER — Ambulatory Visit: Payer: 59 | Attending: Cardiovascular Disease | Admitting: Cardiovascular Disease

## 2023-05-06 VITALS — BP 120/70 | HR 66 | Ht 60.0 in | Wt 123.5 lb

## 2023-05-06 DIAGNOSIS — Z952 Presence of prosthetic heart valve: Secondary | ICD-10-CM

## 2023-05-06 DIAGNOSIS — J449 Chronic obstructive pulmonary disease, unspecified: Secondary | ICD-10-CM | POA: Diagnosis not present

## 2023-05-06 DIAGNOSIS — I420 Dilated cardiomyopathy: Secondary | ICD-10-CM

## 2023-05-06 DIAGNOSIS — I4891 Unspecified atrial fibrillation: Secondary | ICD-10-CM | POA: Diagnosis not present

## 2023-05-06 DIAGNOSIS — I340 Nonrheumatic mitral (valve) insufficiency: Secondary | ICD-10-CM

## 2023-05-06 DIAGNOSIS — I25118 Atherosclerotic heart disease of native coronary artery with other forms of angina pectoris: Secondary | ICD-10-CM

## 2023-05-06 DIAGNOSIS — I255 Ischemic cardiomyopathy: Secondary | ICD-10-CM | POA: Diagnosis not present

## 2023-05-06 DIAGNOSIS — I5022 Chronic systolic (congestive) heart failure: Secondary | ICD-10-CM

## 2023-05-06 MED ORDER — ATORVASTATIN CALCIUM 80 MG PO TABS
80.0000 mg | ORAL_TABLET | Freq: Every day | ORAL | 3 refills | Status: AC
Start: 2023-05-06 — End: ?

## 2023-05-06 MED ORDER — EZETIMIBE 10 MG PO TABS: 10.0000 mg | ORAL_TABLET | Freq: Every day | ORAL | 3 refills | Status: AC

## 2023-05-06 MED ORDER — EMPAGLIFLOZIN 10 MG PO TABS: ORAL_TABLET | ORAL | 3 refills | Status: AC

## 2023-05-06 NOTE — Patient Instructions (Addendum)
 Medication Instructions:  No changes  If you need a refill on your cardiac medications before your next appointment, please call your pharmacy.    Lab work: No new labs needed   Testing/Procedures: No new testing needed   Follow-Up: At [facility], you and your health needs are our priority.  As part of our continuing mission to provide you with exceptional heart care, we have created designated Provider Care Teams.  These Care Teams include your primary Cardiologist (physician) and Advanced Practice Providers (APPs -  Physician Assistants and Nurse Practitioners) who all work together to provide you with the care you need, when you need it.  You will need a follow up appointment in 6 months  Providers on your designated Care Team:   Nicolasa Ducking, NP Eula Listen, PA-C Cadence Fransico Michael, [place]  COVID-19 Vaccine Information can be found at: [email or web address] For questions related to vaccine distribution or appointments, please email [email or web address] or call [phone].

## 2023-05-14 ENCOUNTER — Other Ambulatory Visit: Payer: Self-pay | Admitting: Surgical

## 2023-05-15 NOTE — Progress Notes (Unsigned)
 [address]       [place] [address]             [phone]           Aerika Groll [facility] Medical Record #[id number] Date of Birth: December 03, 1945  Orbie Pyo, MD Patient, No Pcp Per  Chief Complaint:   follow up MVR and TV repair redo  History of Present Illness:     Pt is now more than 2 months out from above. She has some ongoing chest wall discomforts that are improving and still feels a day after doing activities she is tired. 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 She has remained on the amiodarone and coumadin. She is eating well. She has not joined cardiac rehab      Past Medical History:  Diagnosis Date   CAD (coronary artery disease)    CHF (congestive heart failure) (HCC)    COPD (chronic obstructive pulmonary disease) (HCC)    CVA (cerebral vascular accident) (HCC)    Dysrhythmia    A.Fib   Heart murmur    History of kidney stones    HLD (hyperlipidemia)    Hypertension    MI (myocardial infarction) (HCC) 10/2020    Past Surgical History:  Procedure Laterality Date   CARDIOVERSION N/A 03/16/2021   Procedure: CARDIOVERSION;  Surgeon: Antonieta Iba, MD;  Location: [facility] ORS;  Service: Cardiovascular;  Laterality: N/A;   CARDIOVERSION N/A 03/19/2023   Procedure: CARDIOVERSION;  Surgeon: Romie Minus, MD;  [facility]: [facility] INVASIVE CV LAB;  Service: Cardiovascular;  Laterality: N/A;   CORONARY ARTERY BYPASS GRAFT  1989   CORONARY STENT INTERVENTION N/A 10/28/2020   Procedure: CORONARY STENT INTERVENTION;  Surgeon: Alwyn Pea, MD;  Location: [facility] INVASIVE CV LAB;  Service: Cardiovascular;  Laterality: N/A;   LEFT HEART CATH AND CORONARY ANGIOGRAPHY N/A 10/28/2020   Procedure: LEFT HEART CATH AND CORONARY ANGIOGRAPHY;  Surgeon: Alwyn Pea, MD;  Location: [facility] INVASIVE CV LAB;  Service: Cardiovascular;  Laterality: N/A;   MITRAL VALVE REPLACEMENT N/A 03/06/2023    Procedure: MITRAL VALVE (MV) REPLACEMENT, USING 29 MM MEDTRONIC MOSAIC PORCINE HEART VALVE;  Surgeon: Eugenio Hoes, MD;  Location: [facility] OR;  Service: Open Heart Surgery;  Laterality: N/A;   RIGHT HEART CATH Right 01/22/2023   Procedure: RIGHT HEART CATH;  Surgeon: Yvonne Kendall, MD;  Location: [facility] INVASIVE CV LAB;  Service: Cardiovascular;  Laterality: Right;   RIGHT/LEFT HEART CATH AND CORONARY ANGIOGRAPHY N/A 04/18/2022   Procedure: RIGHT/LEFT HEART CATH AND CORONARY ANGIOGRAPHY;  Surgeon: Orbie Pyo, MD;  Location: [facility] LAB;  Service: Cardiovascular;  Laterality: N/A;   TEE WITHOUT CARDIOVERSION N/A 05/01/2022   Procedure: TRANSESOPHAGEAL ECHOCARDIOGRAM (TEE);  Surgeon: Wendall Stade, MD;  Location: [facility] ENDOSCOPY;  Service: Cardiovascular;  Laterality: N/A;   TEE WITHOUT CARDIOVERSION N/A 10/26/2022   Procedure: TRANSESOPHAGEAL ECHOCARDIOGRAM;  Surgeon: Wendall Stade, MD;  Location: [facility] INVASIVE CV LAB;  Service: Cardiovascular;  Laterality: N/A;   TEE WITHOUT CARDIOVERSION N/A 03/06/2023   Procedure: TRANSESOPHAGEAL ECHOCARDIOGRAM;  Surgeon: Eugenio Hoes, MD;  Location: [facility] OR;  Service: Open Heart Surgery;  Laterality: N/A;   TRICUSPID VALVE REPLACEMENT  03/06/2023   Procedure: TRICUSPID VALVE REPAIR, USING 30 MM MC3 ANNULOPLASTY RING;  Surgeon: Eugenio Hoes, MD;  Location: [facility] OR;  Service: Open Heart Surgery;;    Social History   Tobacco Use  Smoking Status Former   Current packs/day: 0.00   Average packs/day: 1 pack/day for 30.0 years (30.0 ttl pk-yrs)   Types: Cigarettes   Start date: 02/20/1957   Quit date: 02/21/1987   Years since quitting: 36.2  Smokeless Tobacco Never    Social History   Substance and Sexual Activity  Alcohol Use Not Currently    Social History   Socioeconomic History   Marital status: Single    Spouse name: Not on file   Number of children: Not on file   Years of education: Not on file   Highest education level: Not on file   Occupational History   Not on file  Tobacco Use   Smoking status: Former    Current packs/day: 0.00    Average packs/day: 1 pack/day for 30.0 years (30.0 ttl pk-yrs)    Types: Cigarettes    Start date: 02/20/1957    Quit date: 02/21/1987    Years since quitting: 36.2   Smokeless tobacco: Never  Vaping Use   Vaping status: Never Used  Substance and Sexual Activity   Alcohol use: Not Currently   Drug use: Never   Sexual activity: Not Currently  Other Topics Concern   Not on file  Social History Narrative   Not on file   Social Drivers of Health   Financial Resource Strain: Not on file  Food Insecurity: No Food Insecurity (03/07/2023)   Hunger Vital Sign    Worried About Running Out of Food in the Last Year: Never true    Ran Out of Food in the Last Year: Never true  Transportation Needs: No Transportation Needs (03/07/2023)   PRAPARE - Administrator, Civil Service (Medical): No    Lack of Transportation (Non-Medical): No  Physical Activity: Not on file  Stress: Not on file  Social Connections: Unknown (03/07/2023)   Social Connection and Isolation Panel [NHANES]    Frequency of Communication with Friends and Family: More than three times a week    Frequency of Social Gatherings with Friends and Family: More than three times a week    Attends Religious Services: Patient declined    Database administrator or Organizations: Patient declined    Attends Banker Meetings: Patient declined    Marital Status: Patient declined  Intimate Partner Violence: Not At Risk (03/07/2023)   Humiliation, Afraid, Rape, and Kick questionnaire    Fear of Current or Ex-Partner: No    Emotionally Abused: No    Physically Abused: No    Sexually Abused: No    Allergies  Allergen Reactions   Codeine Nausea And Vomiting    Current Outpatient Medications  Medication Sig Dispense Refill   acetaminophen (TYLENOL) 500 MG tablet Take 500-1,000 mg by mouth every 6 (six) hours  as needed for moderate pain (pain score 4-6) or headache.     albuterol (VENTOLIN HFA) 108 (90 Base) MCG/ACT inhaler Inhale 2 puffs into the lungs every 6 (six) hours as needed for wheezing or shortness of breath. 8 g 0   amiodarone (PACERONE) 200 MG tablet Take 200 mg by mouth daily.     aspirin 81 MG chewable tablet Chew 1 tablet (81 mg total) by mouth daily.     atorvastatin (LIPITOR) 80 MG tablet Take 1 tablet (80 mg total) by mouth daily. 90 tablet 3   Dimethicone (BOUDREAUXS BUTT PASTE) 1 % OINT Apply 1 Application topically 3 (three) times daily as needed. (Patient not taking: Reported on 05/06/2023) 113  g 0   empagliflozin (JARDIANCE) 10 MG TABS tablet TAKE 1 TABLET BY MOUTH EVERY DAY BEFORE BREAKFAST 90 tablet 3   ezetimibe (ZETIA) 10 MG tablet Take 1 tablet (10 mg total) by mouth daily. 90 tablet 3   furosemide (LASIX) 40 MG tablet Take 1 tablet (40 mg total) by mouth daily. 30 tablet 1   losartan (COZAAR) 25 MG tablet Take 1 tablet (25 mg total) by mouth daily. 30 tablet 1   potassium chloride SA (KLOR-CON M) 20 MEQ tablet Take 1 tablet (20 mEq total) by mouth daily. 30 tablet 1   spironolactone (ALDACTONE) 25 MG tablet Take 1 tablet (25 mg total) by mouth daily. 30 tablet 1   traMADol (ULTRAM) 50 MG tablet Take 1 tablet (50 mg total) by mouth every 12 (twelve) hours as needed for up to 20 doses. 20 tablet 0   warfarin (COUMADIN) 2 MG tablet Take 2 tablets (4 mg total) by mouth daily. Adjust as directed by coumadin clinic 100 tablet 2   No current facility-administered medications for this visit.     Family History  Problem Relation Age of Onset   Hypertension Mother        Physical Exam: Looks healthy Lungs; clear Card: rr with no murmurs Ext: warm  Neuro: intact     Diagnostic Studies & Laboratory data: I have personally reviewed the following studies and agree with the findings   TTE (03/2023) IMPRESSIONS     1. Left ventricular ejection fraction, by estimation,  is 45 to 50%. Left  ventricular ejection fraction by 3D volume is 46 %. The left ventricle has  mildly decreased function. The left ventricle demonstrates regional wall  motion abnormalities (mild  inferior wall hypokinesis). Left ventricular diastolic parameters were  normal. The average left ventricular global longitudinal strain is -17.1  %. The global longitudinal strain is normal.   2. Right ventricular systolic function is normal. The right ventricular  size is normal. Tricuspid regurgitation signal is inadequate for assessing  PA pressure.   3. The mitral valve has been repaired/replaced. Mild mitral valve  regurgitation. No evidence of mitral stenosis. The mean mitral valve  gradient is 3.0 mmHg.   4. The aortic valve is normal in structure. Aortic valve regurgitation is  mild. No aortic stenosis is present.   5. The inferior vena cava is normal in size with greater than 50%  respiratory variability, suggesting right atrial pressure of 3 mmHg.   6. Left atrial size was moderately dilated.   FINDINGS   Left Ventricle: Left ventricular ejection fraction, by estimation, is 45  to 50%. Left ventricular ejection fraction by 3D volume is 46 %. The left  ventricle has mildly decreased function. The left ventricle demonstrates  regional wall motion  abnormalities. The average left ventricular global longitudinal strain is  -17.1 %. Strain was performed and the global longitudinal strain is  normal. The left ventricular internal cavity size was normal in size.  There is no left ventricular hypertrophy.  Left ventricular diastolic parameters were normal.   Right Ventricle: The right ventricular size is normal. No increase in  right ventricular wall thickness. Right ventricular systolic function is  normal. Tricuspid regurgitation signal is inadequate for assessing PA  pressure.   Left Atrium: Left atrial size was moderately dilated.   Right Atrium: Right atrial size was normal in  size.   Pericardium: There is no evidence of pericardial effusion.   Mitral Valve: The mitral valve has been repaired/replaced. Mild  mitral  valve regurgitation. 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 No evidence of mitral valve stenosis. MV peak  gradient, 8.3 mmHg. The mean mitral valve  gradient is 3.0 mmHg.   Tricuspid Valve: The tricuspid valve is normal in structure. Tricuspid  valve regurgitation is not demonstrated. No evidence of tricuspid  stenosis.   Aortic Valve: The aortic valve is normal in structure. Aortic valve  regurgitation is mild. No aortic stenosis is present. Aortic valve mean  gradient measures 7.0 mmHg. Aortic valve peak gradient measures 12.8 mmHg.   Pulmonic Valve: The pulmonic valve was normal in structure. Pulmonic valve  regurgitation is not visualized. No evidence of pulmonic stenosis.   Aorta: The aortic root is normal in size and structure.   Venous: The inferior vena cava is normal in size with greater than 50%  respiratory variability, suggesting right atrial pressure of 3 mmHg.   IAS/Shunts: No atrial level shunt detected by color flow Doppler.   Additional Comments: 3D was performed not requiring image post processing  on an independent workstation and was abnormal.    LEFT VENTRICLE  PLAX 2D  LVIDd:         4.60 cm         Diastology  LVIDs:         3.80 cm         LV e' medial:    6.85 cm/s  LV PW:         0.90 cm         LV E/e' medial:  20.1  LV IVS:        0.90 cm         LV e' lateral:   15.10 cm/s                                 LV E/e' lateral: 9.1                                   2D                                 Longitudinal                                 Strain                                 2D Strain GLS  -17.1 %                                 Avg:                                   3D Volume EF                                 LV 3D EF:    Left  ventricul                                              ar                                              ejection                                              fraction                                              by 3D                                              volume is                                              46 %.                                   3D Volume EF:                                 3D EF:        46 %                                 LV EDV:       139 ml                                 LV ESV:       75 ml                                 LV SV:        64 ml   AORTIC VALVE  AV Vmax:           179.00 cm/s  AV Vmean:          118.000 cm/s  AV VTI:            0.339 m  AV Peak Grad:      12.8 mmHg  AV Mean Grad:      7.0 mmHg  LVOT Vmax:         176.00 cm/s  LVOT Vmean:        108.000 cm/s  LVOT VTI:          0.305 m  LVOT/AV VTI  ratio: 0.90   MITRAL VALVE  MV Area (PHT): 3.31 cm     SHUNTS  MV Peak grad:  8.3 mmHg     Systemic VTI: 0.30 m  MV Mean grad:  3.0 mmHg  MV Vmax:       1.44 m/s  MV Vmean:      83.1 cm/s  MV Decel Time: 229 msec  MV E velocity: 138.00 cm/s  MV A velocity: 125.00 cm/s  MV E/A ratio:  1.10   Recent Radiology Findings:       Recent Lab Findings: Lab Results  Component Value Date   WBC 6.2 03/27/2023   HGB 11.3 03/27/2023   HCT 36.6 03/27/2023   PLT 644 (H) 03/27/2023   GLUCOSE 104 (H) 03/27/2023   CHOL 53 03/07/2023   TRIG 42 03/07/2023   HDL 31 (L) 03/07/2023   LDLDIRECT 44 03/27/2023   LDLCALC 14 03/07/2023   ALT 25 03/04/2023   AST 21 03/04/2023   NA 144 03/27/2023   K 5.2 03/27/2023   CL 107 (H) 03/27/2023   CREATININE 1.08 (H) 03/27/2023   BUN 21 03/27/2023   CO2 20 03/27/2023   TSH 3.144 07/19/2022   INR 2.5 05/01/2023   HGBA1C 6.1 (H) 03/04/2023      Assessment / Plan:     Doing well. She is cleared for cardiac rehab. Will leave to cardiology when to stop amiodarone and switch back to a noac.  Restrictions removed from her activities. Encourage cardiac rehab. Will follow up prn   I have spent 30 min in review of the records, viewing studies and in face to face with patient and in coordination of future care    Eugenio Hoes 05/15/2023 1:14 PM

## 2023-05-16 ENCOUNTER — Encounter: Payer: Self-pay | Admitting: Thoracic Surgery (Cardiothoracic Vascular Surgery)

## 2023-05-16 ENCOUNTER — Ambulatory Visit (INDEPENDENT_AMBULATORY_CARE_PROVIDER_SITE_OTHER): Payer: 59 | Admitting: Thoracic Surgery (Cardiothoracic Vascular Surgery)

## 2023-05-16 VITALS — BP 134/75 | HR 64 | Resp 20 | Ht 60.0 in | Wt 123.0 lb

## 2023-05-16 DIAGNOSIS — Z95811 Presence of heart assist device: Secondary | ICD-10-CM | POA: Insufficient documentation

## 2023-05-16 DIAGNOSIS — Z952 Presence of prosthetic heart valve: Secondary | ICD-10-CM

## 2023-05-16 NOTE — Patient Instructions (Signed)
 Follow up prn

## 2023-05-20 ENCOUNTER — Telehealth: Payer: Self-pay | Admitting: Cardiovascular Disease

## 2023-05-20 MED ORDER — LOSARTAN POTASSIUM 25 MG PO TABS: 25.0000 mg | ORAL_TABLET | Freq: Every day | ORAL | 3 refills | Status: AC

## 2023-05-20 MED ORDER — FUROSEMIDE 40 MG PO TABS: 40.0000 mg | ORAL_TABLET | Freq: Every day | ORAL | 3 refills | Status: AC

## 2023-05-20 MED ORDER — POTASSIUM CHLORIDE CRYS ER 20 MEQ PO TBCR: 20.0000 meq | EXTENDED_RELEASE_TABLET | Freq: Every day | ORAL | 3 refills | Status: AC

## 2023-05-20 NOTE — Telephone Encounter (Signed)
*  STAT* If patient is at the pharmacy, call can be transferred to refill team.   1. Which medications need to be refilled? (please list name of each medication and dose if known)   losartan (COZAAR) 25 MG tablet   furosemide (LASIX) 40 MG tablet   potassium chloride SA (KLOR-CON M) 20 MEQ tablet   2. Would you like to learn more about the convenience, safety, & potential cost savings by using the [facility]? No   3. Are you open to using the [facility] (Type [facility]. ) No   4. Which pharmacy/location (including street and city if local pharmacy) is medication to be sent to?  [id number] - [place], [place] - [address]       5. Do they need a 30 day or 90 day supply? 90 day

## 2023-05-21 ENCOUNTER — Telehealth: Payer: Self-pay | Admitting: Cardiovascular Disease

## 2023-05-21 NOTE — Telephone Encounter (Signed)
 Pt c/o medication issue:  1. Name of Medication: losartan (COZAAR) 25 MG tablet and empagliflozin (JARDIANCE) 10 MG TABS tablet   2. How are you currently taking this medication (dosage and times per day)? As directed  3. Are you having a reaction (difficulty breathing--STAT)? no  4. What is your medication issue? Patient states that she just left her pharmacy and was told not to take these two medications together. Please advise.

## 2023-05-22 NOTE — Telephone Encounter (Signed)
 Not that I am aware of. They are part of guideline directed medical therapy for certain patient populations. She should take her medications are prescribed and if the pharmacy has any questions they should reach out.

## 2023-05-22 NOTE — Telephone Encounter (Signed)
 Pt called and Sheri's message relay.  Pt given our number to give to her pharmacist for any further discussion.  Pt questions answered about 3/17 office visit meds.

## 2023-05-29 ENCOUNTER — Ambulatory Visit: Attending: Cardiovascular Disease

## 2023-05-29 DIAGNOSIS — I4891 Unspecified atrial fibrillation: Secondary | ICD-10-CM

## 2023-05-29 DIAGNOSIS — Z7901 Long term (current) use of anticoagulants: Secondary | ICD-10-CM

## 2023-05-29 DIAGNOSIS — Z952 Presence of prosthetic heart valve: Secondary | ICD-10-CM | POA: Diagnosis not present

## 2023-05-29 LAB — POCT INR: INR: 1.3 — AB (ref 2.0–3.0)

## 2023-05-29 NOTE — Patient Instructions (Signed)
 Take 3 tablets tonight then continue 1 tablet Daily, except 2 tablets on Monday, Wednesday and Friday. Eat 3 helpings of greens per week.   INR in 3 weeks.  [phone]

## 2023-06-19 ENCOUNTER — Ambulatory Visit: Attending: Cardiovascular Disease

## 2023-06-19 DIAGNOSIS — Z7901 Long term (current) use of anticoagulants: Secondary | ICD-10-CM | POA: Diagnosis not present

## 2023-06-19 DIAGNOSIS — Z952 Presence of prosthetic heart valve: Secondary | ICD-10-CM

## 2023-06-19 DIAGNOSIS — I4891 Unspecified atrial fibrillation: Secondary | ICD-10-CM | POA: Diagnosis not present

## 2023-06-19 LAB — POCT INR: INR: 1.8 — AB (ref 2.0–3.0)

## 2023-06-19 NOTE — Patient Instructions (Signed)
 Take 2.5 tablets tonight then continue 1 tablet Daily, except 2 tablets on Monday, Wednesday and Friday. Eat 3 helpings of greens per week.   INR in 4 weeks.  [phone]

## 2023-07-04 ENCOUNTER — Other Ambulatory Visit: Payer: Self-pay | Admitting: Cardiology

## 2023-07-04 ENCOUNTER — Other Ambulatory Visit: Payer: Self-pay | Admitting: Cardiovascular Disease

## 2023-07-13 ENCOUNTER — Other Ambulatory Visit: Payer: Self-pay | Admitting: Cardiovascular Disease

## 2023-07-14 ENCOUNTER — Other Ambulatory Visit: Payer: Self-pay | Admitting: Surgical

## 2023-07-17 ENCOUNTER — Ambulatory Visit: Attending: Cardiovascular Disease

## 2023-07-17 DIAGNOSIS — Z7901 Long term (current) use of anticoagulants: Secondary | ICD-10-CM | POA: Diagnosis not present

## 2023-07-17 DIAGNOSIS — Z952 Presence of prosthetic heart valve: Secondary | ICD-10-CM

## 2023-07-17 DIAGNOSIS — I4891 Unspecified atrial fibrillation: Secondary | ICD-10-CM

## 2023-07-17 LAB — POCT INR: INR: 1.3 — AB (ref 2.0–3.0)

## 2023-07-17 NOTE — Patient Instructions (Signed)
 Take 3 tablets tonight then continue 1 tablet Daily, except 2 tablets on Monday, Wednesday and Friday. Eat 3 helpings of greens per week. Drink less V-8 juice.   INR in 3 weeks.  [phone]

## 2023-08-07 ENCOUNTER — Ambulatory Visit: Attending: Cardiovascular Disease

## 2023-08-07 DIAGNOSIS — Z952 Presence of prosthetic heart valve: Secondary | ICD-10-CM

## 2023-08-07 DIAGNOSIS — I4891 Unspecified atrial fibrillation: Secondary | ICD-10-CM

## 2023-08-07 DIAGNOSIS — Z7901 Long term (current) use of anticoagulants: Secondary | ICD-10-CM | POA: Diagnosis not present

## 2023-08-07 LAB — POCT INR: INR: 1.3 — AB (ref 2.0–3.0)

## 2023-08-07 NOTE — Patient Instructions (Signed)
 Take 3 tablets tonight only then Increase to 2 tablets daily, except 1 tablet every Tuesday and Thursday.  Eat 3 helpings of greens per week.    INR in 4 weeks.  [phone]

## 2023-08-10 ENCOUNTER — Other Ambulatory Visit: Payer: Self-pay | Admitting: Surgical

## 2023-08-12 ENCOUNTER — Telehealth: Payer: Self-pay | Admitting: Cardiovascular Disease

## 2023-08-12 DIAGNOSIS — I4891 Unspecified atrial fibrillation: Secondary | ICD-10-CM

## 2023-08-12 MED ORDER — WARFARIN SODIUM 2 MG PO TABS: ORAL_TABLET | ORAL | 1 refills | Status: DC

## 2023-08-12 NOTE — Telephone Encounter (Signed)
 Warfarin 2mg  refill S/P MVR (mitral valve replacement), Afib Last INR 08/07/23 Last OV 05/06/23

## 2023-08-12 NOTE — Telephone Encounter (Signed)
*  STAT* If patient is at the pharmacy, call can be transferred to refill team.   1. Which medications need to be refilled? (please list name of each medication and dose if known) warfarin (COUMADIN ) 2 MG tablet  Take 2 tablets (4 mg total) by mouth daily. Adjust as directed by coumadin  clinic  spironolactone  (ALDACTONE ) 25 MG tablet  Take 1 tablet (25 mg total) by mouth daily.   2. Would you like to learn more about the convenience, safety, & potential cost savings by using the [facility]? No   3. Are you open to using the [facility] No   4. Which pharmacy/location (including street and city if local pharmacy) is medication to be sent to?[id number] - [place], [place] - [address]    5. Do they need a 30 day or 90 day supply? 90 Day Supply.  Pt is currently out of medication

## 2023-08-13 ENCOUNTER — Telehealth: Payer: Self-pay | Admitting: Cardiovascular Disease

## 2023-08-13 MED ORDER — SPIRONOLACTONE 25 MG PO TABS: 25.0000 mg | ORAL_TABLET | Freq: Every day | ORAL | 2 refills | Status: DC

## 2023-08-13 NOTE — Telephone Encounter (Signed)
 Pt's medication was sent to pt's pharmacy as requested. Confirmation received.

## 2023-08-13 NOTE — Telephone Encounter (Signed)
*  STAT* If patient is at the pharmacy, call can be transferred to refill team.   1. Which medications need to be refilled? (please list name of each medication and dose if known) spironolactone  (ALDACTONE ) 25 MG tablet    2. Would you like to learn more about the convenience, safety, & potential cost savings by using the [facility]?     3. Are you open to using the [facility] (Type [facility].  ).   4. Which pharmacy/location (including street and city if local pharmacy) is medication to be sent to? [id number] - [place], [place] - [address]    5. Do they need a 30 day or 90 day supply? 90 day Patient will be out of medication in two days.

## 2023-08-15 ENCOUNTER — Other Ambulatory Visit: Payer: Self-pay | Admitting: Cardiovascular Disease

## 2023-09-04 ENCOUNTER — Encounter

## 2023-09-04 ENCOUNTER — Ambulatory Visit: Attending: Cardiovascular Disease

## 2023-09-04 DIAGNOSIS — Z7901 Long term (current) use of anticoagulants: Secondary | ICD-10-CM

## 2023-09-04 DIAGNOSIS — Z952 Presence of prosthetic heart valve: Secondary | ICD-10-CM

## 2023-09-04 DIAGNOSIS — I4891 Unspecified atrial fibrillation: Secondary | ICD-10-CM

## 2023-09-04 LAB — POCT INR: INR: 2.3 (ref 2.0–3.0)

## 2023-09-04 NOTE — Patient Instructions (Signed)
 Continue 2 tablets daily, except 1 tablet every Tuesday and Thursday.  Eat 3 helpings of greens per week.    INR in 4 weeks.  [phone]

## 2023-10-02 ENCOUNTER — Ambulatory Visit: Attending: Cardiovascular Disease

## 2023-10-02 DIAGNOSIS — I4891 Unspecified atrial fibrillation: Secondary | ICD-10-CM | POA: Diagnosis not present

## 2023-10-02 DIAGNOSIS — Z7901 Long term (current) use of anticoagulants: Secondary | ICD-10-CM | POA: Diagnosis not present

## 2023-10-02 DIAGNOSIS — Z952 Presence of prosthetic heart valve: Secondary | ICD-10-CM | POA: Diagnosis not present

## 2023-10-02 LAB — POCT INR: INR: 2.5 (ref 2.0–3.0)

## 2023-10-02 NOTE — Patient Instructions (Signed)
 Continue 2 tablets daily, except 1 tablet every Tuesday and Thursday.  Eat 3 helpings of greens per week.    INR in 6 weeks.  [phone]

## 2023-11-04 ENCOUNTER — Encounter: Payer: Self-pay | Admitting: Cardiology

## 2023-11-04 ENCOUNTER — Ambulatory Visit: Attending: Cardiology | Admitting: Cardiology

## 2023-11-04 VITALS — BP 134/72 | HR 56 | Ht 61.0 in | Wt 143.6 lb

## 2023-11-04 DIAGNOSIS — I25118 Atherosclerotic heart disease of native coronary artery with other forms of angina pectoris: Secondary | ICD-10-CM

## 2023-11-04 DIAGNOSIS — I5032 Chronic diastolic (congestive) heart failure: Secondary | ICD-10-CM

## 2023-11-04 DIAGNOSIS — Z7901 Long term (current) use of anticoagulants: Secondary | ICD-10-CM | POA: Diagnosis not present

## 2023-11-04 DIAGNOSIS — I255 Ischemic cardiomyopathy: Secondary | ICD-10-CM

## 2023-11-04 DIAGNOSIS — Z952 Presence of prosthetic heart valve: Secondary | ICD-10-CM

## 2023-11-04 DIAGNOSIS — I10 Essential (primary) hypertension: Secondary | ICD-10-CM

## 2023-11-04 DIAGNOSIS — I340 Nonrheumatic mitral (valve) insufficiency: Secondary | ICD-10-CM

## 2023-11-04 DIAGNOSIS — J449 Chronic obstructive pulmonary disease, unspecified: Secondary | ICD-10-CM

## 2023-11-04 DIAGNOSIS — I480 Paroxysmal atrial fibrillation: Secondary | ICD-10-CM | POA: Diagnosis not present

## 2023-11-04 DIAGNOSIS — R739 Hyperglycemia, unspecified: Secondary | ICD-10-CM

## 2023-11-04 NOTE — Patient Instructions (Signed)
 Medication Instructions:  Your physician recommends that you continue on your current medications as directed. Please refer to the Current Medication list given to you today.   *If you need a refill on your cardiac medications before your next appointment, please call your pharmacy*  Lab Work: Your provider would like for you to have following labs drawn today CBC, CMP  TSH.   If you have labs (blood work) drawn today and your tests are completely normal, you will receive your results only by: MyChart Message (if you have MyChart) OR A paper copy in the mail If you have any lab test that is abnormal or we need to change your treatment, we will call you to review the results.  Testing/Procedures: No test ordered today   Follow-Up: At [facility], you and your health needs are our priority.  As part of our continuing mission to provide you with exceptional heart care, our providers are all part of one team.  This team includes your primary Cardiologist (physician) and Advanced Practice Providers or APPs (Physician Assistants and Nurse Practitioners) who all work together to provide you with the care you need, when you need it.  Your next appointment:   3 - 4 month(s)  Provider:   You may see Timothy Gollan, MD or one of the following Advanced Practice Providers on your designated Care Team:   Lonni Meager, NP Lesley Maffucci, PA-C Bernardino Bring, PA-C Cadence Cressona, PA-C Tylene Lunch, NP Barnie Hila, NP   We recommend signing up for the patient portal called MyChart.  Sign up information is provided on this After Visit Summary.  MyChart is used to connect with patients for Virtual Visits (Telemedicine).  Patients are able to view lab/test results, encounter notes, upcoming appointments, etc.  Non-urgent messages can be sent to your provider as well.   To learn more about what you can do with MyChart, go to [email or web address].

## 2023-11-04 NOTE — Progress Notes (Signed)
 Cardiology Office Note   Date:  11/04/2023  ID:  Tiernan Millikin, DOB 23-Jul-1945, MRN [id number] PCP: Patient, No Pcp Per  [facility] Providers Cardiologist:  Evalene Lunger, MD Cardiology APP:  Gerard Frederick, NP     History of Present Illness Penny Mccullough is a 78 y.o. 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   Penny Mccullough underwent CABG with LIMA-LAD in 1989 with subsequent NSTEMI requiring stenting of the ostial/mid LAD and noted to have an atretic LIMA 10/2020. More recently she has been seen for the evaluation of exertional dyspnea at which time echocardiogram showed an EF 45-50%, mild LVH, normal RV, severe MR. She was referred for TEE 04/2022 that confirmed severe MR with restricted posterior leaflet. R/LHC showed prominent v-waves in PCWP tracing and nonobstrutive CAD. There has been concern that the MR is rheumatic/inflammatory which is not not generally treated with mTEER. She was then referred to Dr. Maryjane for surgical evaluation and was felt to not be inoperable however was considered higher risk due to prior sternotomy. She saw Dr. Wendel 6/26 and was rather reluctant to undergo surgery as she was doing well with no significant symptoms and no pronounced murmur on exam. Plan was for repeat echo and exercise stress testing. Echo 7/25 was felt to be inconclusive to determine MR and TEE was ordered to observe mitral valve leaflets more closely.  She was considered not a candidate for and TEE on was felt best to undergo surgical MVR.  She was admitted to [facility] on 03/06/2023 and taken to the operating room at which time she underwent redo sternotomy with mitral valve replacement and tricuspid valve ring annuloplasty.  She tolerated the procedure well and was  taken to the surgical intensive care unit in stable condition.  She was weaned off of the ventilator without difficulty but did initially require BiPAP for recurrent hypoxemia.  PCCM team was consulted to assist with ICU management.  She initially required epinephrine  and milrinone  for inotropic support that was eventually able to be weaned off based on hemodynamic parameters.  She remained in chronic atrial fibrillation postoperatively.  She was to be transitioned back to oral anticoagulant with Coumadin . She initially had to be started on IV amiodarone  due to significant ventricular ectopy and was then transition to oral dosing.  It was also noted she had postoperative volume overload as expected following cardiac surgery but not considered to be clinically significant and was diuresed accordingly.  She was also noted to have preoperative UA showing a UTI and was treated postoperatively with antibiotics.  Chest tubes were removed on 1/17 without complication.  Coumadin  was started on 1/18.  On postop day 3 of inotropic support was able to be weaned off.  Coags are stable at 58.  Diuresis was continued for interstitial pulmonary edema.  By postop day 4 previous complaints of nausea had improved and her abdominal exam was benign.  She recurrent atrial fibrillation postoperatively with RVR.  Coumadin  was started with pharmacy team managing dose while she was inpatient.  She was also treated with both oral and intravenous amiodarone .  Oxygen requirement was significant postoperatively of 12 L HFNC.  She was diuresed for postoperative pulmonary edema likely caused by ministration of fluids leading to acute exacerbation of chronic HFrEF.  Repeat echocardiogram in 1/21 showed an EF of  25-30%, no pericardial effusion was seen and valves were functioning appropriately.  Advanced heart failure team was consulted for assistance with management.  Her Co. ox was had improved with diuresis and was eventually able to be  discontinued.  INR reached goal at 2.3 and she underwent DCCV 01/28.  She was then transferred to the progressive care unit.  She continued to progress and was maintaining sinus bradycardia in the 50s.  She was maximized on her current GDMT as tolerated.  Oxygen was weaned and she had continued to maintain good saturations.  Incisions were healing well without evidence of infection.  She was tolerating her diet and having regular bowel movements.  She was gradually increasing activities.  She was considered stable for discharge and was able to be discharged home on 03/22/2023.   She was last seen in clinic 05/06/2023 by Dr. Perla complaining of burning in her chest that radiates into the right side of her neck.  She was still recovering from surgery from 2 months ago with occasional pain with movement if she overdoes things in the house.  There were no medication changes that were made and further testing that was ordered at that time.  She returns to clinic today accompanied by her daughter.  She said overall she has been doing well from a cardiac perspective.  Unfortunately she did have 1 episode that she classified as a spell a few days ago where she had some lightheadedness and dizziness was feeling hot and flushed and when she cooled down and placed a cold rag all of her symptoms went away.  She stated during the event she had noticed that she had become more weak within her legs and felt as though she was unable to walk into the house.  She had been out running errands and had eaten.  Unfortunately she states that she drinks approximately 64 ounces of sweet tea per day as well.  She denies any palpitations, chest pain, or bleeding stating she has been compliant with her warfarin but notices an increased amount of bruising and extended length in healing time from any bruising that she has had.  She denies any hospitalizations or visits to the emergency department.  ROS: 10 point review of systems has been  reviewed and considered negative except ones are listed in the HPI  Studies Reviewed EKG Interpretation Date/Time:  Monday November 04 2023 08:39:06 EDT Ventricular Rate:  56 PR Interval:  166 QRS Duration:  80 QT Interval:  500 QTC Calculation: 482 R Axis:   10  Text Interpretation: Sinus bradycardia When compared with ECG of 06-May-2023 11:49, No significant change since last tracing Confirmed by Gerard Frederick ([id number]) on 11/04/2023 8:41:07 AM    Limited 2d echo 04/17/2023 1. Left ventricular ejection fraction, by estimation, is 45 to 50%. Left  ventricular ejection fraction by 3D volume is 46 %. The left ventricle has  mildly decreased function. The left ventricle demonstrates regional wall  motion abnormalities (mild  inferior wall hypokinesis). Left ventricular diastolic parameters were  normal. The average left ventricular global longitudinal strain is -17.1  %. The global longitudinal strain is normal.   2. Right ventricular systolic function is normal. The right ventricular  size is normal. Tricuspid regurgitation signal is inadequate for assessing  PA pressure.   3. The mitral valve has been repaired/replaced. Mild mitral valve  regurgitation. No evidence of mitral stenosis. The mean mitral valve  gradient is 3.0 mmHg.   4. The aortic valve is normal  in structure. Aortic valve regurgitation is  mild. No aortic stenosis is present.   5. The inferior vena cava is normal in size with greater than 50%  respiratory variability, suggesting right atrial pressure of 3 mmHg.   6. Left atrial size was moderately dilated.   2D echo 03/12/2023  1. Left ventricular ejection fraction, by estimation, is 25 to 30%. The  left ventricle has severely decreased function. The left ventricle  demonstrates global hypokinesis. There is mild concentric left ventricular  hypertrophy. Left ventricular diastolic   function could not be evaluated.   2. Right ventricular systolic function is  severely reduced. The right  ventricular size is moderately enlarged. There is moderately elevated  pulmonary artery systolic pressure. The estimated right ventricular  systolic pressure is 48.6 mmHg.   3. Left atrial size was severely dilated.   4. Right atrial size was mildly dilated.   5. The mitral valve has been repaired/replaced. Trivial mitral valve  regurgitation. No evidence of mitral stenosis. The mean mitral valve  gradient is 3.7 mmHg with average heart rate of 120 bpm. There is a 29 mm  [employer] Mosaic Bioprosthesis present in   the mitral position. Procedure Date: 03/06/23. Echo findings are  consistent with normal structure and function of the mitral valve  prosthesis.   6. 30mm MC3 Tricuspid ring implanted 03/06/23      . The tricuspid valve is has been repaired/replaced. The tricuspid  valve is status post repair with an annuloplasty ring.   7. The aortic valve is tricuspid. Aortic valve regurgitation is trivial.  Aortic valve sclerosis is present, with no evidence of aortic valve  stenosis.   8. The inferior vena cava is dilated in size with <50% respiratory  variability, suggesting right atrial pressure of 15 mmHg.    RHC 01/22/2023 Conclusions: Mildly elevated left and right heart filling pressures (mean RA 7 mmHg, PCWP 23 mmHg). Moderate pulmonary hypertension (PA 68/23, mean 38 mmHg, PVR 3.8 WU). Low normal Fick cardiac output/index (CO 3.9 L/min, CI 2.4 L/min/m^2).   Recommendations: Increase furosemide  to 40 mg PO daily. If no evidence of bleeding/vascular injury at catheterization site, apixaban  can be resumed tonight. Ongoing management of mitral regurgitation and pulmonary hypertension per Drs. Gollan and Weldner. Risk Assessment/Calculations  CHA2DS2-VASc Score = 5   This indicates a 7.2% annual risk of stroke. The patient's score is based upon: CHF History: 1 HTN History: 0 Diabetes History: 0 Stroke History: 0 Vascular Disease History: 1 Age  Score: 2 Gender Score: 1            Physical Exam VS:  BP 134/72 (BP Location: Left Arm, Patient Position: Sitting, Cuff Size: Normal)   Pulse (!) 56   Ht 5' 1 (1.549 m)   Wt 143 lb 9.6 oz (65.1 kg)   SpO2 99%   BMI 27.13 kg/m        Wt Readings from Last 3 Encounters:  11/04/23 143 lb 9.6 oz (65.1 kg)  05/16/23 123 lb (55.8 kg)  05/06/23 123 lb 8 oz (56 kg)    GEN: Well nourished, well developed in no acute distress NECK: No JVD; No carotid bruits CARDIAC: RRR, no murmurs, rubs, gallops RESPIRATORY:  Clear to auscultation without rales, wheezing or rhonchi  ABDOMEN: Soft, non-tender, non-distended EXTREMITIES:  No edema; No deformity   ASSESSMENT AND PLAN Severe mitral regurgitation/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  Limited echocardiogram revealed mild mitral regurgitation with appropriate valve function.  She will continue with annual surveillance studies.  She is continued on warfarin that is managed by Coumadin  clinic.  Paroxysmal atrial fibrillation which she has maintained sinus rhythm.  EKG today reveals sinus bradycardia with rate of 56 with no acute ischemic changes.  She developed atrial fibrillation in 2022 with cardioversion in 2023.  She had noted to be back in atrial fibrillation with RVR postoperatively and underwent successful DCCV 03/19/2023.  She is continued on warfarin for CHA2DS2-VASc of at least 5 for stroke prophylaxis.  She is also continued on amiodarone  200 mg daily.  She has been sent for updated lab CMP, CBC, and TSH today.  Chronic HFimpEF with history of acute biventricular and low output heart failure during prior hospitalization postoperatively requiring inotropic support.  Limited echocardiogram completed in February revealed an LVEF of 45-50% which was an improvement.  She continues to remain euvolemic on exam.  She is continued on GDMT of Jardiance  10 mg daily, furosemide  40 mg daily,  losartan  25 mg daily, and Aldactone  12.5 mg daily.  She is not on beta-blocker therapy due to baseline bradycardia when in sinus rhythm.  She has been sent for updated labs today.  Coronary artery disease status post CABG with subsequent remote stent placement.  She is continued on aspirin  will decrease to 3 days weekly due to worsening bruising with aspirin  and warfarin therapy and atorvastatin  80 mg daily with ezetimibe  10 mg daily.  EKG today reveals sinus bradycardia with no ischemic changes.  She continues to deny any anginal or anginal equivalents.  No further ischemic testing is needed at this time.  Hypertension with a first blood pressure 158/70 and a repeat of 134/72.  Blood pressures remain stable and sometimes a little low looking back through chart review.  She is continued on her current medication regimen.  Had previously discussed changing losartan  back to Entresto  but with recent symptoms of orthostasis this change has been deferred at this time.  She has been encouraged to continue to monitor pressure 1 to 2 hours postmedication administration as well.  Mixed hyperlipidemia with last direct LDL 44 which is under goal.  She has been continued on atorvastatin  80 mg daily and ezetimibe  10 mg daily.  COPD without acute exacerbation and has remained stable.       Dispo: Patient to return to clinic to see MD/APP in 3 to 4 months or sooner if needed for further evaluation.  Signed, Zelta Enfield, NP

## 2023-11-05 ENCOUNTER — Ambulatory Visit: Payer: Self-pay | Admitting: Cardiology

## 2023-11-05 LAB — CBC
Hematocrit: 39.5 % (ref 34.0–46.6)
Hemoglobin: 12.5 g/dL (ref 11.1–15.9)
MCH: 30.9 pg (ref 26.6–33.0)
MCHC: 31.6 g/dL (ref 31.5–35.7)
MCV: 98 fL — ABNORMAL HIGH (ref 79–97)
Platelets: 340 x10E3/uL (ref 150–450)
RBC: 4.05 x10E6/uL (ref 3.77–5.28)
RDW: 13.8 % (ref 11.7–15.4)
WBC: 7.6 x10E3/uL (ref 3.4–10.8)

## 2023-11-05 LAB — COMPREHENSIVE METABOLIC PANEL WITH GFR
ALT: 30 IU/L (ref 0–32)
AST: 24 IU/L (ref 0–40)
Albumin: 4.1 g/dL (ref 3.8–4.8)
Alkaline Phosphatase: 141 IU/L — ABNORMAL HIGH (ref 49–135)
BUN/Creatinine Ratio: 16 (ref 12–28)
BUN: 19 mg/dL (ref 8–27)
Bilirubin Total: 0.6 mg/dL (ref 0.0–1.2)
CO2: 22 mmol/L (ref 20–29)
Calcium: 8.9 mg/dL (ref 8.7–10.3)
Chloride: 105 mmol/L (ref 96–106)
Creatinine, Ser: 1.18 mg/dL — ABNORMAL HIGH (ref 0.57–1.00)
Globulin, Total: 2.2 g/dL (ref 1.5–4.5)
Glucose: 92 mg/dL (ref 70–99)
Potassium: 5 mmol/L (ref 3.5–5.2)
Sodium: 143 mmol/L (ref 134–144)
Total Protein: 6.3 g/dL (ref 6.0–8.5)
eGFR: 48 mL/min/1.73 — ABNORMAL LOW (ref >59)

## 2023-11-05 LAB — TSH: TSH: 5.6 u[IU]/mL — ABNORMAL HIGH (ref 0.450–4.500)

## 2023-11-05 NOTE — Progress Notes (Signed)
 Blood counts are stable at 12.5.  White blood count is not elevated concerning for infection.  Serum creatinine slightly elevated.  Recommend increasing water intake (not sweet tea), TSH elevated at 5.60.  Please forward labs to PCP as well.  No medication changes needed at this time.

## 2023-11-13 ENCOUNTER — Ambulatory Visit: Attending: Cardiovascular Disease

## 2023-11-13 DIAGNOSIS — Z7901 Long term (current) use of anticoagulants: Secondary | ICD-10-CM

## 2023-11-13 DIAGNOSIS — Z952 Presence of prosthetic heart valve: Secondary | ICD-10-CM

## 2023-11-13 DIAGNOSIS — I4891 Unspecified atrial fibrillation: Secondary | ICD-10-CM | POA: Diagnosis not present

## 2023-11-13 LAB — POCT INR: INR: 2.6 (ref 2.0–3.0)

## 2023-11-13 NOTE — Patient Instructions (Signed)
 Continue 2 tablets daily, except 1 tablet every Tuesday and Thursday.  Eat 3 helpings of greens per week.    INR in 6 weeks.  [phone]

## 2023-12-25 ENCOUNTER — Ambulatory Visit: Attending: Cardiovascular Disease

## 2023-12-25 DIAGNOSIS — I4891 Unspecified atrial fibrillation: Secondary | ICD-10-CM

## 2023-12-25 DIAGNOSIS — Z952 Presence of prosthetic heart valve: Secondary | ICD-10-CM

## 2023-12-25 DIAGNOSIS — Z7901 Long term (current) use of anticoagulants: Secondary | ICD-10-CM

## 2023-12-25 LAB — POCT INR: INR: 2.4 (ref 2.0–3.0)

## 2023-12-25 NOTE — Patient Instructions (Signed)
 Continue 2 tablets daily, except 1 tablet every Tuesday and Thursday.  Eat 3 helpings of greens per week.    INR in 6 weeks.  [phone]

## 2024-01-31 ENCOUNTER — Other Ambulatory Visit: Payer: Self-pay | Admitting: Cardiovascular Disease

## 2024-01-31 DIAGNOSIS — I4891 Unspecified atrial fibrillation: Secondary | ICD-10-CM

## 2024-02-05 ENCOUNTER — Ambulatory Visit: Attending: Cardiovascular Disease

## 2024-02-05 DIAGNOSIS — Z7901 Long term (current) use of anticoagulants: Secondary | ICD-10-CM

## 2024-02-05 DIAGNOSIS — I4891 Unspecified atrial fibrillation: Secondary | ICD-10-CM | POA: Diagnosis not present

## 2024-02-05 DIAGNOSIS — Z952 Presence of prosthetic heart valve: Secondary | ICD-10-CM

## 2024-02-05 LAB — POCT INR: INR: 2.7 (ref 2.0–3.0)

## 2024-02-05 NOTE — Patient Instructions (Signed)
 Continue 2 tablets daily, except 1 tablet every Tuesday and Thursday.  Eat 3 helpings of greens per week.    INR in 6 weeks.  [phone]

## 2024-02-11 NOTE — Progress Notes (Signed)
"   INR 2.7 Please see anticoagulation encounter Continue 2 tablets daily, except 1 tablet every Tuesday and Thursday.  Eat 3 helpings of greens per week.    INR in 6 weeks.  [id number]  "

## 2024-02-22 NOTE — Progress Notes (Unsigned)
 " Cardiology Office Note   Date:  02/25/2024  ID:  Penny Mccullough, DOB 1945-05-24, MRN [id number] PCP: Patient, No Pcp Per  [facility] Providers Cardiologist:  Evalene Lunger, MD Cardiology APP:  Gerard Frederick, NP     History of Present Illness Penny Mccullough is a 79 y.o. 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   Ms. Snook underwent CABG with LIMA-LAD in 1989 with subsequent NSTEMI requiring stenting of the ostial/mid LAD and noted to have an atretic LIMA 10/2020. More recently she has been seen for the evaluation of exertional dyspnea at which time echocardiogram showed an EF 45-50%, mild LVH, normal RV, severe MR. She was referred for TEE 04/2022 that confirmed severe MR with restricted posterior leaflet. R/LHC showed prominent v-waves in PCWP tracing and nonobstrutive CAD. There has been concern that the MR is rheumatic/inflammatory which is not not generally treated with mTEER. She was then referred to Dr. Maryjane for surgical evaluation and was felt to not be inoperable however was considered higher risk due to prior sternotomy. She saw Dr. Wendel 6/26 and was rather reluctant to undergo surgery as she was doing well with no significant symptoms and no pronounced murmur on exam. Plan was for repeat echo and exercise stress testing. Echo 7/25 was felt to be inconclusive to determine MR and TEE was ordered to observe mitral valve leaflets more closely.  She was considered not a candidate for and TEE on was felt best to undergo surgical MVR.  She was admitted to [facility] on 03/06/2023 and taken to the operating room at which time she underwent redo sternotomy with mitral valve replacement and tricuspid valve ring annuloplasty.  She tolerated the procedure well and was  taken to the surgical intensive care unit in stable condition.  She was weaned off of the ventilator without difficulty but did initially require BiPAP for recurrent hypoxemia.  PCCM team was consulted to assist with ICU management.  She initially required epinephrine  and milrinone  for inotropic support that was eventually able to be weaned off based on hemodynamic parameters.  She remained in chronic atrial fibrillation postoperatively.  She was to be transitioned back to oral anticoagulant with Coumadin . She initially had to be started on IV amiodarone  due to significant ventricular ectopy and was then transition to oral dosing.  It was also noted she had postoperative volume overload as expected following cardiac surgery but not considered to be clinically significant and was diuresed accordingly.  She was also noted to have preoperative UA showing a UTI and was treated postoperatively with antibiotics.  Chest tubes were removed on 1/17 without complication.  Coumadin  was started on 1/18.  On postop day 3 of inotropic support was able to be weaned off.  Coags are stable at 58.  Diuresis was continued for interstitial pulmonary edema.  By postop day 4 previous complaints of nausea had improved and her abdominal exam was benign.  She recurrent atrial fibrillation postoperatively with RVR.  Coumadin  was started with pharmacy team managing dose while she was inpatient.  She was also treated with both oral and intravenous amiodarone .  Oxygen requirement was significant postoperatively of 12 L HFNC.  She was diuresed for postoperative pulmonary edema likely caused by ministration of fluids leading to acute exacerbation of chronic HFrEF.  Repeat echocardiogram in 1/21 showed an  EF of 25-30%, no pericardial effusion was seen and valves were functioning appropriately.  Advanced heart failure team was consulted for assistance with management.  Her Co. ox was had improved with diuresis and was eventually able to be  discontinued.  INR reached goal at 2.3 and she underwent DCCV 01/28.  She was then transferred to the progressive care unit.  She continued to progress and was maintaining sinus bradycardia in the 50s.  She was maximized on her current GDMT as tolerated.  Oxygen was weaned and she had continued to maintain good saturations.  Incisions were healing well without evidence of infection.  She was tolerating her diet and having regular bowel movements.  She was gradually increasing activities.  She was considered stable for discharge and was able to be discharged home on 03/22/2023.  She was last seen in clinic 11/04/2023 accompanied by her daughter.  Overall she states she was doing well from a cardiac perspective.  She states she did have 1 episode that she classified as a spell a few days prior where she has lightheadedness and dizziness was feeling hot and flushed but when she cold rag on her head all of her symptoms went away.  She was sent for updated labs.  There were no medication changes that were made or further testing that was ordered.   She returns to clinic today accompanied by her daughter.  Overall she states that she has been doing well from a cardiac perspective.  She continues to have exertional shortness of breath that she feels as though it is stable and is unchanged over the past 6 months.  Her daughter feels as though it has slightly improved.  She does do minimal activity around the house of walking to the mailbox and back or out to her building and back.  States that she does do a lot of steps within the house.  She is concerned today as her weight is up 4 pounds over the past several months.  At her last visit she was advised that she can decrease her aspirin  to 3 days a week but after further discussion it appears she may have reduced her potassium versus her aspirin .  She denies any chest pain or bleeding she states that she has been compliant with her warfarin therapy and her INR has been  within range.  She denies any hospitalizations or visits to the emergency department.  ROS: 10 point review of system has been reviewed and considered negative the exception was been listed in the HPI  Studies Reviewed EKG Interpretation Date/Time:  Tuesday February 25 2024 08:57:08 EST Ventricular Rate:  59 PR Interval:  162 QRS Duration:  82 QT Interval:  496 QTC Calculation: 491 R Axis:   10  Text Interpretation: Sinus bradycardia When compared with ECG of 04-Nov-2023 08:39, No significant change was found Confirmed by Gerard Frederick ([id number]) on 02/25/2024 8:59:16 AM    Limited 2d echo 04/17/2023 1. Left ventricular ejection fraction, by estimation, is 45 to 50%. Left  ventricular ejection fraction by 3D volume is 46 %. The left ventricle has  mildly decreased function. The left ventricle demonstrates regional wall  motion abnormalities (mild  inferior wall hypokinesis). Left ventricular diastolic parameters were  normal. The average left ventricular global longitudinal strain is -17.1  %. The global longitudinal strain is normal.   2. Right ventricular systolic function is normal. The right ventricular  size is normal. Tricuspid regurgitation signal is inadequate for assessing  PA pressure.  3. The mitral valve has been repaired/replaced. Mild mitral valve  regurgitation. No evidence of mitral stenosis. The mean mitral valve  gradient is 3.0 mmHg.   4. The aortic valve is normal in structure. Aortic valve regurgitation is  mild. No aortic stenosis is present.   5. The inferior vena cava is normal in size with greater than 50%  respiratory variability, suggesting right atrial pressure of 3 mmHg.   6. Left atrial size was moderately dilated.    2D echo 03/12/2023  1. Left ventricular ejection fraction, by estimation, is 25 to 30%. The  left ventricle has severely decreased function. The left ventricle  demonstrates global hypokinesis. There is mild concentric left ventricular   hypertrophy. Left ventricular diastolic   function could not be evaluated.   2. Right ventricular systolic function is severely reduced. The right  ventricular size is moderately enlarged. There is moderately elevated  pulmonary artery systolic pressure. The estimated right ventricular  systolic pressure is 48.6 mmHg.   3. Left atrial size was severely dilated.   4. Right atrial size was mildly dilated.   5. The mitral valve has been repaired/replaced. Trivial mitral valve  regurgitation. No evidence of mitral stenosis. The mean mitral valve  gradient is 3.7 mmHg with average heart rate of 120 bpm. There is a 29 mm  [employer] Mosaic Bioprosthesis present in   the mitral position. Procedure Date: 03/06/23. Echo findings are  consistent with normal structure and function of the mitral valve  prosthesis.   6. 30mm MC3 Tricuspid ring implanted 03/06/23      . The tricuspid valve is has been repaired/replaced. The tricuspid  valve is status post repair with an annuloplasty ring.   7. The aortic valve is tricuspid. Aortic valve regurgitation is trivial.  Aortic valve sclerosis is present, with no evidence of aortic valve  stenosis.   8. The inferior vena cava is dilated in size with <50% respiratory  variability, suggesting right atrial pressure of 15 mmHg.    RHC 01/22/2023 Conclusions: Mildly elevated left and right heart filling pressures (mean RA 7 mmHg, PCWP 23 mmHg). Moderate pulmonary hypertension (PA 68/23, mean 38 mmHg, PVR 3.8 WU). Low normal Fick cardiac output/index (CO 3.9 L/min, CI 2.4 L/min/m^2).   Recommendations: Increase furosemide  to 40 mg PO daily. If no evidence of bleeding/vascular injury at catheterization site, apixaban  can be resumed tonight. Ongoing management of mitral regurgitation and pulmonary hypertension per Drs. Gollan and Weldner.  Risk Assessment/Calculations  CHA2DS2-VASc Score = 5   This indicates a 7.2% annual risk of stroke. The patient's score  is based upon: CHF History: 1 HTN History: 0 Diabetes History: 0 Stroke History: 0 Vascular Disease History: 1 Age Score: 2 Gender Score: 1    HYPERTENSION CONTROL Vitals:   02/25/24 0851 02/25/24 0908  BP: (!) 148/68 (!) 140/78    The patient's blood pressure is elevated above target today.  In order to address the patient's elevated BP: Blood pressure will be monitored at home to determine if medication changes need to be made. (Patient has not had medications as of yet today)          Physical Exam VS:  BP (!) 140/78 (BP Location: Left Arm, Patient Position: Sitting, Cuff Size: Normal)   Pulse (!) 59   Ht 5' 1 (1.549 m)   Wt 147 lb 3.2 oz (66.8 kg)   SpO2 98%   BMI 27.81 kg/m        Wt Readings from Last  3 Encounters:  02/25/24 147 lb 3.2 oz (66.8 kg)  11/04/23 143 lb 9.6 oz (65.1 kg)  05/16/23 123 lb (55.8 kg)    GEN: Well nourished, well developed in no acute distress NECK: No JVD; No carotid bruits CARDIAC: RRR, no murmurs, rubs, gallops RESPIRATORY:  Clear to auscultation without rales, wheezing or rhonchi  ABDOMEN: Soft, non-tender, non-distended EXTREMITIES:  No edema; No deformity   ASSESSMENT AND PLAN Severe mitral regurgitation/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  Last echocardiogram revealed mild mitral regurgitation with appropriate valve function.  She will continue with annual surveillance studies.  She is also continued on warfarin that is managed by Coumadin  clinic.  Her next echocardiogram will be ordered on return.  Paroxysmal atrial fibrillation but she has maintained sinus rhythm.  EKG today reveals sinus pericardia with a rate of 59 with no significant changes from prior studies.  She developed atrial fibrillation back in 2022 with cardioversion in 2023.  Went back in atrial fibrillation with RVR postoperatively underwent successful DCCV 03/19/2023.  She is continued on warfarin for  CHA2DS2-VASc of at least 5 for stroke prophylaxis.  She is also continued on amiodarone  200 mg daily.  She recently had a thyroid  and CMP checked at her last visit which were stable.  With the changes in her medications she has been sent for an updated BMP today to evaluate potassium with changes in her medication.  Chronic HFimpEF with a history of acute biventricular low output heart failure during prior hospitalization postoperatively requiring inotropic support.  LVEF 45-50% which is an improvement.  She continues to remain euvolemic on exam.  She is continued on GDMT of Jardiance  10 mg daily, furosemide  40 mg daily, losartan  25 mg daily, and Aldactone  12.5 mg daily she is not currently on beta-blocker therapy due to baseline bradycardia.  Coronary artery disease status post CABG with subsequent remote stent placement.  She continues to remain chest pain-free.  EKG today reveals no ischemic changes.  She is continued on aspirin  3 days a week warfarin and atorvastatin  along with ezetimibe .  No further ischemic evaluation is needed at this time.  Hypertension with repeat blood show pressure of 140/78.  Unfortunately patient has not had any of her current medications this morning.  She has been encouraged to continue to monitor pressures 1 to 2 hours postmedication administration at home as well.  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  She is continued on atorvastatin  80 mg daily and ezetimibe  10 mg daily as her LDL continues to remain at goal.  COPD with chronic shortness of breath without acute exacerbation has remained stable.  Shortness of breath is likely multifactorial with lack of activity.       Dispo: Patient to return to clinic see MD/APP in 6 months or sooner if needed for further evaluation.  Signed, Sonnie Bias, NP   "

## 2024-02-25 ENCOUNTER — Ambulatory Visit: Attending: Cardiology | Admitting: Cardiology

## 2024-02-25 ENCOUNTER — Encounter: Payer: Self-pay | Admitting: Cardiology

## 2024-02-25 VITALS — BP 140/78 | HR 59 | Ht 61.0 in | Wt 147.2 lb

## 2024-02-25 DIAGNOSIS — I25118 Atherosclerotic heart disease of native coronary artery with other forms of angina pectoris: Secondary | ICD-10-CM

## 2024-02-25 DIAGNOSIS — R0602 Shortness of breath: Secondary | ICD-10-CM | POA: Diagnosis not present

## 2024-02-25 DIAGNOSIS — I480 Paroxysmal atrial fibrillation: Secondary | ICD-10-CM

## 2024-02-25 DIAGNOSIS — Z952 Presence of prosthetic heart valve: Secondary | ICD-10-CM | POA: Diagnosis not present

## 2024-02-25 DIAGNOSIS — J449 Chronic obstructive pulmonary disease, unspecified: Secondary | ICD-10-CM

## 2024-02-25 DIAGNOSIS — I10 Essential (primary) hypertension: Secondary | ICD-10-CM

## 2024-02-25 DIAGNOSIS — E782 Mixed hyperlipidemia: Secondary | ICD-10-CM | POA: Diagnosis not present

## 2024-02-25 DIAGNOSIS — I5020 Unspecified systolic (congestive) heart failure: Secondary | ICD-10-CM

## 2024-02-25 DIAGNOSIS — Z79899 Other long term (current) drug therapy: Secondary | ICD-10-CM | POA: Diagnosis not present

## 2024-02-25 DIAGNOSIS — I340 Nonrheumatic mitral (valve) insufficiency: Secondary | ICD-10-CM

## 2024-02-25 MED ORDER — ASPIRIN 81 MG PO CHEW: 81.0000 mg | CHEWABLE_TABLET | ORAL | Status: AC

## 2024-02-25 MED ORDER — SPIRONOLACTONE 25 MG PO TABS: 12.5000 mg | ORAL_TABLET | Freq: Every day | ORAL | Status: AC

## 2024-02-25 NOTE — Patient Instructions (Signed)
 Medication Instructions:  Your physician recommends the following medication changes.  DECREASE: Aspirin  to 3 days a week Spironolactone  to 12.5 mg daily  *If you need a refill on your cardiac medications before your next appointment, please call your pharmacy*  Lab Work: Your provider would like for you to have following labs drawn today BMP.   If you have labs (blood work) drawn today and your tests are completely normal, you will receive your results only by: MyChart Message (if you have MyChart) OR A paper copy in the mail If you have any lab test that is abnormal or we need to change your treatment, we will call you to review the results.  Testing/Procedures: No test ordered today   Follow-Up: At [facility], you and your health needs are our priority.  As part of our continuing mission to provide you with exceptional heart care, our providers are all part of one team.  This team includes your primary Cardiologist (physician) and Advanced Practice Providers or APPs (Physician Assistants and Nurse Practitioners) who all work together to provide you with the care you need, when you need it.  Your next appointment:   6 month(s)  Provider:   You may see Timothy Gollan, MD or one of the following Advanced Practice Providers on your designated Care Team:   Tylene Lunch, NP  We recommend signing up for the patient portal called MyChart.  Sign up information is provided on this After Visit Summary.  MyChart is used to connect with patients for Virtual Visits (Telemedicine).  Patients are able to view lab/test results, encounter notes, upcoming appointments, etc.  Non-urgent messages can be sent to your provider as well.   To learn more about what you can do with MyChart, go to [email or web address].

## 2024-02-26 ENCOUNTER — Ambulatory Visit: Payer: Self-pay | Admitting: Cardiology

## 2024-02-26 DIAGNOSIS — Z79899 Other long term (current) drug therapy: Secondary | ICD-10-CM

## 2024-02-26 LAB — BASIC METABOLIC PANEL WITH GFR
BUN/Creatinine Ratio: 17 (ref 12–28)
BUN: 23 mg/dL (ref 8–27)
CO2: 22 mmol/L (ref 20–29)
Calcium: 9.2 mg/dL (ref 8.7–10.3)
Chloride: 107 mmol/L — ABNORMAL HIGH (ref 96–106)
Creatinine, Ser: 1.34 mg/dL — ABNORMAL HIGH (ref 0.57–1.00)
Glucose: 100 mg/dL — ABNORMAL HIGH (ref 70–99)
Potassium: 5.1 mmol/L (ref 3.5–5.2)
Sodium: 142 mmol/L (ref 134–144)
eGFR: 41 mL/min/1.73 — ABNORMAL LOW

## 2024-02-26 NOTE — Progress Notes (Signed)
 Kidney function slightly elevated from baseline.  Recommend decreasing furosemide  to 3 days weekly.  Place potassium supplements on hold for now.  Repeat BMP in 2 weeks.

## 2024-03-18 ENCOUNTER — Ambulatory Visit

## 2024-03-25 ENCOUNTER — Ambulatory Visit

## 2024-03-25 ENCOUNTER — Other Ambulatory Visit: Payer: Self-pay

## 2024-03-25 ENCOUNTER — Other Ambulatory Visit: Payer: Self-pay | Admitting: Emergency Medicine

## 2024-03-25 DIAGNOSIS — I4891 Unspecified atrial fibrillation: Secondary | ICD-10-CM

## 2024-03-25 DIAGNOSIS — Z7901 Long term (current) use of anticoagulants: Secondary | ICD-10-CM | POA: Diagnosis not present

## 2024-03-25 DIAGNOSIS — Z952 Presence of prosthetic heart valve: Secondary | ICD-10-CM

## 2024-03-25 DIAGNOSIS — Z79899 Other long term (current) drug therapy: Secondary | ICD-10-CM

## 2024-03-25 LAB — POCT INR: INR: 2.4 (ref 2.0–3.0)

## 2024-03-25 NOTE — Patient Instructions (Signed)
 Continue 2 tablets daily, except 1 tablet every Tuesday and Thursday.  Eat 3 helpings of greens per week.    INR in 6 weeks.  [phone]

## 2024-03-26 LAB — BASIC METABOLIC PANEL WITH GFR
BUN/Creatinine Ratio: 14 (ref 12–28)
BUN: 15 mg/dL (ref 8–27)
CO2: 21 mmol/L (ref 20–29)
Calcium: 9.2 mg/dL (ref 8.7–10.3)
Chloride: 105 mmol/L (ref 96–106)
Creatinine, Ser: 1.05 mg/dL — ABNORMAL HIGH (ref 0.57–1.00)
Glucose: 95 mg/dL (ref 70–99)
Potassium: 4.9 mmol/L (ref 3.5–5.2)
Sodium: 141 mmol/L (ref 134–144)
eGFR: 55 mL/min/{1.73_m2} — ABNORMAL LOW

## 2024-03-27 ENCOUNTER — Ambulatory Visit: Payer: Self-pay | Admitting: Cardiology

## 2024-03-27 NOTE — Progress Notes (Signed)
 Kidney function has improved.  Continue current medication regimen without changes needed at this time.

## 2024-03-27 NOTE — Progress Notes (Signed)
 She can restart them on an as needed basis but not to take them more than 3 times per week.  Continue conservative therapy of limiting sodium intake, elevating her extremities, and knee high compression stockings as well.

## 2024-05-06 ENCOUNTER — Ambulatory Visit
